# Patient Record
Sex: Male | Born: 1970 | Race: White | Hispanic: No | Marital: Married | Smoking: Former smoker
Health system: Southern US, Community
[De-identification: ages and names within clinical notes are randomized; demographics above are authoritative.]

## PROBLEM LIST (undated history)

## (undated) DIAGNOSIS — Z720 Tobacco use: Secondary | ICD-10-CM

## (undated) DIAGNOSIS — I10 Essential (primary) hypertension: Secondary | ICD-10-CM

## (undated) DIAGNOSIS — C801 Malignant (primary) neoplasm, unspecified: Secondary | ICD-10-CM

## (undated) DIAGNOSIS — R569 Unspecified convulsions: Secondary | ICD-10-CM

## (undated) DIAGNOSIS — J45909 Unspecified asthma, uncomplicated: Secondary | ICD-10-CM

## (undated) DIAGNOSIS — J449 Chronic obstructive pulmonary disease, unspecified: Secondary | ICD-10-CM

## (undated) DIAGNOSIS — E119 Type 2 diabetes mellitus without complications: Secondary | ICD-10-CM

---

## 2009-01-24 ENCOUNTER — Ambulatory Visit: Payer: Self-pay | Admitting: Internal Medicine

## 2009-02-13 ENCOUNTER — Emergency Department: Payer: Self-pay | Admitting: Emergency Medicine

## 2009-02-16 ENCOUNTER — Inpatient Hospital Stay: Payer: Self-pay | Admitting: Specialist

## 2009-03-26 ENCOUNTER — Ambulatory Visit: Payer: Self-pay | Admitting: Internal Medicine

## 2009-04-17 ENCOUNTER — Ambulatory Visit: Payer: Self-pay | Admitting: Internal Medicine

## 2009-04-26 ENCOUNTER — Ambulatory Visit: Payer: Self-pay | Admitting: Internal Medicine

## 2009-05-04 ENCOUNTER — Ambulatory Visit: Payer: Self-pay | Admitting: Specialist

## 2009-05-26 ENCOUNTER — Ambulatory Visit: Payer: Self-pay | Admitting: Internal Medicine

## 2009-06-26 ENCOUNTER — Ambulatory Visit: Payer: Self-pay | Admitting: Internal Medicine

## 2009-07-17 ENCOUNTER — Ambulatory Visit: Payer: Self-pay | Admitting: Internal Medicine

## 2009-07-26 ENCOUNTER — Ambulatory Visit: Payer: Self-pay | Admitting: Internal Medicine

## 2009-08-26 ENCOUNTER — Ambulatory Visit: Payer: Self-pay | Admitting: Internal Medicine

## 2009-09-18 ENCOUNTER — Ambulatory Visit: Payer: Self-pay | Admitting: Internal Medicine

## 2009-09-26 ENCOUNTER — Ambulatory Visit: Payer: Self-pay | Admitting: Internal Medicine

## 2009-11-17 ENCOUNTER — Ambulatory Visit: Payer: Self-pay | Admitting: Internal Medicine

## 2009-11-24 ENCOUNTER — Ambulatory Visit: Payer: Self-pay | Admitting: Internal Medicine

## 2010-02-23 ENCOUNTER — Ambulatory Visit: Payer: Self-pay | Admitting: Internal Medicine

## 2010-03-05 ENCOUNTER — Ambulatory Visit: Payer: Self-pay | Admitting: Internal Medicine

## 2010-03-26 ENCOUNTER — Ambulatory Visit: Payer: Self-pay | Admitting: Internal Medicine

## 2010-04-26 ENCOUNTER — Ambulatory Visit: Payer: Self-pay | Admitting: Internal Medicine

## 2010-05-07 ENCOUNTER — Ambulatory Visit: Payer: Self-pay | Admitting: Internal Medicine

## 2010-05-26 ENCOUNTER — Ambulatory Visit: Payer: Self-pay | Admitting: Internal Medicine

## 2010-06-28 ENCOUNTER — Emergency Department: Payer: Self-pay | Admitting: Emergency Medicine

## 2010-08-09 ENCOUNTER — Ambulatory Visit: Payer: Self-pay | Admitting: Internal Medicine

## 2010-08-22 ENCOUNTER — Emergency Department: Payer: Self-pay | Admitting: Emergency Medicine

## 2010-08-26 ENCOUNTER — Ambulatory Visit: Payer: Self-pay | Admitting: Internal Medicine

## 2010-10-10 ENCOUNTER — Ambulatory Visit: Payer: Self-pay | Admitting: Internal Medicine

## 2010-10-25 ENCOUNTER — Ambulatory Visit: Payer: Self-pay | Admitting: Internal Medicine

## 2011-01-17 ENCOUNTER — Ambulatory Visit: Payer: Self-pay | Admitting: Internal Medicine

## 2011-01-25 ENCOUNTER — Ambulatory Visit: Payer: Self-pay | Admitting: Internal Medicine

## 2011-05-09 ENCOUNTER — Ambulatory Visit: Payer: Self-pay | Admitting: Internal Medicine

## 2011-05-27 ENCOUNTER — Ambulatory Visit: Payer: Self-pay | Admitting: Internal Medicine

## 2011-09-13 ENCOUNTER — Ambulatory Visit: Payer: Self-pay | Admitting: Internal Medicine

## 2011-09-13 LAB — CBC CANCER CENTER
Basophil %: 0.4 %
Eosinophil #: 0.1 x10 3/mm (ref 0.0–0.7)
Eosinophil %: 1.7 %
HCT: 47.4 % (ref 40.0–52.0)
HGB: 16.3 g/dL (ref 13.0–18.0)
Lymphocyte #: 2.5 x10 3/mm (ref 1.0–3.6)
Lymphocyte %: 30 %
MCH: 29.8 pg (ref 26.0–34.0)
Monocyte #: 0.6 x10 3/mm (ref 0.0–0.7)
Monocyte %: 7.3 %
Neutrophil %: 60.6 %
RBC: 5.46 10*6/uL (ref 4.40–5.90)
WBC: 8.3 x10 3/mm (ref 3.8–10.6)

## 2011-09-13 LAB — LACTATE DEHYDROGENASE: LDH: 140 U/L (ref 87–241)

## 2011-09-13 LAB — CREATININE, SERUM
Creatinine: 0.92 mg/dL (ref 0.60–1.30)
EGFR (African American): >60

## 2011-09-27 ENCOUNTER — Ambulatory Visit: Payer: Self-pay | Admitting: Internal Medicine

## 2012-07-05 ENCOUNTER — Inpatient Hospital Stay: Payer: Self-pay | Admitting: Internal Medicine

## 2012-07-05 LAB — COMPREHENSIVE METABOLIC PANEL
Albumin: 3.9 g/dL (ref 3.4–5.0)
Alkaline Phosphatase: 123 U/L (ref 50–136)
Anion Gap: 11 (ref 7–16)
Bilirubin,Total: 0.3 mg/dL (ref 0.2–1.0)
Calcium, Total: 9.7 mg/dL (ref 8.5–10.1)
Chloride: 97 mmol/L — ABNORMAL LOW (ref 98–107)
Co2: 26 mmol/L (ref 21–32)
Creatinine: 0.92 mg/dL (ref 0.60–1.30)
EGFR (African American): >60
EGFR (Non-African Amer.): >60
Glucose: 377 mg/dL — ABNORMAL HIGH (ref 65–99)
Osmolality: 283 (ref 275–301)
Potassium: 4 mmol/L (ref 3.5–5.1)
SGOT(AST): 25 U/L (ref 15–37)
SGPT (ALT): 36 U/L (ref 12–78)
Sodium: 134 mmol/L — ABNORMAL LOW (ref 136–145)

## 2012-07-05 LAB — URINALYSIS, COMPLETE
Bacteria: NONE SEEN
Bilirubin,UR: NEGATIVE
Blood: NEGATIVE
Glucose,UR: >=500 mg/dL (ref 0–75)
Leukocyte Esterase: NEGATIVE
RBC,UR: <1 /HPF (ref 0–5)
Squamous Epithelial: <1

## 2012-07-05 LAB — CBC
HGB: 18.4 g/dL — ABNORMAL HIGH (ref 13.0–18.0)
MCH: 29.8 pg (ref 26.0–34.0)
MCHC: 34.1 g/dL (ref 32.0–36.0)
MCV: 87 fL (ref 80–100)
Platelet: 230 10*3/uL (ref 150–440)
RBC: 6.19 10*6/uL — ABNORMAL HIGH (ref 4.40–5.90)

## 2012-07-05 LAB — LIPASE, BLOOD: Lipase: >3000 U/L (ref 73–393)

## 2012-07-06 LAB — CBC WITH DIFFERENTIAL/PLATELET
Basophil #: 0.1 10*3/uL (ref 0.0–0.1)
Basophil %: 0.6 %
Eosinophil %: 2.5 %
HGB: 16 g/dL (ref 13.0–18.0)
Lymphocyte %: 26.6 %
MCH: 30.3 pg (ref 26.0–34.0)
MCV: 87 fL (ref 80–100)
Monocyte #: 0.9 x10 3/mm (ref 0.2–1.0)
Neutrophil #: 7.6 10*3/uL — ABNORMAL HIGH (ref 1.4–6.5)
Neutrophil %: 62.8 %
RBC: 5.27 10*6/uL (ref 4.40–5.90)
RDW: 12.9 % (ref 11.5–14.5)
WBC: 12.1 10*3/uL — ABNORMAL HIGH (ref 3.8–10.6)

## 2012-07-06 LAB — COMPREHENSIVE METABOLIC PANEL
Albumin: 3.2 g/dL — ABNORMAL LOW (ref 3.4–5.0)
Alkaline Phosphatase: 96 U/L (ref 50–136)
Anion Gap: 8 (ref 7–16)
Co2: 25 mmol/L (ref 21–32)
Creatinine: 0.62 mg/dL (ref 0.60–1.30)
Glucose: 143 mg/dL — ABNORMAL HIGH (ref 65–99)
Osmolality: 276 (ref 275–301)
SGOT(AST): 31 U/L (ref 15–37)
SGPT (ALT): 30 U/L (ref 12–78)
Sodium: 137 mmol/L (ref 136–145)
Total Protein: 7.1 g/dL (ref 6.4–8.2)

## 2012-07-06 LAB — LIPASE, BLOOD: Lipase: 2943 U/L — ABNORMAL HIGH (ref 73–393)

## 2012-07-06 LAB — LIPID PANEL
Ldl Cholesterol, Calc: 117 mg/dL — ABNORMAL HIGH (ref 0–100)
Triglycerides: 398 mg/dL — ABNORMAL HIGH (ref 0–200)

## 2012-07-06 LAB — AMYLASE: Amylase: 225 U/L — ABNORMAL HIGH (ref 25–115)

## 2012-07-07 LAB — CBC WITH DIFFERENTIAL/PLATELET
Basophil #: 0.1 10*3/uL (ref 0.0–0.1)
Eosinophil #: 0.2 10*3/uL (ref 0.0–0.7)
HCT: 44.5 % (ref 40.0–52.0)
Lymphocyte #: 3.2 10*3/uL (ref 1.0–3.6)
Lymphocyte %: 33.5 %
MCHC: 35.2 g/dL (ref 32.0–36.0)
MCV: 87 fL (ref 80–100)
Neutrophil #: 5.2 10*3/uL (ref 1.4–6.5)
Neutrophil %: 55.6 %
Platelet: 187 10*3/uL (ref 150–440)
RBC: 5.1 10*6/uL (ref 4.40–5.90)
RDW: 12.7 % (ref 11.5–14.5)

## 2012-07-07 LAB — LIPASE, BLOOD: Lipase: 1486 U/L — ABNORMAL HIGH (ref 73–393)

## 2014-05-10 ENCOUNTER — Ambulatory Visit: Payer: Self-pay

## 2014-12-13 NOTE — H&P (Signed)
PATIENT NAME:  Larry Lam, PUMPHREY MR#:  [id number] DATE OF BIRTH:  [birth year]  DATE OF ADMISSION:  07/05/2012  CHIEF COMPLAINT: Epigastric abdominal pain radiating to back.   HISTORY OF PRESENT ILLNESS: The patient is a 44 year old male with a past medical history of COPD, diabetes mellitus, hyperlipidemia and hypertension who is presenting to the ER with a chief complaint of epigastric abdominal pain. The patient is reporting that the pain started two days ago and is radiating to the midback.His pain is 8 to 9 out of 10. Epigastric pain is sharp in nature, 8 out of 10, radiating to the back, not associated with nausea or vomiting. He admits drinking alcohol 1 to 2 times in a month. The patient had cholecystectomy in the past and abdominal ultrasound in the ER did not show any acute findings.   PAST MEDICAL HISTORY:  1. Chronic obstructive pulmonary disease. The patient is supposed to take 2 liters of oxygen during nighttime but he is not using that. 2. Non-insulin-dependent diabetes mellitus. 3. Hypertension. 4. Hyperlipidemia.   PAST SURGICAL HISTORY: Cholecystectomy.   ALLERGIES: No known drug allergies.   HOME MEDICATIONS: 1. Spiriva 18 mcg inhalation 1 capsule once a day.  2. Omeprazole 20 mg p.o. once daily. 3. Quinapril 40 mg 2 tablets once a day. 4. Metformin 1000 mg 1 tablet p.o. b.i.d.  5. Lovastatin 10 mg once a day. 6. Amlodipine 10 mg p.o. b.i.d.  7. Advair Diskus 1 puff inhalation 2 times a day.   PSYCHOSOCIAL HISTORY: Lives at home.  FAMILY HISTORY: Grandfather had history of emphysema and cancer.   REVIEW OF SYSTEMS: CONSTITUTIONAL: Denies any fever or fatigue. Complaining of weakness but denies any pain, weight loss or weight gain. EYES: Denies any blurry vision, pain or redness. Denies any inflammation. ENT: Denies tinnitus, ear pain, hearing loss, postnasal drip. RESPIRATORY: Denies any cough, orthopnea, edema, palpitations, syncope, high blood pressure. GI: Denies  nausea, vomiting, or diarrhea. Positive epigastric abdominal pain 8 out of 10 radiating to the back. Denies any hematemesis or melena. GENITOURINARY: Denies dysuria, hematuria, renal calculi. ENDOCRINE: Denies polyuria, polydipsia, polyarthria. Denies any thyroid problems. HEMATOLOGIC/LYMPHATIC: Denies any anemia, erythema, bleeding, swollen glands. INTEGUMENTARY: Denies acne, rash, lesions. MUSCULOSKELETAL: Denies any pain in the neck. Complaining of back pain. NEUROLOGIC: Denies any numbness, weakness, dysarthria, dementia, TIA.   PHYSICAL EXAMINATION:   VITAL SIGNS: Temperature 98.1, pulse 104, respiratory rate 18, blood pressure 162/106.  GENERAL APPEARANCE: Not in acute distress. Answering questions appropriately. Well built, well nourished.   HEENT: Normocephalic, atraumatic. Pupils are equally reacting to light and accommodation.   NECK: Supple. No JVD. No adenopathy.   LUNGS: Clear to auscultation bilaterally.   CARDIOVASCULAR: S2 normal. Regular rate and rhythm. Positive epigastric tenderness. No rebound tenderness. No masses felt.   GI: Soft. Bowel sounds are positive in all four quadrants.  NEUROLOGIC: Alert and oriented x3. Reflexes are 2+. Cranial nerves II to XII are intact.   EXTREMITIES: No edema. No cyanosis. No lesions.   LABS AND IMAGING STUDIES: Limited abdominal ultrasound has revealed no current signs of cholelithiasis, acute cholecystitis.  Serum glucose 377, BUN 11, creatinine 0.92, sodium 134, potassium 4.0, chloride 97, CO2 26, WBC 13,400, hemoglobin 18.4, hematocrit 54.1, platelet count 230,000. Urinalysis straw color, clear clarity, nitrites negative, leukocyte esterase negative.   ASSESSMENT AND PLAN: This is a 44 year old male presenting to the ER with chief complaint of epigastric abdominal pain 8 out of 10 radiating to back, recently diagnosed with an elevated lipase  who will be admitted with the following assessment and plan:  1. Acute pancreatitis with  elevated lipase level. Will make him n.p.o. Provide him IV fluids. Will provide GI prophylaxis with Protonix, antinausea medication with Zofran on an as needed basis. The etiology of acute pancreatitis is somewhat unclear as the patient had history of cholecystectomy. Except for lipase, LFTs are not elevated. Other differential can be from hypertriglyceridemia, drug induced or acute phase reactant. 2. History of COPD, stable at this time. I will provide him with treatment as needed.  3. Diabetes mellitus. The patient will be on sliding scale with Accu-Chek coverage. Will hold off on his home medication while patient is n.p.o.  4. Hypertension. Monitor blood pressures and titrate medications on an as needed basis.  5. Hyperlipidemia. Will check fasting lipid panel in a.m.  6. GI and DVT prophylaxis.  The diagnosis and plan of care was discussed with the patient and his wife at bedside.   TOTAL TIME SPENT ON THE ADMISSION: 45 minutes.   ____________________________ Nicholes Mango, MD ag:drc D: 07/05/2012 04:54:46 ET T: 07/05/2012 10:59:13 ET JOB#: [id number]  cc: Nicholes Mango, MD, <Dictator> Nicholes Mango MD ELECTRONICALLY SIGNED 07/06/2012 22:49

## 2014-12-13 NOTE — Discharge Summary (Signed)
PATIENT NAME:  Larry Lam, Larry Lam MR#:  [id number] DATE OF BIRTH:  11-25-1970  DATE OF ADMISSION:  07/05/2012 DATE OF DISCHARGE:  07/07/2012  PRIMARY CARE PHYSICIAN: Dr. Salome Holmes   DISCHARGE DIAGNOSES:  1. Acute pancreatitis. 2. Leukocytosis. 3. Hypertension. 4. Diabetes. 5. Hyperlipidemia.   CONDITION: Stable.   HOME MEDICATIONS:  1. Quinapril 40 mg 2 tablets p.o. once a day. 2. Albuterol CFC free 90 mcg inhaled.  3. Advair 1 puff b.i.d. 100 mcg/50 mcg.  4. Metformin 1000 mg p.o. b.i.d.  5. Norvasc 10 mg p.o. b.i.d.  6. Spiriva 18 mcg inhalation, one cap once a day.  7. Lovastatin 10 mg p.o. once a day.  8. Omeprazole 20 mg p.o. once a day.  9. Fenofibrate 145 mg p.o. once a day.   DIET: Low sodium, low fat, low cholesterol, ADA diet.   ACTIVITY: As tolerated.   FOLLOW-UP CARE: Follow up with PCP within one week. Also patient was advised no fatty food or alcohol.   REASON FOR ADMISSION: Abdominal pain radiating to the back.   HOSPITAL COURSE: Patient is a 44 year old Caucasian male with history of chronic obstructive pulmonary disease, diabetes, hyperlipidemia, hypertension presented to the ED with chief complaint of epigastric abdominal pain radiating to the back, 10/10 with nausea, vomiting. For detailed history and physical examination, please refer to the admission note dictated by Dr. Margaretmary Eddy. On admission date patient's white count was 13,400, lipase more than 3000. Urinalysis is negative. Patient was admitted for acute pancreatitis. After admission patient has been treated with n.p.o. and clear liquid. In addition, patient has been treated with Protonix and Zofran and pain medication. Patient's symptoms have much improved after admission, however, patient's lipase was 2900 and today decreased to 1490 but patient has no symptoms at all. Patient is clinically stable, will be discharged to home today. In addition, patient's lipid panel showed hyperlipidemia. TG is about  390, has been treated with fenofibrate. Patient is clinically stable, will be discharged to home today.   Discussed the patient's discharge plan with patient and patient's wife, case Freight forwarder and nurse.   TIME SPENT: About 33 minutes.  ____________________________ Demetrios Loll, MD qc:cms D: 07/07/2012 15:01:58 ET T: 07/08/2012 10:58:51 ET JOB#: [id number]  cc: Demetrios Loll, MD, <Dictator> Salome Holmes, MD Demetrios Loll MD ELECTRONICALLY SIGNED 07/09/2012 14:12

## 2015-01-19 ENCOUNTER — Inpatient Hospital Stay
Admission: EM | Admit: 2015-01-19 | Discharge: 2015-01-19 | DRG: 101 | Disposition: A | Discharge status: Home / Self Care | Payer: Medicare Other | Attending: Internal Medicine | Admitting: Internal Medicine

## 2015-01-19 ENCOUNTER — Encounter: Payer: Self-pay | Admitting: Internal Medicine

## 2015-01-19 ENCOUNTER — Inpatient Hospital Stay: Payer: Medicare Other

## 2015-01-19 ENCOUNTER — Emergency Department: Payer: Medicare Other

## 2015-01-19 DIAGNOSIS — F1290 Cannabis use, unspecified, uncomplicated: Secondary | ICD-10-CM | POA: Diagnosis present

## 2015-01-19 DIAGNOSIS — J449 Chronic obstructive pulmonary disease, unspecified: Secondary | ICD-10-CM | POA: Diagnosis present

## 2015-01-19 DIAGNOSIS — I10 Essential (primary) hypertension: Secondary | ICD-10-CM | POA: Diagnosis present

## 2015-01-19 DIAGNOSIS — G40909 Epilepsy, unspecified, not intractable, without status epilepticus: Secondary | ICD-10-CM

## 2015-01-19 DIAGNOSIS — F17210 Nicotine dependence, cigarettes, uncomplicated: Secondary | ICD-10-CM | POA: Diagnosis present

## 2015-01-19 DIAGNOSIS — E119 Type 2 diabetes mellitus without complications: Secondary | ICD-10-CM | POA: Diagnosis present

## 2015-01-19 DIAGNOSIS — E876 Hypokalemia: Secondary | ICD-10-CM | POA: Diagnosis present

## 2015-01-19 DIAGNOSIS — D72829 Elevated white blood cell count, unspecified: Secondary | ICD-10-CM | POA: Diagnosis present

## 2015-01-19 DIAGNOSIS — R569 Unspecified convulsions: Secondary | ICD-10-CM | POA: Diagnosis present

## 2015-01-19 HISTORY — DX: Chronic obstructive pulmonary disease, unspecified: J44.9

## 2015-01-19 HISTORY — DX: Type 2 diabetes mellitus without complications: E11.9

## 2015-01-19 HISTORY — DX: Essential (primary) hypertension: I10

## 2015-01-19 LAB — CBC
HCT: 58.3 % — ABNORMAL HIGH (ref 40.0–52.0)
Hemoglobin: 19.6 g/dL — ABNORMAL HIGH (ref 13.0–18.0)
MCH: 29.3 pg (ref 26.0–34.0)
MCHC: 33.6 g/dL (ref 32.0–36.0)
MCV: 87.3 fL (ref 80.0–100.0)
PLATELETS: 221 10*3/uL (ref 150–440)
RBC: 6.67 MIL/uL — ABNORMAL HIGH (ref 4.40–5.90)
RDW: 13.2 % (ref 11.5–14.5)
WBC: 17 10*3/uL — ABNORMAL HIGH (ref 3.8–10.6)

## 2015-01-19 LAB — COMPREHENSIVE METABOLIC PANEL
ALT: 14 U/L — ABNORMAL LOW (ref 17–63)
ANION GAP: 13 (ref 5–15)
AST: 19 U/L (ref 15–41)
Albumin: 4.5 g/dL (ref 3.5–5.0)
Alkaline Phosphatase: 84 U/L (ref 38–126)
BILIRUBIN TOTAL: 0.5 mg/dL (ref 0.3–1.2)
BUN: 6 mg/dL (ref 6–20)
CO2: 24 mmol/L (ref 22–32)
Calcium: 9.6 mg/dL (ref 8.9–10.3)
Chloride: 100 mmol/L — ABNORMAL LOW (ref 101–111)
Creatinine, Ser: 1.1 mg/dL (ref 0.61–1.24)
GFR calc Af Amer: >60 mL/min (ref >60)
GLUCOSE: 226 mg/dL — AB (ref 65–99)
POTASSIUM: 3 mmol/L — AB (ref 3.5–5.1)
SODIUM: 137 mmol/L (ref 135–145)
Total Protein: 8.3 g/dL — ABNORMAL HIGH (ref 6.5–8.1)

## 2015-01-19 LAB — GLUCOSE, CAPILLARY
Glucose-Capillary: 230 mg/dL — ABNORMAL HIGH (ref 65–99)
Glucose-Capillary: 106 mg/dL — ABNORMAL HIGH (ref 65–99)

## 2015-01-19 LAB — URINE DRUG SCREEN, QUALITATIVE (ARMC ONLY)
Amphetamines, Ur Screen: NOT DETECTED
Barbiturates, Ur Screen: NOT DETECTED
Benzodiazepine, Ur Scrn: POSITIVE — AB
CANNABINOID 50 NG, UR ~~LOC~~: POSITIVE — AB
COCAINE METABOLITE, UR ~~LOC~~: NOT DETECTED
MDMA (Ecstasy)Ur Screen: NOT DETECTED
METHADONE SCREEN, URINE: NOT DETECTED
OPIATE, UR SCREEN: NOT DETECTED
PHENCYCLIDINE (PCP) UR S: NOT DETECTED
TRICYCLIC, UR SCREEN: NOT DETECTED

## 2015-01-19 LAB — HEMOGLOBIN A1C: HEMOGLOBIN A1C: 7.1 % — AB (ref 4.0–6.0)

## 2015-01-19 LAB — MAGNESIUM: Magnesium: 1.7 mg/dL (ref 1.7–2.4)

## 2015-01-19 LAB — LIPASE, BLOOD: Lipase: 40 U/L (ref 22–51)

## 2015-01-19 LAB — TROPONIN I

## 2015-01-19 LAB — ETHANOL

## 2015-01-19 MED ORDER — GADOBENATE DIMEGLUMINE 529 MG/ML IV SOLN
20.0000 mL | Freq: Once | INTRAVENOUS | Status: AC
  Administered 2015-01-19: 20 mL via INTRAVENOUS

## 2015-01-19 MED ORDER — ASPIRIN EC 81 MG PO TBEC
81.0000 mg | DELAYED_RELEASE_TABLET | Freq: Every day | ORAL | Status: DC
Start: 2015-01-19 — End: 2015-01-19
  Administered 2015-01-19: 81 mg via ORAL
  Filled 2015-01-19: qty 1

## 2015-01-19 MED ORDER — ASPIRIN 81 MG PO TBEC: 81.0000 mg | DELAYED_RELEASE_TABLET | Freq: Every day | ORAL | Status: DC

## 2015-01-19 MED ORDER — LISINOPRIL 20 MG PO TABS
40.0000 mg | ORAL_TABLET | Freq: Every day | ORAL | Status: DC
  Administered 2015-01-19: 40 mg via ORAL
  Filled 2015-01-19: qty 2

## 2015-01-19 MED ORDER — ONDANSETRON HCL 4 MG/2ML IJ SOLN: 4.0000 mg | Freq: Four times a day (QID) | INTRAMUSCULAR | Status: DC | PRN

## 2015-01-19 MED ORDER — HYDRALAZINE HCL 20 MG/ML IJ SOLN: 10.0000 mg | INTRAMUSCULAR | Status: DC | PRN

## 2015-01-19 MED ORDER — LORAZEPAM 2 MG/ML IJ SOLN: 0.5000 mg | INTRAMUSCULAR | Status: DC

## 2015-01-19 MED ORDER — LORAZEPAM 2 MG/ML IJ SOLN
1.0000 mg | Freq: Once | INTRAMUSCULAR | Status: AC
  Administered 2015-01-19: 1 mg via INTRAVENOUS

## 2015-01-19 MED ORDER — LEVETIRACETAM 500 MG PO TABS
500.0000 mg | ORAL_TABLET | Freq: Two times a day (BID) | ORAL | Status: DC
  Administered 2015-01-19: 500 mg via ORAL
  Filled 2015-01-19: qty 1

## 2015-01-19 MED ORDER — NON FORMULARY: 40.0000 mg | Freq: Every morning | Status: DC

## 2015-01-19 MED ORDER — ACETAMINOPHEN 325 MG PO TABS: 650.0000 mg | ORAL_TABLET | Freq: Four times a day (QID) | ORAL | Status: DC | PRN

## 2015-01-19 MED ORDER — SODIUM CHLORIDE 0.9 % IV SOLN
Freq: Once | INTRAVENOUS | Status: AC
  Administered 2015-01-19: 1000 mL via INTRAVENOUS

## 2015-01-19 MED ORDER — SODIUM CHLORIDE 0.9 % IJ SOLN: 3.0000 mL | INTRAMUSCULAR | Status: DC | PRN

## 2015-01-19 MED ORDER — LORAZEPAM 2 MG/ML IJ SOLN: 1.0000 mg | INTRAMUSCULAR | Status: DC | PRN

## 2015-01-19 MED ORDER — ALBUTEROL SULFATE HFA 108 (90 BASE) MCG/ACT IN AERS: 2.0000 | INHALATION_SPRAY | Freq: Four times a day (QID) | RESPIRATORY_TRACT | Status: DC | PRN

## 2015-01-19 MED ORDER — METFORMIN HCL 500 MG PO TABS: 500.0000 mg | ORAL_TABLET | Freq: Two times a day (BID) | ORAL | Status: DC

## 2015-01-19 MED ORDER — ENOXAPARIN SODIUM 40 MG/0.4ML ~~LOC~~ SOLN
40.0000 mg | SUBCUTANEOUS | Status: DC
  Administered 2015-01-19: 40 mg via SUBCUTANEOUS
  Filled 2015-01-19: qty 0.4

## 2015-01-19 MED ORDER — INSULIN ASPART 100 UNIT/ML ~~LOC~~ SOLN: 0.0000 [IU] | Freq: Three times a day (TID) | SUBCUTANEOUS | Status: DC

## 2015-01-19 MED ORDER — ALBUTEROL SULFATE (2.5 MG/3ML) 0.083% IN NEBU
2.5000 mg | INHALATION_SOLUTION | Freq: Four times a day (QID) | RESPIRATORY_TRACT | Status: DC | PRN
  Administered 2015-01-19: 2.5 mg via RESPIRATORY_TRACT

## 2015-01-19 MED ORDER — POTASSIUM CHLORIDE CRYS ER 20 MEQ PO TBCR
40.0000 meq | EXTENDED_RELEASE_TABLET | Freq: Once | ORAL | Status: AC
  Administered 2015-01-19: 40 meq via ORAL
  Filled 2015-01-19: qty 2

## 2015-01-19 MED ORDER — IPRATROPIUM-ALBUTEROL 0.5-2.5 (3) MG/3ML IN SOLN
3.0000 mL | RESPIRATORY_TRACT | Status: DC
  Administered 2015-01-19: 3 mL via RESPIRATORY_TRACT
  Filled 2015-01-19: qty 3

## 2015-01-19 MED ORDER — SENNOSIDES-DOCUSATE SODIUM 8.6-50 MG PO TABS: 1.0000 | ORAL_TABLET | Freq: Every evening | ORAL | Status: DC | PRN

## 2015-01-19 MED ORDER — LORAZEPAM 2 MG/ML IJ SOLN
INTRAMUSCULAR | Status: AC
  Administered 2015-01-19: 1 mg via INTRAVENOUS
  Filled 2015-01-19: qty 1

## 2015-01-19 MED ORDER — SODIUM CHLORIDE 0.9 % IV SOLN: 250.0000 mL | INTRAVENOUS | Status: DC | PRN

## 2015-01-19 MED ORDER — LEVETIRACETAM 500 MG PO TABS
500.0000 mg | ORAL_TABLET | Freq: Two times a day (BID) | ORAL | Status: DC
Start: 2015-01-19 — End: 2015-07-28

## 2015-01-19 MED ORDER — SODIUM CHLORIDE 0.9 % IV SOLN
1000.0000 mg | Freq: Once | INTRAVENOUS | Status: AC
  Administered 2015-01-19: 1000 mg via INTRAVENOUS
  Filled 2015-01-19: qty 10

## 2015-01-19 MED ORDER — ACETAMINOPHEN 650 MG RE SUPP: 650.0000 mg | Freq: Four times a day (QID) | RECTAL | Status: DC | PRN

## 2015-01-19 MED ORDER — LORAZEPAM 2 MG/ML IJ SOLN
INTRAMUSCULAR | Status: AC
  Administered 2015-01-19: 10:00:00
  Filled 2015-01-19: qty 1

## 2015-01-19 MED ORDER — SODIUM CHLORIDE 0.9 % IJ SOLN
3.0000 mL | Freq: Two times a day (BID) | INTRAMUSCULAR | Status: DC
  Administered 2015-01-19: 3 mL via INTRAVENOUS

## 2015-01-19 MED ORDER — LISINOPRIL 40 MG PO TABS: 40.0000 mg | ORAL_TABLET | Freq: Every day | ORAL | Status: DC

## 2015-01-19 MED ORDER — ONDANSETRON HCL 4 MG PO TABS: 4.0000 mg | ORAL_TABLET | Freq: Four times a day (QID) | ORAL | Status: DC | PRN

## 2015-01-19 NOTE — Discharge Instructions (Addendum)
°  DIET:  Diabetic diet  DISCHARGE CONDITION:  Stable  ACTIVITY:  Activity as tolerated  OXYGEN:  Home Oxygen: No.   Oxygen Delivery: room air  DISCHARGE LOCATION:  home   If you experience worsening of your admission symptoms, develop shortness of breath, life threatening emergency, suicidal or homicidal thoughts you must seek medical attention immediately by calling 911 or calling your MD immediately  if symptoms less severe.  You Must read complete instructions/literature along with all the possible adverse reactions/side effects for all the Medicines you take and that have been prescribed to you. Take any new Medicines after you have completely understood and accpet all the possible adverse reactions/side effects.   Follow up with your PCP in 1 week  DO NOT DRIVE FOR 1 WEEK.  DO NOT USE ANY ADDERRAL OR MARIJUANA. CAN CAUSE SEIZURES.  Please note  You were cared for by a hospitalist during your hospital stay. If you have any questions about your discharge medications or the care you received while you were in the hospital after you are discharged, you can call the unit and asked to speak with the hospitalist on call if the hospitalist that took care of you is not available. Once you are discharged, your primary care physician will handle any further medical issues. Please note that NO REFILLS for any discharge medications will be authorized once you are discharged, as it is imperative that you return to your primary care physician (or establish a relationship with a primary care physician if you do not have one) for your aftercare needs so that they can reassess your need for medications and monitor your lab values.

## 2015-01-19 NOTE — Progress Notes (Signed)
Discharge instructions along with home medication  List and follow up gone over with patient and daughter, both verbalized that they understood instructions. Printed rx given to patient. Patient is a@o  no seizure like activity noted. Iv removed x2. Telemetry removed. Patient discharged home on ra, no distress noted. Expressed the importance of calling for follow up appt and taking prescribed medications. Explained to patient he is not to drive for 1 week. Patient discharged home [facility]
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
## 2015-07-16 ENCOUNTER — Emergency Department
Admission: EM | Admit: 2015-07-16 | Discharge: 2015-07-16 | Discharge status: Against Medical Advice | Payer: Medicare Other | Attending: Emergency Medicine | Admitting: Emergency Medicine

## 2015-07-16 ENCOUNTER — Encounter: Payer: Self-pay | Admitting: Emergency Medicine

## 2015-07-16 DIAGNOSIS — J441 Chronic obstructive pulmonary disease with (acute) exacerbation: Secondary | ICD-10-CM | POA: Insufficient documentation

## 2015-07-16 DIAGNOSIS — E119 Type 2 diabetes mellitus without complications: Secondary | ICD-10-CM | POA: Insufficient documentation

## 2015-07-16 DIAGNOSIS — F17210 Nicotine dependence, cigarettes, uncomplicated: Secondary | ICD-10-CM | POA: Insufficient documentation

## 2015-07-16 DIAGNOSIS — I10 Essential (primary) hypertension: Secondary | ICD-10-CM | POA: Insufficient documentation

## 2015-07-16 DIAGNOSIS — R0602 Shortness of breath: Secondary | ICD-10-CM | POA: Diagnosis present

## 2015-07-16 NOTE — ED Notes (Signed)
Pt removed from board after being called multiple times to be taken back to a room since checking in

## 2015-07-16 NOTE — ED Notes (Signed)
States he developed some sob yesterday and chest congestion  Denies any chest pain  Hx of COPD

## 2015-07-28 ENCOUNTER — Emergency Department
Admission: EM | Admit: 2015-07-28 | Discharge: 2015-07-28 | Disposition: A | Discharge status: Home / Self Care | Payer: Medicare Other | Attending: Emergency Medicine | Admitting: Emergency Medicine

## 2015-07-28 ENCOUNTER — Emergency Department: Payer: Medicare Other

## 2015-07-28 ENCOUNTER — Encounter: Payer: Self-pay | Admitting: General Practice

## 2015-07-28 DIAGNOSIS — W2209XA Striking against other stationary object, initial encounter: Secondary | ICD-10-CM | POA: Insufficient documentation

## 2015-07-28 DIAGNOSIS — E119 Type 2 diabetes mellitus without complications: Secondary | ICD-10-CM | POA: Insufficient documentation

## 2015-07-28 DIAGNOSIS — T148XXA Other injury of unspecified body region, initial encounter: Secondary | ICD-10-CM

## 2015-07-28 DIAGNOSIS — M25562 Pain in left knee: Secondary | ICD-10-CM

## 2015-07-28 DIAGNOSIS — F17210 Nicotine dependence, cigarettes, uncomplicated: Secondary | ICD-10-CM | POA: Insufficient documentation

## 2015-07-28 DIAGNOSIS — S79922A Unspecified injury of left thigh, initial encounter: Secondary | ICD-10-CM | POA: Diagnosis present

## 2015-07-28 DIAGNOSIS — S7012XA Contusion of left thigh, initial encounter: Secondary | ICD-10-CM | POA: Insufficient documentation

## 2015-07-28 DIAGNOSIS — Y998 Other external cause status: Secondary | ICD-10-CM | POA: Insufficient documentation

## 2015-07-28 DIAGNOSIS — Y9389 Activity, other specified: Secondary | ICD-10-CM | POA: Diagnosis not present

## 2015-07-28 DIAGNOSIS — Y9289 Other specified places as the place of occurrence of the external cause: Secondary | ICD-10-CM | POA: Diagnosis not present

## 2015-07-28 DIAGNOSIS — I10 Essential (primary) hypertension: Secondary | ICD-10-CM | POA: Insufficient documentation

## 2015-07-28 LAB — GLUCOSE, CAPILLARY: Glucose-Capillary: 153 mg/dL — ABNORMAL HIGH (ref 65–99)

## 2015-07-28 MED ORDER — METFORMIN HCL 500 MG PO TABS: 500.0000 mg | ORAL_TABLET | Freq: Every day | ORAL | Status: DC

## 2015-07-28 MED ORDER — LEVETIRACETAM 500 MG PO TABS: 500.0000 mg | ORAL_TABLET | Freq: Two times a day (BID) | ORAL | Status: DC

## 2015-07-28 MED ORDER — LISINOPRIL 40 MG PO TABS: 40.0000 mg | ORAL_TABLET | Freq: Every day | ORAL | Status: DC

## 2015-07-28 MED ORDER — ASPIRIN 81 MG PO TBEC: 81.0000 mg | DELAYED_RELEASE_TABLET | Freq: Every day | ORAL | Status: DC

## 2015-07-28 NOTE — Discharge Instructions (Signed)
Hypertension Hypertension, commonly called high blood pressure, is when the force of blood pumping through your arteries is too strong. Your arteries are the blood vessels that carry blood from your heart throughout your body. A blood pressure reading consists of a higher number over a lower number, such as 110/72. The higher number (systolic) is the pressure inside your arteries when your heart pumps. The lower number (diastolic) is the pressure inside your arteries when your heart relaxes. Ideally you want your blood pressure below 120/80. Hypertension forces your heart to work harder to pump blood. Your arteries may become narrow or stiff. Having untreated or uncontrolled hypertension can cause heart attack, stroke, kidney disease, and other problems. RISK FACTORS Some risk factors for high blood pressure are controllable. Others are not.  Risk factors you cannot control include:   Race. You may be at higher risk if you are African American.  Age. Risk increases with age.  Gender. Men are at higher risk than women before age 45 years. After age 65, women are at higher risk than men. Risk factors you can control include:  Not getting enough exercise or physical activity.  Being overweight.  Getting too much fat, sugar, calories, or salt in your diet.  Drinking too much alcohol. SIGNS AND SYMPTOMS Hypertension does not usually cause signs or symptoms. Extremely high blood pressure (hypertensive crisis) may cause headache, anxiety, shortness of breath, and nosebleed. DIAGNOSIS To check if you have hypertension, your health care provider will measure your blood pressure while you are seated, with your arm held at the level of your heart. It should be measured at least twice using the same arm. Certain conditions can cause a difference in blood pressure between your right and left arms. A blood pressure reading that is higher than normal on one occasion does not mean that you need treatment. If  it is not clear whether you have high blood pressure, you may be asked to return on a different day to have your blood pressure checked again. Or, you may be asked to monitor your blood pressure at home for 1 or more weeks. TREATMENT Treating high blood pressure includes making lifestyle changes and possibly taking medicine. Living a healthy lifestyle can help lower high blood pressure. You may need to change some of your habits. Lifestyle changes may include:  Following the DASH diet. This diet is high in fruits, vegetables, and whole grains. It is low in salt, red meat, and added sugars.  Keep your sodium intake below 2,300 mg per day.  Getting at least 30-45 minutes of aerobic exercise at least 4 times per week.  Losing weight if necessary.  Not smoking.  Limiting alcoholic beverages.  Learning ways to reduce stress. Your health care provider may prescribe medicine if lifestyle changes are not enough to get your blood pressure under control, and if one of the following is true:  You are 18-59 years of age and your systolic blood pressure is above 140.  You are 60 years of age or older, and your systolic blood pressure is above 150.  Your diastolic blood pressure is above 90.  You have diabetes, and your systolic blood pressure is over 140 or your diastolic blood pressure is over 90.  You have kidney disease and your blood pressure is above 140/90.  You have heart disease and your blood pressure is above 140/90. Your personal target blood pressure may vary depending on your medical conditions, your age, and other factors. HOME CARE INSTRUCTIONS    Have your blood pressure rechecked as directed by your health care provider.   Take medicines only as directed by your health care provider. Follow the directions carefully. Blood pressure medicines must be taken as prescribed. The medicine does not work as well when you skip doses. Skipping doses also puts you at risk for  problems.  Do not smoke.   Monitor your blood pressure at home as directed by your health care provider. SEEK MEDICAL CARE IF:   You think you are having a reaction to medicines taken.  You have recurrent headaches or feel dizzy.  You have swelling in your ankles.  You have trouble with your vision. SEEK IMMEDIATE MEDICAL CARE IF:  You develop a severe headache or confusion.  You have unusual weakness, numbness, or feel faint.  You have severe chest or abdominal pain.  You vomit repeatedly.  You have trouble breathing. MAKE SURE YOU:   Understand these instructions.  Will watch your condition.  Will get help right away if you are not doing well or get worse.   This information is not intended to replace advice given to you by your health care provider. Make sure you discuss any questions you have with your health care provider.   Document Released: 08/12/2005 Document Revised: 12/27/2014 Document Reviewed: 06/04/2013 Elsevier Interactive Patient Education 2016 [employer] Hematoma A hematoma is a collection of blood under the skin, in an organ, in a body space, in a joint space, or in other tissue. The blood can clot to form a lump that you can see and feel. The lump is often firm and may sometimes become sore and tender. Most hematomas get better in a few days to weeks. However, some hematomas may be serious and require medical care. Hematomas can range in size from very small to very large. CAUSES  A hematoma can be caused by a blunt or penetrating injury. It can also be caused by spontaneous leakage from a blood vessel under the skin. Spontaneous leakage from a blood vessel is more likely to occur in older people, especially those taking blood thinners. Sometimes, a hematoma can develop after certain medical procedures. SIGNS AND SYMPTOMS   A firm lump on the body.  Possible pain and tenderness in the area.  Bruising.Blue, dark blue, purple-red, or yellowish  skin may appear at the site of the hematoma if the hematoma is close to the surface of the skin. For hematomas in deeper tissues or body spaces, the signs and symptoms may be subtle. For example, an intra-abdominal hematoma may cause abdominal pain, weakness, fainting, and shortness of breath. An intracranial hematoma may cause a headache or symptoms such as weakness, trouble speaking, or a change in consciousness. DIAGNOSIS  A hematoma can usually be diagnosed based on your medical history and a physical exam. Imaging tests may be needed if your health care provider suspects a hematoma in deeper tissues or body spaces, such as the abdomen, head, or chest. These tests may include ultrasonography or a CT scan.  TREATMENT  Hematomas usually go away on their own over time. Rarely does the blood need to be drained out of the body. Large hematomas or those that may affect vital organs will sometimes need surgical drainage or monitoring. HOME CARE INSTRUCTIONS   Apply ice to the injured area:   Put ice in a plastic bag.   Place a towel between your skin and the bag.   Leave the ice on for 20 minutes, 2-3 times a day  for the first 1 to 2 days.   After the first 2 days, switch to using warm compresses on the hematoma.   Elevate the injured area to help decrease pain and swelling. Wrapping the area with an elastic bandage may also be helpful. Compression helps to reduce swelling and promotes shrinking of the hematoma. Make sure the bandage is not wrapped too tight.   If your hematoma is on a lower extremity and is painful, crutches may be helpful for a couple days.   Only take over-the-counter or prescription medicines as directed by your health care provider. SEEK IMMEDIATE MEDICAL CARE IF:   You have increasing pain, or your pain is not controlled with medicine.   You have a fever.   You have worsening swelling or discoloration.   Your skin over the hematoma breaks or starts  bleeding.   Your hematoma is in your chest or abdomen and you have weakness, shortness of breath, or a change in consciousness.  Your hematoma is on your scalp (caused by a fall or injury) and you have a worsening headache or a change in alertness or consciousness. MAKE SURE YOU:   Understand these instructions.  Will watch your condition.  Will get help right away if you are not doing well or get worse.   This information is not intended to replace advice given to you by your health care provider. Make sure you discuss any questions you have with your health care provider.   Document Released: 03/26/2004 Document Revised: 04/14/2013 Document Reviewed: 01/20/2013 Elsevier Interactive Patient Education [employer].
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
## 2015-07-28 NOTE — ED Notes (Signed)
Pt verbalized understanding of discharge orders. NAD at this time.  

## 2015-07-28 NOTE — ED Notes (Signed)
Resumed care from [place]. Pt alert & oriented with warm, dry skin. Pt BP 196/105. Discussed BP management with pt. He states that he has been on medication in the past but has not taken it in last 5-6 months.

## 2015-12-11 ENCOUNTER — Emergency Department (HOSPITAL_COMMUNITY)
Admission: EM | Admit: 2015-12-11 | Discharge: 2015-12-11 | Disposition: A | Discharge status: Home / Self Care | Payer: Medicare Other | Attending: Emergency Medicine | Admitting: Emergency Medicine

## 2015-12-11 ENCOUNTER — Encounter (HOSPITAL_COMMUNITY): Payer: Self-pay | Admitting: Emergency Medicine

## 2015-12-11 ENCOUNTER — Emergency Department (HOSPITAL_COMMUNITY): Payer: Medicare Other

## 2015-12-11 DIAGNOSIS — W228XXA Striking against or struck by other objects, initial encounter: Secondary | ICD-10-CM | POA: Insufficient documentation

## 2015-12-11 DIAGNOSIS — Y929 Unspecified place or not applicable: Secondary | ICD-10-CM | POA: Insufficient documentation

## 2015-12-11 DIAGNOSIS — Y939 Activity, unspecified: Secondary | ICD-10-CM | POA: Diagnosis not present

## 2015-12-11 DIAGNOSIS — D1779 Benign lipomatous neoplasm of other sites: Secondary | ICD-10-CM | POA: Insufficient documentation

## 2015-12-11 DIAGNOSIS — F17210 Nicotine dependence, cigarettes, uncomplicated: Secondary | ICD-10-CM | POA: Diagnosis not present

## 2015-12-11 DIAGNOSIS — M19141 Post-traumatic osteoarthritis, right hand: Secondary | ICD-10-CM | POA: Insufficient documentation

## 2015-12-11 DIAGNOSIS — J449 Chronic obstructive pulmonary disease, unspecified: Secondary | ICD-10-CM | POA: Insufficient documentation

## 2015-12-11 DIAGNOSIS — Z79899 Other long term (current) drug therapy: Secondary | ICD-10-CM | POA: Diagnosis not present

## 2015-12-11 DIAGNOSIS — I10 Essential (primary) hypertension: Secondary | ICD-10-CM | POA: Insufficient documentation

## 2015-12-11 DIAGNOSIS — D1721 Benign lipomatous neoplasm of skin and subcutaneous tissue of right arm: Secondary | ICD-10-CM

## 2015-12-11 DIAGNOSIS — E119 Type 2 diabetes mellitus without complications: Secondary | ICD-10-CM | POA: Insufficient documentation

## 2015-12-11 DIAGNOSIS — Y999 Unspecified external cause status: Secondary | ICD-10-CM | POA: Diagnosis not present

## 2015-12-11 DIAGNOSIS — S6991XA Unspecified injury of right wrist, hand and finger(s), initial encounter: Secondary | ICD-10-CM | POA: Diagnosis present

## 2015-12-11 MED ORDER — NAPROXEN 375 MG PO TABS: 375.0000 mg | ORAL_TABLET | Freq: Two times a day (BID) | ORAL | Status: DC | PRN

## 2015-12-11 NOTE — Discharge Instructions (Signed)
Lipoma A lipoma is a noncancerous (benign) tumor that is made up of fat cells. This is a very common type of soft-tissue growth. Lipomas are usually found under the skin (subcutaneous). They may occur in any tissue of the body that contains fat. Common areas for lipomas to appear include the back, shoulders, buttocks, and thighs. Lipomas grow slowly, and they are usually painless. Most lipomas do not cause problems and do not require treatment. CAUSES The cause of this condition is not known. RISK FACTORS This condition is more likely to develop in:  People who are 31-64 years old.  People who have a family history of lipomas. SYMPTOMS A lipoma usually appears as a small, round bump under the skin. It may feel soft or rubbery, but the firmness can vary. Most lipomas are not painful. However, a lipoma may become painful if it is located in an area where it pushes on nerves. DIAGNOSIS A lipoma can usually be diagnosed with a physical exam. You may also have tests to confirm the diagnosis and to rule out other conditions. Tests may include:  Imaging tests, such as a CT scan or MRI.  Removal of a tissue sample to be looked at under a microscope (biopsy). TREATMENT Treatment is not needed for small lipomas that are not causing problems. If a lipoma continues to get bigger or it causes problems, removal is often the best option. Lipomas can also be removed to improve appearance. Removal of a lipoma is usually done with a surgery in which the fatty cells and the surrounding capsule are removed. Most often, a medicine that numbs the area (local anesthetic) is used for this procedure. HOME CARE INSTRUCTIONS  Keep all follow-up visits as directed by your health care provider. This is important. SEEK MEDICAL CARE IF:  Your lipoma becomes larger or hard.  Your lipoma becomes painful, red, or increasingly swollen. These could be signs of infection or a more serious condition.   This information is  not intended to replace advice given to you by your health care provider. Make sure you discuss any questions you have with your health care provider.   Document Released: 08/02/2002 Document Revised: 12/27/2014 Document Reviewed: 08/08/2014 Elsevier Interactive Patient Education 2016 Elsevier Inc.  Osteoarthritis Osteoarthritis is a disease that causes soreness and inflammation of a joint. It occurs when the cartilage at the affected joint wears down. Cartilage acts as a cushion, covering the ends of bones where they meet to form a joint. Osteoarthritis is the most common form of arthritis. It often occurs in older people. The joints affected most often by this condition include those in the:  Ends of the fingers.  Thumbs.  Neck.  Lower back.  Knees.  Hips. CAUSES  Over time, the cartilage that covers the ends of bones begins to wear away. This causes bone to rub on bone, producing pain and stiffness in the affected joints.  RISK FACTORS Certain factors can increase your chances of having osteoarthritis, including:  Older age.  Excessive body weight.  Overuse of joints.  Previous joint injury. SIGNS AND SYMPTOMS   Pain, swelling, and stiffness in the joint.  Over time, the joint may lose its normal shape.  Small deposits of bone (osteophytes) may grow on the edges of the joint.  Bits of bone or cartilage can break off and float inside the joint space. This may cause more pain and damage. DIAGNOSIS  Your health care provider will do a physical exam and ask about your  symptoms. Various tests may be ordered, such as:  X-rays of the affected joint.  Blood tests to rule out other types of arthritis. Additional tests may be used to diagnose your condition. TREATMENT  Goals of treatment are to control pain and improve joint function. Treatment plans may include:  A prescribed exercise program that allows for rest and joint relief.  A weight control plan.  Pain relief  techniques, such as:  Properly applied heat and cold.  Electric pulses delivered to nerve endings under the skin (transcutaneous electrical nerve stimulation [TENS]).  Massage.  Certain nutritional supplements.  Medicines to control pain, such as:  Acetaminophen.  Nonsteroidal anti-inflammatory drugs (NSAIDs), such as naproxen.  Narcotic or central-acting agents, such as tramadol.  Corticosteroids. These can be given orally or as an injection.  Surgery to reposition the bones and relieve pain (osteotomy) or to remove loose pieces of bone and cartilage. Joint replacement may be needed in advanced states of osteoarthritis. HOME CARE INSTRUCTIONS   Take medicines only as directed by your health care provider.  Maintain a healthy weight. Follow your health care provider's instructions for weight control. This may include dietary instructions.  Exercise as directed. Your health care provider can recommend specific types of exercise. These may include:  Strengthening exercises. These are done to strengthen the muscles that support joints affected by arthritis. They can be performed with weights or with exercise bands to add resistance.  Aerobic activities. These are exercises, such as brisk walking or low-impact aerobics, that get your heart pumping.  Range-of-motion activities. These keep your joints limber.  Balance and agility exercises. These help you maintain daily living skills.  Rest your affected joints as directed by your health care provider.  Keep all follow-up visits as directed by your health care provider. SEEK MEDICAL CARE IF:   Your skin turns red.  You develop a rash in addition to your joint pain.  You have worsening joint pain.  You have a fever along with joint or muscle aches. SEEK IMMEDIATE MEDICAL CARE IF:  You have a significant loss of weight or appetite.  You have night sweats. [place] of Arthritis and  Musculoskeletal and Skin Diseases: www.niams.SouthExposed.es  [employer] on Aging: http://kim-miller.com/  American College of Rheumatology: www.rheumatology.org   This information is not intended to replace advice given to you by your health care provider. Make sure you discuss any questions you have with your health care provider.   Document Released: 08/12/2005 Document Revised: 09/02/2014 Document Reviewed: 04/19/2013 Elsevier Interactive Patient Education [employer].
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
## 2016-11-09 ENCOUNTER — Encounter (HOSPITAL_COMMUNITY): Payer: Self-pay

## 2016-11-09 ENCOUNTER — Emergency Department (HOSPITAL_COMMUNITY): Payer: Medicare Other

## 2016-11-09 DIAGNOSIS — E119 Type 2 diabetes mellitus without complications: Secondary | ICD-10-CM | POA: Insufficient documentation

## 2016-11-09 DIAGNOSIS — J441 Chronic obstructive pulmonary disease with (acute) exacerbation: Secondary | ICD-10-CM | POA: Insufficient documentation

## 2016-11-09 DIAGNOSIS — I10 Essential (primary) hypertension: Secondary | ICD-10-CM | POA: Insufficient documentation

## 2016-11-09 DIAGNOSIS — Z7982 Long term (current) use of aspirin: Secondary | ICD-10-CM | POA: Insufficient documentation

## 2016-11-09 DIAGNOSIS — F17210 Nicotine dependence, cigarettes, uncomplicated: Secondary | ICD-10-CM | POA: Insufficient documentation

## 2016-11-09 NOTE — ED Triage Notes (Signed)
I have a cold and I have COPD.  I am out of my inhaler per pt.  I have been out of it for a week and it will be Monday before I can get it.  Patient states that the cold is more in his chest.  Have been coughing a lot.

## 2016-11-10 ENCOUNTER — Emergency Department (HOSPITAL_COMMUNITY)
Admission: EM | Admit: 2016-11-10 | Discharge: 2016-11-10 | Disposition: A | Discharge status: Home / Self Care | Payer: Medicare Other | Attending: Emergency Medicine | Admitting: Emergency Medicine

## 2016-11-10 DIAGNOSIS — J441 Chronic obstructive pulmonary disease with (acute) exacerbation: Secondary | ICD-10-CM

## 2016-11-10 LAB — CBG MONITORING, ED: GLUCOSE-CAPILLARY: 216 mg/dL — AB (ref 65–99)

## 2016-11-10 MED ORDER — LISINOPRIL 40 MG PO TABS: 40.0000 mg | ORAL_TABLET | Freq: Every day | ORAL | 0 refills | Status: DC

## 2016-11-10 MED ORDER — PREDNISONE 50 MG PO TABS
60.0000 mg | ORAL_TABLET | Freq: Once | ORAL | Status: AC
  Administered 2016-11-10: 01:00:00 60 mg via ORAL
  Filled 2016-11-10: qty 1

## 2016-11-10 MED ORDER — PREDNISONE 50 MG PO TABS: ORAL_TABLET | ORAL | 0 refills | Status: DC

## 2016-11-10 MED ORDER — METFORMIN HCL 500 MG PO TABS: 500.0000 mg | ORAL_TABLET | Freq: Every day | ORAL | 6 refills | Status: DC

## 2016-11-10 MED ORDER — IPRATROPIUM-ALBUTEROL 0.5-2.5 (3) MG/3ML IN SOLN
3.0000 mL | Freq: Once | RESPIRATORY_TRACT | Status: AC
  Administered 2016-11-10: 3 mL via RESPIRATORY_TRACT
  Filled 2016-11-10: qty 3

## 2016-11-10 MED ORDER — ALBUTEROL SULFATE HFA 108 (90 BASE) MCG/ACT IN AERS: 2.0000 | INHALATION_SPRAY | Freq: Four times a day (QID) | RESPIRATORY_TRACT | 0 refills | Status: DC | PRN

## 2016-11-10 NOTE — ED Provider Notes (Signed)
[facility] DEPT Provider Note   CSN: [id number] Arrival date & time: 11/09/16  2144   By signing my name below, I, Eunice Blase, attest that this documentation has been prepared under the direction and in the presence of Ezequiel Essex, MD. Electronically signed, Eunice Blase, ED Scribe. 11/10/16. 1:34 AM.   History   Chief Complaint Chief Complaint  Patient presents with  . URI   The history is provided by the patient and medical records. No language interpreter was used.    HPI Comments: Larry Lam is a 46 y.o. male with Hx of COPD who presents to the Emergency Department complaining of persistent cough x 2 days. He notes associated sore throat and SOB. Pt out of albuterol at home and no other medications regularly. He states he normally uses his albuterol once per day 3 times weekly. He adds he was hospitalized once 3 years ago at [facility] for COPD. Pt reportedly not followed regularly by PCP in "a couple years". Pt reports hx of seizures once, sarcoidosis, DM and HTN. He adds none of these have been controlled with medication x "a few years". He notes he did pursue regular treatment for previously mentioned chronic problems prior to reportedly discontinuing treatment as mentioned above. He reports tobacco use. Pt denies fever, chest pain, rhinorrhea, abdominal pain and leg swelling.  Past Medical History:  Diagnosis Date  . COPD (chronic obstructive pulmonary disease) ([place])   . Diabetes mellitus without complication ([place])   . Hypertension     Patient Active Problem List   Diagnosis Date Noted  . Seizures ([place]) 01/19/2015  . Hypertension 01/19/2015  . Hypokalemia 01/19/2015  . DM (diabetes mellitus) ([place]) 01/19/2015  . COPD (chronic obstructive pulmonary disease) ([place]) 01/19/2015    History reviewed. No pertinent surgical history.     Home Medications    Prior to Admission medications   Medication Sig Start Date End Date Taking? Authorizing  Provider  acetaminophen (TYLENOL) 500 MG tablet Take 500 mg by mouth every 6 (six) hours as needed for mild pain or moderate pain.    Historical Provider, MD  albuterol (PROVENTIL HFA;VENTOLIN HFA) 108 (90 BASE) MCG/ACT inhaler Inhale 2 puffs into the lungs every 6 (six) hours as needed for wheezing or shortness of breath.    Historical Provider, MD  aspirin 81 MG EC tablet Take 1 tablet (81 mg total) by mouth daily. Patient not taking: Reported on 12/11/2015 07/28/15   Earleen Newport, MD  levETIRAcetam (KEPPRA) 500 MG tablet Take 1 tablet (500 mg total) by mouth 2 (two) times daily. Patient not taking: Reported on 12/11/2015 07/28/15   Earleen Newport, MD  lisinopril (PRINIVIL,ZESTRIL) 40 MG tablet Take 1 tablet (40 mg total) by mouth daily. Patient not taking: Reported on 12/11/2015 07/28/15   Earleen Newport, MD  metFORMIN (GLUCOPHAGE) 500 MG tablet Take 1 tablet (500 mg total) by mouth daily. Patient not taking: Reported on 12/11/2015 07/28/15   Earleen Newport, MD  naproxen (NAPROSYN) 375 MG tablet Take 1 tablet (375 mg total) by mouth 2 (two) times daily as needed for mild pain or moderate pain. 12/11/15   Evalee Jefferson, PA-C    Family History Family History  Problem Relation Age of Onset  . Cancer Father     Social History Social History  Substance Use Topics  . Smoking status: Current Every Day Smoker    Packs/day: 1.00    Types: Cigarettes  . Smokeless tobacco: Former Systems developer  . Alcohol use  No     Allergies   Patient has no known allergies.   Review of Systems Review of Systems  All other systems reviewed and are negative.  A complete 10 system review of systems was obtained and all systems are negative except as noted in the HPI and PMH.    Physical Exam Updated Vital Signs BP (!) 186/105 (BP Location: Right Arm)   Pulse 86   Temp 98.5 F (36.9 C) (Oral)   Resp 20   Ht 5\' 10"  (1.778 m)   Wt 230 lb (104.3 kg)   SpO2 96%   BMI 33.00 kg/m   Physical Exam   Constitutional: He is oriented to person, place, and time. He appears well-developed and well-nourished. No distress.  Pt speaks in full sentences  HENT:  Head: Normocephalic and atraumatic.  Mouth/Throat: Oropharynx is clear and moist. No oropharyngeal exudate.  Eyes: Conjunctivae and EOM are normal. Pupils are equal, round, and reactive to light.  Neck: Normal range of motion. Neck supple.  No meningismus.  Cardiovascular: Normal rate, regular rhythm, normal heart sounds and intact distal pulses.   No murmur heard. Pulmonary/Chest: Effort normal. No respiratory distress. He has decreased breath sounds. He has wheezes (expiratory).  Abdominal: Soft. There is no tenderness. There is no rebound and no guarding.  Musculoskeletal: Normal range of motion. He exhibits no edema or tenderness.  Neurological: He is alert and oriented to person, place, and time. No cranial nerve deficit. He exhibits normal muscle tone. Coordination normal.   5/5 strength throughout. CN 2-12 intact.Equal grip strength.   Skin: Skin is warm.  Psychiatric: He has a normal mood and affect. His behavior is normal.  Nursing note and vitals reviewed.  ED Treatments / Results  DIAGNOSTIC STUDIES: Oxygen Saturation is 96% on RA, adequate by my interpretation.    COORDINATION OF CARE: 12:14 AM Discussed treatment plan with pt at bedside and pt agreed to plan. Will order labs and medications.  Labs (all labs ordered are listed, but only abnormal results are displayed) Labs Reviewed  CBG MONITORING, ED - Abnormal; Notable for the following:       Result Value   Glucose-Capillary 216 (*)    All other components within normal limits    EKG  EKG Interpretation None       Radiology Dg Chest 2 View  Result Date: 11/09/2016 CLINICAL DATA:  Productive cough, shortness of breath, chest congestion for 3 days. History of COPD, diabetes, hypertension EXAM: CHEST  2 VIEW COMPARISON:  06/28/2010 FINDINGS: The heart size  and mediastinal contours are within normal limits. Both lungs are clear. The visualized skeletal structures are unremarkable. IMPRESSION: No active cardiopulmonary disease. Electronically Signed   By: Lucienne Capers M.D.   On: 11/09/2016 22:31    Procedures Procedures (including critical care time)  Medications Ordered in ED Medications  ipratropium-albuterol (DUONEB) 0.5-2.5 (3) MG/3ML nebulizer solution 3 mL (3 mLs Nebulization Given 11/10/16 0032)  predniSONE (DELTASONE) tablet 60 mg (60 mg Oral Given 11/10/16 0032)     Initial Impression / Assessment and Plan / ED Course  I have reviewed the triage vital signs and the nursing notes.  Pertinent labs & imaging results that were available during my care of the patient were reviewed by me and considered in my medical decision making (see chart for details).  Patient with COPD presents with chest congestion out of his inhalers. Denies chest pain, fever. Cough is nonproductive. Has been noncompliant with his blood pressure and diabetic  medications.  No distress. Wheezing on exam. Nebulizer and prednisone given.  Chest x-ray shows no infiltrate. Patient is ambulatory without desaturation.  We'll treat for COPD exacerbation with steroids and bronchodilators. Patient aware steroids and elevate his blood sugar. We'll restart his blood pressure medication as well as metformin. Needs to establish care with PCP. Return precautions discussed.    Final Clinical Impressions(s) / ED Diagnoses   Final diagnoses:  COPD exacerbation [facility])    New Prescriptions New Prescriptions   No medications on file     Ezequiel Essex, MD 11/10/16 [phone]

## 2016-11-10 NOTE — Discharge Instructions (Signed)
Take the antibiotics and steroids as prescribed. Watch your blood sugars carefully while on the steroids. Restart your blood pressure and diabetes medications. Establish care with a primary physician. Return to the ED if you develop new or worsening symptoms.

## 2016-11-10 NOTE — ED Notes (Signed)
Pt ambulatory to waiting room. Pt verbalized understanding of discharge instructions.   

## 2017-04-27 ENCOUNTER — Emergency Department (HOSPITAL_COMMUNITY): Payer: Self-pay

## 2017-04-27 ENCOUNTER — Observation Stay (HOSPITAL_COMMUNITY)
Admission: EM | Admit: 2017-04-27 | Discharge: 2017-04-29 | Disposition: A | Discharge status: Home / Self Care | Payer: Self-pay | Attending: Emergency Medicine | Admitting: Emergency Medicine

## 2017-04-27 ENCOUNTER — Encounter (HOSPITAL_COMMUNITY): Payer: Self-pay | Admitting: Emergency Medicine

## 2017-04-27 DIAGNOSIS — Z79899 Other long term (current) drug therapy: Secondary | ICD-10-CM | POA: Insufficient documentation

## 2017-04-27 DIAGNOSIS — Z720 Tobacco use: Secondary | ICD-10-CM

## 2017-04-27 DIAGNOSIS — I10 Essential (primary) hypertension: Secondary | ICD-10-CM | POA: Insufficient documentation

## 2017-04-27 DIAGNOSIS — F17210 Nicotine dependence, cigarettes, uncomplicated: Secondary | ICD-10-CM | POA: Insufficient documentation

## 2017-04-27 DIAGNOSIS — E1165 Type 2 diabetes mellitus with hyperglycemia: Secondary | ICD-10-CM

## 2017-04-27 DIAGNOSIS — Z7984 Long term (current) use of oral hypoglycemic drugs: Secondary | ICD-10-CM | POA: Insufficient documentation

## 2017-04-27 DIAGNOSIS — E119 Type 2 diabetes mellitus without complications: Secondary | ICD-10-CM | POA: Insufficient documentation

## 2017-04-27 DIAGNOSIS — R079 Chest pain, unspecified: Secondary | ICD-10-CM | POA: Insufficient documentation

## 2017-04-27 DIAGNOSIS — D72829 Elevated white blood cell count, unspecified: Secondary | ICD-10-CM | POA: Insufficient documentation

## 2017-04-27 DIAGNOSIS — J9601 Acute respiratory failure with hypoxia: Secondary | ICD-10-CM | POA: Insufficient documentation

## 2017-04-27 DIAGNOSIS — J9600 Acute respiratory failure, unspecified whether with hypoxia or hypercapnia: Secondary | ICD-10-CM

## 2017-04-27 DIAGNOSIS — R0902 Hypoxemia: Secondary | ICD-10-CM

## 2017-04-27 DIAGNOSIS — J441 Chronic obstructive pulmonary disease with (acute) exacerbation: Principal | ICD-10-CM | POA: Insufficient documentation

## 2017-04-27 DIAGNOSIS — Z23 Encounter for immunization: Secondary | ICD-10-CM | POA: Insufficient documentation

## 2017-04-27 HISTORY — DX: Unspecified convulsions: R56.9

## 2017-04-27 HISTORY — DX: Tobacco use: Z72.0

## 2017-04-27 LAB — BASIC METABOLIC PANEL
ANION GAP: 9 (ref 5–15)
BUN: 10 mg/dL (ref 6–20)
CO2: 30 mmol/L (ref 22–32)
Calcium: 9 mg/dL (ref 8.9–10.3)
Chloride: 99 mmol/L — ABNORMAL LOW (ref 101–111)
Creatinine, Ser: 0.86 mg/dL (ref 0.61–1.24)
GFR calc Af Amer: >60 mL/min (ref >60)
Glucose, Bld: 276 mg/dL — ABNORMAL HIGH (ref 65–99)
POTASSIUM: 3.6 mmol/L (ref 3.5–5.1)
SODIUM: 138 mmol/L (ref 135–145)

## 2017-04-27 LAB — CBC WITH DIFFERENTIAL/PLATELET
Basophils Absolute: 0 10*3/uL (ref 0.0–0.1)
Basophils Relative: 0 %
EOS PCT: 1 %
Eosinophils Absolute: 0.2 10*3/uL (ref 0.0–0.7)
HEMATOCRIT: 46.9 % (ref 39.0–52.0)
HEMOGLOBIN: 16 g/dL (ref 13.0–17.0)
LYMPHS ABS: 2.3 10*3/uL (ref 0.7–4.0)
LYMPHS PCT: 14 %
MCH: 30.6 pg (ref 26.0–34.0)
MCHC: 34.1 g/dL (ref 30.0–36.0)
MCV: 89.7 fL (ref 78.0–100.0)
Monocytes Absolute: 0.8 10*3/uL (ref 0.1–1.0)
Monocytes Relative: 5 %
NEUTROS ABS: 12.5 10*3/uL — AB (ref 1.7–7.7)
Neutrophils Relative %: 80 %
PLATELETS: 176 10*3/uL (ref 150–400)
RBC: 5.23 MIL/uL (ref 4.22–5.81)
RDW: 13.6 % (ref 11.5–15.5)
WBC: 15.9 10*3/uL — AB (ref 4.0–10.5)

## 2017-04-27 LAB — TROPONIN I

## 2017-04-27 MED ORDER — ALBUTEROL (5 MG/ML) CONTINUOUS INHALATION SOLN
10.0000 mg/h | INHALATION_SOLUTION | Freq: Once | RESPIRATORY_TRACT | Status: AC
  Administered 2017-04-27: 10 mg/h via RESPIRATORY_TRACT
  Filled 2017-04-27: qty 20

## 2017-04-27 MED ORDER — ALBUTEROL SULFATE (2.5 MG/3ML) 0.083% IN NEBU: 5.0000 mg | INHALATION_SOLUTION | Freq: Once | RESPIRATORY_TRACT | Status: DC

## 2017-04-27 MED ORDER — IPRATROPIUM BROMIDE 0.02 % IN SOLN
1.0000 mg | Freq: Once | RESPIRATORY_TRACT | Status: AC
  Administered 2017-04-27: 1 mg via RESPIRATORY_TRACT
  Filled 2017-04-27: qty 5

## 2017-04-27 MED ORDER — PREDNISONE 10 MG PO TABS
60.0000 mg | ORAL_TABLET | Freq: Once | ORAL | Status: AC
  Administered 2017-04-27: 60 mg via ORAL
  Filled 2017-04-27: qty 1

## 2017-04-27 NOTE — ED Notes (Signed)
Pt sats at 88% on room air while sitting in bed. Upon ambulation, pt sats ranged from 88-90% on room air. When pt arrived back into room, sats at 85% on room air. Pt placed on 2L via [place].

## 2017-04-27 NOTE — ED Triage Notes (Signed)
Pt states increase in SOB over the last 4 days

## 2017-04-27 NOTE — H&P (Signed)
History and Physical    Larry Lam IOX:[id number] DOB: 1970-09-07 DOA: 04/27/2017  PCP: System, Pcp Not In   Patient coming from: Home.  I have personally briefly reviewed patient's old medical records in [place]  Chief Complaint: Shortness of breath.  HPI: Larry Lam is a 46 y.o. male with medical history significant of COPD, type 2 diabetes, hypertension who is coming to the emergency department with complaints of progressively worse shortness of breath and wheezing for the past 4 days despite using his girlfriend's Symbicort. He also complains of a dry cough and itchy throat. He denies fever, chills, but feels fatigued. He denies chest pain, palpitations, dizziness, diaphoresis, pitting edema of the lower extremities, abdominal pain, nausea, emesis, diarrhea or constipation. He denies dysuria, frequency or hematuria. Denies blurred vision, polyuria, polydipsia or polyphagia. No pruritus or skin rashes.  ED Course: Initial vital signs temperature 99.25F, pulse 120, blood pressure 207/92 mmHg, respirations 18 and O2 sat 93%. The patient received supplemental oxygen, bronchodilators and prednisone 60 mg in the emergency department.  Workup shows WBC of 15.9 with 80% neutrophils, hemoglobin 16 g for the city and platelets 177. His BMP was normal except for blood glucose of 276 mg/dL. Troponin level was normal. EKG shows sinus tachycardia, probable left atrial enlargement, right axis deviation and borderline prolonged QT intervals. His chest radiograph showed increased interstitial prominence more so on the right side. Questionable blood kidney change.  Review of Systems: As per HPI otherwise 10 point review of systems negative.    Past Medical History:  Diagnosis Date  . COPD (chronic obstructive pulmonary disease) ([place])   . Diabetes mellitus without complication ([place])   . Hypertension     History reviewed. No pertinent surgical history.   reports that he has  been smoking Cigarettes.  He has been smoking about 1.00 pack per day. He has quit using smokeless tobacco. He reports that he drinks alcohol. He reports that he does not use drugs.  No Known Allergies  Family History  Problem Relation Age of Onset  . Cancer Father     Prior to Admission medications   Medication Sig Start Date End Date Taking? Authorizing Provider  albuterol (PROVENTIL HFA;VENTOLIN HFA) 108 (90 Base) MCG/ACT inhaler Inhale 2 puffs into the lungs every 6 (six) hours as needed for wheezing or shortness of breath. 11/10/16   Rancour, Annie Main, MD  lisinopril (PRINIVIL,ZESTRIL) 40 MG tablet Take 1 tablet (40 mg total) by mouth daily. Patient not taking: Reported on 04/27/2017 11/10/16   Ezequiel Essex, MD  metFORMIN (GLUCOPHAGE) 500 MG tablet Take 1 tablet (500 mg total) by mouth daily. Patient not taking: Reported on 04/27/2017 11/10/16   Ezequiel Essex, MD    Physical Exam: Vitals:   04/27/17 2117 04/27/17 2130 04/27/17 2230 04/27/17 2300  BP: (!) 190/98 (!) 181/97 (!) 160/75 (!) 160/89  Pulse: (!) 106 (!) 106 (!) 116 (!) 106  Resp: (!) 21 (!) 24 (!) 22 (!) 22  Temp:      TempSrc:      SpO2: 94% 95% 94% 93%  Weight:      Height:        Constitutional: NAD, calm, comfortable Eyes: PERRL, lids and conjunctivae normal ENMT: Mucous membranes are moist. Mild pharyngeal erythema with small areas of whitish exudate, particularly around the uvula. Neck: normal, supple, no masses, no thyromegaly Respiratory: Decreased breath sounds bilaterally with wheezing, no crackles. Normal respiratory effort. No accessory muscle use.  Cardiovascular: Regular rate and  rhythm, no murmurs / rubs / gallops. No extremity edema. 2+ pedal pulses. No carotid bruits.  Abdomen: no tenderness, no masses palpated. No hepatosplenomegaly. Bowel sounds positive.  Musculoskeletal: no clubbing / cyanosis. Good ROM, no contractures. Normal muscle tone.  Skin: no rashes, lesions, ulcers on limited skin  exam Neurologic: CN 2-12 grossly intact. Sensation intact, DTR normal. Strength 5/5 in all 4.  Psychiatric: Normal judgment and insight. Alert and oriented x 4. Normal mood.     Labs on Admission: I have personally reviewed following labs and imaging studies  CBC:  Recent Labs Lab 04/27/17 2103  WBC 15.9*  NEUTROABS 12.5*  HGB 16.0  HCT 46.9  MCV 89.7  PLT 222   Basic Metabolic Panel:  Recent Labs Lab 04/27/17 2103  NA 138  K 3.6  CL 99*  CO2 30  GLUCOSE 276*  BUN 10  CREATININE 0.86  CALCIUM 9.0   GFR: Estimated Creatinine Clearance: 125.7 mL/min (by C-G formula based on SCr of 0.86 mg/dL). Liver Function Tests: No results for input(s): AST, ALT, ALKPHOS, BILITOT, PROT, ALBUMIN in the last 168 hours. No results for input(s): LIPASE, AMYLASE in the last 168 hours. No results for input(s): AMMONIA in the last 168 hours. Coagulation Profile: No results for input(s): INR, PROTIME in the last 168 hours. Cardiac Enzymes:  Recent Labs Lab 04/27/17 2103  TROPONINI <0.03   BNP (last 3 results) No results for input(s): PROBNP in the last 8760 hours. HbA1C: No results for input(s): HGBA1C in the last 72 hours. CBG: No results for input(s): GLUCAP in the last 168 hours. Lipid Profile: No results for input(s): CHOL, HDL, LDLCALC, TRIG, CHOLHDL, LDLDIRECT in the last 72 hours. Thyroid Function Tests: No results for input(s): TSH, T4TOTAL, FREET4, T3FREE, THYROIDAB in the last 72 hours. Anemia Panel: No results for input(s): VITAMINB12, FOLATE, FERRITIN, TIBC, IRON, RETICCTPCT in the last 72 hours. Urine analysis:    Component Value Date/Time   COLORURINE Straw 07/05/2012 0103   APPEARANCEUR Clear 07/05/2012 0103   LABSPEC 1.033 07/05/2012 0103   PHURINE 6.0 07/05/2012 0103   GLUCOSEU >=500 07/05/2012 0103   HGBUR Negative 07/05/2012 0103   BILIRUBINUR Negative 07/05/2012 0103   KETONESUR Negative 07/05/2012 0103   PROTEINUR Negative 07/05/2012 0103    NITRITE Negative 07/05/2012 0103   LEUKOCYTESUR Negative 07/05/2012 0103    Radiological Exams on Admission: Dg Chest 2 View  Result Date: 04/27/2017 CLINICAL DATA:  Dyspnea increased over the past 4 days. EXAM: CHEST  2 VIEW COMPARISON:  None. FINDINGS: The heart size and mediastinal contours are within normal limits. Mild increase in interstitial prominence may reflect bronchitic change more so on the right. No pneumonic consolidations. The visualized skeletal structures are unremarkable. IMPRESSION: Increased interstitial prominence more so the right may reflect bronchitic change. Exam is otherwise stable. Electronically Signed   By: Ashley Royalty M.D.   On: 04/27/2017 22:47    EKG: Independently reviewed. Vent. rate 107 BPM PR interval * ms QRS duration 101 ms QT/QTc 364/486 ms P-R-T axes 75 102 70 Sinus tachycardia Probable left atrial enlargement Right axis deviation Borderline prolonged QT interval  Assessment/Plan Principal Problem:   COPD exacerbation (HCC) Admit to telemetry/observation. Continue supplemental oxygen. Continue as scheduled and as needed bronchodilators. Single dose Solu-Medrol 40 mg IVP. Levaquin 750 milligrams IVPB every 24 hours. Advised the patient to have his on albuterol inhalers or nebulizer solution at home. Smoking cessation was advised. He was made aware of increased risk of CV, resp. Cancer,  etc.  Active Problems:   Hypertension Currently not taking medications. Resume lisinopril at 20 mg by mouth daily, which is half of what the patient is supposed to take. Monitor blood pressure, renal function and electrolytes.    Leucocytosis Per patient, he has seen hematologist in the past for this. Monitor WBC.    DM (diabetes mellitus) ([place]) Currently not taking metformin or any other medications. Carbohydrate modified diet. CBG monitoring with regular insulin sliding scale.    Hypophosphatemia. Supplementation ordered.    Tobacco  use Nicotine replacement therapy ordered. Smoking cessation information will be provided.    DVT prophylaxis: Lovenox SQ. Code Status: Full code. Family Communication: His son was present in the emergency department. Disposition Plan: Admit for COPD exacerbation treatment for 24-48 hours. Consults called:  Admission status: Observation/telemetry.   Reubin Milan MD [facility] Pager [phone].  If 7PM-7AM, please contact night-coverage [email or web address] Password [facility]  04/27/2017, 11:42 PM

## 2017-04-27 NOTE — ED Provider Notes (Signed)
[facility] DEPT Provider Note   CSN: [id number] Arrival date & time: 04/27/17  1940     History   Chief Complaint Chief Complaint  Patient presents with  . Shortness of Breath    HPI TRACER GUTRIDGE is a 46 y.o. male.  HPI  Pt was seen at 2105.  Per pt, c/o gradual onset and worsening of persistent cough, wheezing and SOB for the past 4 to 5 days.  Describes his symptoms as "my COPD is acting up."  Has been using home MDI without relief. Has been associated with 1-2 weeks of multiple intermittent episodes of "sharp" anterior chest pain that lasts "1-2 seconds" each episode.  Denies palpitations, no back pain, no abd pain, no N/V/D, no fevers, no rash.    Past Medical History:  Diagnosis Date  . COPD (chronic obstructive pulmonary disease) ([place])   . Diabetes mellitus without complication ([place])   . Hypertension     Patient Active Problem List   Diagnosis Date Noted  . Seizures ([place]) 01/19/2015  . Hypertension 01/19/2015  . Hypokalemia 01/19/2015  . DM (diabetes mellitus) ([place]) 01/19/2015  . COPD (chronic obstructive pulmonary disease) ([place]) 01/19/2015    History reviewed. No pertinent surgical history.     Home Medications    Prior to Admission medications   Medication Sig Start Date End Date Taking? Authorizing Provider  acetaminophen (TYLENOL) 500 MG tablet Take 500 mg by mouth every 6 (six) hours as needed for mild pain or moderate pain.    [provider]  albuterol (PROVENTIL HFA;VENTOLIN HFA) 108 (90 Base) MCG/ACT inhaler Inhale 2 puffs into the lungs every 6 (six) hours as needed for wheezing or shortness of breath. 11/10/16   Rancour, Annie Main, MD  aspirin 81 MG EC tablet Take 1 tablet (81 mg total) by mouth daily. Patient not taking: Reported on 12/11/2015 07/28/15   Earleen Newport, MD  levETIRAcetam (KEPPRA) 500 MG tablet Take 1 tablet (500 mg total) by mouth 2 (two) times daily. Patient not taking: Reported on 12/11/2015 07/28/15    Earleen Newport, MD  lisinopril (PRINIVIL,ZESTRIL) 40 MG tablet Take 1 tablet (40 mg total) by mouth daily. 11/10/16   Rancour, Annie Main, MD  metFORMIN (GLUCOPHAGE) 500 MG tablet Take 1 tablet (500 mg total) by mouth daily. 11/10/16   Rancour, Annie Main, MD  naproxen (NAPROSYN) 375 MG tablet Take 1 tablet (375 mg total) by mouth 2 (two) times daily as needed for mild pain or moderate pain. 12/11/15   Evalee Jefferson, PA-C  predniSONE (DELTASONE) 50 MG tablet 1 tablet PO daily 11/10/16   Ezequiel Essex, MD    Family History Family History  Problem Relation Age of Onset  . Cancer Father     Social History Social History  Substance Use Topics  . Smoking status: Current Every Day Smoker    Packs/day: 1.00    Types: Cigarettes  . Smokeless tobacco: Former Systems developer  . Alcohol use 0.0 oz/week     Comment: rare     Allergies   Patient has no known allergies.   Review of Systems Review of Systems ROS: Statement: All systems negative except as marked or noted in the HPI; Constitutional: Negative for fever and chills. ; ; Eyes: Negative for eye pain, redness and discharge. ; ; ENMT: Negative for ear pain, hoarseness, nasal congestion, sinus pressure and sore throat. ; ; Cardiovascular: Negative for palpitations, diaphoresis, and peripheral edema. ; ; Respiratory: +cough, wheezing, SOB. Negative for stridor. ; ; Gastrointestinal: Negative for  nausea, vomiting, diarrhea, abdominal pain, blood in stool, hematemesis, jaundice and rectal bleeding. . ; ; Genitourinary: Negative for dysuria, flank pain and hematuria. ; ; Musculoskeletal: +chest wall pain. Negative for back pain and neck pain. Negative for swelling and trauma.; ; Skin: Negative for pruritus, rash, abrasions, blisters, bruising and skin lesion.; ; Neuro: Negative for headache, lightheadedness and neck stiffness. Negative for weakness, altered level of consciousness, altered mental status, extremity weakness, paresthesias, involuntary movement,  seizure and syncope.       Physical Exam Updated Vital Signs BP (!) 190/98   Pulse (!) 106   Temp 99 F (37.2 C) (Oral)   Resp (!) 21   Ht 5\' 10"  (1.778 m)   Wt 99.8 kg (220 lb)   SpO2 94%   BMI 31.57 kg/m    Patient Vitals for the past 24 hrs:  BP Temp Temp src Pulse Resp SpO2 Height Weight  04/27/17 2300 (!) 160/89 - - (!) 106 (!) 22 93 % - -  04/27/17 2230 (!) 160/75 - - (!) 116 (!) 22 94 % - -  04/27/17 2130 (!) 181/97 - - (!) 106 (!) 24 95 % - -  04/27/17 2117 (!) 190/98 - - (!) 106 (!) 21 94 % - -  04/27/17 2116 (!) 190/98 - - (!) 104 (!) 22 92 % - -  04/27/17 1952 (!) 207/92 99 F (37.2 C) Oral (!) 120 18 93 % - -  04/27/17 1951 - - - - - - 5\' 10"  (1.778 m) 99.8 kg (220 lb)     Physical Exam 2110: Physical examination:  Nursing notes reviewed; Vital signs and O2 SAT reviewed;  Constitutional: Well developed, Well nourished, Well hydrated, Uncomfortable appearing.;; Head:  Normocephalic, atraumatic; Eyes: EOMI, PERRL, No scleral icterus; ENMT: Mouth and pharynx normal, Mucous membranes moist; Neck: Supple, Full range of motion, No lymphadenopathy; Cardiovascular: Tachycardic rate and rhythm, No gallop; Respiratory: Breath sounds diminished & equal bilaterally, insp/exp wheezes bilat. No wheezing. Speaking short sentences, sitting upright, tachypneic.; Chest: Nontender, Movement normal; Abdomen: Soft, Nontender, Nondistended, Normal bowel sounds; Genitourinary: No CVA tenderness; Extremities: Pulses normal, No tenderness, No edema, No calf edema or asymmetry.; Neuro: AA&Ox3, Major CN grossly intact.  Speech clear. No gross focal motor or sensory deficits in extremities.; Skin: Color normal, Warm, Dry.   ED Treatments / Results  Labs (all labs ordered are listed, but only abnormal results are displayed)   EKG  EKG Interpretation  Date/Time:  Sunday April 27 2017 21:14:58 EDT Ventricular Rate:  107 PR Interval:    QRS Duration: 101 QT Interval:  364 QTC  Calculation: 486 R Axis:   102 Text Interpretation:  Sinus tachycardia Probable left atrial enlargement Right axis deviation Borderline prolonged QT interval When compared with ECG of 01/19/2015 No significant change was found Confirmed by Francine Graven [phone]) on 04/27/2017 9:21:57 PM       Radiology   Procedures Procedures (including critical care time)  Medications Ordered in ED Medications  predniSONE (DELTASONE) tablet 60 mg (not administered)  albuterol (PROVENTIL,VENTOLIN) solution continuous neb (not administered)  ipratropium (ATROVENT) nebulizer solution 1 mg (not administered)     Initial Impression / Assessment and Plan / ED Course  I have reviewed the triage vital signs and the nursing notes.  Pertinent labs & imaging results that were available during my care of the patient were reviewed by me and considered in my medical decision making (see chart for details).  MDM Reviewed: previous chart, nursing note and  vitals Reviewed previous: labs and ECG Interpretation: labs, ECG and x-ray Total time providing critical care: 30-74 minutes. This excludes time spent performing separately reportable procedures and services.   CRITICAL CARE Performed by: Alfonzo Feller Total critical care time: 35 minutes Critical care time was exclusive of separately billable procedures and treating other patients. Critical care was necessary to treat or prevent imminent or life-threatening deterioration. Critical care was time spent personally by me on the following activities: development of treatment plan with patient and/or surrogate as well as nursing, discussions with consultants, evaluation of patient's response to treatment, examination of patient, obtaining history from patient or surrogate, ordering and performing treatments and interventions, ordering and review of laboratory studies, ordering and review of radiographic studies, pulse oximetry and re-evaluation of patient's  condition.   Results for orders placed or performed during the hospital encounter of 50/53/97  Basic metabolic panel  Result Value Ref Range   Sodium 138 135 - 145 mmol/L   Potassium 3.6 3.5 - 5.1 mmol/L   Chloride 99 (L) 101 - 111 mmol/L   CO2 30 22 - 32 mmol/L   Glucose, Bld 276 (H) 65 - 99 mg/dL   BUN 10 6 - 20 mg/dL   Creatinine, Ser 0.86 0.61 - 1.24 mg/dL   Calcium 9.0 8.9 - 10.3 mg/dL   GFR calc non Af Amer >60 >60 mL/min   GFR calc Af Amer >60 >60 mL/min   Anion gap 9 5 - 15  Troponin I  Result Value Ref Range   Troponin I <0.03 <0.03 ng/mL  CBC with Differential  Result Value Ref Range   WBC 15.9 (H) 4.0 - 10.5 K/uL   RBC 5.23 4.22 - 5.81 MIL/uL   Hemoglobin 16.0 13.0 - 17.0 g/dL   HCT 46.9 39.0 - 52.0 %   MCV 89.7 78.0 - 100.0 fL   MCH 30.6 26.0 - 34.0 pg   MCHC 34.1 30.0 - 36.0 g/dL   RDW 13.6 11.5 - 15.5 %   Platelets 176 150 - 400 K/uL   Neutrophils Relative % 80 %   Neutro Abs 12.5 (H) 1.7 - 7.7 K/uL   Lymphocytes Relative 14 %   Lymphs Abs 2.3 0.7 - 4.0 K/uL   Monocytes Relative 5 %   Monocytes Absolute 0.8 0.1 - 1.0 K/uL   Eosinophils Relative 1 %   Eosinophils Absolute 0.2 0.0 - 0.7 K/uL   Basophils Relative 0 %   Basophils Absolute 0.0 0.0 - 0.1 K/uL   Dg Chest 2 View Result Date: 04/27/2017 CLINICAL DATA:  Dyspnea increased over the past 4 days. EXAM: CHEST  2 VIEW COMPARISON:  None. FINDINGS: The heart size and mediastinal contours are within normal limits. Mild increase in interstitial prominence may reflect bronchitic change more so on the right. No pneumonic consolidations. The visualized skeletal structures are unremarkable. IMPRESSION: Increased interstitial prominence more so the right may reflect bronchitic change. Exam is otherwise stable. Electronically Signed   By: Ashley Royalty M.D.   On: 04/27/2017 22:47    2340:  On arrival: pt sitting upright, tachypneic, tachycardic, Sats 92 % on O2 2L N/C (pt not on home O2), lungs diminished with  wheezing. Steroid and hour long neb started. After neb: pt appears more comfortable at rest, less tachypneic, Sats 95 % on O2 2L N/C, lungs continue diminished with scattered wheezing. Resting O2 Sat 88% R/A. Pt ambulated in hallway: pt's O2 Sats dropped to 85 % R/A with pt c/o increasing SOB, with increasing  HR and RR. Pt escorted back to stretcher with O2 Sats slowly increasing to 88-90 % on O2 2L N/C. Dx and testing d/w pt and family.  Questions answered.  Verb understanding, agreeable to admit. T/C to [facility]. Olevia Bowens, case discussed, including:  HPI, pertinent PM/SHx, VS/PE, dx testing, ED course and treatment:  Agreeable to admit.      Final Clinical Impressions(s) / ED Diagnoses   Final diagnoses:  None    New Prescriptions New Prescriptions   No medications on file     Francine Graven, DO 04/30/17 1944

## 2017-04-28 ENCOUNTER — Observation Stay (HOSPITAL_BASED_OUTPATIENT_CLINIC_OR_DEPARTMENT_OTHER): Payer: Self-pay

## 2017-04-28 ENCOUNTER — Encounter (HOSPITAL_COMMUNITY): Payer: Self-pay | Admitting: Internal Medicine

## 2017-04-28 DIAGNOSIS — J441 Chronic obstructive pulmonary disease with (acute) exacerbation: Secondary | ICD-10-CM

## 2017-04-28 DIAGNOSIS — D72823 Leukemoid reaction: Secondary | ICD-10-CM

## 2017-04-28 DIAGNOSIS — J9601 Acute respiratory failure with hypoxia: Secondary | ICD-10-CM

## 2017-04-28 DIAGNOSIS — E1165 Type 2 diabetes mellitus with hyperglycemia: Secondary | ICD-10-CM

## 2017-04-28 DIAGNOSIS — I10 Essential (primary) hypertension: Secondary | ICD-10-CM

## 2017-04-28 DIAGNOSIS — Z720 Tobacco use: Secondary | ICD-10-CM

## 2017-04-28 DIAGNOSIS — J9600 Acute respiratory failure, unspecified whether with hypoxia or hypercapnia: Secondary | ICD-10-CM

## 2017-04-28 LAB — MAGNESIUM: MAGNESIUM: 1.9 mg/dL (ref 1.7–2.4)

## 2017-04-28 LAB — RESPIRATORY PANEL BY PCR
ADENOVIRUS-RVPPCR: NOT DETECTED
Bordetella pertussis: NOT DETECTED
CORONAVIRUS HKU1-RVPPCR: NOT DETECTED
CORONAVIRUS NL63-RVPPCR: NOT DETECTED
Chlamydophila pneumoniae: NOT DETECTED
Coronavirus 229E: NOT DETECTED
Coronavirus OC43: NOT DETECTED
INFLUENZA A-RVPPCR: NOT DETECTED
Influenza B: NOT DETECTED
METAPNEUMOVIRUS-RVPPCR: NOT DETECTED
Mycoplasma pneumoniae: NOT DETECTED
PARAINFLUENZA VIRUS 1-RVPPCR: NOT DETECTED
PARAINFLUENZA VIRUS 3-RVPPCR: NOT DETECTED
PARAINFLUENZA VIRUS 4-RVPPCR: NOT DETECTED
Parainfluenza Virus 2: NOT DETECTED
RHINOVIRUS / ENTEROVIRUS - RVPPCR: NOT DETECTED
Respiratory Syncytial Virus: NOT DETECTED

## 2017-04-28 LAB — RAPID URINE DRUG SCREEN, HOSP PERFORMED
Amphetamines: NOT DETECTED
BENZODIAZEPINES: NOT DETECTED
Barbiturates: NOT DETECTED
Cocaine: NOT DETECTED
Opiates: NOT DETECTED
Tetrahydrocannabinol: NOT DETECTED

## 2017-04-28 LAB — ECHOCARDIOGRAM COMPLETE
AVLVOTPG: 4 mmHg
CHL CUP MV DEC (S): 144
CHL CUP STROKE VOLUME: 41 mL
E decel time: 144 msec
E/e' ratio: 14.8
FS: 33 % (ref 28–44)
Height: 70 in
IV/PV OW: 1.04
LA ID, A-P, ES: 26 mm
LA vol index: 14.8 mL/m2
LA vol: 35.7 mL
LADIAMINDEX: 1.08 cm/m2
LAVOLA4C: 45.3 mL
LEFT ATRIUM END SYS DIAM: 26 mm
LV E/e' medial: 14.8
LV E/e'average: 14.8
LV PW d: 11.3 mm — AB (ref 0.6–1.1)
LV TDI E'LATERAL: 8.92
LV TDI E'MEDIAL: 7.62
LV dias vol index: 30 mL/m2
LV sys vol index: 13 mL/m2
LVDIAVOL: 72 mL (ref 62–150)
LVELAT: 8.92 cm/s
LVOT VTI: 20.9 cm
LVOT area: 3.14 cm2
LVOT peak vel: 94.2 cm/s
LVOTD: 20 mm
LVOTSV: 66 mL
LVSYSVOL: 31 mL (ref 21–61)
Lateral S' vel: 12.7 cm/s
MVPG: 7 mmHg
MVPKAVEL: 96.8 m/s
MVPKEVEL: 132 m/s
Simpson's disk: 57
TAPSE: 21.2 mm
Weight: 4012.8 oz

## 2017-04-28 LAB — COMPREHENSIVE METABOLIC PANEL
ALK PHOS: 83 U/L (ref 38–126)
ALT: 34 U/L (ref 17–63)
AST: 20 U/L (ref 15–41)
Albumin: 3.8 g/dL (ref 3.5–5.0)
Anion gap: 9 (ref 5–15)
BILIRUBIN TOTAL: 0.6 mg/dL (ref 0.3–1.2)
BUN: 12 mg/dL (ref 6–20)
CALCIUM: 8.6 mg/dL — AB (ref 8.9–10.3)
CHLORIDE: 96 mmol/L — AB (ref 101–111)
CO2: 30 mmol/L (ref 22–32)
CREATININE: 0.74 mg/dL (ref 0.61–1.24)
Glucose, Bld: 272 mg/dL — ABNORMAL HIGH (ref 65–99)
Potassium: 4.7 mmol/L (ref 3.5–5.1)
Sodium: 135 mmol/L (ref 135–145)
Total Protein: 7.4 g/dL (ref 6.5–8.1)

## 2017-04-28 LAB — GLUCOSE, CAPILLARY
GLUCOSE-CAPILLARY: 344 mg/dL — AB (ref 65–99)
GLUCOSE-CAPILLARY: 202 mg/dL — AB (ref 65–99)
GLUCOSE-CAPILLARY: 322 mg/dL — AB (ref 65–99)
Glucose-Capillary: 350 mg/dL — ABNORMAL HIGH (ref 65–99)
Glucose-Capillary: 359 mg/dL — ABNORMAL HIGH (ref 65–99)

## 2017-04-28 LAB — URINALYSIS, COMPLETE (UACMP) WITH MICROSCOPIC
BACTERIA UA: NONE SEEN
BILIRUBIN URINE: NEGATIVE
Glucose, UA: >=500 mg/dL — AB
Hgb urine dipstick: NEGATIVE
Ketones, ur: NEGATIVE mg/dL
Leukocytes, UA: NEGATIVE
Nitrite: NEGATIVE
Protein, ur: 30 mg/dL — AB
SPECIFIC GRAVITY, URINE: 1.013 (ref 1.005–1.030)
SQUAMOUS EPITHELIAL / LPF: NONE SEEN
pH: 7 (ref 5.0–8.0)

## 2017-04-28 LAB — TROPONIN I: Troponin I: <0.03 ng/mL (ref <0.03)

## 2017-04-28 LAB — PHOSPHORUS: PHOSPHORUS: 1.7 mg/dL — AB (ref 2.5–4.6)

## 2017-04-28 MED ORDER — POTASSIUM PHOSPHATES 15 MMOLE/5ML IV SOLN
20.0000 meq | Freq: Once | INTRAVENOUS | Status: DC
  Filled 2017-04-28: qty 4.55

## 2017-04-28 MED ORDER — LISINOPRIL 10 MG PO TABS
20.0000 mg | ORAL_TABLET | Freq: Every day | ORAL | Status: DC
  Administered 2017-04-28 – 2017-04-29 (×2): 20 mg via ORAL
  Filled 2017-04-28 (×3): qty 2

## 2017-04-28 MED ORDER — INSULIN ASPART 100 UNIT/ML ~~LOC~~ SOLN
0.0000 [IU] | Freq: Three times a day (TID) | SUBCUTANEOUS | Status: DC
  Administered 2017-04-28: 15 [IU] via SUBCUTANEOUS

## 2017-04-28 MED ORDER — INSULIN ASPART 100 UNIT/ML ~~LOC~~ SOLN
4.0000 [IU] | Freq: Three times a day (TID) | SUBCUTANEOUS | Status: DC
  Administered 2017-04-28 – 2017-04-29 (×4): 4 [IU] via SUBCUTANEOUS

## 2017-04-28 MED ORDER — POTASSIUM PHOSPHATE MONOBASIC 500 MG PO TABS
500.0000 mg | ORAL_TABLET | Freq: Three times a day (TID) | ORAL | Status: DC
  Filled 2017-04-28 (×3): qty 1

## 2017-04-28 MED ORDER — MENTHOL 3 MG MT LOZG
1.0000 | LOZENGE | OROMUCOSAL | Status: DC | PRN
  Filled 2017-04-28: qty 9

## 2017-04-28 MED ORDER — IPRATROPIUM-ALBUTEROL 0.5-2.5 (3) MG/3ML IN SOLN
3.0000 mL | Freq: Four times a day (QID) | RESPIRATORY_TRACT | Status: DC
  Administered 2017-04-28 – 2017-04-29 (×7): 3 mL via RESPIRATORY_TRACT
  Filled 2017-04-28 (×7): qty 3

## 2017-04-28 MED ORDER — KETOROLAC TROMETHAMINE 30 MG/ML IJ SOLN
30.0000 mg | Freq: Once | INTRAMUSCULAR | Status: AC
  Administered 2017-04-28: 30 mg via INTRAVENOUS
  Filled 2017-04-28: qty 1

## 2017-04-28 MED ORDER — POTASSIUM PHOSPHATES 15 MMOLE/5ML IV SOLN
20.0000 meq | Freq: Once | INTRAVENOUS | Status: AC
  Administered 2017-04-28: 20 meq via INTRAVENOUS
  Filled 2017-04-28: qty 4.55

## 2017-04-28 MED ORDER — POTASSIUM CHLORIDE IN NACL 20-0.45 MEQ/L-% IV SOLN
INTRAVENOUS | Status: AC
  Administered 2017-04-28: 03:00:00 via INTRAVENOUS
  Filled 2017-04-28 (×2): qty 1000

## 2017-04-28 MED ORDER — ALBUTEROL SULFATE (2.5 MG/3ML) 0.083% IN NEBU: 2.5000 mg | INHALATION_SOLUTION | Freq: Four times a day (QID) | RESPIRATORY_TRACT | Status: DC

## 2017-04-28 MED ORDER — ONDANSETRON HCL 4 MG PO TABS: 4.0000 mg | ORAL_TABLET | Freq: Four times a day (QID) | ORAL | Status: DC | PRN

## 2017-04-28 MED ORDER — PNEUMOCOCCAL VAC POLYVALENT 25 MCG/0.5ML IJ INJ
0.5000 mL | INJECTION | INTRAMUSCULAR | Status: AC
  Administered 2017-04-29: 0.5 mL via INTRAMUSCULAR
  Filled 2017-04-28: qty 0.5

## 2017-04-28 MED ORDER — NICOTINE 21 MG/24HR TD PT24
21.0000 mg | MEDICATED_PATCH | Freq: Every day | TRANSDERMAL | Status: DC
  Administered 2017-04-28 – 2017-04-29 (×2): 21 mg via TRANSDERMAL
  Filled 2017-04-28 (×3): qty 1

## 2017-04-28 MED ORDER — PERFLUTREN LIPID MICROSPHERE
1.0000 mL | INTRAVENOUS | Status: AC | PRN
  Administered 2017-04-28 (×2): 1 mL via INTRAVENOUS
  Filled 2017-04-28: qty 10

## 2017-04-28 MED ORDER — ACETAMINOPHEN 325 MG PO TABS: 650.0000 mg | ORAL_TABLET | Freq: Four times a day (QID) | ORAL | Status: DC | PRN

## 2017-04-28 MED ORDER — METHYLPREDNISOLONE SODIUM SUCC 125 MG IJ SOLR
60.0000 mg | Freq: Three times a day (TID) | INTRAMUSCULAR | Status: DC
  Administered 2017-04-28 – 2017-04-29 (×3): 60 mg via INTRAVENOUS
  Filled 2017-04-28 (×3): qty 2

## 2017-04-28 MED ORDER — AMLODIPINE BESYLATE 5 MG PO TABS
5.0000 mg | ORAL_TABLET | Freq: Every day | ORAL | Status: DC
  Administered 2017-04-28 – 2017-04-29 (×2): 5 mg via ORAL
  Filled 2017-04-28 (×2): qty 1

## 2017-04-28 MED ORDER — LEVOFLOXACIN IN D5W 750 MG/150ML IV SOLN
750.0000 mg | INTRAVENOUS | Status: DC
  Administered 2017-04-28: 750 mg via INTRAVENOUS
  Filled 2017-04-28: qty 150

## 2017-04-28 MED ORDER — BUDESONIDE 0.25 MG/2ML IN SUSP
0.2500 mg | Freq: Two times a day (BID) | RESPIRATORY_TRACT | Status: DC
  Administered 2017-04-28 – 2017-04-29 (×3): 0.25 mg via RESPIRATORY_TRACT
  Filled 2017-04-28 (×3): qty 2

## 2017-04-28 MED ORDER — ONDANSETRON HCL 4 MG/2ML IJ SOLN: 4.0000 mg | Freq: Four times a day (QID) | INTRAMUSCULAR | Status: DC | PRN

## 2017-04-28 MED ORDER — METHYLPREDNISOLONE SODIUM SUCC 125 MG IJ SOLR
60.0000 mg | Freq: Three times a day (TID) | INTRAMUSCULAR | Status: DC
Start: 2017-04-28 — End: 2017-04-28
  Administered 2017-04-28: 60 mg via INTRAVENOUS
  Filled 2017-04-28: qty 2

## 2017-04-28 MED ORDER — ENOXAPARIN SODIUM 40 MG/0.4ML ~~LOC~~ SOLN
40.0000 mg | SUBCUTANEOUS | Status: DC
  Administered 2017-04-28 – 2017-04-29 (×2): 40 mg via SUBCUTANEOUS
  Filled 2017-04-28 (×2): qty 0.4

## 2017-04-28 MED ORDER — IPRATROPIUM BROMIDE 0.02 % IN SOLN: 0.5000 mg | Freq: Four times a day (QID) | RESPIRATORY_TRACT | Status: DC

## 2017-04-28 MED ORDER — MAGNESIUM SULFATE 2 GM/50ML IV SOLN
2.0000 g | Freq: Once | INTRAVENOUS | Status: AC
  Administered 2017-04-28: 2 g via INTRAVENOUS
  Filled 2017-04-28: qty 50

## 2017-04-28 MED ORDER — ALBUTEROL SULFATE (2.5 MG/3ML) 0.083% IN NEBU: 2.5000 mg | INHALATION_SOLUTION | RESPIRATORY_TRACT | Status: DC | PRN

## 2017-04-28 MED ORDER — INSULIN ASPART 100 UNIT/ML ~~LOC~~ SOLN
0.0000 [IU] | Freq: Three times a day (TID) | SUBCUTANEOUS | Status: DC
  Administered 2017-04-28: 15 [IU] via SUBCUTANEOUS
  Administered 2017-04-28: 7 [IU] via SUBCUTANEOUS
  Administered 2017-04-29: 15 [IU] via SUBCUTANEOUS
  Administered 2017-04-29: 7 [IU] via SUBCUTANEOUS

## 2017-04-28 MED ORDER — INSULIN ASPART 100 UNIT/ML ~~LOC~~ SOLN
0.0000 [IU] | Freq: Every day | SUBCUTANEOUS | Status: DC
  Administered 2017-04-28: 5 [IU] via SUBCUTANEOUS

## 2017-04-28 NOTE — Care Management Note (Addendum)
Case Management Note  Patient Details  Name: Larry Lam MRN: [id number] Date of Birth: [birth year]  Subjective/Objective:                  Admitted with COPD exacerbation. Pt is from home, lives with gf. He is ind with ADL's. 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 Pt states he stopped taking medications regularly 3 years ago when his medicare was revoked. He also was on HS oxygen which was taken back also. He has no PCP but has been told by a friend to go to the [facility] in [place]. Pt states his gf's son uses neb tx and he sometimes uses his machine. Pt does not have CBG meter, he has DM but does not check blood sugars.   Action/Plan: Pt will need neb machine, Kathy, [facility] rep, aware of referral and will deliver DME to pt room prior to DC. Pt will be given [facility] voucher for first 30-days of meds. He will be given resources on CBG meter/supply costs. He will need to wean from oxygen prior to DC as going home with oxygen is not option with no PCP established. CM will provide list of places pt may go to establish care. Pt must be the one to establish care, he understands this. CM will cont to follow.    Expected Discharge Date:  04/29/17               Expected Discharge Plan:  Home/Self Care  In-House Referral:  NA  Discharge planning Services  CM Consult, [place], [facility]  Post Acute Care Choice:  NA Choice offered to:  Patient  DME Arranged:  Nebulizer machine DME Agency:  [place]:    [facility] Agency:     Status of Service:  In process, will continue to follow  If discussed at Long Length of Stay Meetings, dates discussed:    Additional Comments:  Sherald Barge, RN 04/28/2017, 1:48 PM

## 2017-04-28 NOTE — Progress Notes (Signed)
*  PRELIMINARY RESULTS* Echocardiogram 2D Echocardiogram has been performed with Definity.  Samuel Germany 04/28/2017, 9:59 AM

## 2017-04-28 NOTE — Progress Notes (Signed)
PROGRESS NOTE  Larry Lam DUK:[id number] DOB: [birth year] DOA: 04/27/2017 PCP: System, Pcp Not In  Brief History:  45 year old male with a history of diabetes mellitus, hypertension, COPD presented with 4 day history of nonproductive cough, shortness breath, and wheezing. In addition, the patient states that he has had some dyspnea on exertion and exertional chest discomfort for several weeks prior to admission. He denies any fevers, chills, headache, neck pain, nausea, vomiting, diarrhea, abdominal pain, dysuria, hematuria. He denies any hemoptysis, hematochezia, melena. He continues to smoke 1 pack per day. The patient has not seen a physician in 2-3 years; therefore, he has not taken any medications during this period of time. In addition, the patient was previously on home oxygen at 2 L, but he has not used any oxygen during this period of time. Upon presentation, the patient was noted to have temperature 99.51F with blood pressure 207/92. He was given prednisone 60 mg and started on bronchodilators.  Assessment/Plan: Acute respiratory failure with hypoxia -Stable 2 L nasal cannula saturating 95-96 percent -Secondary to COPD exacerbation  COPD exacerbation -Start Solu-Medrol -Start Pulmicort -Continue duo nebs -Check ambulatory pulse oximetry prior to discharge  Diabetes mellitus type 2, uncontrolled -Hemoglobin A1c -NovoLog sliding scale -Patient states his previously was on insulin and metformin -Anticipate elevated CBGs secondary to steroids  Essential hypertension, poorly controlled -Restart lisinopril -add amlodipine  Exertional dyspnea and chest discomfort -Echocardiogram -Cycle troponins -personally reviewed EKG--sinus, nonspecific T-wave changes -personally reviewed CXR--no consolidation; interstitial prominence  Tobacco abuse -Tobacco cessation discussed -NicoDerm patch  Leukocytosis -UA and urine culture -Personally reviewed chest x-ray--no  consolidations, noted interstitial prominence -Urine drug screen -Viral respiratory panel  Hypophosphatemia -Replete  -Replete phosphorus     Disposition Plan:   Home in 1-2 days  Family Communication:   No Family at bedside  Consultants:  none  Code Status:  FULL  DVT Prophylaxis:  Poca Lovenox   Procedures: As Listed in Progress Note Above  Antibiotics: Levofloxacin 9/2    Subjective:  patient says that he is breathing better but still having some dyspnea. Denies any fevers, chills, chest pain, nausea, vomiting, diarrhea, abdominal pain, dysuria, hematuria. Denies medication or melena.  Objective: Vitals:   04/28/17 0220 04/28/17 0357 04/28/17 0542 04/28/17 0748  BP: (!) 182/93  (!) 161/82   Pulse: 86  90   Resp: 20  20   Temp: 98.8 F (37.1 C)  97.9 F (36.6 C)   TempSrc: Oral  Oral   SpO2: 95% 95% 96% 94%  Weight: 113.8 kg (250 lb 12.8 oz)     Height: 5\' 10"  (1.778 m)       Intake/Output Summary (Last 24 hours) at 04/28/17 0853 Last data filed at 04/28/17 0600  Gross per 24 hour  Intake           589.58 ml  Output                0 ml  Net           589.58 ml   Weight change:  Exam:   General:  Pt is alert, follows commands appropriately, not in acute distress  HEENT: No icterus, No thrush, No neck mass, [place]/AT  Cardiovascular: RRR, S1/S2, no rubs, no gallops  Respiratory:Bibasilar rales with bibasilar wheeze. Good air movement.   Abdomen: Soft/+BS, non tender, non distended, no guarding  Extremities: No edema, No lymphangitis, No petechiae, No rashes, no synovitis  Data Reviewed: I have personally reviewed following labs and imaging studies Basic Metabolic Panel:  Recent Labs Lab 04/27/17 2103  NA 138  K 3.6  CL 99*  CO2 30  GLUCOSE 276*  BUN 10  CREATININE 0.86  CALCIUM 9.0  MG 1.9  PHOS 1.7*   Liver Function Tests: No results for input(s): AST, ALT, ALKPHOS, BILITOT, PROT, ALBUMIN in the last 168 hours. No results for  input(s): LIPASE, AMYLASE in the last 168 hours. No results for input(s): AMMONIA in the last 168 hours. Coagulation Profile: No results for input(s): INR, PROTIME in the last 168 hours. CBC:  Recent Labs Lab 04/27/17 2103  WBC 15.9*  NEUTROABS 12.5*  HGB 16.0  HCT 46.9  MCV 89.7  PLT 176   Cardiac Enzymes:  Recent Labs Lab 04/27/17 2103  TROPONINI <0.03   BNP: Invalid input(s): POCBNP CBG:  Recent Labs Lab 04/28/17 0226 04/28/17 0728  GLUCAP 350* 344*   HbA1C: No results for input(s): HGBA1C in the last 72 hours. Urine analysis:    Component Value Date/Time   COLORURINE Straw 07/05/2012 0103   APPEARANCEUR Clear 07/05/2012 0103   LABSPEC 1.033 07/05/2012 0103   PHURINE 6.0 07/05/2012 0103   GLUCOSEU >=500 07/05/2012 0103   HGBUR Negative 07/05/2012 0103   BILIRUBINUR Negative 07/05/2012 0103   KETONESUR Negative 07/05/2012 0103   PROTEINUR Negative 07/05/2012 0103   NITRITE Negative 07/05/2012 0103   LEUKOCYTESUR Negative 07/05/2012 0103   Sepsis Labs: @LABRCNTIP (procalcitonin:4,lacticidven:4) )No results found for this or any previous visit (from the past 240 hour(s)).   Scheduled Meds: . enoxaparin (LOVENOX) injection  40 mg Subcutaneous Q24H  . insulin aspart  0-20 Units Subcutaneous TID WC  . ipratropium-albuterol  3 mL Nebulization Q6H  . lisinopril  20 mg Oral Daily  . nicotine  21 mg Transdermal Daily  . [START ON 04/29/2017] pneumococcal 23 valent vaccine  0.5 mL Intramuscular Tomorrow-1000   Continuous Infusions: . 0.45 % NaCl with KCl 20 mEq / L 125 mL/hr at 04/28/17 0253  . levofloxacin (LEVAQUIN) IV Stopped (04/28/17 0457)  . potassium PHOSPHATE IVPB (mEq) 20 mEq (04/28/17 0849)    Procedures/Studies: Dg Chest 2 View  Result Date: 04/27/2017 CLINICAL DATA:  Dyspnea increased over the past 4 days. EXAM: CHEST  2 VIEW COMPARISON:  None. FINDINGS: The heart size and mediastinal contours are within normal limits. Mild increase in  interstitial prominence may reflect bronchitic change more so on the right. No pneumonic consolidations. The visualized skeletal structures are unremarkable. IMPRESSION: Increased interstitial prominence more so the right may reflect bronchitic change. Exam is otherwise stable. Electronically Signed   By: Ashley Royalty M.D.   On: 04/27/2017 22:47    Roseline Ebarb, DO  [facility] Pager [phone]  If 7PM-7AM, please contact night-coverage [email or web address] Password [email or web address] 04/28/2017, 8:53 AM   LOS: 0 days

## 2017-04-28 NOTE — Progress Notes (Signed)
Initial Nutrition Assessment  DOCUMENTATION CODES:   Obesity unspecified  INTERVENTION:  Provided CHO mod/ Heart Healthy handouts  Heart Healthy/ CHO modified diet encouraged   NUTRITION DIAGNOSIS:   Limited adherence to nutrition-related recommendations related to   as evidenced by per patient/family report who eats without regard to therapeutic diet recommendations.   GOAL:    (Patient will be compliant with diet perscription during hospitalization)   MONITOR:   PO intake, Labs, Weight trends  REASON FOR ASSESSMENT:   Consult COPD Protocol  ASSESSMENT:   46 yo presents with COPD exacerbation. Hx of DM and HTN and consumes regular diet at home. His wt is up 20# since March and he doesn't know when the last time his A1C was checked.  Excellent appetite 75-100% of meals conusme and no depletions of muscle or fat mass.  Recent Labs Lab 04/27/17 2103 04/28/17 0821  NA 138 135  K 3.6 4.7  CL 99* 96*  CO2 30 30  BUN 10 12  CREATININE 0.86 0.74  CALCIUM 9.0 8.6*  MG 1.9  --   PHOS 1.7*  --   GLUCOSE 276* 272*   labs: SSI, potassium  Meds: steroids  Diet Order:  Diet heart healthy/carb modified Room service appropriate? Yes; Fluid consistency: Thin  Skin:  Reviewed, no issues  Last BM:  9/2  Height:   Ht Readings from Last 1 Encounters:  04/28/17 5\' 10"  (1.778 m)    Weight:   Wt Readings from Last 1 Encounters:  04/28/17 250 lb 12.8 oz (113.8 kg)    Ideal Body Weight:  75 kg  BMI:  Body mass index is 35.99 kg/m.  Estimated Nutritional Needs:   Kcal:  2022-2194   Protein:  113-125 gr  Fluid:  2.0-2.2 liters daily  EDUCATION NEEDS:   Education needs addressed (CHO modified/ Low Sodium diet encouraged and provided [facility] Modified/Heart Healthy handouts)  Colman Cater MS,RD,CSG,LDN Office: [phone] Pager: [phone]

## 2017-04-28 NOTE — Clinical Social Work Note (Signed)
CSW received consult for medication assistance. Will notify CM. Signing off.  Benay Pike, [place]

## 2017-04-29 DIAGNOSIS — R079 Chest pain, unspecified: Secondary | ICD-10-CM

## 2017-04-29 LAB — CBC WITH DIFFERENTIAL/PLATELET
Basophils Absolute: 0 10*3/uL (ref 0.0–0.1)
Basophils Relative: 0 %
EOS ABS: 0 10*3/uL (ref 0.0–0.7)
EOS PCT: 0 %
HCT: 49.7 % (ref 39.0–52.0)
Hemoglobin: 16.4 g/dL (ref 13.0–17.0)
Lymphocytes Relative: 10 %
Lymphs Abs: 2.3 10*3/uL (ref 0.7–4.0)
MCH: 29.6 pg (ref 26.0–34.0)
MCHC: 33 g/dL (ref 30.0–36.0)
MCV: 89.7 fL (ref 78.0–100.0)
MONO ABS: 0.7 10*3/uL (ref 0.1–1.0)
Monocytes Relative: 3 %
NEUTROS PCT: 87 %
Neutro Abs: 20 10*3/uL — ABNORMAL HIGH (ref 1.7–7.7)
PLATELETS: 218 10*3/uL (ref 150–400)
RBC: 5.54 MIL/uL (ref 4.22–5.81)
RDW: 13.3 % (ref 11.5–15.5)
WBC: 23 10*3/uL — AB (ref 4.0–10.5)

## 2017-04-29 LAB — GLUCOSE, CAPILLARY
GLUCOSE-CAPILLARY: 249 mg/dL — AB (ref 65–99)
Glucose-Capillary: 328 mg/dL — ABNORMAL HIGH (ref 65–99)

## 2017-04-29 LAB — BASIC METABOLIC PANEL
ANION GAP: 9 (ref 5–15)
BUN: 16 mg/dL (ref 6–20)
CO2: 28 mmol/L (ref 22–32)
Calcium: 9.2 mg/dL (ref 8.9–10.3)
Chloride: 100 mmol/L — ABNORMAL LOW (ref 101–111)
Creatinine, Ser: 0.59 mg/dL — ABNORMAL LOW (ref 0.61–1.24)
GFR calc Af Amer: >60 mL/min (ref >60)
Glucose, Bld: 247 mg/dL — ABNORMAL HIGH (ref 65–99)
POTASSIUM: 4.7 mmol/L (ref 3.5–5.1)
SODIUM: 137 mmol/L (ref 135–145)

## 2017-04-29 LAB — HEMOGLOBIN A1C
HEMOGLOBIN A1C: 8.9 % — AB (ref 4.8–5.6)
Mean Plasma Glucose: 208.73 mg/dL

## 2017-04-29 LAB — PHOSPHORUS: Phosphorus: 3.1 mg/dL (ref 2.5–4.6)

## 2017-04-29 MED ORDER — GLIPIZIDE 5 MG PO TABS
5.0000 mg | ORAL_TABLET | Freq: Two times a day (BID) | ORAL | 0 refills | Status: DC
Start: 2017-04-29 — End: 2019-08-13

## 2017-04-29 MED ORDER — AMLODIPINE BESYLATE 5 MG PO TABS: 10.0000 mg | ORAL_TABLET | Freq: Every day | ORAL | Status: DC

## 2017-04-29 MED ORDER — METOPROLOL TARTRATE 25 MG PO TABS
25.0000 mg | ORAL_TABLET | Freq: Two times a day (BID) | ORAL | Status: DC
Start: 2017-04-29 — End: 2017-04-29

## 2017-04-29 MED ORDER — PREDNISONE 20 MG PO TABS: 60.0000 mg | ORAL_TABLET | Freq: Every day | ORAL | Status: DC

## 2017-04-29 MED ORDER — INSULIN GLARGINE 100 UNIT/ML ~~LOC~~ SOLN
10.0000 [IU] | Freq: Every day | SUBCUTANEOUS | Status: DC
  Filled 2017-04-29 (×2): qty 0.1

## 2017-04-29 MED ORDER — AMLODIPINE BESYLATE 10 MG PO TABS: 10.0000 mg | ORAL_TABLET | Freq: Every day | ORAL | 0 refills | Status: DC

## 2017-04-29 MED ORDER — LISINOPRIL 20 MG PO TABS: 20.0000 mg | ORAL_TABLET | Freq: Every day | ORAL | 0 refills | Status: DC

## 2017-04-29 MED ORDER — ALBUTEROL SULFATE HFA 108 (90 BASE) MCG/ACT IN AERS: 2.0000 | INHALATION_SPRAY | Freq: Four times a day (QID) | RESPIRATORY_TRACT | 0 refills | Status: DC | PRN

## 2017-04-29 MED ORDER — IPRATROPIUM-ALBUTEROL 0.5-2.5 (3) MG/3ML IN SOLN: 3.0000 mL | Freq: Four times a day (QID) | RESPIRATORY_TRACT | 0 refills | Status: AC | PRN

## 2017-04-29 MED ORDER — METFORMIN HCL 500 MG PO TABS: 500.0000 mg | ORAL_TABLET | Freq: Two times a day (BID) | ORAL | 0 refills | Status: DC

## 2017-04-29 MED ORDER — PREDNISONE 10 MG PO TABS: 60.0000 mg | ORAL_TABLET | Freq: Every day | ORAL | 0 refills | Status: DC

## 2017-04-29 MED ORDER — METOPROLOL TARTRATE 25 MG PO TABS: 25.0000 mg | ORAL_TABLET | Freq: Two times a day (BID) | ORAL | 0 refills | Status: DC

## 2017-04-29 MED ORDER — INSULIN ASPART 100 UNIT/ML ~~LOC~~ SOLN: 6.0000 [IU] | Freq: Three times a day (TID) | SUBCUTANEOUS | Status: DC

## 2017-04-29 NOTE — Care Management (Signed)
    Durable Medical Equipment        Start     Ordered   04/29/17 1339  For home use only DME oxygen  Once    Question Answer Comment  Mode or (Route) Nasal cannula   Liters per Minute 2   Oxygen delivery system Gas      04/29/17 1338   04/28/17 1348  For home use only DME Nebulizer machine  Once    Question:  Patient needs a nebulizer to treat with the following condition  Answer:  COPD (chronic obstructive pulmonary disease) ([place])   04/28/17 1348     Pt will need supplemental home oxygen because alternate treatments (IV steroids and duo-nebs) have been tried and found to be insufficient.

## 2017-04-29 NOTE — Care Management Note (Signed)
Case Management Note  Patient Details  Name: Larry Lam MRN: [id number] Date of Birth: 17-Mar-1971  Expected Discharge Date:  04/29/17               Expected Discharge Plan:  Home/Self Care  In-House Referral:  NA  Discharge planning Services  CM Consult, [place] [facility], [facility], Follow-up appt scheduled  Post Acute Care Choice:  Durable Medical Equipment Choice offered to:  Patient  DME Arranged:  Nebulizer machine, Oxygen DME Agency:  [place].  Status of Service: completed.   Additional Comments: Pt discharging home today. Pt has been given neb machine. Pt has been made f/u appointment at the [facility] in [place]. [facility] has agreed to provide pt with supplemental oxygen at DC. Pt given MATCH voucher and information on [employer] brand blood glucose supplies. Pt communicates no other questions or concerns.   Sherald Barge, RN 04/29/2017, 2:42 PM

## 2017-04-29 NOTE — Progress Notes (Addendum)
Inpatient Diabetes Program Recommendations  AACE/ADA: New Consensus Statement on Inpatient Glycemic Control (2015)  Target Ranges:  Prepandial:   less than 140 mg/dL      Peak postprandial:   less than 180 mg/dL (1-2 hours)      Critically ill patients:  140 - 180 mg/dL   Results for RHET, RORKE (MRN [id number]) as of 04/29/2017 09:02  Ref. Range 04/28/2017 07:28 04/28/2017 11:49 04/28/2017 16:11 04/28/2017 22:02  Glucose-Capillary Latest Ref Range: 65 - 99 mg/dL 344 (H) 202 (H) 322 (H) 359 (H)   Results for NEEKO, PHARO (MRN [id number]) as of 04/29/2017 09:02  Ref. Range 04/29/2017 07:25  Glucose-Capillary Latest Ref Range: 65 - 99 mg/dL 249 (H)    Admit with: COPD  History: DM  Home DM Meds: Metformin 500 mg daily (not taking)  Current Insulin Orders: Novolog Resistant Correction Scale/ SSI (0-20 units) TID AC + HS      Novolog 4 units TID with meals      MD- Note patient getting Solumedrol 60 mg Q8 hours.  Current Hemoglobin A1c level pending.  Please consider the following in-hospital insulin adjustments:  1. Start basal insulin: Lantus 15 units daily (~0.15 units/kg dosing)  2. Increase Novolog Meal Coverage to: Novolog 6 units TID with meals (hold if pt eats <50% of meal)  3. If Hemoglobin A1c results are elevated and patient needs insulin at time of discharge, please consider affordable insulin as patient does not have health insurance.  Recommend 70/30 insulin for home if patient will need insulin at time of d/c.  Patient can purchase 70/30 insulin at [facility] for $25 per vial.       Addendum 9:30am- Spoke with pt by phone (DM Coordinator not physically present on [place] today).  Pt told me he stopped his once daily insulin injection (could not remember the name of the insulin) and his Metformin over 2 years ago.  Pt stated to me that his PCP at the time said his glucose results were good and that he didn't need to take medicine anymore.  Currently  does not have a PCP and does not heave health insurance.  Pt told me he plans to look into the [facility] to seek medical care after discharge.  I also recommended to pt that he call the [facility] Department to seek care there as well.  Discussed with pt that we are currently waiting on his Hemoglobin A1c level to determine what kind of medicine (insulin or oral meds) to send him home on for his diabetes.  Discussed with pt that [facility] has many affordable oral medications options ($4 each) and that [facility] also has insulin (70/30 insulin) for $25 per vial.  Pt has CBG meter at home but cannot afford strips.  Recommended to pt that he purchase a CBG meter and strips from [facility].  CBG meter is $9 and 100-count strips is $18.  Encouraged pt to check his CBGs at last BID at home (fasting and either before another meal or 2-hours after meal for the 2nd check).  Reviewed CBG goals and A1c goals for home.     --Will follow patient during hospitalization--  Wyn Quaker RN, MSN, CDE Diabetes Coordinator Inpatient Glycemic Control Team Team Pager: [phone] (8a-5p)

## 2017-04-29 NOTE — Discharge Instructions (Signed)
Sugar goals for home: Before meals 80-130 mg/dl 2-hours after meals less than 180 mg/dl  Hemoglobin A1c goal for home= 7% or less  CBG meter and strips at [facility] ([employer]).  Meter is $9 and a box of 50 strips is $9.

## 2017-04-29 NOTE — Progress Notes (Signed)
SATURATION QUALIFICATIONS: (This note is used to comply with regulatory documentation for home oxygen)  Patient Saturations on Room Air at Rest = 91%  Patient Saturations on Room Air while Ambulating = 87%  Patient Saturations on 2 Liters of oxygen after Ambulation = 94%  Please briefly explain why patient needs home oxygen: To maintain 02 sat at 92% or above during ambulation.  DTat

## 2017-04-29 NOTE — Discharge Summary (Signed)
Physician Discharge Summary  Larry Lam SAY:[id number] DOB: 14-Jul-1971 DOA: 04/27/2017  PCP: System, Pcp Not In  Admit date: 04/27/2017 Discharge date: 04/29/2017  Admitted From: Home Disposition:  Home   Recommendations for Outpatient Follow-up:  1. Follow up with PCP in 1-2 weeks 2. Please obtain BMP/CBC in one week   Home Health:YES Equipment/Devices: 2 L oxygen  Discharge Condition: Stable CODE STATUS:FULL Diet recommendation: Heart Healthy / Carb Modified    Brief/Interim Summary: 46 year old male with a history of diabetes mellitus, hypertension, COPD presented with 4 day history of nonproductive cough, shortness breath, and wheezing. In addition, the patient states that he has had some dyspnea on exertion and exertional chest discomfort for several weeks prior to admission. He denies any fevers, chills, headache, neck pain, nausea, vomiting, diarrhea, abdominal pain, dysuria, hematuria. He denies any hemoptysis, hematochezia, melena. He continues to smoke 1 pack per day. The patient has not seen a physician in 2-3 years; therefore, he has not taken any medications during this period of time. In addition, the patient was previously on home oxygen at 2 L, but he has not used any oxygen during this period of time. Upon presentation, the patient was noted to have temperature 99.12F with blood pressure 207/92. He was given prednisone 60 mg and started on bronchodilators.  The patient was subsequently started on IV solumedrol with good clinical improvement.  He will be discharged home with prednisone taper.  Discharge Diagnoses:  Acute respiratory failure with hypoxia -Stable 2 L nasal cannula saturating 95-96 percent -Secondary to COPD exacerbation  COPD exacerbation -Start Solu-Medrol-->home with prednisone taper -Start Pulmicort -Continue duo nebs -Check ambulatory pulse oximetry prior to discharge--desaturated with ambulation -pt will go home with 2L  oxygen  Diabetes mellitus type 2, uncontrolled -Hemoglobin A1c--8.9 -NovoLog sliding scale -home with metformin and glipizide -Anticipate elevated CBGs secondary to steroids  Essential hypertension, poorly controlled -Restart lisinopril -add amlodipine-->increase to 10 mg daily -add metoprolol tartrate 25 mg bid  Exertional dyspnea and chest discomfort -Echocardiogram--EF 55-60%, no WMAA, trivial TR -Cycle troponins--neg x 3 -personally reviewed EKG--sinus, nonspecific T-wave changes -personally reviewed CXR--no consolidation; interstitial prominence  Tobacco abuse -Tobacco cessation discussed -NicoDerm patch  Leukocytosis -UA --neg for pyuria -UDS neg -Personally reviewed chest x-ray--no consolidations, noted interstitial prominence -Viral respiratory panel--neg  Hypophosphatemia -Repleted   Discharge Instructions  Discharge Instructions    Diet - low sodium heart healthy    Complete by:  As directed    Increase activity slowly    Complete by:  As directed      Allergies as of 04/29/2017   No Known Allergies     Medication List    TAKE these medications   albuterol 108 (90 Base) MCG/ACT inhaler Commonly known as:  PROVENTIL HFA;VENTOLIN HFA Inhale 2 puffs into the lungs every 6 (six) hours as needed for wheezing or shortness of breath.   amLODipine 10 MG tablet Commonly known as:  NORVASC Take 1 tablet (10 mg total) by mouth daily.   glipiZIDE 5 MG tablet Commonly known as:  GLUCOTROL Take 1 tablet (5 mg total) by mouth 2 (two) times daily.   ipratropium-albuterol 0.5-2.5 (3) MG/3ML Soln Commonly known as:  DUONEB Take 3 mLs by nebulization every 6 (six) hours as needed. J44.9 and J44.1   lisinopril 20 MG tablet Commonly known as:  PRINIVIL,ZESTRIL Take 1 tablet (20 mg total) by mouth daily. What changed:  medication strength  how much to take   metFORMIN 500 MG tablet Commonly known  as:  GLUCOPHAGE Take 1 tablet (500 mg total) by mouth  2 (two) times daily with a meal. What changed:  when to take this   metoprolol tartrate 25 MG tablet Commonly known as:  LOPRESSOR Take 1 tablet (25 mg total) by mouth 2 (two) times daily.   predniSONE 10 MG tablet Commonly known as:  DELTASONE Take 6 tablets (60 mg total) by mouth daily with breakfast. And decrease by one tablet daily            Durable Medical Equipment        Start     Ordered   04/29/17 1526  For home use only DME oxygen  Once    Question Answer Comment  Mode or (Route) Nasal cannula   Liters per Minute 2   Frequency Continuous (stationary and portable oxygen unit needed)   Oxygen delivery system Gas      04/29/17 1526   04/28/17 1348  For home use only DME Nebulizer machine  Once    Question:  Patient needs a nebulizer to treat with the following condition  Answer:  COPD (chronic obstructive pulmonary disease) ([place])   04/28/17 1348       Discharge Care Instructions        Start     Ordered   04/30/17 0000  amLODipine (NORVASC) 10 MG tablet  Daily     04/29/17 1347   04/30/17 0000  lisinopril (PRINIVIL,ZESTRIL) 20 MG tablet  Daily     04/29/17 1347   04/30/17 0000  predniSONE (DELTASONE) 10 MG tablet  Daily with breakfast     04/29/17 1348   04/29/17 0000  albuterol (PROVENTIL HFA;VENTOLIN HFA) 108 (90 Base) MCG/ACT inhaler  Every 6 hours PRN    Comments:  J44.9 and J44.1   04/29/17 1347   04/29/17 0000  ipratropium-albuterol (DUONEB) 0.5-2.5 (3) MG/3ML SOLN  Every 6 hours PRN     04/29/17 1347   04/29/17 0000  metFORMIN (GLUCOPHAGE) 500 MG tablet  2 times daily with meals     04/29/17 1347   04/29/17 0000  metoprolol tartrate (LOPRESSOR) 25 MG tablet  2 times daily     04/29/17 1347   04/29/17 0000  glipiZIDE (GLUCOTROL) 5 MG tablet  2 times daily     04/29/17 1347   04/29/17 0000  Increase activity slowly     04/29/17 1347   04/29/17 0000  Diet - low sodium heart healthy     04/29/17 [name], [name] Follow up on 05/02/2017.   Specialty:  General Practice Why:  @ 2:20PM Contact information: [place] [address] [phone]          No Known Allergies  Consultations:  none   Procedures/Studies: Dg Chest 2 View  Result Date: 04/27/2017 CLINICAL DATA:  Dyspnea increased over the past 4 days. EXAM: CHEST  2 VIEW COMPARISON:  None. FINDINGS: The heart size and mediastinal contours are within normal limits. Mild increase in interstitial prominence may reflect bronchitic change more so on the right. No pneumonic consolidations. The visualized skeletal structures are unremarkable. IMPRESSION: Increased interstitial prominence more so the right may reflect bronchitic change. Exam is otherwise stable. Electronically Signed   By: Ashley Royalty M.D.   On: 04/27/2017 22:47        Discharge Exam: Vitals:   04/29/17 0731 04/29/17 1422  BP:    Pulse:    Resp:  Temp:    SpO2: (!) 86% 92%   Vitals:   04/29/17 0500 04/29/17 0725 04/29/17 0731 04/29/17 1422  BP: (!) 160/57     Pulse: 95     Resp:      Temp: 98.2 F (36.8 C)     TempSrc: Oral     SpO2: 93% (!) 86% (!) 86% 92%  Weight:      Height:        General: Pt is alert, awake, not in acute distress Cardiovascular: RRR, S1/S2 +, no rubs, no gallops Respiratory: bibasilar rales bilaterally, no wheezing, no rhonchi Abdominal: Soft, NT, ND, bowel sounds + Extremities: no edema, no cyanosis   The results of significant diagnostics from this hospitalization (including imaging, microbiology, ancillary and laboratory) are listed below for reference.    Significant Diagnostic Studies: Dg Chest 2 View  Result Date: 04/27/2017 CLINICAL DATA:  Dyspnea increased over the past 4 days. EXAM: CHEST  2 VIEW COMPARISON:  None. FINDINGS: The heart size and mediastinal contours are within normal limits. Mild increase in interstitial prominence may reflect bronchitic change more so on the right. No  pneumonic consolidations. The visualized skeletal structures are unremarkable. IMPRESSION: Increased interstitial prominence more so the right may reflect bronchitic change. Exam is otherwise stable. Electronically Signed   By: Ashley Royalty M.D.   On: 04/27/2017 22:47     Microbiology: Recent Results (from the past 240 hour(s))  Respiratory Panel by PCR     Status: None   Collection Time: 04/28/17  9:07 AM  Result Value Ref Range Status   Adenovirus NOT DETECTED NOT DETECTED Final   Coronavirus 229E NOT DETECTED NOT DETECTED Final   Coronavirus HKU1 NOT DETECTED NOT DETECTED Final   Coronavirus NL63 NOT DETECTED NOT DETECTED Final   Coronavirus OC43 NOT DETECTED NOT DETECTED Final   Metapneumovirus NOT DETECTED NOT DETECTED Final   Rhinovirus / Enterovirus NOT DETECTED NOT DETECTED Final   Influenza A NOT DETECTED NOT DETECTED Final   Influenza B NOT DETECTED NOT DETECTED Final   Parainfluenza Virus 1 NOT DETECTED NOT DETECTED Final   Parainfluenza Virus 2 NOT DETECTED NOT DETECTED Final   Parainfluenza Virus 3 NOT DETECTED NOT DETECTED Final   Parainfluenza Virus 4 NOT DETECTED NOT DETECTED Final   Respiratory Syncytial Virus NOT DETECTED NOT DETECTED Final   Bordetella pertussis NOT DETECTED NOT DETECTED Final   Chlamydophila pneumoniae NOT DETECTED NOT DETECTED Final   Mycoplasma pneumoniae NOT DETECTED NOT DETECTED Final    Comment: Performed at [facility], [address], [place], [place] [address]     Labs: Basic Metabolic Panel:  Recent Labs Lab 04/27/17 2103 04/28/17 0821 04/29/17 0444  NA 138 135 137  K 3.6 4.7 4.7  CL 99* 96* 100*  CO2 30 30 28   GLUCOSE 276* 272* 247*  BUN 10 12 16   CREATININE 0.86 0.74 0.59*  CALCIUM 9.0 8.6* 9.2  MG 1.9  --   --   PHOS 1.7*  --  3.1   Liver Function Tests:  Recent Labs Lab 04/28/17 0821  AST 20  ALT 34  ALKPHOS 83  BILITOT 0.6  PROT 7.4  ALBUMIN 3.8   No results for input(s): LIPASE, AMYLASE in the last  168 hours. No results for input(s): AMMONIA in the last 168 hours. CBC:  Recent Labs Lab 04/27/17 2103 04/29/17 0444  WBC 15.9* 23.0*  NEUTROABS 12.5* 20.0*  HGB 16.0 16.4  HCT 46.9 49.7  MCV 89.7 89.7  PLT 176 218   Cardiac Enzymes:  Recent Labs Lab 04/27/17 2103 04/28/17 0904 04/28/17 1446  TROPONINI <0.03 <0.03 <0.03   BNP: Invalid input(s): POCBNP CBG:  Recent Labs Lab 04/28/17 1149 04/28/17 1611 04/28/17 2202 04/29/17 0725 04/29/17 1112  GLUCAP 202* 322* 359* 249* 328*    Time coordinating discharge:  Greater than 30 minutes  Signed:  Dany Harten, DO [facility] Pager: [phone] 04/29/2017, 4:14 PM

## 2017-04-29 NOTE — Progress Notes (Signed)
IV removed. Tele removed. Discharge instructions reviewed with patient. Patient verbalized understanding. Awaiting oxygen for discharge home.

## 2017-04-30 LAB — URINE CULTURE: Culture: NO GROWTH

## 2017-04-30 LAB — HIV ANTIBODY (ROUTINE TESTING W REFLEX): HIV Screen 4th Generation wRfx: NONREACTIVE

## 2019-04-20 ENCOUNTER — Inpatient Hospital Stay: Payer: Medicare Other | Attending: Oncology | Admitting: Oncology

## 2019-04-20 ENCOUNTER — Encounter: Payer: Self-pay | Admitting: Oncology

## 2019-04-20 ENCOUNTER — Other Ambulatory Visit: Payer: Self-pay

## 2019-04-20 VITALS — BP 154/83 | HR 111 | Temp 98.5°F | Resp 20 | Ht 68.9 in | Wt 239.6 lb

## 2019-04-20 DIAGNOSIS — R591 Generalized enlarged lymph nodes: Secondary | ICD-10-CM | POA: Diagnosis not present

## 2019-04-20 DIAGNOSIS — Z7951 Long term (current) use of inhaled steroids: Secondary | ICD-10-CM | POA: Diagnosis not present

## 2019-04-20 DIAGNOSIS — Z79899 Other long term (current) drug therapy: Secondary | ICD-10-CM

## 2019-04-20 DIAGNOSIS — R590 Localized enlarged lymph nodes: Secondary | ICD-10-CM

## 2019-04-20 DIAGNOSIS — E119 Type 2 diabetes mellitus without complications: Secondary | ICD-10-CM

## 2019-04-20 DIAGNOSIS — C9110 Chronic lymphocytic leukemia of B-cell type not having achieved remission: Secondary | ICD-10-CM | POA: Insufficient documentation

## 2019-04-20 DIAGNOSIS — J449 Chronic obstructive pulmonary disease, unspecified: Secondary | ICD-10-CM | POA: Insufficient documentation

## 2019-04-20 DIAGNOSIS — Z7984 Long term (current) use of oral hypoglycemic drugs: Secondary | ICD-10-CM | POA: Insufficient documentation

## 2019-04-20 DIAGNOSIS — Z87891 Personal history of nicotine dependence: Secondary | ICD-10-CM | POA: Diagnosis not present

## 2019-04-20 DIAGNOSIS — Z7982 Long term (current) use of aspirin: Secondary | ICD-10-CM

## 2019-04-20 DIAGNOSIS — R0602 Shortness of breath: Secondary | ICD-10-CM

## 2019-04-20 DIAGNOSIS — I10 Essential (primary) hypertension: Secondary | ICD-10-CM

## 2019-04-20 DIAGNOSIS — Z7189 Other specified counseling: Secondary | ICD-10-CM

## 2019-04-20 NOTE — Progress Notes (Signed)
Patient is unaware of why he is being seen today but thinks it is due to swollen lymph nodes in his neck.  He last saw Dr. Inez Pilgrim from the [facility] in 2013 for what he says was "lymphnodes".

## 2019-04-21 DIAGNOSIS — C9110 Chronic lymphocytic leukemia of B-cell type not having achieved remission: Secondary | ICD-10-CM | POA: Insufficient documentation

## 2019-04-21 DIAGNOSIS — Z7189 Other specified counseling: Secondary | ICD-10-CM | POA: Insufficient documentation

## 2019-04-21 DIAGNOSIS — R590 Localized enlarged lymph nodes: Secondary | ICD-10-CM | POA: Insufficient documentation

## 2019-04-21 DIAGNOSIS — R0602 Shortness of breath: Secondary | ICD-10-CM | POA: Insufficient documentation

## 2019-04-21 NOTE — Progress Notes (Addendum)
Hematology/Oncology Consult note Larry Lam [phone] [phone]   Patient Care Team: System, Pcp Not In as PCP - General Leotis Pain, MD as Consulting Physician (Neurology)  REFERRING PROVIDER: Erby Pian, MD  CHIEF COMPLAINTS/REASON FOR VISIT:  Evaluation of history of leukemia  HISTORY OF PRESENTING ILLNESS:   Larry Lam is a  48 y.o.  male with PMH listed below was seen in consultation at the request of  Erby Pian, MD  for evaluation of history of leukemia.   Patient follows up with Dr. Raul Del for COPD.Marland Kitchen  He noticed enlarged neck lymph nodes for a few weeks.  He recalled that he used to be seen by Dr. Cynda Acres for enlarged lymph node and possible leukemia in the past.  He was not sure what was exactly the diagnosis at that time. Patient was referred back to cancer center for further evaluation .  Patient denies any fever, chills, unintentional weight loss, night sweating.  Medical records from 2010 was obtained and extensive medical records review was performed. Patient was initially seen by Dr. Cynda Acres on 02/17/2009 for consultation of abnormal CT chest.  02/16/2009 CT chest with contrast showed mediastinal and hilar lymphadenopathy along the pulmonary nodular opacitie, and a mild infiltrate of the left lower lobe.  Lymphadenopathy was considered to be reactive versus possible lymphoma.  Resolved after steroid use. 05/04/2009 CT chest with contrast showed interim clearing of mediastinal and hilar adenopathy, no evidence of infiltrate or other focal abnormality. Follow-up CT chest January 2011 per Dr.Gitten's note showed prominent but not pathologically enlarged axillary lymph node, prominent asymmetric but not pathologically enlarged inguinal lymph nodes.  Fatty infiltration diffusely in the liver. There was no biopsy pathology available.  Dr. Raul Del obtained peripheral blood flow cytometry with results  consistent with diagnosis of CLL, CD5 plus, CD23 plus CD38 negative. 04/06/2019 ferritin 120, magnesium 1.6, potassium 4.9.  ACE 7, ESR 52 TSH 4.556  Review past labs. 04/29/2017, WBC 23, hemoglobin 16.4, platelet count 218, neutrophil 20, lymphocyte 2.3  Review of Systems  Constitutional: Positive for fatigue. Negative for appetite change, chills, fever and unexpected weight change.  HENT:   Positive for lump/mass. Negative for hearing loss and voice change.        Neck lymph node enlarged  Eyes: Negative for eye problems and icterus.  Respiratory: Negative for chest tightness, cough and shortness of breath.   Cardiovascular: Negative for chest pain and leg swelling.  Gastrointestinal: Negative for abdominal distention and abdominal pain.  Endocrine: Negative for hot flashes.  Genitourinary: Negative for difficulty urinating, dysuria and frequency.   Musculoskeletal: Negative for arthralgias.  Skin: Negative for itching and rash.  Neurological: Negative for light-headedness and numbness.  Hematological: Negative for adenopathy. Does not bruise/bleed easily.  Psychiatric/Behavioral: Negative for confusion.    MEDICAL HISTORY:  Past Medical History:  Diagnosis Date  . COPD (chronic obstructive pulmonary disease) ([place])   . Diabetes mellitus without complication ([place])   . Hypertension   . Seizures ([place])    3-4 years ago. Single episode.  . Tobacco use     SURGICAL HISTORY: History reviewed. No pertinent surgical history.  SOCIAL HISTORY: Social History   Socioeconomic History  . Marital status: Married    Spouse name: Not on file  . Number of children: Not on file  . Years of education: Not on file  . Highest education level: Not on file  Occupational History  . Not on file  Social Needs  .  Financial resource strain: Not on file  . Food insecurity    Worry: Not on file    Inability: Not on file  . Transportation needs    Medical: Not on file    Non-medical: Not on  file  Tobacco Use  . Smoking status: Former Smoker    Packs/day: 1.00    Years: 33.00    Pack years: 33.00    Types: Cigarettes    Quit date: 03/23/2019    Years since quitting: 0.0  . Smokeless tobacco: Former Network engineer and Sexual Activity  . Alcohol use: Yes    Alcohol/week: 0.0 standard drinks    Comment: rare  . Drug use: No    Comment: smokes pot  . Sexual activity: Not on file  Lifestyle  . Physical activity    Days per week: Not on file    Minutes per session: Not on file  . Stress: Not on file  Relationships  . Social Herbalist on phone: Not on file    Gets together: Not on file    Attends religious service: Not on file    Active member of club or organization: Not on file    Attends meetings of clubs or organizations: Not on file    Relationship status: Not on file  . Intimate partner violence    Fear of current or ex partner: Not on file    Emotionally abused: Not on file    Physically abused: Not on file    Forced sexual activity: Not on file  Other Topics Concern  . Not on file  Social History Narrative  . Not on file    FAMILY HISTORY: Family History  Problem Relation Age of Onset  . Lung cancer Maternal Grandfather     ALLERGIES:  is allergic to amlodipine besylate-valsartan and hydrochlorothiazide.  MEDICATIONS:  Current Outpatient Medications  Medication Sig Dispense Refill  . albuterol (PROVENTIL HFA;VENTOLIN HFA) 108 (90 Base) MCG/ACT inhaler Inhale 2 puffs into the lungs every 6 (six) hours as needed for wheezing or shortness of breath. 1 Inhaler 0  . albuterol (VENTOLIN HFA) 108 (90 Base) MCG/ACT inhaler Inhale into the lungs.    . ASPIRIN 81 PO Take 81 mg by mouth.    Marland Kitchen atorvastatin (LIPITOR) 10 MG tablet Take by mouth.    . Fluticasone-Salmeterol (ADVAIR) 250-50 MCG/DOSE AEPB Inhale into the lungs.    Marland Kitchen ipratropium-albuterol (DUONEB) 0.5-2.5 (3) MG/3ML SOLN Take 3 mLs by nebulization every 6 (six) hours as needed. J44.9  and J44.1 360 mL 0  . lisinopril (PRINIVIL,ZESTRIL) 20 MG tablet Take 1 tablet (20 mg total) by mouth daily. 30 tablet 0  . metFORMIN (GLUCOPHAGE) 500 MG tablet Take 1 tablet (500 mg total) by mouth 2 (two) times daily with a meal. (Patient taking differently: Take 1,000 mg by mouth 2 (two) times daily with a meal. ) 60 tablet 0  . umeclidinium bromide (INCRUSE ELLIPTA) 62.5 MCG/INH AEPB Inhale into the lungs.    Marland Kitchen amLODipine (NORVASC) 10 MG tablet Take 1 tablet (10 mg total) by mouth daily. (Patient not taking: Reported on 04/20/2019) 30 tablet 0  . glipiZIDE (GLUCOTROL) 5 MG tablet Take 1 tablet (5 mg total) by mouth 2 (two) times daily. 60 tablet 0  . metoprolol tartrate (LOPRESSOR) 25 MG tablet Take 1 tablet (25 mg total) by mouth 2 (two) times daily. (Patient not taking: Reported on 04/20/2019) 60 tablet 0  . predniSONE (DELTASONE) 10 MG tablet Take 6  tablets (60 mg total) by mouth daily with breakfast. And decrease by one tablet daily (Patient not taking: Reported on 04/20/2019) 21 tablet 0   No current facility-administered medications for this visit.      PHYSICAL EXAMINATION: ECOG PERFORMANCE STATUS: 0 - Asymptomatic Vitals:   04/20/19 1511  BP: (!) 154/83  Pulse: (!) 111  Resp: 20  Temp: 98.5 F (36.9 C)   Filed Weights   04/20/19 1511  Weight: 239 lb 9.6 oz (108.7 kg)    Physical Exam Constitutional:      General: He is not in acute distress. HENT:     Head: Normocephalic and atraumatic.  Eyes:     General: No scleral icterus.    Pupils: Pupils are equal, round, and reactive to light.  Neck:     Musculoskeletal: Normal range of motion and neck supple.     Comments: Bilateral cervical lymphadenopathy Cardiovascular:     Rate and Rhythm: Regular rhythm. Tachycardia present.     Heart sounds: Normal heart sounds.  Pulmonary:     Effort: Pulmonary effort is normal. No respiratory distress.     Breath sounds: No wheezing.     Comments: Bilateral axillary  lymphadenopathy Abdominal:     General: Bowel sounds are normal. There is no distension.     Palpations: Abdomen is soft. There is no mass.     Tenderness: There is no abdominal tenderness.  Musculoskeletal: Normal range of motion.        General: No deformity.  Skin:    General: Skin is warm and dry.     Findings: No erythema or rash.  Neurological:     Mental Status: He is alert and oriented to person, place, and time.     Cranial Nerves: No cranial nerve deficit.     Coordination: Coordination normal.  Psychiatric:        Behavior: Behavior normal.        Thought Content: Thought content normal.     LABORATORY DATA:  I have reviewed the data as listed Lab Results  Component Value Date   WBC 23.0 (H) 04/29/2017   HGB 16.4 04/29/2017   HCT 49.7 04/29/2017   MCV 89.7 04/29/2017   PLT 218 04/29/2017   No results for input(s): NA, K, CL, CO2, GLUCOSE, BUN, CREATININE, CALCIUM, GFRNONAA, GFRAA, PROT, ALBUMIN, AST, ALT, ALKPHOS, BILITOT, BILIDIR, IBILI in the last 8760 hours. Iron/TIBC/Ferritin/ %Sat No results found for: IRON, TIBC, FERRITIN, IRONPCTSAT    RADIOGRAPHIC STUDIES: I have personally reviewed the radiological images as listed and agreed with the findings in the report. No results found.    ASSESSMENT & PLAN:  1. CLL (chronic lymphocytic leukemia) (HCC)   2. Cervical lymphadenopathy   3. Shortness of breath   4. Chronic obstructive pulmonary disease, unspecified COPD type ([place])   5. Goals of care, counseling/discussion   # CLL Labs and medical records were reviewed and discussed with patient Peripheral blood flowcytometry is consistent with CLL.   # Cervical lymphadenopathy, axillary lymphadenopathy, Recommend obtaining CT neck, chest and abdomen/pelvis for further evaluation.   Check cbc smear, cmp, LDH, uric acid, beta2 microglobulin, IGVH hypermutation, CLL FISH panel, hepatitis B surface antigen and hepatitis C antibody.   # COPD/Shortness of breath,  continue follow up with Dr.Fleming.  Adviar, spiriva PRN albuterol.  # Tachycardia, Dr.Fleming has recommended patient to discontinue caffeine intake, plan Holter.   Orders Placed This Encounter  Procedures  . CT SOFT TISSUE NECK W CONTRAST  Standing Status:   Future    Standing Expiration Date:   07/21/2020    Order Specific Question:   If indicated for the ordered procedure, I authorize the administration of contrast media per Radiology protocol    Answer:   Yes    Order Specific Question:   Preferred imaging location?    Answer:   [facility]    Order Specific Question:   Radiology Contrast Protocol - do NOT remove file path    Answer:   \\[email or web address]  . CT Chest W Contrast    Standing Status:   Future    Standing Expiration Date:   04/20/2020    Order Specific Question:   If indicated for the ordered procedure, I authorize the administration of contrast media per Radiology protocol    Answer:   Yes    Order Specific Question:   Preferred imaging location?    Answer:   [facility]    Order Specific Question:   Radiology Contrast Protocol - do NOT remove file path    Answer:   \\[email or web address]  . CT Abdomen Pelvis W Contrast    Standing Status:   Future    Standing Expiration Date:   04/20/2020    Order Specific Question:   If indicated for the ordered procedure, I authorize the administration of contrast media per Radiology protocol    Answer:   Yes    Order Specific Question:   Preferred imaging location?    Answer:   [facility]    Order Specific Question:   Is Oral Contrast requested for this exam?    Answer:   Yes, Per Radiology protocol    Order Specific Question:   Radiology Contrast Protocol - do NOT remove file path    Answer:   \\[email or web address]  . CBC with Differential/Platelet    Standing Status:   Future    Standing Expiration Date:   04/20/2020  . Comprehensive  metabolic panel    Standing Status:   Future    Standing Expiration Date:   04/20/2020  . Lactate dehydrogenase    Standing Status:   Future    Standing Expiration Date:   04/20/2020  . Uric acid    Standing Status:   Future    Standing Expiration Date:   04/20/2020  . Beta 2 microglobulin, serum    Standing Status:   Future    Standing Expiration Date:   04/20/2020  . FISH HES DGLOVFIE,3P29 REA    Standing Status:   Future    Standing Expiration Date:   04/20/2020  . Miscellaneous [facility] test (send-out)    Standing Status:   Future    Standing Expiration Date:   04/20/2020    Order Specific Question:   Test name / description:    Answer:   TEST: [id number] IGVH hypermutation.  . Hepatitis B surface antigen    Standing Status:   Future    Standing Expiration Date:   04/20/2020  . Hepatitis C antibody    Standing Status:   Future    Standing Expiration Date:   04/20/2020  . Technologist smear review    Standing Status:   Future    Standing Expiration Date:   04/20/2020    All questions were answered. The patient knows to call the clinic with any problems questions or concerns.  cc Erby Pian, MD    Return of visit: after CT scan,  Thank you for this kind referral and the opportunity to participate  in the care of this patient. A copy of today's note is routed to referring provider  Total face to face encounter time for this patient visit was 60 min. >50% of the time was  spent in counseling and coordination of care.    Earlie Server, MD, PhD Hematology Oncology [facility] at [facility] Pager- [phone] 04/21/2019

## 2019-04-23 ENCOUNTER — Other Ambulatory Visit: Payer: Self-pay

## 2019-04-23 ENCOUNTER — Inpatient Hospital Stay: Payer: Medicare Other

## 2019-04-23 DIAGNOSIS — R590 Localized enlarged lymph nodes: Secondary | ICD-10-CM

## 2019-04-23 DIAGNOSIS — C9110 Chronic lymphocytic leukemia of B-cell type not having achieved remission: Secondary | ICD-10-CM

## 2019-04-23 DIAGNOSIS — R0602 Shortness of breath: Secondary | ICD-10-CM

## 2019-04-23 LAB — CBC WITH DIFFERENTIAL/PLATELET
Abs Immature Granulocytes: 0.24 10*3/uL — ABNORMAL HIGH (ref 0.00–0.07)
Basophils Absolute: 0.1 10*3/uL (ref 0.0–0.1)
Basophils Relative: 0 %
Eosinophils Absolute: 0.1 10*3/uL (ref 0.0–0.5)
Eosinophils Relative: 0 %
HCT: 37.5 % — ABNORMAL LOW (ref 39.0–52.0)
Hemoglobin: 12.4 g/dL — ABNORMAL LOW (ref 13.0–17.0)
Immature Granulocytes: 1 %
Lymphocytes Relative: 80 %
Lymphs Abs: 20.1 10*3/uL — ABNORMAL HIGH (ref 0.7–4.0)
MCH: 30 pg (ref 26.0–34.0)
MCHC: 33.1 g/dL (ref 30.0–36.0)
MCV: 90.6 fL (ref 80.0–100.0)
Monocytes Absolute: 0.3 10*3/uL (ref 0.1–1.0)
Monocytes Relative: 1 %
Neutro Abs: 4.5 10*3/uL (ref 1.7–7.7)
Neutrophils Relative %: 18 %
Platelets: 123 10*3/uL — ABNORMAL LOW (ref 150–400)
RBC: 4.14 MIL/uL — ABNORMAL LOW (ref 4.22–5.81)
RDW: 15.6 % — ABNORMAL HIGH (ref 11.5–15.5)
WBC Morphology: ABNORMAL
WBC: 25.3 10*3/uL — ABNORMAL HIGH (ref 4.0–10.5)
nRBC: 0.3 % — ABNORMAL HIGH (ref 0.0–0.2)

## 2019-04-23 LAB — TECHNOLOGIST SMEAR REVIEW

## 2019-04-23 LAB — COMPREHENSIVE METABOLIC PANEL
ALT: 14 U/L (ref 0–44)
AST: 14 U/L — ABNORMAL LOW (ref 15–41)
Albumin: 3.8 g/dL (ref 3.5–5.0)
Alkaline Phosphatase: 89 U/L (ref 38–126)
Anion gap: 10 (ref 5–15)
BUN: 21 mg/dL — ABNORMAL HIGH (ref 6–20)
CO2: 28 mmol/L (ref 22–32)
Calcium: 9.4 mg/dL (ref 8.9–10.3)
Chloride: 96 mmol/L — ABNORMAL LOW (ref 98–111)
Creatinine, Ser: 1.13 mg/dL (ref 0.61–1.24)
GFR calc Af Amer: >60 mL/min (ref >60)
GFR calc non Af Amer: >60 mL/min (ref >60)
Glucose, Bld: 385 mg/dL — ABNORMAL HIGH (ref 70–99)
Potassium: 4.8 mmol/L (ref 3.5–5.1)
Sodium: 134 mmol/L — ABNORMAL LOW (ref 135–145)
Total Bilirubin: 0.4 mg/dL (ref 0.3–1.2)
Total Protein: 7.4 g/dL (ref 6.5–8.1)

## 2019-04-23 LAB — LACTATE DEHYDROGENASE: LDH: 119 U/L (ref 98–192)

## 2019-04-23 LAB — URIC ACID: Uric Acid, Serum: 5.6 mg/dL (ref 3.7–8.6)

## 2019-04-24 LAB — BETA 2 MICROGLOBULIN, SERUM: Beta-2 Microglobulin: 3.3 mg/L — ABNORMAL HIGH (ref 0.6–2.4)

## 2019-04-24 LAB — HEPATITIS B SURFACE ANTIGEN: Hepatitis B Surface Ag: NEGATIVE

## 2019-04-24 LAB — HEPATITIS C ANTIBODY: HCV Ab: 0.2 s/co ratio (ref 0.0–0.9)

## 2019-04-30 ENCOUNTER — Ambulatory Visit
Admission: RE | Admit: 2019-04-30 | Discharge: 2019-04-30 | Disposition: A | Discharge status: Home / Self Care | Payer: Medicare Other | Source: Ambulatory Visit | Attending: Oncology | Admitting: Oncology

## 2019-04-30 ENCOUNTER — Other Ambulatory Visit: Payer: Medicare Other

## 2019-04-30 ENCOUNTER — Other Ambulatory Visit: Payer: Self-pay

## 2019-04-30 DIAGNOSIS — R0602 Shortness of breath: Secondary | ICD-10-CM | POA: Diagnosis present

## 2019-04-30 DIAGNOSIS — R590 Localized enlarged lymph nodes: Secondary | ICD-10-CM | POA: Diagnosis present

## 2019-04-30 DIAGNOSIS — C9110 Chronic lymphocytic leukemia of B-cell type not having achieved remission: Secondary | ICD-10-CM | POA: Insufficient documentation

## 2019-04-30 HISTORY — DX: Unspecified asthma, uncomplicated: J45.909

## 2019-04-30 MED ORDER — IOHEXOL 300 MG/ML  SOLN
100.0000 mL | Freq: Once | INTRAMUSCULAR | Status: AC | PRN
  Administered 2019-04-30: 14:00:00 100 mL via INTRAVENOUS

## 2019-05-04 ENCOUNTER — Other Ambulatory Visit: Payer: Self-pay

## 2019-05-04 LAB — FISH HES LEUKEMIA, 4Q12 REA

## 2019-05-04 LAB — MISC LABCORP TEST (SEND OUT): Labcorp test code: 113753

## 2019-05-05 ENCOUNTER — Inpatient Hospital Stay: Payer: Medicare Other | Attending: Oncology | Admitting: Oncology

## 2019-05-05 ENCOUNTER — Other Ambulatory Visit: Payer: Self-pay

## 2019-05-05 ENCOUNTER — Encounter: Payer: Self-pay | Admitting: Oncology

## 2019-05-05 VITALS — BP 190/99 | HR 108 | Temp 97.1°F | Wt 245.0 lb

## 2019-05-05 DIAGNOSIS — Z7982 Long term (current) use of aspirin: Secondary | ICD-10-CM | POA: Insufficient documentation

## 2019-05-05 DIAGNOSIS — R161 Splenomegaly, not elsewhere classified: Secondary | ICD-10-CM

## 2019-05-05 DIAGNOSIS — I10 Essential (primary) hypertension: Secondary | ICD-10-CM | POA: Insufficient documentation

## 2019-05-05 DIAGNOSIS — J449 Chronic obstructive pulmonary disease, unspecified: Secondary | ICD-10-CM | POA: Diagnosis not present

## 2019-05-05 DIAGNOSIS — Z7189 Other specified counseling: Secondary | ICD-10-CM

## 2019-05-05 DIAGNOSIS — E119 Type 2 diabetes mellitus without complications: Secondary | ICD-10-CM | POA: Diagnosis not present

## 2019-05-05 DIAGNOSIS — R918 Other nonspecific abnormal finding of lung field: Secondary | ICD-10-CM

## 2019-05-05 DIAGNOSIS — D649 Anemia, unspecified: Secondary | ICD-10-CM | POA: Insufficient documentation

## 2019-05-05 DIAGNOSIS — Z7951 Long term (current) use of inhaled steroids: Secondary | ICD-10-CM | POA: Diagnosis not present

## 2019-05-05 DIAGNOSIS — Z87891 Personal history of nicotine dependence: Secondary | ICD-10-CM | POA: Insufficient documentation

## 2019-05-05 DIAGNOSIS — C9110 Chronic lymphocytic leukemia of B-cell type not having achieved remission: Secondary | ICD-10-CM | POA: Diagnosis not present

## 2019-05-05 DIAGNOSIS — R591 Generalized enlarged lymph nodes: Secondary | ICD-10-CM

## 2019-05-05 DIAGNOSIS — Z79899 Other long term (current) drug therapy: Secondary | ICD-10-CM | POA: Diagnosis not present

## 2019-05-05 DIAGNOSIS — Z7984 Long term (current) use of oral hypoglycemic drugs: Secondary | ICD-10-CM | POA: Insufficient documentation

## 2019-05-06 DIAGNOSIS — R591 Generalized enlarged lymph nodes: Secondary | ICD-10-CM | POA: Insufficient documentation

## 2019-05-06 DIAGNOSIS — R918 Other nonspecific abnormal finding of lung field: Secondary | ICD-10-CM | POA: Insufficient documentation

## 2019-05-06 DIAGNOSIS — R161 Splenomegaly, not elsewhere classified: Secondary | ICD-10-CM | POA: Insufficient documentation

## 2019-05-06 NOTE — Progress Notes (Signed)
Hematology/Oncology Consult note [facility] Telephone:([phone] [phone]   Patient Care Team: [name], MD as PCP - General (Family Medicine) Leotis Pain, MD as Consulting Physician (Neurology)  REFERRING PROVIDER: No ref. provider found  CHIEF COMPLAINTS/REASON FOR VISIT:  Evaluation of history of leukemia  HISTORY OF PRESENTING ILLNESS:   Larry Lam is a  48 y.o.  male with PMH listed below was seen in consultation at the request of  No ref. provider found  for evaluation of history of leukemia.   Patient follows up with Dr. Raul Del for COPD.Marland Kitchen  He noticed enlarged neck lymph nodes for a few weeks.  He recalled that he used to be seen by Dr. Cynda Acres for enlarged lymph node and possible leukemia in the past.  He was not sure what was exactly the diagnosis at that time. Patient was referred back to cancer center for further evaluation .  Patient denies any fever, chills, unintentional weight loss, night sweating.  Medical records from 2010 was obtained and extensive medical records review was performed. Patient was initially seen by Dr. Cynda Acres on 02/17/2009 for consultation of abnormal CT chest.  02/16/2009 CT chest with contrast showed mediastinal and hilar lymphadenopathy along the pulmonary nodular opacitie, and a mild infiltrate of the left lower lobe.  Lymphadenopathy was considered to be reactive versus possible lymphoma.  Resolved after steroid use. 05/04/2009 CT chest with contrast showed interim clearing of mediastinal and hilar adenopathy, no evidence of infiltrate or other focal abnormality. Follow-up CT chest January 2011 per Dr.Gitten's note showed prominent but not pathologically enlarged axillary lymph node, prominent asymmetric but not pathologically enlarged inguinal lymph nodes.  Fatty infiltration diffusely in the liver. There was no biopsy pathology available.  Dr. 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 04/06/2019 ferritin 120, magnesium 1.6, potassium 4.9.  ACE 7, ESR 52 TSH 4.556  Review past labs. 04/29/2017, WBC 23, hemoglobin 16.4, platelet count 218, neutrophil 20, lymphocyte 2.3   INTERVAL HISTORY Larry Lam is a 48 y.o. male who has above history reviewed by me today presents for follow up visit for management of lymphocytosis.  Problems and complaints are listed below: He has no new complaints. Denies weight loss, fever, chills, fatigue, night sweats.  Denies any dysphagia, shortness of breath.  Chronic fatigue.  Good appetite and has gained weight.  Blood pressure is high. He did not take BP medication today. No headache or focal deficit.   Review of Systems  Constitutional: Positive for fatigue. Negative for appetite change, chills, fever and unexpected weight change.  HENT:   Positive for lump/mass. Negative for hearing loss and voice change.        Neck lymph node enlarged  Eyes: Negative for eye problems and icterus.  Respiratory: Negative for chest tightness, cough and shortness of breath.   Cardiovascular: Negative for chest pain and leg swelling.  Gastrointestinal: Negative for abdominal distention and abdominal pain.  Endocrine: Negative for hot flashes.  Genitourinary: Negative for difficulty urinating, dysuria and frequency.   Musculoskeletal: Negative for arthralgias.  Skin: Negative for itching and rash.  Neurological: Negative for light-headedness and numbness.  Hematological: Negative for adenopathy. Does not bruise/bleed easily.  Psychiatric/Behavioral: Negative for confusion.    MEDICAL HISTORY:  Past Medical History:  Diagnosis Date  . Asthma   . COPD (chronic obstructive pulmonary disease) ([place])   . Diabetes mellitus without complication ([place])   .  Hypertension   . Seizures ([place])    3-4 years ago. Single episode.  . Tobacco use     SURGICAL HISTORY:  History reviewed. No pertinent surgical history. Denies any surgery history.   SOCIAL HISTORY: Social History   Socioeconomic History  . Marital status: Married    Spouse name: Not on file  . Number of children: Not on file  . Years of education: Not on file  . Highest education level: Not on file  Occupational History  . Not on file  Social Needs  . Financial resource strain: Not on file  . Food insecurity    Worry: Not on file    Inability: Not on file  . Transportation needs    Medical: Not on file    Non-medical: Not on file  Tobacco Use  . Smoking status: Former Smoker    Packs/day: 1.00    Years: 33.00    Pack years: 33.00    Types: Cigarettes    Quit date: 03/23/2019    Years since quitting: 0.1  . Smokeless tobacco: Former Network engineer and Sexual Activity  . Alcohol use: Yes    Alcohol/week: 0.0 standard drinks    Comment: rare  . Drug use: No    Comment: smokes pot  . Sexual activity: Not on file  Lifestyle  . Physical activity    Days per week: Not on file    Minutes per session: Not on file  . Stress: Not on file  Relationships  . Social Herbalist on phone: Not on file    Gets together: Not on file    Attends religious service: Not on file    Active member of club or organization: Not on file    Attends meetings of clubs or organizations: Not on file    Relationship status: Not on file  . Intimate partner violence    Fear of current or ex partner: Not on file    Emotionally abused: Not on file    Physically abused: Not on file    Forced sexual activity: Not on file  Other Topics Concern  . Not on file  Social History Narrative  . Not on file    FAMILY HISTORY: Family History  Problem Relation Age of Onset  . Lung cancer Maternal Grandfather     ALLERGIES:  is allergic to amlodipine besylate-valsartan and hydrochlorothiazide.  MEDICATIONS:  Current Outpatient Medications  Medication Sig Dispense Refill  . albuterol  (PROVENTIL HFA;VENTOLIN HFA) 108 (90 Base) MCG/ACT inhaler Inhale 2 puffs into the lungs every 6 (six) hours as needed for wheezing or shortness of breath. 1 Inhaler 0  . albuterol (VENTOLIN HFA) 108 (90 Base) MCG/ACT inhaler Inhale into the lungs.    Marland Kitchen amLODipine (NORVASC) 10 MG tablet Take 1 tablet (10 mg total) by mouth daily. 30 tablet 0  . ASPIRIN 81 PO Take 81 mg by mouth.    Marland Kitchen atorvastatin (LIPITOR) 10 MG tablet Take by mouth.    . Fluticasone-Salmeterol (ADVAIR) 250-50 MCG/DOSE AEPB Inhale into the lungs.    Marland Kitchen glipiZIDE (GLUCOTROL) 5 MG tablet Take 1 tablet (5 mg total) by mouth 2 (two) times daily. 60 tablet 0  . ipratropium-albuterol (DUONEB) 0.5-2.5 (3) MG/3ML SOLN Take 3 mLs by nebulization every 6 (six) hours as needed. J44.9 and J44.1 360 mL 0  . lisinopril (PRINIVIL,ZESTRIL) 20 MG tablet Take 1 tablet (20 mg total) by mouth daily. 30 tablet 0  . metFORMIN (GLUCOPHAGE) 500 MG  tablet Take 1 tablet (500 mg total) by mouth 2 (two) times daily with a meal. (Patient taking differently: Take 1,000 mg by mouth 2 (two) times daily with a meal. ) 60 tablet 0  . metoprolol tartrate (LOPRESSOR) 25 MG tablet Take 1 tablet (25 mg total) by mouth 2 (two) times daily. 60 tablet 0  . umeclidinium bromide (INCRUSE ELLIPTA) 62.5 MCG/INH AEPB Inhale into the lungs.    . predniSONE (DELTASONE) 10 MG tablet Take 6 tablets (60 mg total) by mouth daily with breakfast. And decrease by one tablet daily (Patient not taking: Reported on 04/20/2019) 21 tablet 0   No current facility-administered medications for this visit.      PHYSICAL EXAMINATION: ECOG PERFORMANCE STATUS: 0 - Asymptomatic Vitals:   05/05/19 1456  BP: (!) 190/99  Pulse: (!) 108  Temp: (!) 97.1 F (36.2 C)   Filed Weights   05/05/19 1456  Weight: 245 lb (111.1 kg)    Physical Exam Constitutional:      General: He is not in acute distress. HENT:     Head: Normocephalic and atraumatic.  Eyes:     General: No scleral icterus.     Pupils: Pupils are equal, round, and reactive to light.  Neck:     Musculoskeletal: Normal range of motion and neck supple.     Comments: Bilateral cervical lymphadenopathy Cardiovascular:     Rate and Rhythm: Regular rhythm. Tachycardia present.     Heart sounds: Normal heart sounds.  Pulmonary:     Effort: Pulmonary effort is normal. No respiratory distress.     Breath sounds: No wheezing.     Comments: Bilateral axillary lymphadenopathy Abdominal:     General: Bowel sounds are normal. There is no distension.     Palpations: Abdomen is soft. There is no mass.     Tenderness: There is no abdominal tenderness.  Musculoskeletal: Normal range of motion.        General: No deformity.  Skin:    General: Skin is warm and dry.     Findings: No erythema or rash.  Neurological:     Mental Status: He is alert and oriented to person, place, and time.     Cranial Nerves: No cranial nerve deficit.     Coordination: Coordination normal.  Psychiatric:        Behavior: Behavior normal.        Thought Content: Thought content normal.     LABORATORY DATA:  I have reviewed the data as listed Lab Results  Component Value Date   WBC 25.3 (H) 04/23/2019   HGB 12.4 (L) 04/23/2019   HCT 37.5 (L) 04/23/2019   MCV 90.6 04/23/2019   PLT 123 (L) 04/23/2019   Recent Labs    04/23/19 1358  NA 134*  K 4.8  CL 96*  CO2 28  GLUCOSE 385*  BUN 21*  CREATININE 1.13  CALCIUM 9.4  GFRNONAA >60  GFRAA >60  PROT 7.4  ALBUMIN 3.8  AST 14*  ALT 14  ALKPHOS 89  BILITOT 0.4   Iron/TIBC/Ferritin/ %Sat No results found for: IRON, TIBC, FERRITIN, IRONPCTSAT    RADIOGRAPHIC STUDIES: I have personally reviewed the radiological images as listed and agreed with the findings in the report. Ct Soft Tissue Neck W Contrast  Result Date: 05/01/2019 CLINICAL DATA:  CLL follow-up EXAM: CT NECK WITH CONTRAST TECHNIQUE: Multidetector CT imaging of the neck was performed using the standard protocol following  the bolus administration of intravenous contrast. CONTRAST:  181m  OMNIPAQUE IOHEXOL 300 MG/ML  SOLN COMPARISON:  PET-CT 02/17/2009 FINDINGS: Pharynx and larynx: Symmetric thickening of Waldeyer's ring. Salivary glands: No inflammation, mass, or stone. Thyroid: Normal. Lymph nodes: Bulky homogeneous lymphadenopathy involving essentially every cervical lymph node. For follow-up purposes right submandibular (40 x 18 mm) and left lower IJ (28 x 32 mm on 505:62) are amenable to reproducible measurement. Vascular: Diffuse atheromatous wall thickening of vessels that is unexpectedly advanced for age. Limited intracranial: Negative Visualized orbits: Negative Mastoids and visualized paranasal sinuses: Clear Skeleton: No acute or aggressive finding Upper chest: There is airway thickening, adenopathy, and a right upper lobe pulmonary nodule measuring 5 mm. There is pending dedicated chest CT. IMPRESSION: Bulky widespread adenopathy consistent with history of CLL. No recent neck imaging for comparison purposes. Age advanced atherosclerosis. Electronically Signed   By: Monte Fantasia M.D.   On: 05/01/2019 11:41   Ct Chest W Contrast  Result Date: 04/30/2019 CLINICAL DATA:  Chronic lymphocytic leukemia.  Initial workup. EXAM: CT CHEST, ABDOMEN, AND PELVIS WITH CONTRAST TECHNIQUE: Multidetector CT imaging of the chest, abdomen and pelvis was performed following the standard protocol during bolus administration of intravenous contrast. CONTRAST:  157m OMNIPAQUE IOHEXOL 300 MG/ML  SOLN COMPARISON:  09/20/2009 FINDINGS: CT CHEST FINDINGS Cardiovascular: Normal heart size. No pericardial effusion. Mild aortic atherosclerosis identified. Mediastinum/Nodes: Normal appearance of the thyroid gland. The trachea appears patent and is midline. Normal appearance of the esophagus. Extensive thoracic adenopathy identified. -index right axillary node measures 3.6 cm, image 18/4. -index left axillary node measures 2.8 cm, image 16/4.  -Index left supraclavicular lymph node measures 2 cm, image 2/4. -enlarged left pre-vascular node measures 1.8 cm, image 19/4. -right paratracheal lymph node measures 2.4 cm, image 17/4. Subcarinal lymph node measures 2.6 cm, image 29/4. -right hilar node measures 2.5 cm, image 25/4. Lungs/Pleura: No pleural effusion. No airspace consolidation identified. Diffuse bilateral lower lung zone predominant centrilobular ground-glass nodules are identified, nonspecific. -Small solid nodule within the posteromedial right upper lobe measures 4 mm, image 51/6. -Subpleural nodule within the posterolateral right upper lobe measures 5 mm, image 23/6. Musculoskeletal: No chest wall mass or suspicious bone lesions identified. CT ABDOMEN PELVIS FINDINGS Hepatobiliary: No focal liver abnormality is seen. No gallstones, gallbladder wall thickening, or biliary dilatation. Pancreas: Unremarkable. No pancreatic ductal dilatation or surrounding inflammatory changes. Spleen: The spleen measures 11.8 x 7.8 by 12.9 cm (volume = 620 cm^3). Adrenals/Urinary Tract: Normal appearance of the adrenal glands. No focal kidney abnormality. No hydronephrosis. No focal bladder abnormality. Bulky bilateral pelvic sidewall lymph nodes have mass effect upon the bladder. Stomach/Bowel: Stomach is normal. The small bowel loops have a normal course and caliber. No pathologic dilatation of the colon. Vascular/Lymphatic: Aortic atherosclerosis. Extensive abdominopelvic adenopathy identified. -porta hepatic node measures 3.1 cm, image 57/4. -gastrohepatic ligament node measures 2.1 cm, image 54/4. -Left retroperitoneal lymph node measures 2 cm, image 75/4. -Portacaval lymph node measures 3.2 cm, image 65/4. -Left common iliac lymph node measures 2.1 cm, image 79/4. -Large right external iliac lymph node measures 3.8 cm, image 106/4. -Left external iliac lymph node measures 2.7 cm, image 106/4. -right inguinal lymph node measures 3.5 cm, image 118/4. -left  inguinal lymph node measures 3.1 cm, image 116/4. Reproductive: Prostate is unremarkable. Other: No abdominal wall hernia or abnormality. No abdominopelvic ascites. Musculoskeletal: Sclerosis within the right femoral head is noted, nonspecific but may reflect early AVN. No acute osseous abnormalities identified. IMPRESSION: 1. Examination is remarkable for extensive thoracic, abdominal, pelvic and inguinal adenopathy compatible  with the history of chronic lymphocytic leukemia. 2. Splenomegaly. 3. 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 4. There are 2 small less than 5 mm subpleural pulmonary nodules identified. No follow-up needed if patient is low-risk (and has no known or suspected primary neoplasm). Non-contrast chest CT can be considered in 12 months if patient is high-risk. This recommendation follows the consensus statement: Guidelines for Management of Incidental Pulmonary Nodules Detected on CT Images: From the Fleischner Society 2017; Radiology 2017; 284:228-243. Electronically Signed   By: Kerby Moors M.D.   On: 04/30/2019 16:45   Ct Abdomen Pelvis W Contrast  Result Date: 04/30/2019 CLINICAL DATA:  Chronic lymphocytic leukemia.  Initial workup. EXAM: CT CHEST, ABDOMEN, AND PELVIS WITH CONTRAST TECHNIQUE: Multidetector CT imaging of the chest, abdomen and pelvis was performed following the standard protocol during bolus administration of intravenous contrast. CONTRAST:  162m OMNIPAQUE IOHEXOL 300 MG/ML  SOLN COMPARISON:  09/20/2009 FINDINGS: CT CHEST FINDINGS Cardiovascular: Normal heart size. No pericardial effusion. Mild aortic atherosclerosis identified. Mediastinum/Nodes: Normal appearance of the thyroid gland. The trachea appears patent and is midline. Normal appearance of the esophagus. Extensive thoracic adenopathy identified. -index right axillary node measures 3.6 cm, image 18/4. -index left axillary node measures 2.8 cm, image 16/4. -Index left supraclavicular lymph  node measures 2 cm, image 2/4. -enlarged left pre-vascular node measures 1.8 cm, image 19/4. -right paratracheal lymph node measures 2.4 cm, image 17/4. Subcarinal lymph node measures 2.6 cm, image 29/4. -right hilar node measures 2.5 cm, image 25/4. Lungs/Pleura: No pleural effusion. No airspace consolidation identified. Diffuse bilateral lower lung zone predominant centrilobular ground-glass nodules are identified, nonspecific. -Small solid nodule within the posteromedial right upper lobe measures 4 mm, image 51/6. -Subpleural nodule within the posterolateral right upper lobe measures 5 mm, image 23/6. Musculoskeletal: No chest wall mass or suspicious bone lesions identified. CT ABDOMEN PELVIS FINDINGS Hepatobiliary: No focal liver abnormality is seen. No gallstones, gallbladder wall thickening, or biliary dilatation. Pancreas: Unremarkable. No pancreatic ductal dilatation or surrounding inflammatory changes. Spleen: The spleen measures 11.8 x 7.8 by 12.9 cm (volume = 620 cm^3). Adrenals/Urinary Tract: Normal appearance of the adrenal glands. No focal kidney abnormality. No hydronephrosis. No focal bladder abnormality. Bulky bilateral pelvic sidewall lymph nodes have mass effect upon the bladder. Stomach/Bowel: Stomach is normal. The small bowel loops have a normal course and caliber. No pathologic dilatation of the colon. Vascular/Lymphatic: Aortic atherosclerosis. Extensive abdominopelvic adenopathy identified. -porta hepatic node measures 3.1 cm, image 57/4. -gastrohepatic ligament node measures 2.1 cm, image 54/4. -Left retroperitoneal lymph node measures 2 cm, image 75/4. -Portacaval lymph node measures 3.2 cm, image 65/4. -Left common iliac lymph node measures 2.1 cm, image 79/4. -Large right external iliac lymph node measures 3.8 cm, image 106/4. -Left external iliac lymph node measures 2.7 cm, image 106/4. -right inguinal lymph node measures 3.5 cm, image 118/4. -left inguinal lymph node measures 3.1 cm,  image 116/4. Reproductive: Prostate is unremarkable. Other: No abdominal wall hernia or abnormality. No abdominopelvic ascites. Musculoskeletal: Sclerosis within the right femoral head is noted, nonspecific but may reflect early AVN. No acute osseous abnormalities identified. IMPRESSION: 1. Examination is remarkable for extensive thoracic, abdominal, pelvic and inguinal adenopathy compatible with the history of chronic lymphocytic leukemia. 2. Splenomegaly. 3. 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 4. There are 2 small less than 5 mm subpleural pulmonary nodules identified. No follow-up needed if patient is low-risk (and has no known or suspected primary neoplasm). Non-contrast chest CT  can be considered in 12 months if patient is high-risk. This recommendation follows the consensus statement: Guidelines for Management of Incidental Pulmonary Nodules Detected on CT Images: From the Fleischner Society 2017; Radiology 2017; 284:228-243. Electronically Signed   By: Kerby Moors M.D.   On: 04/30/2019 16:45      ASSESSMENT & PLAN:  1. CLL (chronic lymphocytic leukemia) ([facility])   2. Lymphadenopathy   3. Goals of care, counseling/discussion   4. Splenomegaly   5. Lung nodules   # RAI Stage II CLL Labs and medical records were reviewed and discussed with patient Peripheral blood flowcytometry is consistent with CLL.  CT images were independantly reviewed and discussed with patient.  He has extensive Cervical lymphadenopathy, axillary lymphadenopathy, thoracic and abdominal lymphadenopathy, inguinal lymphadenopathy.  No Splenomegaly.  RAI Stage II He has mild anemia and thrombocytopenia, not meeting criteria of upstaging his CLL yet.  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 Marland Kitchen  He does not have constitutional symptoms, and despite extensive lymphadenopathy, no organ damage.  Recommend watchful waiting.  If he developes symptoms in the future, can consider  FCR, fixed duration venetoclax obinutuzumab, ibrutinib, BR etc.   #Subpleural lung nodules, patient is a former smoker.  Will repeat CT in 12 months. # uncontrolled HTN, recommend patient to stay compliant with his BP meds and follow up with PCP for BP control.   Orders Placed This Encounter  Procedures  . CBC with Differential/Platelet    Standing Status:   Future    Standing Expiration Date:   05/04/2020  . Comprehensive metabolic panel    Standing Status:   Future    Standing Expiration Date:   05/04/2020  . Lactate dehydrogenase    Standing Status:   Future    Standing Expiration Date:   05/04/2020    All questions were answered. The patient knows to call the clinic with any problems questions or concerns.  Cc [name], MD   Return of visit: 3 months   Earlie Server, MD, PhD Hematology Oncology [facility] at [facility] Pager- [phone] 05/06/2019

## 2019-07-27 ENCOUNTER — Telehealth: Payer: Self-pay | Admitting: Family Medicine

## 2019-07-27 ENCOUNTER — Telehealth: Payer: Self-pay | Admitting: Oncology

## 2019-07-27 NOTE — Telephone Encounter (Signed)
Left a voicemail for pt regarding his appts on 12/7, waiting for a call back srw

## 2019-07-27 NOTE — Telephone Encounter (Signed)
Called patient and left voicemail regarding his appts on 12/7, will wait for a call back srw

## 2019-07-30 ENCOUNTER — Inpatient Hospital Stay: Payer: Medicare Other | Attending: Oncology

## 2019-07-30 ENCOUNTER — Other Ambulatory Visit: Payer: Self-pay

## 2019-07-30 ENCOUNTER — Encounter (HOSPITAL_COMMUNITY): Payer: Self-pay

## 2019-07-30 DIAGNOSIS — Z79899 Other long term (current) drug therapy: Secondary | ICD-10-CM | POA: Insufficient documentation

## 2019-07-30 DIAGNOSIS — R161 Splenomegaly, not elsewhere classified: Secondary | ICD-10-CM | POA: Insufficient documentation

## 2019-07-30 DIAGNOSIS — E119 Type 2 diabetes mellitus without complications: Secondary | ICD-10-CM | POA: Insufficient documentation

## 2019-07-30 DIAGNOSIS — R591 Generalized enlarged lymph nodes: Secondary | ICD-10-CM | POA: Insufficient documentation

## 2019-07-30 DIAGNOSIS — D696 Thrombocytopenia, unspecified: Secondary | ICD-10-CM | POA: Diagnosis present

## 2019-07-30 DIAGNOSIS — Z9981 Dependence on supplemental oxygen: Secondary | ICD-10-CM

## 2019-07-30 DIAGNOSIS — Z87891 Personal history of nicotine dependence: Secondary | ICD-10-CM | POA: Insufficient documentation

## 2019-07-30 DIAGNOSIS — C9110 Chronic lymphocytic leukemia of B-cell type not having achieved remission: Secondary | ICD-10-CM | POA: Insufficient documentation

## 2019-07-30 DIAGNOSIS — D539 Nutritional anemia, unspecified: Secondary | ICD-10-CM | POA: Diagnosis present

## 2019-07-30 DIAGNOSIS — Z7984 Long term (current) use of oral hypoglycemic drugs: Secondary | ICD-10-CM | POA: Insufficient documentation

## 2019-07-30 DIAGNOSIS — Z888 Allergy status to other drugs, medicaments and biological substances status: Secondary | ICD-10-CM

## 2019-07-30 DIAGNOSIS — J449 Chronic obstructive pulmonary disease, unspecified: Secondary | ICD-10-CM | POA: Diagnosis present

## 2019-07-30 DIAGNOSIS — Z801 Family history of malignant neoplasm of trachea, bronchus and lung: Secondary | ICD-10-CM | POA: Insufficient documentation

## 2019-07-30 DIAGNOSIS — E538 Deficiency of other specified B group vitamins: Secondary | ICD-10-CM | POA: Insufficient documentation

## 2019-07-30 DIAGNOSIS — C9210 Chronic myeloid leukemia, BCR/ABL-positive, not having achieved remission: Principal | ICD-10-CM | POA: Diagnosis present

## 2019-07-30 DIAGNOSIS — J9611 Chronic respiratory failure with hypoxia: Secondary | ICD-10-CM | POA: Diagnosis present

## 2019-07-30 DIAGNOSIS — I10 Essential (primary) hypertension: Secondary | ICD-10-CM | POA: Insufficient documentation

## 2019-07-30 DIAGNOSIS — G4733 Obstructive sleep apnea (adult) (pediatric): Secondary | ICD-10-CM | POA: Diagnosis present

## 2019-07-30 DIAGNOSIS — Z7951 Long term (current) use of inhaled steroids: Secondary | ICD-10-CM | POA: Insufficient documentation

## 2019-07-30 DIAGNOSIS — D630 Anemia in neoplastic disease: Secondary | ICD-10-CM | POA: Diagnosis present

## 2019-07-30 DIAGNOSIS — R531 Weakness: Secondary | ICD-10-CM | POA: Diagnosis not present

## 2019-07-30 DIAGNOSIS — Z7982 Long term (current) use of aspirin: Secondary | ICD-10-CM | POA: Insufficient documentation

## 2019-07-30 DIAGNOSIS — Z20828 Contact with and (suspected) exposure to other viral communicable diseases: Secondary | ICD-10-CM | POA: Diagnosis present

## 2019-07-30 DIAGNOSIS — E785 Hyperlipidemia, unspecified: Secondary | ICD-10-CM | POA: Diagnosis present

## 2019-07-30 DIAGNOSIS — D649 Anemia, unspecified: Secondary | ICD-10-CM | POA: Insufficient documentation

## 2019-07-30 NOTE — ED Triage Notes (Signed)
Pt states a lot of his medications have been adjusted recently and he "just doesn't feel right"  States his doctors know about his symptoms but have not come up with anything specific.

## 2019-07-31 ENCOUNTER — Emergency Department (HOSPITAL_COMMUNITY): Payer: Medicare Other

## 2019-07-31 ENCOUNTER — Other Ambulatory Visit: Payer: Self-pay

## 2019-07-31 ENCOUNTER — Encounter (HOSPITAL_COMMUNITY): Payer: Self-pay

## 2019-07-31 ENCOUNTER — Inpatient Hospital Stay (HOSPITAL_COMMUNITY)
Admission: EM | Admit: 2019-07-31 | Discharge: 2019-08-02 | DRG: 841 | Disposition: A | Discharge status: Home / Self Care | Payer: Medicare Other | Attending: Internal Medicine | Admitting: Internal Medicine

## 2019-07-31 DIAGNOSIS — I10 Essential (primary) hypertension: Secondary | ICD-10-CM | POA: Diagnosis present

## 2019-07-31 DIAGNOSIS — E785 Hyperlipidemia, unspecified: Secondary | ICD-10-CM | POA: Diagnosis present

## 2019-07-31 DIAGNOSIS — Z9981 Dependence on supplemental oxygen: Secondary | ICD-10-CM | POA: Diagnosis not present

## 2019-07-31 DIAGNOSIS — Z7982 Long term (current) use of aspirin: Secondary | ICD-10-CM | POA: Diagnosis not present

## 2019-07-31 DIAGNOSIS — R531 Weakness: Secondary | ICD-10-CM | POA: Diagnosis present

## 2019-07-31 DIAGNOSIS — C9210 Chronic myeloid leukemia, BCR/ABL-positive, not having achieved remission: Secondary | ICD-10-CM | POA: Diagnosis present

## 2019-07-31 DIAGNOSIS — D539 Nutritional anemia, unspecified: Secondary | ICD-10-CM | POA: Diagnosis present

## 2019-07-31 DIAGNOSIS — R569 Unspecified convulsions: Secondary | ICD-10-CM

## 2019-07-31 DIAGNOSIS — R591 Generalized enlarged lymph nodes: Secondary | ICD-10-CM | POA: Diagnosis not present

## 2019-07-31 DIAGNOSIS — Z888 Allergy status to other drugs, medicaments and biological substances status: Secondary | ICD-10-CM | POA: Diagnosis not present

## 2019-07-31 DIAGNOSIS — R590 Localized enlarged lymph nodes: Secondary | ICD-10-CM | POA: Diagnosis present

## 2019-07-31 DIAGNOSIS — D696 Thrombocytopenia, unspecified: Secondary | ICD-10-CM

## 2019-07-31 DIAGNOSIS — J449 Chronic obstructive pulmonary disease, unspecified: Secondary | ICD-10-CM | POA: Diagnosis present

## 2019-07-31 DIAGNOSIS — E119 Type 2 diabetes mellitus without complications: Secondary | ICD-10-CM

## 2019-07-31 DIAGNOSIS — D630 Anemia in neoplastic disease: Secondary | ICD-10-CM | POA: Diagnosis present

## 2019-07-31 DIAGNOSIS — D649 Anemia, unspecified: Secondary | ICD-10-CM | POA: Diagnosis not present

## 2019-07-31 DIAGNOSIS — R161 Splenomegaly, not elsewhere classified: Secondary | ICD-10-CM | POA: Diagnosis not present

## 2019-07-31 DIAGNOSIS — J9611 Chronic respiratory failure with hypoxia: Secondary | ICD-10-CM | POA: Diagnosis present

## 2019-07-31 DIAGNOSIS — Z20828 Contact with and (suspected) exposure to other viral communicable diseases: Secondary | ICD-10-CM | POA: Diagnosis present

## 2019-07-31 DIAGNOSIS — C9110 Chronic lymphocytic leukemia of B-cell type not having achieved remission: Secondary | ICD-10-CM

## 2019-07-31 DIAGNOSIS — Z87891 Personal history of nicotine dependence: Secondary | ICD-10-CM | POA: Diagnosis not present

## 2019-07-31 DIAGNOSIS — R0902 Hypoxemia: Secondary | ICD-10-CM

## 2019-07-31 DIAGNOSIS — Z7189 Other specified counseling: Secondary | ICD-10-CM | POA: Diagnosis not present

## 2019-07-31 DIAGNOSIS — D72829 Elevated white blood cell count, unspecified: Secondary | ICD-10-CM | POA: Diagnosis present

## 2019-07-31 DIAGNOSIS — G4733 Obstructive sleep apnea (adult) (pediatric): Secondary | ICD-10-CM | POA: Diagnosis present

## 2019-07-31 DIAGNOSIS — Z801 Family history of malignant neoplasm of trachea, bronchus and lung: Secondary | ICD-10-CM | POA: Diagnosis not present

## 2019-07-31 DIAGNOSIS — Z7984 Long term (current) use of oral hypoglycemic drugs: Secondary | ICD-10-CM | POA: Diagnosis not present

## 2019-07-31 DIAGNOSIS — Z720 Tobacco use: Secondary | ICD-10-CM | POA: Diagnosis present

## 2019-07-31 LAB — GLUCOSE, CAPILLARY
Glucose-Capillary: 128 mg/dL — ABNORMAL HIGH (ref 70–99)
Glucose-Capillary: 101 mg/dL — ABNORMAL HIGH (ref 70–99)
Glucose-Capillary: 148 mg/dL — ABNORMAL HIGH (ref 70–99)
Glucose-Capillary: 255 mg/dL — ABNORMAL HIGH (ref 70–99)
Glucose-Capillary: 109 mg/dL — ABNORMAL HIGH (ref 70–99)

## 2019-07-31 LAB — CBC
HCT: 24.1 % — ABNORMAL LOW (ref 39.0–52.0)
Hemoglobin: 7.4 g/dL — ABNORMAL LOW (ref 13.0–17.0)
MCH: 29.1 pg (ref 26.0–34.0)
MCHC: 30.7 g/dL (ref 30.0–36.0)
MCV: 94.9 fL (ref 80.0–100.0)
Platelets: 37 10*3/uL — ABNORMAL LOW (ref 150–400)
RBC: 2.54 MIL/uL — ABNORMAL LOW (ref 4.22–5.81)
RDW: 20 % — ABNORMAL HIGH (ref 11.5–15.5)
WBC: 23.3 10*3/uL — ABNORMAL HIGH (ref 4.0–10.5)
nRBC: 0.3 % — ABNORMAL HIGH (ref 0.0–0.2)

## 2019-07-31 LAB — CBG MONITORING, ED: Glucose-Capillary: 211 mg/dL — ABNORMAL HIGH (ref 70–99)

## 2019-07-31 LAB — COMPREHENSIVE METABOLIC PANEL
ALT: 9 U/L (ref 0–44)
AST: 9 U/L — ABNORMAL LOW (ref 15–41)
Albumin: 3.3 g/dL — ABNORMAL LOW (ref 3.5–5.0)
Alkaline Phosphatase: 87 U/L (ref 38–126)
Anion gap: 7 (ref 5–15)
BUN: 23 mg/dL — ABNORMAL HIGH (ref 6–20)
CO2: 31 mmol/L (ref 22–32)
Calcium: 8.4 mg/dL — ABNORMAL LOW (ref 8.9–10.3)
Chloride: 99 mmol/L (ref 98–111)
Creatinine, Ser: 0.8 mg/dL (ref 0.61–1.24)
GFR calc Af Amer: >60 mL/min (ref >60)
GFR calc non Af Amer: >60 mL/min (ref >60)
Glucose, Bld: 192 mg/dL — ABNORMAL HIGH (ref 70–99)
Potassium: 4.2 mmol/L (ref 3.5–5.1)
Sodium: 137 mmol/L (ref 135–145)
Total Bilirubin: 0.4 mg/dL (ref 0.3–1.2)
Total Protein: 7 g/dL (ref 6.5–8.1)

## 2019-07-31 LAB — URINALYSIS, ROUTINE W REFLEX MICROSCOPIC
Bilirubin Urine: NEGATIVE
Glucose, UA: NEGATIVE mg/dL
Hgb urine dipstick: NEGATIVE
Ketones, ur: NEGATIVE mg/dL
Leukocytes,Ua: NEGATIVE
Nitrite: NEGATIVE
Protein, ur: NEGATIVE mg/dL
Specific Gravity, Urine: 1.02 (ref 1.005–1.030)
pH: 5 (ref 5.0–8.0)

## 2019-07-31 LAB — CBC WITH DIFFERENTIAL/PLATELET
Abs Immature Granulocytes: 0.06 10*3/uL (ref 0.00–0.07)
Basophils Absolute: 0 10*3/uL (ref 0.0–0.1)
Basophils Relative: 0 %
Eosinophils Absolute: 0 10*3/uL (ref 0.0–0.5)
Eosinophils Relative: 0 %
HCT: 14.2 % — ABNORMAL LOW (ref 39.0–52.0)
Hemoglobin: 4.2 g/dL — CL (ref 13.0–17.0)
Immature Granulocytes: 0 %
Lymphocytes Relative: 96 %
Lymphs Abs: 16.9 10*3/uL — ABNORMAL HIGH (ref 0.7–4.0)
MCH: 31.1 pg (ref 26.0–34.0)
MCHC: 29.6 g/dL — ABNORMAL LOW (ref 30.0–36.0)
MCV: 105.2 fL — ABNORMAL HIGH (ref 80.0–100.0)
Monocytes Absolute: 0.2 10*3/uL (ref 0.1–1.0)
Monocytes Relative: 1 %
Neutro Abs: 0.5 10*3/uL — ABNORMAL LOW (ref 1.7–7.7)
Neutrophils Relative %: 3 %
Platelets: 37 10*3/uL — ABNORMAL LOW (ref 150–400)
RBC: 1.35 MIL/uL — ABNORMAL LOW (ref 4.22–5.81)
RDW: 20 % — ABNORMAL HIGH (ref 11.5–15.5)
WBC: 17.7 10*3/uL — ABNORMAL HIGH (ref 4.0–10.5)
nRBC: 0.3 % — ABNORMAL HIGH (ref 0.0–0.2)

## 2019-07-31 LAB — BASIC METABOLIC PANEL
Anion gap: 9 (ref 5–15)
BUN: 28 mg/dL — ABNORMAL HIGH (ref 6–20)
CO2: 30 mmol/L (ref 22–32)
Calcium: 8.6 mg/dL — ABNORMAL LOW (ref 8.9–10.3)
Chloride: 97 mmol/L — ABNORMAL LOW (ref 98–111)
Creatinine, Ser: 0.83 mg/dL (ref 0.61–1.24)
GFR calc Af Amer: >60 mL/min (ref >60)
GFR calc non Af Amer: >60 mL/min (ref >60)
Glucose, Bld: 151 mg/dL — ABNORMAL HIGH (ref 70–99)
Potassium: 4.3 mmol/L (ref 3.5–5.1)
Sodium: 136 mmol/L (ref 135–145)

## 2019-07-31 LAB — RETICULOCYTES
Immature Retic Fract: 24.8 % — ABNORMAL HIGH (ref 2.3–15.9)
RBC.: 1.38 MIL/uL — ABNORMAL LOW (ref 4.22–5.81)
Retic Count, Absolute: 42.4 10*3/uL (ref 19.0–186.0)
Retic Ct Pct: 3.1 % (ref 0.4–3.1)

## 2019-07-31 LAB — LACTATE DEHYDROGENASE: LDH: 134 U/L (ref 98–192)

## 2019-07-31 LAB — SARS CORONAVIRUS 2 (TAT 6-24 HRS): SARS Coronavirus 2: NEGATIVE

## 2019-07-31 LAB — IRON AND TIBC
Iron: 125 ug/dL (ref 45–182)
Saturation Ratios: 31 % (ref 17.9–39.5)
TIBC: 402 ug/dL (ref 250–450)
UIBC: 277 ug/dL

## 2019-07-31 LAB — FERRITIN: Ferritin: 294 ng/mL (ref 24–336)

## 2019-07-31 LAB — FOLATE: Folate: 8.7 ng/mL (ref >5.9)

## 2019-07-31 LAB — ABO/RH: ABO/RH(D): O POS

## 2019-07-31 LAB — PREPARE RBC (CROSSMATCH)

## 2019-07-31 LAB — POC OCCULT BLOOD, ED: Fecal Occult Bld: NEGATIVE

## 2019-07-31 LAB — HEMOGLOBIN A1C
Hgb A1c MFr Bld: 7.7 % — ABNORMAL HIGH (ref 4.8–5.6)
Mean Plasma Glucose: 174.29 mg/dL

## 2019-07-31 LAB — VITAMIN B12: Vitamin B-12: 144 pg/mL — ABNORMAL LOW (ref 180–914)

## 2019-07-31 MED ORDER — SODIUM CHLORIDE 0.9% IV SOLUTION
Freq: Once | INTRAVENOUS | Status: AC
  Administered 2019-07-31: 10 mL via INTRAVENOUS

## 2019-07-31 MED ORDER — BISACODYL 10 MG RE SUPP: 10.0000 mg | Freq: Every day | RECTAL | Status: DC | PRN

## 2019-07-31 MED ORDER — IPRATROPIUM-ALBUTEROL 0.5-2.5 (3) MG/3ML IN SOLN: 3.0000 mL | Freq: Four times a day (QID) | RESPIRATORY_TRACT | Status: DC | PRN

## 2019-07-31 MED ORDER — TRAMADOL HCL 50 MG PO TABS: 50.0000 mg | ORAL_TABLET | Freq: Three times a day (TID) | ORAL | Status: DC | PRN

## 2019-07-31 MED ORDER — LISINOPRIL 10 MG PO TABS
10.0000 mg | ORAL_TABLET | Freq: Every day | ORAL | Status: DC
  Administered 2019-07-31 – 2019-08-02 (×3): 10 mg via ORAL
  Filled 2019-07-31 (×3): qty 1

## 2019-07-31 MED ORDER — SODIUM CHLORIDE 0.9% FLUSH: 3.0000 mL | INTRAVENOUS | Status: DC | PRN

## 2019-07-31 MED ORDER — ACETAMINOPHEN 650 MG RE SUPP: 650.0000 mg | Freq: Four times a day (QID) | RECTAL | Status: DC | PRN

## 2019-07-31 MED ORDER — INSULIN ASPART 100 UNIT/ML ~~LOC~~ SOLN
0.0000 [IU] | Freq: Three times a day (TID) | SUBCUTANEOUS | Status: DC
  Administered 2019-07-31: 2 [IU] via SUBCUTANEOUS
  Administered 2019-07-31: 8 [IU] via SUBCUTANEOUS
  Administered 2019-08-01: 5 [IU] via SUBCUTANEOUS
  Administered 2019-08-01: 2 [IU] via SUBCUTANEOUS
  Administered 2019-08-02: 3 [IU] via SUBCUTANEOUS

## 2019-07-31 MED ORDER — FLUTICASONE FUROATE-VILANTEROL 200-25 MCG/INH IN AEPB
1.0000 | INHALATION_SPRAY | Freq: Every day | RESPIRATORY_TRACT | Status: DC
  Administered 2019-07-31 – 2019-08-01 (×2): 1 via RESPIRATORY_TRACT
  Filled 2019-07-31: qty 28

## 2019-07-31 MED ORDER — SODIUM CHLORIDE 0.9% FLUSH
3.0000 mL | Freq: Two times a day (BID) | INTRAVENOUS | Status: DC
  Administered 2019-07-31 – 2019-08-02 (×4): 3 mL via INTRAVENOUS

## 2019-07-31 MED ORDER — SODIUM CHLORIDE 0.9 % IV SOLN
10.0000 mL/h | Freq: Once | INTRAVENOUS | Status: AC
  Administered 2019-07-31: 10 mL/h via INTRAVENOUS

## 2019-07-31 MED ORDER — AMLODIPINE BESYLATE 5 MG PO TABS
5.0000 mg | ORAL_TABLET | Freq: Every day | ORAL | Status: DC
  Administered 2019-07-31 – 2019-08-02 (×3): 5 mg via ORAL
  Filled 2019-07-31 (×3): qty 1

## 2019-07-31 MED ORDER — ONDANSETRON HCL 4 MG PO TABS: 4.0000 mg | ORAL_TABLET | Freq: Four times a day (QID) | ORAL | Status: DC | PRN

## 2019-07-31 MED ORDER — POLYETHYLENE GLYCOL 3350 17 G PO PACK: 17.0000 g | PACK | Freq: Every day | ORAL | Status: DC | PRN

## 2019-07-31 MED ORDER — ORAL CARE MOUTH RINSE
15.0000 mL | Freq: Two times a day (BID) | OROMUCOSAL | Status: DC
  Administered 2019-07-31 – 2019-08-02 (×5): 15 mL via OROMUCOSAL

## 2019-07-31 MED ORDER — ENSURE ENLIVE PO LIQD
237.0000 mL | Freq: Two times a day (BID) | ORAL | Status: DC
  Administered 2019-07-31 – 2019-08-02 (×5): 237 mL via ORAL

## 2019-07-31 MED ORDER — ACETAMINOPHEN 325 MG PO TABS: 650.0000 mg | ORAL_TABLET | Freq: Four times a day (QID) | ORAL | Status: DC | PRN

## 2019-07-31 MED ORDER — METOPROLOL TARTRATE 25 MG PO TABS
25.0000 mg | ORAL_TABLET | Freq: Two times a day (BID) | ORAL | Status: DC
  Administered 2019-07-31 – 2019-08-02 (×5): 25 mg via ORAL
  Filled 2019-07-31 (×5): qty 1

## 2019-07-31 MED ORDER — SODIUM CHLORIDE 0.9 % IV SOLN: 250.0000 mL | INTRAVENOUS | Status: DC | PRN

## 2019-07-31 MED ORDER — UMECLIDINIUM BROMIDE 62.5 MCG/INH IN AEPB
1.0000 | INHALATION_SPRAY | Freq: Every day | RESPIRATORY_TRACT | Status: DC
  Administered 2019-07-31 – 2019-08-01 (×2): 1 via RESPIRATORY_TRACT
  Filled 2019-07-31: qty 7

## 2019-07-31 MED ORDER — LACTATED RINGERS IV SOLN
INTRAVENOUS | Status: DC
  Administered 2019-07-31 – 2019-08-01 (×2): via INTRAVENOUS

## 2019-07-31 MED ORDER — ALBUTEROL SULFATE HFA 108 (90 BASE) MCG/ACT IN AERS
2.0000 | INHALATION_SPRAY | Freq: Four times a day (QID) | RESPIRATORY_TRACT | Status: DC | PRN
  Filled 2019-07-31: qty 6.7

## 2019-07-31 MED ORDER — ONDANSETRON HCL 4 MG/2ML IJ SOLN: 4.0000 mg | Freq: Four times a day (QID) | INTRAMUSCULAR | Status: DC | PRN

## 2019-07-31 MED ORDER — ATORVASTATIN CALCIUM 20 MG PO TABS
20.0000 mg | ORAL_TABLET | Freq: Every day | ORAL | Status: DC
  Administered 2019-07-31 – 2019-08-01 (×2): 20 mg via ORAL
  Filled 2019-07-31 (×2): qty 1

## 2019-07-31 MED ORDER — INSULIN ASPART 100 UNIT/ML ~~LOC~~ SOLN: 0.0000 [IU] | Freq: Every day | SUBCUTANEOUS | Status: DC

## 2019-07-31 NOTE — H&P (Addendum)
History and Physical    [name] Bais ZHG:[id number] DOB: February 04, 1971 DOA: 07/31/2019  PCP: [name], MD  Patient coming from: Home  I have personally briefly reviewed patient's old medical records in [place]  Chief Complaint: Increased weakness  HPI: Larry Lam is a 48 y.o. male with medical history significant of COPD, asthma, hypertension, diabetes, nicotine dependence who presented to the ER with increased weakness.  Patient reports having generalized weakness for the past few weeks, reports fatigue, has had episodes of dizziness and lightheadedness with exertion.  Patient reported he saw his pulmonologist as outpatient who gave him a 10-day course of Augmentin for presumptive respiratory infection.  He reports he had marginal improvement for a couple of days but then his symptoms persisted. No fever, chills, diaphoresis, seizure episodes, focal deficits, abdominal pain, diarrhea, chest pain, cough.  Does have chronic shortness of breath.  Is on home oxygen and came in tonight with his oxygen saturation dropped after he took off his oxygen. ED Course:  Vital Signs reviewed on presentation, significant for temperature 98.3, heart rate 120, blood pressure 118/53, saturation 95% on 3 L nasal cannula. Labs reviewed, significant for sodium 137, potassium 4.2, chloride 99, BUN 23, creatinine 0.8, LFTs within normal, LDH 134, total iron 125, ferritin 294, folate 8.7, B12 144, WBC count 17.7, hemoglobin 4.2, hematocrit 14.2, platelets 37, SARS-CoV-2 RT-PCR is pending.  Fecal occult blood is negative.  Urinalysis is unremarkable. Imaging personally Reviewed, chest x-ray shows increased interstitial markings suggestive of atypical pneumonia.   Review of Systems: As per HPI otherwise 10 point review of systems negative.  All other review of systems is negative except the ones noted above in the HPI.  Past Medical History:  Diagnosis Date   Asthma    COPD (chronic  obstructive pulmonary disease) ([place])    Diabetes mellitus without complication ([place])    Hypertension    Seizures ([place])    3-4 years ago. Single episode.   Tobacco use     History reviewed. No pertinent surgical history.   reports that he quit smoking about 4 months ago. His smoking use included cigarettes. He has a 33.00 pack-year smoking history. He has quit using smokeless tobacco. He reports current alcohol use. He reports that he does not use drugs.  Allergies  Allergen Reactions   Amlodipine Besylate-Valsartan Other (See Comments)    Lowered potassium   Hydrochlorothiazide Other (See Comments)    Lowered potassium    Family History  Problem Relation Age of Onset   Lung cancer Maternal Grandfather    Family history reviewed, noted as above, not pertinent to current presentation.   Prior to Admission medications   Medication Sig Start Date End Date Taking? Authorizing Provider  albuterol (PROVENTIL HFA;VENTOLIN HFA) 108 (90 Base) MCG/ACT inhaler Inhale 2 puffs into the lungs every 6 (six) hours as needed for wheezing or shortness of breath. 04/29/17   Orson Eva, MD  albuterol (VENTOLIN HFA) 108 (90 Base) MCG/ACT inhaler Inhale into the lungs. 04/06/19   [provider]  amLODipine (NORVASC) 10 MG tablet Take 1 tablet (10 mg total) by mouth daily. 04/30/17   Orson Eva, MD  ASPIRIN 81 PO Take 81 mg by mouth.    [provider]  atorvastatin (LIPITOR) 10 MG tablet Take by mouth.    [provider]  Fluticasone-Salmeterol (ADVAIR) 250-50 MCG/DOSE AEPB Inhale into the lungs. 04/06/19   [provider]  glipiZIDE (GLUCOTROL) 5 MG tablet Take 1 tablet (  5 mg total) by mouth 2 (two) times daily. 04/29/17 05/04/19  Orson Eva, MD  ipratropium-albuterol (DUONEB) 0.5-2.5 (3) MG/3ML SOLN Take 3 mLs by nebulization every 6 (six) hours as needed. J44.9 and J44.1 04/29/17   Tat, Shanon Brow, MD  lisinopril (PRINIVIL,ZESTRIL) 20 MG tablet Take 1 tablet (20 mg total)  by mouth daily. 04/30/17   Orson Eva, MD  metFORMIN (GLUCOPHAGE) 500 MG tablet Take 1 tablet (500 mg total) by mouth 2 (two) times daily with a meal. Patient taking differently: Take 1,000 mg by mouth 2 (two) times daily with a meal.  04/29/17   Tat, Shanon Brow, MD  metoprolol tartrate (LOPRESSOR) 25 MG tablet Take 1 tablet (25 mg total) by mouth 2 (two) times daily. 04/29/17   Orson Eva, MD  predniSONE (DELTASONE) 10 MG tablet Take 6 tablets (60 mg total) by mouth daily with breakfast. And decrease by one tablet daily Patient not taking: Reported on 04/20/2019 04/30/17   Tat, Shanon Brow, MD  umeclidinium bromide (INCRUSE ELLIPTA) 62.5 MCG/INH AEPB Inhale into the lungs. 04/06/19   [provider]    Physical Exam: Vitals:   07/30/19 2321 07/31/19 0105 07/31/19 0200 07/31/19 0319  BP: 120/60 (!) 141/66 132/62 134/63  Pulse: (!) 102 (!) 103 (!) 129 (!) 126  Resp: (!) 21 (!) 21 (!) 28 (!) 26  Temp: 98.6 F (37 C)   98.4 F (36.9 C)  TempSrc: Oral     SpO2: 96% 99% 100% 94%  Weight:      Height:        Constitutional: NAD, calm, comfortable Vitals:   07/30/19 2321 07/31/19 0105 07/31/19 0200 07/31/19 0319  BP: 120/60 (!) 141/66 132/62 134/63  Pulse: (!) 102 (!) 103 (!) 129 (!) 126  Resp: (!) 21 (!) 21 (!) 28 (!) 26  Temp: 98.6 F (37 C)   98.4 F (36.9 C)  TempSrc: Oral     SpO2: 96% 99% 100% 94%  Weight:      Height:       Eyes: PERRL, lids and conjunctivae normal ENMT: Mucous membranes are moist. Posterior pharynx clear of any exudate or lesions.Normal dentition.  Neck: normal, supple, has bilateral cervical lymphadenopathy, more on the right, no thyromegaly Respiratory: clear to auscultation bilaterally, no wheezing, no crackles. Normal respiratory effort. No accessory muscle use.  Bilateral axilla lymphadenopathy noted. Cardiovascular: Regular rate and rhythm, no murmurs / rubs / gallops. No extremity edema. 2+ pedal pulses. No carotid bruits.  Abdomen: no tenderness, no masses  palpated. No hepatosplenomegaly. Bowel sounds positive.  Musculoskeletal: no clubbing / cyanosis. No joint deformity upper and lower extremities. Good ROM, no contractures. Normal muscle tone.  Skin: no rashes, lesions, ulcers. No induration Neurologic: CN 2-12 grossly intact. Sensation intact, DTR normal. Strength 5/5 in all 4.  Psychiatric: Normal judgment and insight. Alert and oriented x 3. Normal mood.    Decubitus Ulcers: Not present on admission Catheters and tubes: None   Labs on Admission: I have personally reviewed following labs and imaging studies  CBC: Recent Labs  Lab 07/31/19 0152  WBC 17.7*  NEUTROABS 0.5*  HGB 4.2*  HCT 14.2*  MCV 105.2*  PLT 37*   Basic Metabolic Panel: Recent Labs  Lab 07/31/19 0152  NA 137  K 4.2  CL 99  CO2 31  GLUCOSE 192*  BUN 23*  CREATININE 0.80  CALCIUM 8.4*   GFR: Estimated Creatinine Clearance: 133.7 mL/min (by C-G formula based on SCr of 0.8 mg/dL). Liver Function Tests: Recent Labs  Lab 07/31/19 0152  AST 9*  ALT 9  ALKPHOS 87  BILITOT 0.4  PROT 7.0  ALBUMIN 3.3*   No results for input(s): LIPASE, AMYLASE in the last 168 hours. No results for input(s): AMMONIA in the last 168 hours. Coagulation Profile: No results for input(s): INR, PROTIME in the last 168 hours. Cardiac Enzymes: No results for input(s): CKTOTAL, CKMB, CKMBINDEX, TROPONINI in the last 168 hours. BNP (last 3 results) No results for input(s): PROBNP in the last 8760 hours. HbA1C: No results for input(s): HGBA1C in the last 72 hours. CBG: Recent Labs  Lab 07/31/19 0111  GLUCAP 211*   Lipid Profile: No results for input(s): CHOL, HDL, LDLCALC, TRIG, CHOLHDL, LDLDIRECT in the last 72 hours. Thyroid Function Tests: No results for input(s): TSH, T4TOTAL, FREET4, T3FREE, THYROIDAB in the last 72 hours. Anemia Panel: Recent Labs    07/31/19 0152  RETICCTPCT 3.1   Urine analysis:    Component Value Date/Time   COLORURINE YELLOW  07/31/2019 0202   APPEARANCEUR CLEAR 07/31/2019 0202   APPEARANCEUR Clear 07/05/2012 0103   LABSPEC 1.020 07/31/2019 0202   LABSPEC 1.033 07/05/2012 0103   PHURINE 5.0 07/31/2019 0202   GLUCOSEU NEGATIVE 07/31/2019 0202   GLUCOSEU >=500 07/05/2012 0103   HGBUR NEGATIVE 07/31/2019 0202   BILIRUBINUR NEGATIVE 07/31/2019 0202   BILIRUBINUR Negative 07/05/2012 0103   KETONESUR NEGATIVE 07/31/2019 0202   PROTEINUR NEGATIVE 07/31/2019 0202   NITRITE NEGATIVE 07/31/2019 0202   LEUKOCYTESUR NEGATIVE 07/31/2019 0202   LEUKOCYTESUR Negative 07/05/2012 0103    Radiological Exams on Admission: Dg Chest 2 View  Result Date: 07/31/2019 CLINICAL DATA:  Shortness of breath. EXAM: CHEST - 2 VIEW COMPARISON:  April 27, 2017 FINDINGS: Cardiomediastinal silhouette is stable. No pneumothorax. No nodules or masses. Increased lung markings bilaterally with interstitial components. There is a small left pleural effusion. IMPRESSION: 1. Increase lung markings centrally with interstitial components suggestive of atypical infection/pneumonia. Bronchitis is a possibility. There is a small associated left effusion. Electronically Signed   By: Dorise Bullion III M.D   On: 07/31/2019 02:34      Assessment/Plan Active Problems:   Seizures ([place])   Hypertension   Leucocytosis   DM (diabetes mellitus) ([place])   Chronic obstructive pulmonary disease ([facility])   Tobacco use   Cervical lymphadenopathy   CML (chronic myelocytic leukemia) ([facility])     Principal Problem: Acute on Chronic Anemia Patient presented with hemoglobin 4.2.  There appears to be a precipitous drop in H&H with most recent value being 12.4 in August 2018.  Fecal occult blood is negative.  This appears likely secondary to recent diagnosis of CLL.  No bone marrow biopsy on record. Will transfuse 2 units packed red cells and monitor CBC closely. Goal will be around 7-8.  Other Active Problems:  Chronic lymphocytic leukemia Patient has cervical,  axillary mediastinal and hilar lymphadenopathy peripheral blood flow cytometry with results consistent with diagnosis of CLL, CD5 plus, CD23 plus CD38 negative.  No bone marrow biopsy on record. Patient was previously being followed by Dr. Tasia Catchings at [place] [facility] and was most recently seen in August 2020.  Has not been started on any treatment currently. We will need to get oncology evaluation in a.m. due to significant change in CBC  Thrombocytopenia Platelet count appears to be declining significantly.  Most recent platelet count is 123 on 04/23/2019.  Currently with 5.  No bleeding episodes reported.  Chronic respiratory failure on home oxygen: Currently  on 2 L as per home O2 requirements.  Continue and titrate as needed  Hypertension: Continue amlodipine, metoprolol, lisinopril as tolerated  Hyperlipidemia: Continue Lipitor, aspirin  COPD: Does not appear to be acute exacerbation Continue Incruse Ellipta, Advair, albuterol as needed  Diabetes mellitus type 2: Most recent A1c was 8.9 in September 2018. Hold glipizide, Metformin We will place on sliding scale insulin coverage fingerstick monitoring Hemoglobin A1c in a.m.  History of obstructive sleep apnea: Patient does not like using CPAP and is only on oxygen at night.  DVT prophylaxis: SCDs Code Status:  Full code Family Communication: N/A  Disposition Plan: Admit as inpatient for worsening anemia, thrombocytopenia, CLL.  Will need oncology evaluation, expect 3 to 4-day stay Consults called: N/A Admission status: Inpatient   Lynetta Mare MD [facility]  If 7PM-7AM, please contact night-coverage   07/31/2019, 3:33 AM

## 2019-07-31 NOTE — ED Notes (Signed)
Date and time results received: 07/31/19 0226  Test: Hgb Critical Value: 4.2  Name of Provider Notified: Roxanne Mins, MD

## 2019-07-31 NOTE — Progress Notes (Signed)
Per HPI: Larry Lam is a 48 y.o. male with medical history significant of COPD, asthma, hypertension, diabetes, nicotine dependence who presented to the ER with increased weakness.  Patient reports having generalized weakness for the past few weeks, reports fatigue, has had episodes of dizziness and lightheadedness with exertion.  Patient reported he saw his pulmonologist as outpatient who gave him a 10-day course of Augmentin for presumptive respiratory infection.  He reports he had marginal improvement for a couple of days but then his symptoms persisted. No fever, chills, diaphoresis, seizure episodes, focal deficits, abdominal pain, diarrhea, chest pain, cough.  Does have chronic shortness of breath.  Is on home oxygen and came in tonight with his oxygen saturation dropped after he took off his oxygen.  12/5: Patient was admitted with acute on chronic anemia related to his recent diagnosis of CLL.  He was initially prescribed 2 units of PRBCs and states that he started to feel much better overall and reports no further weakness or fatigue.  I have added 1/3 unit as well as some platelets and will plan to follow hemoglobin and hematocrit after transfusion as well as repeat CBC in a.m.  If he continues to remain stable in a.m. and has ongoing improvement, will plan for discharge at that time.  His Covid testing is currently pending.  Total care time: 20 minutes.

## 2019-07-31 NOTE — ED Provider Notes (Signed)
[facility] EMERGENCY DEPARTMENT Provider Note   CSN: [id number] Arrival date & time: 07/30/19  2159    History   Chief Complaint Chief Complaint  Patient presents with  . Weakness    HPI Larry Lam is a 48 y.o. male.   The history is provided by the patient.  Weakness He has history of hypertension, diabetes, COPD, chronic lymphocytic leukemia and comes in because of generally not feeling well.  He is an extremely poor historian, but apparently, for about 6 weeks, he has felt like he has no energy and he has been weak.  He did see his pulmonologist who gave him a 10-day course of Augmentin which actually seemed to help for a brief period.  However, his generalized weakness has persisted since then.  He also has episodes of dizziness which feels like a presyncopal event, but it resolves within a few seconds.  Sometimes, if he is looking at lights when this happens, he will develop some concurrent nausea.  Otherwise, he has not had any nausea.  He denies fever, chills, sweats.  He denies arthralgias or myalgias.  He has had some increase in his doses of medications for his blood sugar and he thinks that these might be making him feel weak.  He came in tonight because he took off his home oxygen and his oxygen saturation dropped.  He has not noticed any change in his baseline shortness of breath.  He is not having any cough.  Past Medical History:  Diagnosis Date  . Asthma   . COPD (chronic obstructive pulmonary disease) ([place])   . Diabetes mellitus without complication ([place])   . Hypertension   . Seizures ([place])    3-4 years ago. Single episode.  . Tobacco use     Patient Active Problem List   Diagnosis Date Noted  . Lymphadenopathy 05/06/2019  . Splenomegaly 05/06/2019  . Lung nodules 05/06/2019  . CLL (chronic lymphocytic leukemia) ([place]) 04/21/2019  . Cervical lymphadenopathy 04/21/2019  . Shortness of breath 04/21/2019  . Goals of care, counseling/discussion  04/21/2019  . Chest pain   . Hypophosphatemia 04/28/2017  . Acute respiratory failure with hypoxia ([place]) 04/28/2017  . Uncontrolled type 2 diabetes mellitus with hyperglycemia, without long-term current use of insulin ([place]) 04/28/2017  . COPD exacerbation ([place]) 04/27/2017  . Tobacco use 04/27/2017  . Seizures ([place]) 01/19/2015  . Hypertension 01/19/2015  . Hypokalemia 01/19/2015  . Leucocytosis 01/19/2015  . DM (diabetes mellitus) ([place]) 01/19/2015  . Chronic obstructive pulmonary disease ([place]) 01/19/2015    History reviewed. No pertinent surgical history.      Home Medications    Prior to Admission medications   Medication Sig Start Date End Date Taking? Authorizing Provider  albuterol (PROVENTIL HFA;VENTOLIN HFA) 108 (90 Base) MCG/ACT inhaler Inhale 2 puffs into the lungs every 6 (six) hours as needed for wheezing or shortness of breath. 04/29/17   Orson Eva, MD  albuterol (VENTOLIN HFA) 108 (90 Base) MCG/ACT inhaler Inhale into the lungs. 04/06/19   [provider]  amLODipine (NORVASC) 10 MG tablet Take 1 tablet (10 mg total) by mouth daily. 04/30/17   Orson Eva, MD  ASPIRIN 81 PO Take 81 mg by mouth.    [provider]  atorvastatin (LIPITOR) 10 MG tablet Take by mouth.    [provider]  Fluticasone-Salmeterol (ADVAIR) 250-50 MCG/DOSE AEPB Inhale into the lungs. 04/06/19   [provider]  glipiZIDE (GLUCOTROL) 5 MG tablet Take 1 tablet (5 mg total) by  mouth 2 (two) times daily. 04/29/17 05/04/19  Orson Eva, MD  ipratropium-albuterol (DUONEB) 0.5-2.5 (3) MG/3ML SOLN Take 3 mLs by nebulization every 6 (six) hours as needed. J44.9 and J44.1 04/29/17   Tat, Shanon Brow, MD  lisinopril (PRINIVIL,ZESTRIL) 20 MG tablet Take 1 tablet (20 mg total) by mouth daily. 04/30/17   Orson Eva, MD  metFORMIN (GLUCOPHAGE) 500 MG tablet Take 1 tablet (500 mg total) by mouth 2 (two) times daily with a meal. Patient taking differently: Take 1,000 mg by mouth 2 (two) times daily  with a meal.  04/29/17   Tat, Shanon Brow, MD  metoprolol tartrate (LOPRESSOR) 25 MG tablet Take 1 tablet (25 mg total) by mouth 2 (two) times daily. 04/29/17   Orson Eva, MD  predniSONE (DELTASONE) 10 MG tablet Take 6 tablets (60 mg total) by mouth daily with breakfast. And decrease by one tablet daily Patient not taking: Reported on 04/20/2019 04/30/17   Tat, Shanon Brow, MD  umeclidinium bromide (INCRUSE ELLIPTA) 62.5 MCG/INH AEPB Inhale into the lungs. 04/06/19   [provider]    Family History Family History  Problem Relation Age of Onset  . Lung cancer Maternal Grandfather     Social History Social History   Tobacco Use  . Smoking status: Former Smoker    Packs/day: 1.00    Years: 33.00    Pack years: 33.00    Types: Cigarettes    Quit date: 03/23/2019    Years since quitting: 0.3  . Smokeless tobacco: Former Network engineer Use Topics  . Alcohol use: Yes    Alcohol/week: 0.0 standard drinks    Comment: rare  . Drug use: No    Comment: smokes pot     Allergies   Amlodipine besylate-valsartan and Hydrochlorothiazide   Review of Systems Review of Systems  Neurological: Positive for weakness.  All other systems reviewed and are negative.    Physical Exam Updated Vital Signs BP (!) 141/66 (BP Location: Right Arm)   Pulse (!) 103   Temp 98.6 F (37 C) (Oral)   Resp (!) 21   Ht 5\' 10"  (1.778 m)   Wt 102.1 kg   SpO2 99%   BMI 32.28 kg/m   Physical Exam Vitals signs and nursing note reviewed.    48 year old male, resting comfortably and in no acute distress. Vital signs are significant for borderline elevated respiratory rate, borderline elevated heart rate, borderline elevated blood pressure. Oxygen saturation is 99%, which is normal. Head is normocephalic and atraumatic. PERRLA, EOMI. Oropharynx is clear. Neck is nontender and supple with posterior cervical adenopathy present bilaterally.  There is no JVD. Back is nontender and there is no CVA tenderness.  Lungs are clear without rales, wheezes, or rhonchi. Chest is nontender. Heart has regular rate and rhythm without murmur. Abdomen is soft, flat, nontender without masses or hepatosplenomegaly and peristalsis is normoactive. Extremities have no cyanosis or edema, full range of motion is present. Skin is warm and dry without rash. Neurologic: Mental status is normal, cranial nerves are intact, there are no motor or sensory deficits.  There is no dizziness elicited with passive head movement.  ED Treatments / Results  Labs (all labs ordered are listed, but only abnormal results are displayed) Labs Reviewed  COMPREHENSIVE METABOLIC PANEL - Abnormal; Notable for the following components:      Result Value   Glucose, Bld 192 (*)    BUN 23 (*)    Calcium 8.4 (*)    Albumin 3.3 (*)  AST 9 (*)    All other components within normal limits  CBC WITH DIFFERENTIAL/PLATELET - Abnormal; Notable for the following components:   WBC 17.7 (*)    RBC 1.35 (*)    Hemoglobin 4.2 (*)    HCT 14.2 (*)    MCV 105.2 (*)    MCHC 29.6 (*)    RDW 20.0 (*)    Platelets 37 (*)    nRBC 0.3 (*)    Neutro Abs 0.5 (*)    Lymphs Abs 16.9 (*)    All other components within normal limits  CBG MONITORING, ED - Abnormal; Notable for the following components:   Glucose-Capillary 211 (*)    All other components within normal limits  SARS CORONAVIRUS 2 (TAT 6-24 HRS)  URINALYSIS, ROUTINE W REFLEX MICROSCOPIC  VITAMIN B12  FOLATE  IRON AND TIBC  FERRITIN  RETICULOCYTES  OCCULT BLOOD X 1 CARD TO LAB, STOOL  LACTATE DEHYDROGENASE  PREPARE RBC (CROSSMATCH)  TYPE AND SCREEN   Procedures Procedures  CRITICAL CARE Performed by: Delora Fuel Total critical care time: 45 minutes Critical care time was exclusive of separately billable procedures and treating other patients. Critical care was necessary to treat or prevent imminent or life-threatening deterioration. Critical care was time spent personally by me on  the following activities: development of treatment plan with patient and/or surrogate as well as nursing, discussions with consultants, evaluation of patient's response to treatment, examination of patient, obtaining history from patient or surrogate, ordering and performing treatments and interventions, ordering and review of laboratory studies, ordering and review of radiographic studies, pulse oximetry and re-evaluation of patient's condition.  Medications Ordered in ED Medications  0.9 %  sodium chloride infusion (has no administration in time range)     Initial Impression / Assessment and Plan / ED Course  I have reviewed the triage vital signs and the nursing notes.  Pertinent lab results that were available during my care of the patient were reviewed by me and considered in my medical decision making (see chart for details).  Generalized fatigue and weakness.  I have reviewed his old records and original diagnosis of CLL was made in 2013, but CBCs did not show significant lymphocytosis until August of this year when WBC was 25.3 with 80% lymphocytes.  I am not concerned about his hypoxia when taken off of supplemental oxygen since he is supposed to be on oxygen all the time.  Will check screening labs including CBC.  I suspect that his symptoms are actually from progression of his underlying leukemia.  Labs show hemoglobin 4.2 with MCV of 105.2.  Platelet count is low at 37,000.  WBC is only 17.7, but 96% lymphocytes.  This is suggestive of a myelophthisic process.  Will check stool Hemoccult to make sure there is no blood loss contributing to his anemia.  He will be given blood transfusion and will admit for further work-up.  Case is discussed with Dr. Neomia Glass of [facility], who agrees to admit the patient.  Final Clinical Impressions(s) / ED Diagnoses   Final diagnoses:  Macrocytic anemia  CLL (chronic lymphocytic leukemia) ([place])  Thrombocytopenia [facility])    ED Discharge Orders     None       Delora Fuel, MD [id number] [phone]

## 2019-08-01 ENCOUNTER — Inpatient Hospital Stay (HOSPITAL_COMMUNITY): Payer: Medicare Other

## 2019-08-01 DIAGNOSIS — C9210 Chronic myeloid leukemia, BCR/ABL-positive, not having achieved remission: Secondary | ICD-10-CM | POA: Diagnosis not present

## 2019-08-01 DIAGNOSIS — D649 Anemia, unspecified: Secondary | ICD-10-CM | POA: Diagnosis not present

## 2019-08-01 LAB — BRAIN NATRIURETIC PEPTIDE: B Natriuretic Peptide: 172 pg/mL — ABNORMAL HIGH (ref 0.0–100.0)

## 2019-08-01 LAB — CBC
HCT: 23.4 % — ABNORMAL LOW (ref 39.0–52.0)
Hemoglobin: 7.1 g/dL — ABNORMAL LOW (ref 13.0–17.0)
MCH: 28.9 pg (ref 26.0–34.0)
MCHC: 30.3 g/dL (ref 30.0–36.0)
MCV: 95.1 fL (ref 80.0–100.0)
Platelets: 40 10*3/uL — ABNORMAL LOW (ref 150–400)
RBC: 2.46 MIL/uL — ABNORMAL LOW (ref 4.22–5.81)
RDW: 19.9 % — ABNORMAL HIGH (ref 11.5–15.5)
WBC: 21.1 10*3/uL — ABNORMAL HIGH (ref 4.0–10.5)
nRBC: 0.3 % — ABNORMAL HIGH (ref 0.0–0.2)

## 2019-08-01 LAB — GLUCOSE, CAPILLARY
Glucose-Capillary: 107 mg/dL — ABNORMAL HIGH (ref 70–99)
Glucose-Capillary: 244 mg/dL — ABNORMAL HIGH (ref 70–99)
Glucose-Capillary: 145 mg/dL — ABNORMAL HIGH (ref 70–99)
Glucose-Capillary: 140 mg/dL — ABNORMAL HIGH (ref 70–99)
Glucose-Capillary: 157 mg/dL — ABNORMAL HIGH (ref 70–99)

## 2019-08-01 LAB — HIV ANTIBODY (ROUTINE TESTING W REFLEX): HIV Screen 4th Generation wRfx: NONREACTIVE

## 2019-08-01 LAB — HEMOGLOBIN AND HEMATOCRIT, BLOOD
HCT: 26.1 % — ABNORMAL LOW (ref 39.0–52.0)
Hemoglobin: 8 g/dL — ABNORMAL LOW (ref 13.0–17.0)

## 2019-08-01 LAB — PREPARE RBC (CROSSMATCH)

## 2019-08-01 MED ORDER — SODIUM CHLORIDE 0.9% IV SOLUTION
Freq: Once | INTRAVENOUS | Status: AC
  Administered 2019-08-01: 09:00:00 via INTRAVENOUS

## 2019-08-01 MED ORDER — BUDESONIDE 0.25 MG/2ML IN SUSP
0.2500 mg | Freq: Two times a day (BID) | RESPIRATORY_TRACT | Status: DC
  Administered 2019-08-01 – 2019-08-02 (×2): 0.25 mg via RESPIRATORY_TRACT
  Filled 2019-08-01 (×2): qty 2

## 2019-08-01 MED ORDER — ARFORMOTEROL TARTRATE 15 MCG/2ML IN NEBU
15.0000 ug | INHALATION_SOLUTION | Freq: Two times a day (BID) | RESPIRATORY_TRACT | Status: DC
  Administered 2019-08-01 – 2019-08-02 (×2): 15 ug via RESPIRATORY_TRACT
  Filled 2019-08-01 (×2): qty 2

## 2019-08-01 MED ORDER — FUROSEMIDE 10 MG/ML IJ SOLN: 40.0000 mg | Freq: Two times a day (BID) | INTRAMUSCULAR | Status: DC

## 2019-08-01 MED ORDER — IPRATROPIUM-ALBUTEROL 0.5-2.5 (3) MG/3ML IN SOLN
3.0000 mL | Freq: Four times a day (QID) | RESPIRATORY_TRACT | Status: DC
  Administered 2019-08-01 – 2019-08-02 (×4): 3 mL via RESPIRATORY_TRACT
  Filled 2019-08-01 (×4): qty 3

## 2019-08-01 MED ORDER — FUROSEMIDE 10 MG/ML IJ SOLN
40.0000 mg | Freq: Once | INTRAMUSCULAR | Status: AC
  Administered 2019-08-01: 40 mg via INTRAVENOUS
  Filled 2019-08-01: qty 4

## 2019-08-01 MED ORDER — IPRATROPIUM BROMIDE 0.02 % IN SOLN: 0.5000 mg | Freq: Four times a day (QID) | RESPIRATORY_TRACT | Status: DC

## 2019-08-01 NOTE — Discharge Summary (Signed)
Physician Discharge Summary  Larry Lam [id number] DOB: Mar 10, 1971 DOA: 07/31/2019  PCP: [name], MD  Admit date: 07/31/2019  Discharge date: 08/01/2019  Admitted From:Home  Disposition:  Home  Recommendations for Outpatient Follow-up:  1. Follow up with PCP in 1-2 weeks and repeat CBC 2. Follow-up with hematologist/oncologist as scheduled 3. Continue home medications as prior  Home Health: None  Equipment/Devices: None, has home nasal cannula oxygen  Discharge Condition: Stable  CODE STATUS: Full  Diet recommendation: Heart Healthy/carb modified  Brief/Interim Summary: Per HPI: Larry Lam a 48 y.o.malewith medical history significant ofCOPD, asthma, hypertension, diabetes, nicotine dependence who presented to the ER with increased weakness.Patient reports having generalized weakness for the past few weeks, reports fatigue, has had episodes of dizziness and lightheadedness with exertion. Patient reported he saw his pulmonologist as outpatient who gave him a 10-day course of Augmentin for presumptive respiratory infection. He reports he had marginal improvement for a couple of days but then his symptoms persisted. No fever, chills, diaphoresis, seizure episodes, focal deficits, abdominal pain, diarrhea, chest pain, cough. Does have chronic shortness of breath. Is on home oxygen and came in tonight with his oxygen saturation dropped after he took off his oxygen.  12/5: Patient was admitted with acute on chronic anemia related to his recent diagnosis of CLL.  He was initially prescribed 2 units of PRBCs and states that he started to feel much better overall and reports no further weakness or fatigue.  I have added 1/3 unit as well as some platelets and will plan to follow hemoglobin and hematocrit after transfusion as well as repeat CBC in a.m.  If he continues to remain stable in a.m. and has ongoing improvement, will plan for discharge at that  time.  His Covid testing is currently pending.  12/6: Patient is symptomatically improved and his hemoglobin had increased to 7.4 after 3 unit PRBC transfusion on 12/5.  Repeat H&H this morning demonstrates hemoglobin of 7.1.  Transfused an additional unit of PRBC today with hemoglobin at 8.  He has had no other overt bleeding or concerns and is ready for discharge.  He is to follow-up with his PCP to have repeat CBC in 1 week to ensure stability.  No other issues or concerns noted throughout the course of this admission.  His Covid testing had returned negative.  Discharge Diagnoses:  Active Problems:   Seizures ([place])   Hypertension   Leucocytosis   DM (diabetes mellitus) ([place])   Chronic obstructive pulmonary disease (HCC)   Tobacco use   Cervical lymphadenopathy   CML (chronic myelocytic leukemia) ([place])  Principal discharge diagnosis: Symptomatic acute on chronic anemia related to recent diagnosis of CLL.  Discharge Instructions  Discharge Instructions    Diet - low sodium heart healthy   Complete by: As directed    Increase activity slowly   Complete by: As directed      Allergies as of 08/01/2019      Reactions   Amlodipine Besylate-valsartan Other (See Comments)   Lowered potassium   Hydrochlorothiazide Other (See Comments)   Lowered potassium      Medication List    STOP taking these medications   amLODipine 10 MG tablet Commonly known as: NORVASC   metoprolol tartrate 25 MG tablet Commonly known as: LOPRESSOR   predniSONE 10 MG tablet Commonly known as: DELTASONE     TAKE these medications   albuterol 108 (90 Base) MCG/ACT inhaler Commonly known as: VENTOLIN HFA Inhale 2 puffs  into the lungs every 6 (six) hours as needed for wheezing or shortness of breath.   ASPIRIN 81 PO Take 81 mg by mouth.   escitalopram 20 MG tablet Commonly known as: LEXAPRO Take 20 mg by mouth daily.   Fluticasone-Salmeterol 250-50 MCG/DOSE Aepb Commonly known as:  ADVAIR Inhale into the lungs.   glipiZIDE 5 MG tablet Commonly known as: GLUCOTROL Take 1 tablet (5 mg total) by mouth 2 (two) times daily.   Incruse Ellipta 62.5 MCG/INH Aepb Generic drug: umeclidinium bromide Inhale into the lungs.   ipratropium-albuterol 0.5-2.5 (3) MG/3ML Soln Commonly known as: DUONEB Take 3 mLs by nebulization every 6 (six) hours as needed. J44.9 and J44.1   Lipitor 10 MG tablet Generic drug: atorvastatin Take by mouth.   lisinopril 20 MG tablet Commonly known as: ZESTRIL Take 1 tablet (20 mg total) by mouth daily.   metFORMIN 500 MG tablet Commonly known as: GLUCOPHAGE Take 1 tablet (500 mg total) by mouth 2 (two) times daily with a meal. What changed: how much to take      Follow-up Information    Aycock, Ngwe A, MD Follow up in 1 week(s).   Specialty: Family Medicine Contact information: [address]  [place] [address] [phone]          Allergies  Allergen Reactions  . Amlodipine Besylate-Valsartan Other (See Comments)    Lowered potassium  . Hydrochlorothiazide Other (See Comments)    Lowered potassium    Consultations:  None   Procedures/Studies: Dg Chest 2 View  Result Date: 07/31/2019 CLINICAL DATA:  Shortness of breath. EXAM: CHEST - 2 VIEW COMPARISON:  April 27, 2017 FINDINGS: Cardiomediastinal silhouette is stable. No pneumothorax. No nodules or masses. Increased lung markings bilaterally with interstitial components. There is a small left pleural effusion. IMPRESSION: 1. Increase lung markings centrally with interstitial components suggestive of atypical infection/pneumonia. Bronchitis is a possibility. There is a small associated left effusion. Electronically Signed   By: Dorise Bullion III M.D   On: 07/31/2019 02:34      Discharge Exam: Vitals:   08/01/19 1313 08/01/19 1445  BP: (!) 123/51 (!) 120/97  Pulse: 93 90  Resp: 18 16  Temp: 98.7 F (37.1 C) 97.9 F (36.6 C)  SpO2: 96% 97%    Vitals:   08/01/19 1046 08/01/19 1117 08/01/19 1313 08/01/19 1445  BP: (!) 124/57 135/69 (!) 123/51 (!) 120/97  Pulse: (!) 102 96 93 90  Resp: 18 18 18 16   Temp: 98.4 F (36.9 C) 98.1 F (36.7 C) 98.7 F (37.1 C) 97.9 F (36.6 C)  TempSrc: Oral Oral Oral Oral  SpO2: 94% 96% 96% 97%  Weight:      Height:        General: Pt is alert, awake, not in acute distress Cardiovascular: RRR, S1/S2 +, no rubs, no gallops Respiratory: CTA bilaterally, no wheezing, no rhonchi, on 2 L nasal cannula oxygen Abdominal: Soft, NT, ND, bowel sounds + Extremities: no edema, no cyanosis    The results of significant diagnostics from this hospitalization (including imaging, microbiology, ancillary and laboratory) are listed below for reference.     Microbiology: Recent Results (from the past 240 hour(s))  SARS CORONAVIRUS 2 (TAT 6-24 HRS) Nasopharyngeal Per Rectum     Status: None   Collection Time: 07/31/19  3:20 AM   Specimen: Per Rectum; Nasopharyngeal  Result Value Ref Range Status   SARS Coronavirus 2 NEGATIVE NEGATIVE Final    Comment: (NOTE) SARS-CoV-2 target nucleic acids  are NOT DETECTED. The SARS-CoV-2 RNA is generally detectable in upper and lower respiratory specimens during the acute phase of infection. Negative results do not preclude SARS-CoV-2 infection, do not rule out co-infections with other pathogens, and should not be used as the sole basis for treatment or other patient management decisions. Negative results must be combined with clinical observations, patient history, and epidemiological information. The expected result is Negative. Fact Sheet for Patients: SugarRoll.be Fact Sheet for Healthcare Providers: https://www.woods-mathews.com/ This test is not yet approved or cleared by the [place] FDA and  has been authorized for detection and/or diagnosis of SARS-CoV-2 by FDA under an Emergency Use Authorization (EUA). This  EUA will remain  in effect (meaning this test can be used) for the duration of the COVID-19 declaration under Section 56 4(b)(1) of the Act, 21 U.S.C. section 360bbb-3(b)(1), unless the authorization is terminated or revoked sooner. Performed at [facility], [place] [address], [place], [place] [address]      Labs: BNP (last 3 results) No results for input(s): BNP in the last 8760 hours. Basic Metabolic Panel: Recent Labs  Lab 07/31/19 0152 07/31/19 1637  NA 137 136  K 4.2 4.3  CL 99 97*  CO2 31 30  GLUCOSE 192* 151*  BUN 23* 28*  CREATININE 0.80 0.83  CALCIUM 8.4* 8.6*   Liver Function Tests: Recent Labs  Lab 07/31/19 0152  AST 9*  ALT 9  ALKPHOS 87  BILITOT 0.4  PROT 7.0  ALBUMIN 3.3*   No results for input(s): LIPASE, AMYLASE in the last 168 hours. No results for input(s): AMMONIA in the last 168 hours. CBC: Recent Labs  Lab 07/31/19 0152 07/31/19 1637 08/01/19 0556 08/01/19 1403  WBC 17.7* 23.3* 21.1*  --   NEUTROABS 0.5*  --   --   --   HGB 4.2* 7.4* 7.1* 8.0*  HCT 14.2* 24.1* 23.4* 26.1*  MCV 105.2* 94.9 95.1  --   PLT 37* 37* 40*  --    Cardiac Enzymes: No results for input(s): CKTOTAL, CKMB, CKMBINDEX, TROPONINI in the last 168 hours. BNP: Invalid input(s): POCBNP CBG: Recent Labs  Lab 07/31/19 1057 07/31/19 1617 07/31/19 2106 08/01/19 0729 08/01/19 1111  GLUCAP 148* 255* 109* 107* 244*   D-Dimer No results for input(s): DDIMER in the last 72 hours. Hgb A1c Recent Labs    07/31/19 0152  HGBA1C 7.7*   Lipid Profile No results for input(s): CHOL, HDL, LDLCALC, TRIG, CHOLHDL, LDLDIRECT in the last 72 hours. Thyroid function studies No results for input(s): TSH, T4TOTAL, T3FREE, THYROIDAB in the last 72 hours.  Invalid input(s): FREET3 Anemia work up Recent Labs    07/31/19 0152  VITAMINB12 144*  FOLATE 8.7  FERRITIN 294  TIBC 402  IRON 125  RETICCTPCT 3.1   Urinalysis    Component Value Date/Time   COLORURINE  YELLOW 07/31/2019 0202   APPEARANCEUR CLEAR 07/31/2019 0202   APPEARANCEUR Clear 07/05/2012 0103   LABSPEC 1.020 07/31/2019 0202   LABSPEC 1.033 07/05/2012 0103   PHURINE 5.0 07/31/2019 0202   GLUCOSEU NEGATIVE 07/31/2019 0202   GLUCOSEU >=500 07/05/2012 0103   HGBUR NEGATIVE 07/31/2019 0202   BILIRUBINUR NEGATIVE 07/31/2019 0202   BILIRUBINUR Negative 07/05/2012 0103   KETONESUR NEGATIVE 07/31/2019 0202   PROTEINUR NEGATIVE 07/31/2019 0202   NITRITE NEGATIVE 07/31/2019 0202   LEUKOCYTESUR NEGATIVE 07/31/2019 0202   LEUKOCYTESUR Negative 07/05/2012 0103   Sepsis Labs Invalid input(s): PROCALCITONIN,  WBC,  LACTICIDVEN Microbiology Recent Results (from the past  240 hour(s))  SARS CORONAVIRUS 2 (TAT 6-24 HRS) Nasopharyngeal Per Rectum     Status: None   Collection Time: 07/31/19  3:20 AM   Specimen: Per Rectum; Nasopharyngeal  Result Value Ref Range Status   SARS Coronavirus 2 NEGATIVE NEGATIVE Final    Comment: (NOTE) SARS-CoV-2 target nucleic acids are NOT DETECTED. The SARS-CoV-2 RNA is generally detectable in upper and lower respiratory specimens during the acute phase of infection. Negative results do not preclude SARS-CoV-2 infection, do not rule out co-infections with other pathogens, and should not be used as the sole basis for treatment or other patient management decisions. Negative results must be combined with clinical observations, patient history, and epidemiological information. The expected result is Negative. Fact Sheet for Patients: SugarRoll.be Fact Sheet for Healthcare Providers: https://www.woods-mathews.com/ This test is not yet approved or cleared by the [place] FDA and  has been authorized for detection and/or diagnosis of SARS-CoV-2 by FDA under an Emergency Use Authorization (EUA). This EUA will remain  in effect (meaning this test can be used) for the duration of the COVID-19 declaration under Section  56 4(b)(1) of the Act, 21 U.S.C. section 360bbb-3(b)(1), unless the authorization is terminated or revoked sooner. Performed at [facility], [place] [address], [place], [place] [address]      Time coordinating discharge: 35 minutes  SIGNED:   Rodena Goldmann, DO [facility] 08/01/2019, 3:04 PM  If 7PM-7AM, please contact night-coverage [email or web address]

## 2019-08-01 NOTE — Progress Notes (Signed)
Patient Saturations on 2 L/min at rest = 93%   Patient Saturations on 2 Liters of oxygen while Ambulating = 82%

## 2019-08-02 ENCOUNTER — Inpatient Hospital Stay (HOSPITAL_BASED_OUTPATIENT_CLINIC_OR_DEPARTMENT_OTHER): Payer: Medicare Other | Admitting: Oncology

## 2019-08-02 ENCOUNTER — Other Ambulatory Visit: Payer: Self-pay

## 2019-08-02 ENCOUNTER — Telehealth: Payer: Self-pay | Admitting: Pharmacy Technician

## 2019-08-02 ENCOUNTER — Encounter: Payer: Self-pay | Admitting: Oncology

## 2019-08-02 ENCOUNTER — Emergency Department (HOSPITAL_COMMUNITY)
Admission: EM | Admit: 2019-08-02 | Discharge: 2019-08-02 | Disposition: A | Discharge status: Home / Self Care | Payer: Medicare Other | Source: Home / Self Care | Attending: Emergency Medicine | Admitting: Emergency Medicine

## 2019-08-02 ENCOUNTER — Telehealth: Payer: Self-pay | Admitting: *Deleted

## 2019-08-02 ENCOUNTER — Telehealth: Payer: Self-pay | Admitting: Pharmacist

## 2019-08-02 ENCOUNTER — Emergency Department (HOSPITAL_COMMUNITY): Payer: Medicare Other

## 2019-08-02 ENCOUNTER — Encounter (HOSPITAL_COMMUNITY): Payer: Self-pay

## 2019-08-02 ENCOUNTER — Inpatient Hospital Stay: Payer: Medicare Other

## 2019-08-02 DIAGNOSIS — R591 Generalized enlarged lymph nodes: Secondary | ICD-10-CM | POA: Diagnosis not present

## 2019-08-02 DIAGNOSIS — J449 Chronic obstructive pulmonary disease, unspecified: Secondary | ICD-10-CM | POA: Insufficient documentation

## 2019-08-02 DIAGNOSIS — E538 Deficiency of other specified B group vitamins: Secondary | ICD-10-CM

## 2019-08-02 DIAGNOSIS — R590 Localized enlarged lymph nodes: Secondary | ICD-10-CM | POA: Diagnosis not present

## 2019-08-02 DIAGNOSIS — R0602 Shortness of breath: Secondary | ICD-10-CM | POA: Insufficient documentation

## 2019-08-02 DIAGNOSIS — E119 Type 2 diabetes mellitus without complications: Secondary | ICD-10-CM | POA: Insufficient documentation

## 2019-08-02 DIAGNOSIS — Z87891 Personal history of nicotine dependence: Secondary | ICD-10-CM | POA: Insufficient documentation

## 2019-08-02 DIAGNOSIS — I10 Essential (primary) hypertension: Secondary | ICD-10-CM | POA: Insufficient documentation

## 2019-08-02 DIAGNOSIS — D649 Anemia, unspecified: Secondary | ICD-10-CM

## 2019-08-02 DIAGNOSIS — Z7982 Long term (current) use of aspirin: Secondary | ICD-10-CM | POA: Insufficient documentation

## 2019-08-02 DIAGNOSIS — Z7189 Other specified counseling: Secondary | ICD-10-CM | POA: Diagnosis not present

## 2019-08-02 DIAGNOSIS — Z7984 Long term (current) use of oral hypoglycemic drugs: Secondary | ICD-10-CM | POA: Insufficient documentation

## 2019-08-02 DIAGNOSIS — R161 Splenomegaly, not elsewhere classified: Secondary | ICD-10-CM | POA: Diagnosis not present

## 2019-08-02 DIAGNOSIS — C9210 Chronic myeloid leukemia, BCR/ABL-positive, not having achieved remission: Secondary | ICD-10-CM | POA: Insufficient documentation

## 2019-08-02 DIAGNOSIS — C9110 Chronic lymphocytic leukemia of B-cell type not having achieved remission: Secondary | ICD-10-CM

## 2019-08-02 DIAGNOSIS — Z79899 Other long term (current) drug therapy: Secondary | ICD-10-CM | POA: Insufficient documentation

## 2019-08-02 DIAGNOSIS — D696 Thrombocytopenia, unspecified: Secondary | ICD-10-CM

## 2019-08-02 LAB — BPAM RBC
Blood Product Expiration Date: 202012292359
Blood Product Expiration Date: 202012292359
Blood Product Expiration Date: 202101052359
Blood Product Expiration Date: 202101052359
ISSUE DATE / TIME: 202012050325
ISSUE DATE / TIME: 202012050618
ISSUE DATE / TIME: 202012051029
ISSUE DATE / TIME: 202012061054
Unit Type and Rh: 5100
Unit Type and Rh: 5100
Unit Type and Rh: 5100
Unit Type and Rh: 5100

## 2019-08-02 LAB — TYPE AND SCREEN
ABO/RH(D): O POS
Antibody Screen: NEGATIVE
Unit division: 0
Unit division: 0
Unit division: 0
Unit division: 0

## 2019-08-02 LAB — BASIC METABOLIC PANEL
Anion gap: 10 (ref 5–15)
BUN: 26 mg/dL — ABNORMAL HIGH (ref 6–20)
CO2: 33 mmol/L — ABNORMAL HIGH (ref 22–32)
Calcium: 9.1 mg/dL (ref 8.9–10.3)
Chloride: 94 mmol/L — ABNORMAL LOW (ref 98–111)
Creatinine, Ser: 0.77 mg/dL (ref 0.61–1.24)
GFR calc Af Amer: >60 mL/min (ref >60)
GFR calc non Af Amer: >60 mL/min (ref >60)
Glucose, Bld: 156 mg/dL — ABNORMAL HIGH (ref 70–99)
Potassium: 4.5 mmol/L (ref 3.5–5.1)
Sodium: 137 mmol/L (ref 135–145)

## 2019-08-02 LAB — PREPARE PLATELET PHERESIS: Unit division: 0

## 2019-08-02 LAB — BPAM PLATELET PHERESIS
Blood Product Expiration Date: 202012082359
ISSUE DATE / TIME: 202012051302
Unit Type and Rh: 5100

## 2019-08-02 LAB — GLUCOSE, CAPILLARY
Glucose-Capillary: 154 mg/dL — ABNORMAL HIGH (ref 70–99)
Glucose-Capillary: 135 mg/dL — ABNORMAL HIGH (ref 70–99)

## 2019-08-02 LAB — CBC
HCT: 26.2 % — ABNORMAL LOW (ref 39.0–52.0)
Hemoglobin: 8.1 g/dL — ABNORMAL LOW (ref 13.0–17.0)
MCH: 29.8 pg (ref 26.0–34.0)
MCHC: 30.9 g/dL (ref 30.0–36.0)
MCV: 96.3 fL (ref 80.0–100.0)
Platelets: 37 10*3/uL — ABNORMAL LOW (ref 150–400)
RBC: 2.72 MIL/uL — ABNORMAL LOW (ref 4.22–5.81)
RDW: 18.5 % — ABNORMAL HIGH (ref 11.5–15.5)
WBC: 21.3 10*3/uL — ABNORMAL HIGH (ref 4.0–10.5)
nRBC: 0.3 % — ABNORMAL HIGH (ref 0.0–0.2)

## 2019-08-02 LAB — PROCALCITONIN: Procalcitonin: <0.1 ng/mL

## 2019-08-02 MED ORDER — VITAMIN B-12 1000 MCG PO TABS: 1000.0000 ug | ORAL_TABLET | Freq: Every day | ORAL | 0 refills | Status: AC

## 2019-08-02 MED ORDER — AZITHROMYCIN 250 MG PO TABS
500.0000 mg | ORAL_TABLET | Freq: Once | ORAL | Status: AC
  Administered 2019-08-02: 500 mg via ORAL
  Filled 2019-08-02: qty 2

## 2019-08-02 MED ORDER — AZITHROMYCIN 250 MG PO TABS: 250.0000 mg | ORAL_TABLET | Freq: Every day | ORAL | 0 refills | Status: DC

## 2019-08-02 NOTE — TOC Transition Note (Signed)
Transition of Care [facility]) - CM/SW Discharge Note   Patient Details  Name: THADEUS LUNDHOLM MRN: [id number] Date of Birth: 05/17/1971  [facility]) CM/SW Contact:  Connor Meacham, Chauncey Reading, RN Phone Number: 08/02/2019, 9:57 AM   Clinical Narrative:   Patient discharging home today. CM consulted for assistive devices / medication assistance. Discussed with patient. He reports he is independent, has oxygen, nebulizer, pulse oximeter, CBG machine at home pta. No needed DME.  Patient has Medicare and Medicaid, able to afford medications.      Final next level of care: Home/Self Care Barriers to Discharge: Barriers Resolved       Discharge Plan and Services   Discharge Planning Services: CM Consult                                 Social Determinants of Health (SDOH) Interventions     Readmission Risk Interventions No flowsheet data found.

## 2019-08-02 NOTE — ED Provider Notes (Signed)
[facility] EMERGENCY DEPARTMENT Provider Note   CSN: [id number] Arrival date & time: 08/02/19  2122     History   Chief Complaint Chief Complaint  Patient presents with  . Shortness of Breath    HPI Larry Lam is a 48 y.o. male.     HPI  This patient is a very pleasant 48 year old male with a known history of asthma and COPD on 2 L of oxygen by nasal cannula chronically.  He is also known to have diabetes hypertension and a history of tobacco use.  He was recently diagnosed with chronic myelocytic leukemia but has not started chemotherapy yet.  He was admitted to the hospital on December 5 where he stayed for approximately 36 hours after being found to be severely anemic with a hemoglobin of 4.2, his fecal occult blood test was negative, his white blood cell count was 17,000 and his platelets were 37.  He ultimately was diagnosed with leukemia, given a blood transfusion times multiple and was discharged this morning.  He states he was feeling great at discharge, his repeat CBC at 430 this morning showed a hemoglobin of 8.1 up 4 units from his admission.  He states he was having no shortness of breath however when he was on the phone with his family member just prior to arrival he developed acute onset of shortness of breath, he got off the phone, took a couple of breathing treatments off his bronchodilator and ultimately felt better, he states that he now feels back to normal.  He has no chest heaviness, no chest pain, no pain anywhere, no swelling of the legs, no history of heart disease.  At this time the patient states he feels fine, back to his normal self.  Past Medical History:  Diagnosis Date  . Asthma   . COPD (chronic obstructive pulmonary disease) ([place])   . Diabetes mellitus without complication ([place])   . Hypertension   . Seizures ([place])    3-4 years ago. Single episode.  . Tobacco use     Patient Active Problem List   Diagnosis Date Noted  . CML (chronic  myelocytic leukemia) ([place]) 07/31/2019  . Lymphadenopathy 05/06/2019  . Splenomegaly 05/06/2019  . Lung nodules 05/06/2019  . CLL (chronic lymphocytic leukemia) ([place]) 04/21/2019  . Cervical lymphadenopathy 04/21/2019  . Shortness of breath 04/21/2019  . Goals of care, counseling/discussion 04/21/2019  . Chest pain   . Hypophosphatemia 04/28/2017  . Acute respiratory failure with hypoxia ([place]) 04/28/2017  . Uncontrolled type 2 diabetes mellitus with hyperglycemia, without long-term current use of insulin ([place]) 04/28/2017  . COPD exacerbation ([place]) 04/27/2017  . Tobacco use 04/27/2017  . Seizures ([place]) 01/19/2015  . Hypertension 01/19/2015  . Hypokalemia 01/19/2015  . Leucocytosis 01/19/2015  . DM (diabetes mellitus) ([place]) 01/19/2015  . Chronic obstructive pulmonary disease ([place]) 01/19/2015    History reviewed. No pertinent surgical history.      Home Medications    Prior to Admission medications   Medication Sig Start Date End Date Taking? Authorizing Provider  albuterol (PROVENTIL HFA;VENTOLIN HFA) 108 (90 Base) MCG/ACT inhaler Inhale 2 puffs into the lungs every 6 (six) hours as needed for wheezing or shortness of breath. 04/29/17   Orson Eva, MD  ASPIRIN 81 PO Take 81 mg by mouth.    [provider]  atorvastatin (LIPITOR) 10 MG tablet Take by mouth.    [provider]  azithromycin (ZITHROMAX Z-PAK) 250 MG tablet Take 1 tablet (250 mg total) by mouth  daily. 500mg  PO day 1, then 250mg  PO days 205 08/02/19   Noemi Chapel, MD  escitalopram (LEXAPRO) 20 MG tablet Take 20 mg by mouth daily. 05/11/19   [provider]  Fluticasone-Salmeterol (ADVAIR) 250-50 MCG/DOSE AEPB Inhale into the lungs. 04/06/19   [provider]  glipiZIDE (GLUCOTROL) 5 MG tablet Take 1 tablet (5 mg total) by mouth 2 (two) times daily. 04/29/17 05/04/19  Orson Eva, MD  ipratropium-albuterol (DUONEB) 0.5-2.5 (3) MG/3ML SOLN Take 3 mLs by nebulization every 6 (six) hours as  needed. J44.9 and J44.1 04/29/17   Tat, Shanon Brow, MD  lisinopril (PRINIVIL,ZESTRIL) 20 MG tablet Take 1 tablet (20 mg total) by mouth daily. 04/30/17   Orson Eva, MD  metFORMIN (GLUCOPHAGE) 500 MG tablet Take 1 tablet (500 mg total) by mouth 2 (two) times daily with a meal. Patient taking differently: Take 1,000 mg by mouth 2 (two) times daily with a meal.  04/29/17   Tat, Shanon Brow, MD  umeclidinium bromide (INCRUSE ELLIPTA) 62.5 MCG/INH AEPB Inhale into the lungs. 04/06/19   [provider]  vitamin B-12 (CYANOCOBALAMIN) 1000 MCG tablet Take 1 tablet (1,000 mcg total) by mouth daily. 08/02/19   Earlie Server, MD    Family History Family History  Problem Relation Age of Onset  . Lung cancer Maternal Grandfather     Social History Social History   Tobacco Use  . Smoking status: Former Smoker    Packs/day: 1.00    Years: 33.00    Pack years: 33.00    Types: Cigarettes    Quit date: 03/23/2019    Years since quitting: 0.3  . Smokeless tobacco: Former Network engineer Use Topics  . Alcohol use: Yes    Alcohol/week: 0.0 standard drinks    Comment: rare  . Drug use: No    Comment: smokes pot     Allergies   Amlodipine besylate-valsartan and Hydrochlorothiazide   Review of Systems Review of Systems  All other systems reviewed and are negative.    Physical Exam Updated Vital Signs BP (!) 168/82 (BP Location: Right Arm)   Pulse (!) 111   Temp 98.2 F (36.8 C) (Oral)   Resp 18   Ht 1.778 m (5\' 10" )   Wt 103.4 kg   SpO2 94%   BMI 32.71 kg/m   Physical Exam Vitals signs and nursing note reviewed.  Constitutional:      General: He is not in acute distress.    Appearance: He is well-developed.  HENT:     Head: Normocephalic and atraumatic.     Mouth/Throat:     Pharynx: No oropharyngeal exudate.  Eyes:     General: No scleral icterus.       Right eye: No discharge.        Left eye: No discharge.     Conjunctiva/sclera: Conjunctivae normal.     Pupils: Pupils are equal,  round, and reactive to light.  Neck:     Musculoskeletal: Normal range of motion and neck supple.     Thyroid: No thyromegaly.     Vascular: No JVD.  Cardiovascular:     Rate and Rhythm: Normal rate and regular rhythm.     Heart sounds: Normal heart sounds. No murmur. No friction rub. No gallop.   Pulmonary:     Effort: Pulmonary effort is normal. No respiratory distress.     Breath sounds: Normal breath sounds. No wheezing or rales.  Abdominal:     General: Bowel sounds are normal. There is  no distension.     Palpations: Abdomen is soft. There is no mass.     Tenderness: There is no abdominal tenderness.  Musculoskeletal: Normal range of motion.        General: No tenderness.  Lymphadenopathy:     Cervical: No cervical adenopathy.  Skin:    General: Skin is warm and dry.     Findings: No erythema or rash.  Neurological:     Mental Status: He is alert.     Coordination: Coordination normal.  Psychiatric:        Behavior: Behavior normal.      ED Treatments / Results  Labs (all labs ordered are listed, but only abnormal results are displayed) Labs Reviewed - No data to display  EKG ED ECG REPORT  I personally interpreted this EKG   Date: 08/04/2019   Rate: 107  Rhythm: sinus tachycardia  QRS Axis: right  Intervals: normal  ST/T Wave abnormalities: normal  Conduction Disutrbances:none  Narrative Interpretation:   Old EKG Reviewed: unchanged since 04/27/17   Radiology Dg Chest 2 View  Result Date: 08/02/2019 CLINICAL DATA:  Shortness of breath, recent discharge, CLL EXAM: CHEST - 2 VIEW COMPARISON:  Chest CT 04/30/2019, chest radiograph 08/01/2019 FINDINGS: Diffusely increased interstitial markings are again seen. No pneumothorax. More nodular masslike opacity seen in the left costophrenic sulcus possibly reflecting some prominent lymph nodes in the pericardial fat pad on comparison chest CT. Additional ovoid opacities are seen in the soft tissues of the axilla and base  of the neck. Bilateral effusions are noted. Cardiomediastinal contours are similar to prior with nodular densities in the hila likely reflecting adenopathy. No acute osseous abnormality. Degenerative changes in the shoulders. IMPRESSION: Nonspecific increased interstitial markings with bilateral effusions. Could reflect atypical infection or edema. Nodular opacity in the left costophrenic sulcus may reflect adenopathy within the pericardial fat. Additional nodular opacities in the hila and axilla likely reflecting further adenopathy in the setting of CLL. Electronically Signed   By: Lovena Le M.D.   On: 08/02/2019 22:13    Procedures Procedures (including critical care time)  Medications Ordered in ED Medications  azithromycin (ZITHROMAX) tablet 500 mg (500 mg Oral Given 08/02/19 2255)     Initial Impression / Assessment and Plan / ED Course  I have reviewed the triage vital signs and the nursing notes.  Pertinent labs & imaging results that were available during my care of the patient were reviewed by me and considered in my medical decision making (see chart for details).  Clinical Course as of Aug 04 947  Mon Aug 02, 2019  2235 I have personally viewed the patient's x-ray, 2 view PA and lateral views, there does not appear to be a focal infiltrate however he does have some increased interstitial markings, this was read as possible atypical pneumonia versus possible bilateral small effusions.  The patient does not have a fever, he is not coughing, he is not short of breath at this time, I do not think his acute onset of shortness of breath was related to what we are seeing on the x-ray.  Will trial a course of Zithromax given the patient's leukocytosis (could just be related to underlying CML), the patient is agreeable to the plan   [BM]    Clinical Course User Index [BM] Noemi Chapel, MD       This patient has what appears to be a transient episode of shortness of breath, he is not  hypoxic febrile or ill-appearing and  is asymptomatic at this time.  He is on his baseline 2 L and has an oxygen of 98%.  He has no abnormal heart sounds but is mildly tachycardic.  He has no edema, he is well-appearing, states that he has no symptoms whatsoever at this time.  Will obtain EKG and chest x-ray, anticipate discharge if negative.  Could have been a panic attack versus COPD flare however at this time he is back to his baseline.  I do not see any reason to check labs as he had them within the last 12 to 15 hours and had a correction of his significant anemia.  Final Clinical Impressions(s) / ED Diagnoses   Final diagnoses:  SOB (shortness of breath)    ED Discharge Orders         Ordered    azithromycin (ZITHROMAX Z-PAK) 250 MG tablet  Daily     08/02/19 2237           Noemi Chapel, MD 08/04/19 [phone]

## 2019-08-02 NOTE — Progress Notes (Signed)
HEMATOLOGY-ONCOLOGY TeleHEALTH VISIT PROGRESS NOTE  I connected with Larry Lam on 08/02/19 at  1:15 PM EST by video enabled telemedicine visit.  And verified that I am speaking with the correct person using two identifiers. I discussed the limitations, risks, security and privacy concerns of performing an evaluation and management service by telemedicine and the availability of in-person appointments. I also discussed with the patient that there may be a patient responsible charge related to this service. The patient expressed understanding and agreed to proceed.   Other persons participating in the visit and their role in the encounter:  Wife Larry Lam to set up virtual visit.  Patient's location: Home  Provider's location: office Chief Complaint: Follow-up for CLL   INTERVAL HISTORY Larry Lam is a 48 y.o. male who has above history reviewed by me today presents for follow up visit for management of CLL Problems and complaints are listed below:  Patient was recently admitted and was just released today. Patient presented to emergency room on 07/31/2019 due to generalized weakness for the past 1 months.  Also had episodes of dizziness and lightheaded Ms. with exertion.  Was seen by his pulmonologist and was given a 10-day course of Augmentin for presumptive respiratory infection.  Symptoms persisted which prompted patient to go to emergency room. Patient was found to have a hemoglobin of 4.2, status post multiple PRBC transfusions. Patient was also found to have low platelet counts of 37,000.  No acute bleeding. Patient was released on 08/02/2019 with a hemoglobin of 8.1 this morning. Patient also reports a history of unintentional weight loss.  He reports he currently weighs 225 pounds.  In August 2020, he weighed 239 pounds.  Appetite is fair.  Does not feel food taste well. He denies any black tarry stool.  Or blood in the stool.  His iron panel was done on 07/31/2019 which  showed ferritin level of 294.  Iron saturation 31.  TIBC 402  Review of Systems  Constitutional: Positive for appetite change, fatigue and unexpected weight change. Negative for chills and fever.  HENT:   Negative for hearing loss and voice change.   Eyes: Negative for eye problems and icterus.  Respiratory: Positive for shortness of breath. Negative for chest tightness and cough.   Cardiovascular: Negative for chest pain and leg swelling.  Gastrointestinal: Negative for abdominal distention and abdominal pain.  Endocrine: Negative for hot flashes.  Genitourinary: Negative for difficulty urinating, dysuria and frequency.   Musculoskeletal: Negative for arthralgias.  Skin: Negative for itching and rash.  Neurological: Negative for light-headedness and numbness.  Hematological: Negative for adenopathy. Does not bruise/bleed easily.  Psychiatric/Behavioral: Negative for confusion.    Past Medical History:  Diagnosis Date  . Asthma   . COPD (chronic obstructive pulmonary disease) ([place])   . Diabetes mellitus without complication ([place])   . Hypertension   . Seizures ([place])    3-4 years ago. Single episode.  . Tobacco use    No past surgical history on file.  Family History  Problem Relation Age of Onset  . Lung cancer Maternal Grandfather     Social History   Socioeconomic History  . Marital status: Married    Spouse name: Not on file  . Number of children: Not on file  . Years of education: Not on file  . Highest education level: Not on file  Occupational History  . Not on file  Social Needs  . Financial resource strain: Not on file  . Food insecurity  Worry: Not on file    Inability: Not on file  . Transportation needs    Medical: Not on file    Non-medical: Not on file  Tobacco Use  . Smoking status: Former Smoker    Packs/day: 1.00    Years: 33.00    Pack years: 33.00    Types: Cigarettes    Quit date: 03/23/2019    Years since quitting: 0.3  . Smokeless  tobacco: Former Network engineer and Sexual Activity  . Alcohol use: Yes    Alcohol/week: 0.0 standard drinks    Comment: rare  . Drug use: No    Comment: smokes pot  . Sexual activity: Not on file  Lifestyle  . Physical activity    Days per week: Not on file    Minutes per session: Not on file  . Stress: Not on file  Relationships  . Social Herbalist on phone: Not on file    Gets together: Not on file    Attends religious service: Not on file    Active member of club or organization: Not on file    Attends meetings of clubs or organizations: Not on file    Relationship status: Not on file  . Intimate partner violence    Fear of current or ex partner: Not on file    Emotionally abused: Not on file    Physically abused: Not on file    Forced sexual activity: Not on file  Other Topics Concern  . Not on file  Social History Narrative  . Not on file    No current facility-administered medications on file prior to visit.    Current Outpatient Medications on File Prior to Visit  Medication Sig Dispense Refill  . albuterol (PROVENTIL HFA;VENTOLIN HFA) 108 (90 Base) MCG/ACT inhaler Inhale 2 puffs into the lungs every 6 (six) hours as needed for wheezing or shortness of breath. 1 Inhaler 0  . ASPIRIN 81 PO Take 81 mg by mouth.    Marland Kitchen atorvastatin (LIPITOR) 10 MG tablet Take by mouth.    . escitalopram (LEXAPRO) 20 MG tablet Take 20 mg by mouth daily.    . Fluticasone-Salmeterol (ADVAIR) 250-50 MCG/DOSE AEPB Inhale into the lungs.    Marland Kitchen glipiZIDE (GLUCOTROL) 5 MG tablet Take 1 tablet (5 mg total) by mouth 2 (two) times daily. 60 tablet 0  . ipratropium-albuterol (DUONEB) 0.5-2.5 (3) MG/3ML SOLN Take 3 mLs by nebulization every 6 (six) hours as needed. J44.9 and J44.1 360 mL 0  . lisinopril (PRINIVIL,ZESTRIL) 20 MG tablet Take 1 tablet (20 mg total) by mouth daily. 30 tablet 0  . metFORMIN (GLUCOPHAGE) 500 MG tablet Take 1 tablet (500 mg total) by mouth 2 (two) times daily with  a meal. (Patient taking differently: Take 1,000 mg by mouth 2 (two) times daily with a meal. ) 60 tablet 0  . umeclidinium bromide (INCRUSE ELLIPTA) 62.5 MCG/INH AEPB Inhale into the lungs.      Allergies  Allergen Reactions  . Amlodipine Besylate-Valsartan Other (See Comments)    Lowered potassium  . Hydrochlorothiazide Other (See Comments)    Lowered potassium       Observations/Objective: There were no vitals filed for this visit. There is no height or weight on file to calculate BMI.  Physical Exam  Constitutional: He is oriented to person, place, and time. No distress.  Neurological: He is alert and oriented to person, place, and time.  Psychiatric: Mood normal.    CBC  Component Value Date/Time   WBC 21.3 (H) 08/02/2019 0432   RBC 2.72 (L) 08/02/2019 0432   HGB 8.1 (L) 08/02/2019 0432   HGB 15.7 07/07/2012 0359   HCT 26.2 (L) 08/02/2019 0432   HCT 44.5 07/07/2012 0359   PLT 37 (L) 08/02/2019 0432   PLT 187 07/07/2012 0359   MCV 96.3 08/02/2019 0432   MCV 87 07/07/2012 0359   MCH 29.8 08/02/2019 0432   MCHC 30.9 08/02/2019 0432   RDW 18.5 (H) 08/02/2019 0432   RDW 12.7 07/07/2012 0359   LYMPHSABS 16.9 (H) 07/31/2019 0152   LYMPHSABS 3.2 07/07/2012 0359   MONOABS 0.2 07/31/2019 0152   MONOABS 0.7 07/07/2012 0359   EOSABS 0.0 07/31/2019 0152   EOSABS 0.2 07/07/2012 0359   BASOSABS 0.0 07/31/2019 0152   BASOSABS 0.1 07/07/2012 0359    CMP     Component Value Date/Time   NA 137 08/02/2019 0432   NA 137 07/06/2012 0506   K 4.5 08/02/2019 0432   K 4.0 07/06/2012 0506   CL 94 (L) 08/02/2019 0432   CL 104 07/06/2012 0506   CO2 33 (H) 08/02/2019 0432   CO2 25 07/06/2012 0506   GLUCOSE 156 (H) 08/02/2019 0432   GLUCOSE 143 (H) 07/06/2012 0506   BUN 26 (H) 08/02/2019 0432   BUN 12 07/06/2012 0506   CREATININE 0.77 08/02/2019 0432   CREATININE 0.62 07/06/2012 0506   CALCIUM 9.1 08/02/2019 0432   CALCIUM 8.6 07/06/2012 0506   PROT 7.0 07/31/2019 0152    PROT 7.1 07/06/2012 0506   ALBUMIN 3.3 (L) 07/31/2019 0152   ALBUMIN 3.2 (L) 07/06/2012 0506   AST 9 (L) 07/31/2019 0152   AST 31 07/06/2012 0506   ALT 9 07/31/2019 0152   ALT 30 07/06/2012 0506   ALKPHOS 87 07/31/2019 0152   ALKPHOS 96 07/06/2012 0506   BILITOT 0.4 07/31/2019 0152   BILITOT 0.4 07/06/2012 0506   GFRNONAA >60 08/02/2019 0432   GFRNONAA >60 07/06/2012 0506   GFRAA >60 08/02/2019 0432   GFRAA >60 07/06/2012 0506     Assessment and Plan: 1. CLL (chronic lymphocytic leukemia) ([place])   2. Goals of care, counseling/discussion   3. Splenomegaly   4. Lymphadenopathy   5. Normocytic anemia   6. Thrombocytopenia ([place])   7. Vitamin B12 deficiency     Labs are reviewed and discussed with patient. Patient was asymptomatic 3 to 4 months ago.  He has developed significant constitutional symptoms during the past 1 months.  With acute drop off hemoglobin and platelet counts.  Concern for disease progression. Recommend patient to repeat blood work with CBC, hold tube, LDH, flow cytometry I will obtain PET scan evaluation.  Given patient's rapid disease progression, need to rule out Richter's transformation  Vitamin B12 deficiency, would recommend patient to start oral vitamin B12 supplementation.  Parenteral vitamin B12 injection intramuscularly can potentially increase chance of developing intramuscular hematoma given his hemoglobin is less than 50,000.  For disease progression of CLL, consider Venetoclax and obinutuzumab.  Were looking to patient his coverage.  Pending above work-up.   Follow Up Instructions: To be determined.   I discussed the assessment and treatment plan with the patient. The patient was provided an opportunity to ask questions and all were answered. The patient agreed with the plan and demonstrated an understanding of the instructions.  The patient was advised to call back or seek an in-person evaluation if the symptoms worsen or if the condition fails to  improve  as anticipated.    Earlie Server, MD 08/02/2019 10:47 PM

## 2019-08-02 NOTE — Progress Notes (Signed)
START ON PATHWAY REGIMEN - Lymphoma and CLL     Cycle 1: A cycle is 28 days:     Venetoclax      Obinutuzumab      Obinutuzumab      Obinutuzumab    Cycle 2: A cycle is 28 days:     Venetoclax      Venetoclax      Venetoclax      Venetoclax      Obinutuzumab    Cycles 3 through 6: A cycle is 28 days:     Venetoclax      Obinutuzumab    Cycles 7 through 12: A cycle is 28 days:     Venetoclax   **Always confirm dose/schedule in your pharmacy ordering system**  Patient Characteristics: Chronic Lymphocytic Leukemia (CLL), First Line, Treatment Indicated, 17p del(-)/Unknown and ATM Mutation Negative/Unknown and TP53 Mutation Negative/Unknown, Age < 69 and Fit, IGHV Mutation (+), Not a Candidate for Aggressive Chemoimmunotherapy Disease Type: Chronic Lymphocytic Leukemia (CLL) Disease Type: Not Applicable Disease Type: Not Applicable Line of Therapy: First Line RAI Stage: IV Treatment Indicated<= Treatment Indicated ATM Mutation Status: Negative 17p Deletion Status: Negative TP53 Mutation Status: Negative Patient Age: < 60 Patient Condition: Fit Patient IGHV Mutation Status: IGHV Mutation (+) Intent of Therapy: Non-Curative / Palliative Intent, Discussed with Patient

## 2019-08-02 NOTE — Progress Notes (Signed)
Patient seen and evaluated this morning.  He states that he "feels like 1 million bucks" and would like to be discharged today.  Please refer to discharge summary on 12/6 for full details regarding this hospitalization.  He was not able to leave on 12/6 as he was noted to have some significant hypoxemia as well as shortness of breath with ambulation.  He has ambulated today with no further issues or concerns after diuresis with IV Lasix.  Presumably he was volume overloaded after multiple blood products were administered and is now otherwise feeling better.  He needs to follow-up with his PCP and oncologist in the near future and have repeat CBC in the next 1 week.  Total care time: 20 minutes.

## 2019-08-02 NOTE — Telephone Encounter (Signed)
FYI:  Dr. Tasia Catchings, he is scheduled for a virtual visit with you at 1:15 today.

## 2019-08-02 NOTE — ED Triage Notes (Signed)
Pt presents to ED with complaints of SOB. Pt was discharged from hospital today. Pt states he was talking on the phone and felt like he lost all his air, increased his O2 to 3L and took his rescue inhaler and that helped. Pt states he received 5 units of blood while hospitalized. Pt states he had had a BM and had blood in it, but second BM didn't have blood. Pt denies cough.

## 2019-08-02 NOTE — Telephone Encounter (Signed)
Oral Oncology Pharmacist Encounter  Received new prescription for Venclexta (venetoclax) for the treatment of CLL in conjunction with obinutuzamab, planned duration until disease progression or unacceptable drug toxicity. Patient will start obinutuzamab then start venetoclax likely with cycle 2.   CMP from 07/31/2019 and CBC from 08/06/2019  assessed, no relevant lab abnormalities. Lab will need to be reassessed prior to the start of venetoclax and additional labs ordered for uric acid, phos, and mag. Patient will start venetoclax using a dose escalation.  Current medication list in Epic reviewed, no relevant DDIs with venetoclax identified at this time.  Prescription has been e-scribed to the [facility] for benefits analysis and approval.  Oral Oncology Clinic will continue to follow for insurance authorization, copayment issues, initial counseling and start date.  Darl Pikes, PharmD, BCPS, [facility] Hematology/Oncology Clinical Pharmacist [facility] [phone]  08/02/2019 4:07 PM

## 2019-08-02 NOTE — Telephone Encounter (Signed)
Tammie called reporting and requesting that patient get chemotherapy. He is in the hospital and his hgb and platelet count are very low. Please return her call [phone].  CBC with Differential Order: [id number] Status:  Final result  Visible to patient:  No (not released)  Next appt:  None (important suggestion)  Newer results are available. Click to view them now.   Ref Range & Units 2d ago  WBC 4.0 - 10.5 K/uL 17.7High    RBC 4.22 - 5.81 MIL/uL 1.35Low    Hemoglobin 13.0 - 17.0 g/dL 4.2Low Panic    Comment: This critical result has verified and been called to SAPPELT,J by Duncan Dull on 12 05 2020 at [place], and has been read back.   HCT 39.0 - 52.0 % 14.2Low    MCV 80.0 - 100.0 fL 105.2High    MCH 26.0 - 34.0 pg 31.1   MCHC 30.0 - 36.0 g/dL 29.6Low    RDW 11.5 - 15.5 % 20.0High    Platelets 150 - 400 K/uL 37Low    Comment: Immature Platelet Fraction may be  clinically indicated, consider  ordering this additional test  [id number]   nRBC 0.0 - 0.2 % 0.3High    Neutrophils Relative % % 3   Neutro Abs 1.7 - 7.7 K/uL 0.5Low    Lymphocytes Relative % 96   Lymphs Abs 0.7 - 4.0 K/uL 16.9High    Monocytes Relative % 1   Monocytes Absolute 0.1 - 1.0 K/uL 0.2   Eosinophils Relative % 0   Eosinophils Absolute 0.0 - 0.5 K/uL 0.0   Basophils Relative % 0   Basophils Absolute 0.0 - 0.1 K/uL 0.0   Immature Granulocytes % 0   Abs Immature Granulocytes 0.00 - 0.07 K/uL 0.06   Comment: Performed at [facility], [address] [place], [place] [address]  Resulting Agency  [facility] LAB      Specimen Collected: 07/31/19 01:52  Last Resulted: 07/31/19 02:27         AMCBC Order: [id number] Status:  Final result Visible to patient:  No (not released) Next appt:  None (important suggestion)  Newer results are available. Click to view them now.   Ref Range & Units 1d ago (08/01/19) 2d ago (07/31/19) 2d ago (07/31/19)  WBC 4.0 - 10.5 K/uL 21.1High   23.3High   17.7High    RBC 4.22 -  5.81 MIL/uL 2.46Low   2.54Low   1.35Low    Hemoglobin 13.0 - 17.0 g/dL 7.1Low   7.4Low  CM  4.2Low Panic  CM   HCT 39.0 - 52.0 % 23.4Low   24.1Low   14.2Low    MCV 80.0 - 100.0 fL 95.1  94.9  105.2High    MCH 26.0 - 34.0 pg 28.9  29.1  31.1   MCHC 30.0 - 36.0 g/dL 30.3  30.7  29.6Low    RDW 11.5 - 15.5 % 19.9High   20.0High   20.0High    Platelets 150 - 400 K/uL 40Low   37Low  CM  37Low  CM   Comment: REPEATED TO VERIFY  SPECIMEN CHECKED FOR CLOTS  Immature Platelet Fraction may be  clinically indicated, consider  ordering this additional test  [id number]  CONSISTENT WITH PREVIOUS RESULT   nRBC 0.0 - 0.2 % 0.3High   0.3High  CM  0.3High    Comment: Performed at [facility], [address], [place], [place] [address]  Neutrophils Relative %    3 R   Basophils Absolute  0.0 R   Immature Granulocytes    0 R   Abs Immature Granulocytes    0.06 R, CM   Neutro Abs    0.5Low  R   Lymphocytes Relative    96 R   Lymphs Abs    16.9High  R   Monocytes Relative    1 R   Monocytes Absolute    0.2 R   Eosinophils Relative    0 R   Eosinophils Absolute    0.0 R   Basophils Relative    0 R   Resulting Agency  [place] [facility] [place] [facility] [place] [facility]      Specimen Collected: 08/01/19 05:56 Last Resulted: 08/01/19 08:05

## 2019-08-02 NOTE — Progress Notes (Signed)
Nsg Discharge Note  Admit Date:  07/31/2019 Discharge date: 08/02/2019   [name] Campau to be D/C'd home per MD order.  AVS completed.  Copy for chart, and copy for patient signed, and dated. Patient/caregiver able to verbalize understanding.  Discharge Medication: Allergies as of 08/02/2019      Reactions   Amlodipine Besylate-valsartan Other (See Comments)   Lowered potassium   Hydrochlorothiazide Other (See Comments)   Lowered potassium      Medication List    STOP taking these medications   amLODipine 10 MG tablet Commonly known as: NORVASC   metoprolol tartrate 25 MG tablet Commonly known as: LOPRESSOR   predniSONE 10 MG tablet Commonly known as: DELTASONE     TAKE these medications   albuterol 108 (90 Base) MCG/ACT inhaler Commonly known as: VENTOLIN HFA Inhale 2 puffs into the lungs every 6 (six) hours as needed for wheezing or shortness of breath.   ASPIRIN 81 PO Take 81 mg by mouth.   escitalopram 20 MG tablet Commonly known as: LEXAPRO Take 20 mg by mouth daily.   Fluticasone-Salmeterol 250-50 MCG/DOSE Aepb Commonly known as: ADVAIR Inhale into the lungs.   glipiZIDE 5 MG tablet Commonly known as: GLUCOTROL Take 1 tablet (5 mg total) by mouth 2 (two) times daily.   Incruse Ellipta 62.5 MCG/INH Aepb Generic drug: umeclidinium bromide Inhale into the lungs.   ipratropium-albuterol 0.5-2.5 (3) MG/3ML Soln Commonly known as: DUONEB Take 3 mLs by nebulization every 6 (six) hours as needed. J44.9 and J44.1   Lipitor 10 MG tablet Generic drug: atorvastatin Take by mouth.   lisinopril 20 MG tablet Commonly known as: ZESTRIL Take 1 tablet (20 mg total) by mouth daily.   metFORMIN 500 MG tablet Commonly known as: GLUCOPHAGE Take 1 tablet (500 mg total) by mouth 2 (two) times daily with a meal. What changed: how much to take       Discharge Assessment: Vitals:   08/02/19 0451 08/02/19 0811  BP: 120/65   Pulse: 97   Resp: 18   Temp:  98.3 F (36.8 C)   SpO2: 97% 96%   Skin clean, dry and intact without evidence of skin break down, no evidence of skin tears noted. IV catheter discontinued intact. Site without signs and symptoms of complications - no redness or edema noted at insertion site, patient denies c/o pain - only slight tenderness at site.  Dressing with slight pressure applied.  D/c Instructions-Education: Discharge instructions given to patient/family with verbalized understanding. D/c education completed with patient/family including follow up instructions, medication list, d/c activities limitations if indicated, with other d/c instructions as indicated by MD - patient able to verbalize understanding, all questions fully answered. Patient instructed to return to ED, call 911, or call MD for any changes in condition.  Patient escorted via [place], and D/C home via private auto.  Carney Corners, RN 08/02/2019 11:20 AM

## 2019-08-02 NOTE — Telephone Encounter (Signed)
Patient is in the hospital currently.

## 2019-08-02 NOTE — Discharge Instructions (Signed)
Your x-ray shows a little bit of fluid in your lungs, this also could be related to a slight early pneumonia however it is very faint and at this time I would recommend taking the medicine called Zithromax which is an antibiotic that treats pneumonia.  Otherwise please come back to the hospital immediately for severe or worsening symptoms including increasing chest pain shortness of breath fevers nausea or vomiting.  See your doctor in 48 hours for recheck

## 2019-08-02 NOTE — Care Management Important Message (Signed)
Important Message  Patient Details  Name: Larry Lam MRN: [id number] Date of Birth: 1971/08/19   Medicare Important Message Given:  Yes     Tommy Medal 08/02/2019, 1:16 PM

## 2019-08-03 ENCOUNTER — Other Ambulatory Visit: Payer: Self-pay

## 2019-08-03 ENCOUNTER — Inpatient Hospital Stay: Payer: Medicare Other

## 2019-08-03 ENCOUNTER — Telehealth: Payer: Self-pay

## 2019-08-03 DIAGNOSIS — Z7982 Long term (current) use of aspirin: Secondary | ICD-10-CM | POA: Diagnosis not present

## 2019-08-03 DIAGNOSIS — E538 Deficiency of other specified B group vitamins: Secondary | ICD-10-CM

## 2019-08-03 DIAGNOSIS — D696 Thrombocytopenia, unspecified: Secondary | ICD-10-CM | POA: Diagnosis not present

## 2019-08-03 DIAGNOSIS — Z7189 Other specified counseling: Secondary | ICD-10-CM

## 2019-08-03 DIAGNOSIS — Z7984 Long term (current) use of oral hypoglycemic drugs: Secondary | ICD-10-CM | POA: Diagnosis not present

## 2019-08-03 DIAGNOSIS — Z87891 Personal history of nicotine dependence: Secondary | ICD-10-CM | POA: Diagnosis not present

## 2019-08-03 DIAGNOSIS — E119 Type 2 diabetes mellitus without complications: Secondary | ICD-10-CM | POA: Diagnosis not present

## 2019-08-03 DIAGNOSIS — Z801 Family history of malignant neoplasm of trachea, bronchus and lung: Secondary | ICD-10-CM | POA: Diagnosis not present

## 2019-08-03 DIAGNOSIS — R591 Generalized enlarged lymph nodes: Secondary | ICD-10-CM | POA: Diagnosis not present

## 2019-08-03 DIAGNOSIS — J449 Chronic obstructive pulmonary disease, unspecified: Secondary | ICD-10-CM | POA: Diagnosis not present

## 2019-08-03 DIAGNOSIS — C9110 Chronic lymphocytic leukemia of B-cell type not having achieved remission: Secondary | ICD-10-CM

## 2019-08-03 DIAGNOSIS — Z79899 Other long term (current) drug therapy: Secondary | ICD-10-CM | POA: Diagnosis not present

## 2019-08-03 DIAGNOSIS — Z7951 Long term (current) use of inhaled steroids: Secondary | ICD-10-CM | POA: Diagnosis not present

## 2019-08-03 DIAGNOSIS — R161 Splenomegaly, not elsewhere classified: Secondary | ICD-10-CM

## 2019-08-03 DIAGNOSIS — I10 Essential (primary) hypertension: Secondary | ICD-10-CM | POA: Diagnosis not present

## 2019-08-03 DIAGNOSIS — D649 Anemia, unspecified: Secondary | ICD-10-CM | POA: Diagnosis not present

## 2019-08-03 LAB — IMMATURE PLATELET FRACTION: Immature Platelet Fraction: 12.4 % — ABNORMAL HIGH (ref 1.2–8.6)

## 2019-08-03 LAB — CBC WITH DIFFERENTIAL/PLATELET
Abs Immature Granulocytes: 0.04 10*3/uL (ref 0.00–0.07)
Basophils Absolute: 0 10*3/uL (ref 0.0–0.1)
Basophils Relative: 0 %
Eosinophils Absolute: 0 10*3/uL (ref 0.0–0.5)
Eosinophils Relative: 0 %
HCT: 24.2 % — ABNORMAL LOW (ref 39.0–52.0)
Hemoglobin: 7.4 g/dL — ABNORMAL LOW (ref 13.0–17.0)
Immature Granulocytes: 0 %
Lymphocytes Relative: 96 %
Lymphs Abs: 16.2 10*3/uL — ABNORMAL HIGH (ref 0.7–4.0)
MCH: 28.8 pg (ref 26.0–34.0)
MCHC: 30.6 g/dL (ref 30.0–36.0)
MCV: 94.2 fL (ref 80.0–100.0)
Monocytes Absolute: 0.2 10*3/uL (ref 0.1–1.0)
Monocytes Relative: 1 %
Neutro Abs: 0.6 10*3/uL — ABNORMAL LOW (ref 1.7–7.7)
Neutrophils Relative %: 3 %
Platelets: 32 10*3/uL — ABNORMAL LOW (ref 150–400)
RBC: 2.57 MIL/uL — ABNORMAL LOW (ref 4.22–5.81)
RDW: 17.5 % — ABNORMAL HIGH (ref 11.5–15.5)
Smear Review: DECREASED
WBC: 17 10*3/uL — ABNORMAL HIGH (ref 4.0–10.5)
nRBC: 0.4 % — ABNORMAL HIGH (ref 0.0–0.2)

## 2019-08-03 LAB — HEPATITIS PANEL, ACUTE
HCV Ab: NONREACTIVE
Hep A IgM: NONREACTIVE
Hep B C IgM: NONREACTIVE
Hepatitis B Surface Ag: NONREACTIVE

## 2019-08-03 LAB — SAMPLE TO BLOOD BANK

## 2019-08-03 LAB — RETIC PANEL
Immature Retic Fract: 15.4 % (ref 2.3–15.9)
RBC.: 2.57 MIL/uL — ABNORMAL LOW (ref 4.22–5.81)
Retic Count, Absolute: 38.3 10*3/uL (ref 19.0–186.0)
Retic Ct Pct: 1.5 % (ref 0.4–3.1)
Reticulocyte Hemoglobin: 30.8 pg (ref >27.9)

## 2019-08-03 LAB — ABO/RH: ABO/RH(D): O POS

## 2019-08-03 LAB — FOLATE: Folate: 9.4 ng/mL (ref >5.9)

## 2019-08-03 LAB — LACTATE DEHYDROGENASE: LDH: 136 U/L (ref 98–192)

## 2019-08-03 NOTE — Telephone Encounter (Signed)
-----   Message from Earlie Server, MD sent at 08/02/2019 11:25 PM EST ----- B12 was checked in the hospital and was low. He is not on B12. Please advise him to pick  up oral b12 rx and take immediately. Thanks.

## 2019-08-03 NOTE — Telephone Encounter (Signed)
Patient's wife called requesting [facility]. DME now comes from [facility]. Brad from [employer] was notified of patient's request and he will call patient with information. I talked to Mr. Larry Lam to notify him that someone from [employer] will be contacting him.

## 2019-08-03 NOTE — Telephone Encounter (Signed)
Oral Oncology Patient Advocate Encounter  Received notification from [employer] that prior authorization for Venclexta is required.  PA submitted on CoverMyMeds Key [id number] Status is pending  Oral Oncology Clinic will continue to follow.  [place] Patient [place] Phone [phone] Fax [phone]

## 2019-08-03 NOTE — Telephone Encounter (Signed)
Oral Oncology Patient Advocate Encounter  Prior Authorization for Larry Lam has been approved.    PA# [id number] Effective dates: 05/04/2019 through 08/01/2022  Patients co-pay is $8.95.  Oral Oncology Clinic will continue to follow.   [place] Patient [place] Phone [phone] Fax [phone] 08/03/2019 8:14 AM

## 2019-08-03 NOTE — Telephone Encounter (Signed)
Patient informed. 

## 2019-08-03 NOTE — Progress Notes (Signed)
Patient suffers from chronic lymphocytic Leukemia which impairs their ability to perform daily activities like walking in the home.  A cane or walker will not resolve issue with performing activities of daily living. A wheelchair will allow patient to safely perform daily activities. Patient is not able to propel themselves in the home using a standard weight wheelchair due to weakness. Patient can self propel in the lightweight wheelchair. Length of need : 99 months. Accessories: elevating leg rests (ELRs), wheel locks, extensions and anti-tippers, back cushion.

## 2019-08-04 ENCOUNTER — Inpatient Hospital Stay: Payer: Medicare Other

## 2019-08-05 ENCOUNTER — Other Ambulatory Visit: Payer: Self-pay

## 2019-08-05 ENCOUNTER — Emergency Department (HOSPITAL_COMMUNITY): Payer: Medicare Other

## 2019-08-05 ENCOUNTER — Ambulatory Visit: Payer: Medicare Other

## 2019-08-05 ENCOUNTER — Encounter (HOSPITAL_COMMUNITY): Payer: Self-pay | Admitting: *Deleted

## 2019-08-05 ENCOUNTER — Inpatient Hospital Stay (HOSPITAL_COMMUNITY)
Admission: EM | Admit: 2019-08-05 | Discharge: 2019-08-13 | DRG: 870 | Disposition: A | Discharge status: Home / Self Care | Payer: Medicare Other | Attending: Internal Medicine | Admitting: Internal Medicine

## 2019-08-05 DIAGNOSIS — Z01818 Encounter for other preprocedural examination: Secondary | ICD-10-CM

## 2019-08-05 DIAGNOSIS — Z888 Allergy status to other drugs, medicaments and biological substances status: Secondary | ICD-10-CM

## 2019-08-05 DIAGNOSIS — E1165 Type 2 diabetes mellitus with hyperglycemia: Secondary | ICD-10-CM | POA: Diagnosis not present

## 2019-08-05 DIAGNOSIS — A4101 Sepsis due to Methicillin susceptible Staphylococcus aureus: Secondary | ICD-10-CM | POA: Diagnosis present

## 2019-08-05 DIAGNOSIS — J9601 Acute respiratory failure with hypoxia: Secondary | ICD-10-CM | POA: Diagnosis not present

## 2019-08-05 DIAGNOSIS — E119 Type 2 diabetes mellitus without complications: Secondary | ICD-10-CM | POA: Diagnosis not present

## 2019-08-05 DIAGNOSIS — Z7951 Long term (current) use of inhaled steroids: Secondary | ICD-10-CM | POA: Diagnosis not present

## 2019-08-05 DIAGNOSIS — R0902 Hypoxemia: Secondary | ICD-10-CM

## 2019-08-05 DIAGNOSIS — Z801 Family history of malignant neoplasm of trachea, bronchus and lung: Secondary | ICD-10-CM | POA: Diagnosis not present

## 2019-08-05 DIAGNOSIS — D513 Other dietary vitamin B12 deficiency anemia: Secondary | ICD-10-CM | POA: Diagnosis not present

## 2019-08-05 DIAGNOSIS — Z9289 Personal history of other medical treatment: Secondary | ICD-10-CM

## 2019-08-05 DIAGNOSIS — J9610 Chronic respiratory failure, unspecified whether with hypoxia or hypercapnia: Secondary | ICD-10-CM | POA: Diagnosis not present

## 2019-08-05 DIAGNOSIS — D696 Thrombocytopenia, unspecified: Secondary | ICD-10-CM | POA: Diagnosis present

## 2019-08-05 DIAGNOSIS — J9621 Acute and chronic respiratory failure with hypoxia: Secondary | ICD-10-CM | POA: Diagnosis present

## 2019-08-05 DIAGNOSIS — C9110 Chronic lymphocytic leukemia of B-cell type not having achieved remission: Secondary | ICD-10-CM

## 2019-08-05 DIAGNOSIS — C8599 Non-Hodgkin lymphoma, unspecified, extranodal and solid organ sites: Secondary | ICD-10-CM

## 2019-08-05 DIAGNOSIS — Z7984 Long term (current) use of oral hypoglycemic drugs: Secondary | ICD-10-CM | POA: Diagnosis not present

## 2019-08-05 DIAGNOSIS — Y92239 Unspecified place in hospital as the place of occurrence of the external cause: Secondary | ICD-10-CM | POA: Diagnosis not present

## 2019-08-05 DIAGNOSIS — J9622 Acute and chronic respiratory failure with hypercapnia: Secondary | ICD-10-CM | POA: Diagnosis present

## 2019-08-05 DIAGNOSIS — J156 Pneumonia due to other aerobic Gram-negative bacteria: Secondary | ICD-10-CM | POA: Diagnosis present

## 2019-08-05 DIAGNOSIS — J9600 Acute respiratory failure, unspecified whether with hypoxia or hypercapnia: Secondary | ICD-10-CM

## 2019-08-05 DIAGNOSIS — D539 Nutritional anemia, unspecified: Secondary | ICD-10-CM | POA: Diagnosis present

## 2019-08-05 DIAGNOSIS — Z20828 Contact with and (suspected) exposure to other viral communicable diseases: Secondary | ICD-10-CM | POA: Diagnosis present

## 2019-08-05 DIAGNOSIS — T451X5A Adverse effect of antineoplastic and immunosuppressive drugs, initial encounter: Secondary | ICD-10-CM | POA: Diagnosis not present

## 2019-08-05 DIAGNOSIS — J440 Chronic obstructive pulmonary disease with acute lower respiratory infection: Secondary | ICD-10-CM | POA: Diagnosis present

## 2019-08-05 DIAGNOSIS — M87851 Other osteonecrosis, right femur: Secondary | ICD-10-CM | POA: Diagnosis present

## 2019-08-05 DIAGNOSIS — D630 Anemia in neoplastic disease: Secondary | ICD-10-CM | POA: Diagnosis present

## 2019-08-05 DIAGNOSIS — Z87891 Personal history of nicotine dependence: Secondary | ICD-10-CM

## 2019-08-05 DIAGNOSIS — I10 Essential (primary) hypertension: Secondary | ICD-10-CM | POA: Diagnosis present

## 2019-08-05 DIAGNOSIS — C9210 Chronic myeloid leukemia, BCR/ABL-positive, not having achieved remission: Secondary | ICD-10-CM

## 2019-08-05 DIAGNOSIS — D499 Neoplasm of unspecified behavior of unspecified site: Secondary | ICD-10-CM | POA: Diagnosis present

## 2019-08-05 DIAGNOSIS — D519 Vitamin B12 deficiency anemia, unspecified: Secondary | ICD-10-CM | POA: Diagnosis not present

## 2019-08-05 DIAGNOSIS — D869 Sarcoidosis, unspecified: Secondary | ICD-10-CM | POA: Diagnosis present

## 2019-08-05 DIAGNOSIS — Z7982 Long term (current) use of aspirin: Secondary | ICD-10-CM | POA: Diagnosis not present

## 2019-08-05 DIAGNOSIS — I950 Idiopathic hypotension: Secondary | ICD-10-CM | POA: Diagnosis not present

## 2019-08-05 DIAGNOSIS — J9602 Acute respiratory failure with hypercapnia: Secondary | ICD-10-CM | POA: Diagnosis not present

## 2019-08-05 DIAGNOSIS — T380X5A Adverse effect of glucocorticoids and synthetic analogues, initial encounter: Secondary | ICD-10-CM | POA: Diagnosis not present

## 2019-08-05 DIAGNOSIS — Z79899 Other long term (current) drug therapy: Secondary | ICD-10-CM

## 2019-08-05 DIAGNOSIS — Z4659 Encounter for fitting and adjustment of other gastrointestinal appliance and device: Secondary | ICD-10-CM

## 2019-08-05 DIAGNOSIS — J15211 Pneumonia due to Methicillin susceptible Staphylococcus aureus: Secondary | ICD-10-CM | POA: Diagnosis present

## 2019-08-05 DIAGNOSIS — N179 Acute kidney failure, unspecified: Secondary | ICD-10-CM | POA: Diagnosis not present

## 2019-08-05 DIAGNOSIS — J449 Chronic obstructive pulmonary disease, unspecified: Secondary | ICD-10-CM | POA: Diagnosis not present

## 2019-08-05 DIAGNOSIS — J9620 Acute and chronic respiratory failure, unspecified whether with hypoxia or hypercapnia: Secondary | ICD-10-CM | POA: Diagnosis not present

## 2019-08-05 DIAGNOSIS — J441 Chronic obstructive pulmonary disease with (acute) exacerbation: Secondary | ICD-10-CM

## 2019-08-05 DIAGNOSIS — M87852 Other osteonecrosis, left femur: Secondary | ICD-10-CM | POA: Diagnosis present

## 2019-08-05 DIAGNOSIS — J9690 Respiratory failure, unspecified, unspecified whether with hypoxia or hypercapnia: Secondary | ICD-10-CM

## 2019-08-05 DIAGNOSIS — I469 Cardiac arrest, cause unspecified: Secondary | ICD-10-CM

## 2019-08-05 DIAGNOSIS — J189 Pneumonia, unspecified organism: Secondary | ICD-10-CM | POA: Diagnosis not present

## 2019-08-05 DIAGNOSIS — Z9981 Dependence on supplemental oxygen: Secondary | ICD-10-CM

## 2019-08-05 LAB — CBC WITH DIFFERENTIAL/PLATELET
Abs Immature Granulocytes: 0.12 10*3/uL — ABNORMAL HIGH (ref 0.00–0.07)
Basophils Absolute: 0.1 10*3/uL (ref 0.0–0.1)
Basophils Relative: 0 %
Eosinophils Absolute: 0 10*3/uL (ref 0.0–0.5)
Eosinophils Relative: 0 %
HCT: 28.7 % — ABNORMAL LOW (ref 39.0–52.0)
Hemoglobin: 8.2 g/dL — ABNORMAL LOW (ref 13.0–17.0)
Immature Granulocytes: 0 %
Lymphocytes Relative: 97 %
Lymphs Abs: 38.4 10*3/uL — ABNORMAL HIGH (ref 0.7–4.0)
MCH: 28.6 pg (ref 26.0–34.0)
MCHC: 28.6 g/dL — ABNORMAL LOW (ref 30.0–36.0)
MCV: 100 fL (ref 80.0–100.0)
Monocytes Absolute: 0.4 10*3/uL (ref 0.1–1.0)
Monocytes Relative: 1 %
Neutro Abs: 0.9 10*3/uL — ABNORMAL LOW (ref 1.7–7.7)
Neutrophils Relative %: 2 %
Platelets: 40 10*3/uL — ABNORMAL LOW (ref 150–400)
RBC: 2.87 MIL/uL — ABNORMAL LOW (ref 4.22–5.81)
RDW: 17.7 % — ABNORMAL HIGH (ref 11.5–15.5)
WBC: 39.8 10*3/uL — ABNORMAL HIGH (ref 4.0–10.5)
nRBC: 0.2 % (ref 0.0–0.2)

## 2019-08-05 LAB — RESPIRATORY PANEL BY RT PCR (FLU A&B, COVID)
Influenza A by PCR: NEGATIVE
Influenza B by PCR: NEGATIVE
SARS Coronavirus 2 by RT PCR: NEGATIVE

## 2019-08-05 LAB — BLOOD GAS, ARTERIAL
Acid-Base Excess: 8.3 mmol/L — ABNORMAL HIGH (ref 0.0–2.0)
Acid-Base Excess: 9.7 mmol/L — ABNORMAL HIGH (ref 0.0–2.0)
Bicarbonate: 30.6 mmol/L — ABNORMAL HIGH (ref 20.0–28.0)
Bicarbonate: 32.3 mmol/L — ABNORMAL HIGH (ref 20.0–28.0)
FIO2: 44
FIO2: 50
O2 Saturation: 96.6 %
O2 Saturation: 84.8 %
Patient temperature: 36.5
Patient temperature: 37
pCO2 arterial: 103 mmHg (ref 32.0–48.0)
pCO2 arterial: 86.6 mmHg (ref 32.0–48.0)
pH, Arterial: 7.177 — CL (ref 7.350–7.450)
pH, Arterial: 7.252 — ABNORMAL LOW (ref 7.350–7.450)
pO2, Arterial: 107 mmHg (ref 83.0–108.0)
pO2, Arterial: 57.5 mmHg — ABNORMAL LOW (ref 83.0–108.0)

## 2019-08-05 LAB — BASIC METABOLIC PANEL
Anion gap: 14 (ref 5–15)
BUN: 26 mg/dL — ABNORMAL HIGH (ref 6–20)
CO2: 33 mmol/L — ABNORMAL HIGH (ref 22–32)
Calcium: 9.3 mg/dL (ref 8.9–10.3)
Chloride: 93 mmol/L — ABNORMAL LOW (ref 98–111)
Creatinine, Ser: 0.69 mg/dL (ref 0.61–1.24)
GFR calc Af Amer: >60 mL/min (ref >60)
GFR calc non Af Amer: >60 mL/min (ref >60)
Glucose, Bld: 216 mg/dL — ABNORMAL HIGH (ref 70–99)
Potassium: 4.8 mmol/L (ref 3.5–5.1)
Sodium: 140 mmol/L (ref 135–145)

## 2019-08-05 LAB — MRSA PCR SCREENING: MRSA by PCR: NEGATIVE

## 2019-08-05 LAB — CBG MONITORING, ED
Glucose-Capillary: 192 mg/dL — ABNORMAL HIGH (ref 70–99)
Glucose-Capillary: 162 mg/dL — ABNORMAL HIGH (ref 70–99)

## 2019-08-05 LAB — BRAIN NATRIURETIC PEPTIDE: B Natriuretic Peptide: 265 pg/mL — ABNORMAL HIGH (ref 0.0–100.0)

## 2019-08-05 LAB — GLUCOSE, CAPILLARY: Glucose-Capillary: 188 mg/dL — ABNORMAL HIGH (ref 70–99)

## 2019-08-05 LAB — LACTIC ACID, PLASMA: Lactic Acid, Venous: 0.4 mmol/L — ABNORMAL LOW (ref 0.5–1.9)

## 2019-08-05 LAB — PROCALCITONIN: Procalcitonin: <0.1 ng/mL

## 2019-08-05 MED ORDER — CHLORHEXIDINE GLUCONATE CLOTH 2 % EX PADS
6.0000 | MEDICATED_PAD | Freq: Every day | CUTANEOUS | Status: DC
  Administered 2019-08-06 – 2019-08-12 (×6): 6 via TOPICAL

## 2019-08-05 MED ORDER — SODIUM CHLORIDE 0.9 % IV SOLN
1.0000 g | Freq: Once | INTRAVENOUS | Status: DC
  Filled 2019-08-05: qty 1

## 2019-08-05 MED ORDER — IPRATROPIUM-ALBUTEROL 0.5-2.5 (3) MG/3ML IN SOLN
3.0000 mL | Freq: Four times a day (QID) | RESPIRATORY_TRACT | Status: AC
  Administered 2019-08-05 – 2019-08-06 (×5): 3 mL via RESPIRATORY_TRACT
  Filled 2019-08-05 (×4): qty 3

## 2019-08-05 MED ORDER — LORAZEPAM 2 MG/ML IJ SOLN
0.5000 mg | Freq: Once | INTRAMUSCULAR | Status: AC
  Administered 2019-08-05: 19:00:00 0.5 mg via INTRAVENOUS
  Filled 2019-08-05: qty 1

## 2019-08-05 MED ORDER — ESCITALOPRAM OXALATE 10 MG PO TABS: 20.0000 mg | ORAL_TABLET | Freq: Every day | ORAL | Status: DC

## 2019-08-05 MED ORDER — POLYETHYLENE GLYCOL 3350 17 G PO PACK: 17.0000 g | PACK | Freq: Every day | ORAL | Status: DC | PRN

## 2019-08-05 MED ORDER — SODIUM CHLORIDE 0.9 % IV SOLN
500.0000 mg | INTRAVENOUS | Status: DC
  Administered 2019-08-05 – 2019-08-08 (×4): 500 mg via INTRAVENOUS
  Filled 2019-08-05 (×5): qty 500

## 2019-08-05 MED ORDER — IPRATROPIUM BROMIDE HFA 17 MCG/ACT IN AERS
2.0000 | INHALATION_SPRAY | RESPIRATORY_TRACT | Status: DC
  Filled 2019-08-05: qty 12.9

## 2019-08-05 MED ORDER — BUDESONIDE 0.5 MG/2ML IN SUSP
0.5000 mg | Freq: Two times a day (BID) | RESPIRATORY_TRACT | Status: DC
  Administered 2019-08-05 – 2019-08-13 (×16): 0.5 mg via RESPIRATORY_TRACT
  Filled 2019-08-05 (×16): qty 2

## 2019-08-05 MED ORDER — SODIUM CHLORIDE 0.9 % IV SOLN
2.0000 g | Freq: Three times a day (TID) | INTRAVENOUS | Status: DC
  Administered 2019-08-05 – 2019-08-09 (×11): 2 g via INTRAVENOUS
  Filled 2019-08-05 (×13): qty 2

## 2019-08-05 MED ORDER — IPRATROPIUM-ALBUTEROL 20-100 MCG/ACT IN AERS
4.0000 | INHALATION_SPRAY | RESPIRATORY_TRACT | Status: DC
  Administered 2019-08-05 (×6): 4 via RESPIRATORY_TRACT
  Filled 2019-08-05: qty 4

## 2019-08-05 MED ORDER — INSULIN ASPART 100 UNIT/ML ~~LOC~~ SOLN
0.0000 [IU] | SUBCUTANEOUS | Status: DC
  Administered 2019-08-05 (×2): 2 [IU] via SUBCUTANEOUS
  Administered 2019-08-06: 04:00:00 1 [IU] via SUBCUTANEOUS
  Filled 2019-08-05: qty 1

## 2019-08-05 MED ORDER — METHYLPREDNISOLONE SODIUM SUCC 125 MG IJ SOLR
125.0000 mg | Freq: Once | INTRAMUSCULAR | Status: AC
  Administered 2019-08-05: 10:00:00 125 mg via INTRAVENOUS
  Filled 2019-08-05: qty 2

## 2019-08-05 MED ORDER — ONDANSETRON HCL 4 MG/2ML IJ SOLN: 4.0000 mg | Freq: Four times a day (QID) | INTRAMUSCULAR | Status: DC | PRN

## 2019-08-05 MED ORDER — VANCOMYCIN HCL IN DEXTROSE 1-5 GM/200ML-% IV SOLN: 1000.0000 mg | INTRAVENOUS | Status: DC

## 2019-08-05 MED ORDER — ACETAMINOPHEN 325 MG PO TABS
650.0000 mg | ORAL_TABLET | Freq: Four times a day (QID) | ORAL | Status: DC | PRN
  Administered 2019-08-12 – 2019-08-13 (×2): 650 mg via ORAL
  Filled 2019-08-05 (×2): qty 2

## 2019-08-05 MED ORDER — SODIUM CHLORIDE 0.9 % IV SOLN
2.0000 g | Freq: Once | INTRAVENOUS | Status: AC
  Administered 2019-08-05: 2 g via INTRAVENOUS
  Filled 2019-08-05: qty 2

## 2019-08-05 MED ORDER — IPRATROPIUM-ALBUTEROL 0.5-2.5 (3) MG/3ML IN SOLN
3.0000 mL | RESPIRATORY_TRACT | Status: DC | PRN
  Filled 2019-08-05: qty 3

## 2019-08-05 MED ORDER — IOHEXOL 350 MG/ML SOLN
100.0000 mL | Freq: Once | INTRAVENOUS | Status: AC | PRN
  Administered 2019-08-05: 15:00:00 100 mL via INTRAVENOUS

## 2019-08-05 MED ORDER — ALBUTEROL SULFATE HFA 108 (90 BASE) MCG/ACT IN AERS: 2.0000 | INHALATION_SPRAY | RESPIRATORY_TRACT | Status: DC

## 2019-08-05 MED ORDER — SODIUM CHLORIDE 0.9 % IV SOLN
INTRAVENOUS | Status: AC
  Administered 2019-08-05 – 2019-08-06 (×3): via INTRAVENOUS

## 2019-08-05 MED ORDER — SODIUM CHLORIDE 0.9 % IV BOLUS
500.0000 mL | Freq: Once | INTRAVENOUS | Status: AC
  Administered 2019-08-05: 16:00:00 500 mL via INTRAVENOUS

## 2019-08-05 MED ORDER — VANCOMYCIN HCL IN DEXTROSE 1-5 GM/200ML-% IV SOLN
1000.0000 mg | INTRAVENOUS | Status: AC
  Administered 2019-08-05 (×2): 1000 mg via INTRAVENOUS
  Filled 2019-08-05 (×2): qty 200

## 2019-08-05 MED ORDER — LABETALOL HCL 5 MG/ML IV SOLN
10.0000 mg | INTRAVENOUS | Status: DC | PRN
  Administered 2019-08-05 – 2019-08-07 (×2): 10 mg via INTRAVENOUS
  Filled 2019-08-05 (×2): qty 4

## 2019-08-05 MED ORDER — GLIPIZIDE 5 MG PO TABS
5.0000 mg | ORAL_TABLET | Freq: Two times a day (BID) | ORAL | Status: DC
  Administered 2019-08-07: 08:00:00 5 mg via ORAL
  Filled 2019-08-05: qty 1

## 2019-08-05 MED ORDER — ONDANSETRON HCL 4 MG PO TABS: 4.0000 mg | ORAL_TABLET | Freq: Four times a day (QID) | ORAL | Status: DC | PRN

## 2019-08-05 MED ORDER — ALBUTEROL SULFATE HFA 108 (90 BASE) MCG/ACT IN AERS: 6.0000 | INHALATION_SPRAY | Freq: Once | RESPIRATORY_TRACT | Status: DC

## 2019-08-05 MED ORDER — ATORVASTATIN CALCIUM 40 MG PO TABS: 40.0000 mg | ORAL_TABLET | Freq: Every day | ORAL | Status: DC

## 2019-08-05 MED ORDER — VANCOMYCIN HCL IN DEXTROSE 1-5 GM/200ML-% IV SOLN
1000.0000 mg | Freq: Three times a day (TID) | INTRAVENOUS | Status: DC
  Administered 2019-08-05 – 2019-08-06 (×2): 1000 mg via INTRAVENOUS
  Filled 2019-08-05 (×2): qty 200

## 2019-08-05 MED ORDER — ACETAMINOPHEN 650 MG RE SUPP: 650.0000 mg | Freq: Four times a day (QID) | RECTAL | Status: DC | PRN

## 2019-08-05 NOTE — Progress Notes (Addendum)
Pharmacy Antibiotic Note  Larry Lam is a 48 y.o. male admitted on 08/05/2019 with pneumonia.  Pharmacy has been consulted for Vancomycin dosing.  Plan: Vancomycin 2000 mg IV x 1 dose. Vancomycin 1000 mg IV every 8 hours.  Goal trough 15-20 mcg/mL. Cefepime 2000 mg IV every 8 hours. Monitor labs, c/s, and vanco level as indicated.  Height: 5\' 10"  (177.8 cm) Weight: 227 lb 1.2 oz (103 kg) IBW/kg (Calculated) : 73  Temp (24hrs), Avg:97.7 F (36.5 C), Min:97.7 F (36.5 C), Max:97.7 F (36.5 C)  Recent Labs  Lab 07/31/19 0152 07/31/19 1637 08/01/19 0556 08/02/19 0432 08/03/19 1300 08/05/19 0839  WBC 17.7* 23.3* 21.1* 21.3* 17.0* 39.8*  CREATININE 0.80 0.83  --  0.77  --  0.69    Estimated Creatinine Clearance: 134.3 mL/min (by C-G formula based on SCr of 0.69 mg/dL).    Allergies  Allergen Reactions  . Amlodipine Besylate-Valsartan Other (See Comments)    Lowered potassium  . Hydrochlorothiazide Other (See Comments)    Lowered potassium    Antimicrobials this admission: Vanco 12/10 >>  Cefepime 12/10 >>  Dose adjustments this admission: N/A  Microbiology results: 12/10 MRSA PCR: pending  Thank you for allowing pharmacy to be a part of this patient's care.  Ramond Craver 08/05/2019 12:27 PM

## 2019-08-05 NOTE — ED Provider Notes (Signed)
ATTENDING SUPERVISORY NOTE I have personally viewed the imaging studies performed. I have personally seen and examined the patient, and discussed the plan of care with the resident.  I have reviewed the documentation of the resident and agree.  No diagnosis found.  Patient presents in respiratory distress pulse ox 40% on 3 L.  Patient placed immediately into her room on nonrebreather.  Patient is oxygenation improved to 90s.  Patient has decreased air movement bilateral.  Discussed differential with patient including COPD exacerbation, viral/Covid, pulmonary embolism, other.  Plan for cardiac screen, albuterol, steroids, portable chest x-ray, Covid testing and reassessment.  Transition patient to 5 L nasal cannula patient is tachypneic however improved.  .Critical Care Performed by: Elnora Morrison, MD Authorized by: Elnora Morrison, MD   Critical care provider statement:    Critical care time (minutes):  75   Critical care start time:  08/05/2019 7:16 AM   Critical care end time:  08/05/2019 9:31 AM   Critical care time was exclusive of:  Separately billable procedures and treating other patients and teaching time   Critical care was necessary to treat or prevent imminent or life-threatening deterioration of the following conditions:  Respiratory failure   Critical care was time spent personally by me on the following activities:  Evaluation of patient's response to treatment, examination of patient, ordering and performing treatments and interventions, ordering and review of laboratory studies, ordering and review of radiographic studies, pulse oximetry, re-evaluation of patient's condition, obtaining history from patient or surrogate and review of old charts  Larry Lam was evaluated in Emergency Department on 08/05/2019 for the symptoms described in the history of present illness. He was evaluated in the context of the global COVID-19 pandemic, which necessitated consideration that the  patient might be at risk for infection with the SARS-CoV-2 virus that causes COVID-19. Institutional protocols and algorithms that pertain to the evaluation of patients at risk for COVID-19 are in a state of rapid change based on information released by regulatory bodies including the CDC and federal and state organizations. These policies and algorithms were followed during the patient's care in the ED.   Patient stabilized on 5 L nasal cannula.  Flu and Covid test negative.  Patient now able to go for CT angiogram.  Paged hospitalist for admission as patient will require admission to step down for pneumonia/ copd exacerbation.    Elnora Morrison, MD 08/05/19 1515

## 2019-08-05 NOTE — H&P (Addendum)
History and Physical    Larry Lam [id number] DOB: Jul 24, 1971 DOA: 08/05/2019  PCP: [name], MD   Patient coming from: Home  I have personally briefly reviewed patient's old medical records in [place]  Chief Complaint: Difficulty breathing.  HPI: Larry Lam is a 48 y.o. male with medical history significant for recent diagnosis of CLL/CML, acute on chronic respiratory failure on 2 L O2, COPD, seizures, diabetes mellitus, hypertension. Patient presented to the ED with complaints of increasing difficulty breathing going on over the past 1 - 2 weeks.  He denies chest pain.  No cough.  Reports weight loss.  Denies leg swelling.  Not on diuretics. He was given a course of Augmentin which he completed, by his primary care provider, port did not have significant improvement in his symptoms.   He was subsequently just hospitalized 12/5-12/6 for acute anemia and thrombocytopenia, for which was transfused with 3 units PRBC and platelets. The next day patient was in the ED again 12/7 for difficulty breathing that was transient, O2 sats were 98% on his baseline 2 L O2.  Concern was for panic attack versus COPD flare.  As he was at his baseline he was discharged home.   ED Course: Tachycardic, tachypneic, blood pressure systolic [id number] to [id number], O2 sats initially 40% on 3 L nasal cannula, initially placed on nonrebreather then switched to high flow nasal cannula, with sats maintaining greater than 92%.  Portable chest x-ray -increasing patchy airspace infiltrate laterally at the left lung base suspicious for pneumonia.  Subsequent CTA chest-small bilateral pleural effusions with diffuse bilateral interlobular septal thickening and scattered groundglass opacity throughout the lungs.  Consistent with infection or edema.  Covid PCR test , influenza AMB negative.  On vancomycin and cefepime started in the ED.  125 IV Solu-Medrol given.  Review of Systems: As per HPI all other  systems reviewed and negative.  Past Medical History:  Diagnosis Date  . Asthma   . COPD (chronic obstructive pulmonary disease) ([place])   . Diabetes mellitus without complication ([place])   . Hypertension   . Seizures ([place])    3-4 years ago. Single episode.  . Tobacco use     History reviewed. No pertinent surgical history.   reports that he quit smoking about 4 months ago. His smoking use included cigarettes. He has a 33.00 pack-year smoking history. He has quit using smokeless tobacco. He reports current alcohol use. He reports that he does not use drugs.  Allergies  Allergen Reactions  . Amlodipine Besylate-Valsartan Other (See Comments)    Lowered potassium  . Hydrochlorothiazide Other (See Comments)    Lowered potassium    Family History  Problem Relation Age of Onset  . Lung cancer Maternal Grandfather     Prior to Admission medications   Medication Sig Start Date End Date Taking? Authorizing Provider  albuterol (PROVENTIL HFA;VENTOLIN HFA) 108 (90 Base) MCG/ACT inhaler Inhale 2 puffs into the lungs every 6 (six) hours as needed for wheezing or shortness of breath. 04/29/17  Yes Tat, Shanon Brow, MD  ASPIRIN 81 PO Take 81 mg by mouth.   Yes [provider]  atorvastatin (LIPITOR) 40 MG tablet Take 1 tablet by mouth daily. 08/04/19  Yes [provider]  azithromycin (ZITHROMAX Z-PAK) 250 MG tablet Take 1 tablet (250 mg total) by mouth daily. 500mg  PO day 1, then 250mg  PO days 205 08/02/19  Yes Noemi Chapel, MD  escitalopram (LEXAPRO) 20 MG tablet Take 20 mg by  mouth daily. 05/11/19  Yes [provider]  glipiZIDE (GLUCOTROL) 5 MG tablet Take 1 tablet (5 mg total) by mouth 2 (two) times daily. 04/29/17 08/05/19 Yes Tat, Shanon Brow, MD  ipratropium-albuterol (DUONEB) 0.5-2.5 (3) MG/3ML SOLN Take 3 mLs by nebulization every 6 (six) hours as needed. J44.9 and J44.1 04/29/17  Yes Tat, Shanon Brow, MD  lisinopril (PRINIVIL,ZESTRIL) 20 MG tablet Take 1 tablet (20 mg total) by mouth  daily. 04/30/17  Yes Tat, Shanon Brow, MD  metFORMIN (GLUCOPHAGE) 500 MG tablet Take 1 tablet (500 mg total) by mouth 2 (two) times daily with a meal. Patient taking differently: Take 1,000 mg by mouth 2 (two) times daily with a meal.  04/29/17  Yes Tat, David, MD  umeclidinium bromide (INCRUSE ELLIPTA) 62.5 MCG/INH AEPB Inhale into the lungs. 04/06/19  Yes [provider]  vitamin B-12 (CYANOCOBALAMIN) 1000 MCG tablet Take 1 tablet (1,000 mcg total) by mouth daily. 08/02/19  Yes Earlie Server, MD  Fluticasone-Salmeterol (ADVAIR) 250-50 MCG/DOSE AEPB Inhale into the lungs. 04/06/19   [provider]    Physical Exam: Vitals:   08/05/19 1400 08/05/19 1430 08/05/19 1600 08/05/19 1630  BP: (!) 170/107 139/76 (!) 150/71 (!) 151/74  Pulse: (!) 117 (!) 111 (!) 110 (!) 113  Resp: (!) 22 (!) 33 (!) 23 19  Temp:      TempSrc:      SpO2: 92% 93% 94% 92%  Weight:      Height:        Constitutional: Mild to moderate increased work of breathing. Vitals:   08/05/19 1400 08/05/19 1430 08/05/19 1600 08/05/19 1630  BP: (!) 170/107 139/76 (!) 150/71 (!) 151/74  Pulse: (!) 117 (!) 111 (!) 110 (!) 113  Resp: (!) 22 (!) 33 (!) 23 19  Temp:      TempSrc:      SpO2: 92% 93% 94% 92%  Weight:      Height:       Eyes: PERRL, lids and conjunctivae normal ENMT: Mucous membranes are moist. Posterior pharynx clear of any exudate or lesions  Neck: normal, supple, no masses, no thyromegaly Respiratory: Mouth breathing, this is normal for him, faint crackles at bases bilaterally, no wheezing, on high flow nasal cannula 7 L.   Cardiovascular: Tachycardic, regular rate and rhythm, no murmurs / rubs / gallops. No extremity edema. 2+ pedal pulses.  Abdomen: no tenderness, no masses palpated. No hepatosplenomegaly. Bowel sounds positive.  Musculoskeletal: no clubbing / cyanosis. No joint deformity upper and lower extremities. Good ROM, no contractures. Normal muscle tone.  Skin: no rashes, lesions, ulcers. No  induration Neurologic: CN 2-12 grossly intact. Sensation intact, DTR normal. Strength 5/5 in all 4.  Psychiatric: Normal judgment and insight. Alert and oriented x 3. Normal mood.   Labs on Admission: I have personally reviewed following labs and imaging studies  CBC: Recent Labs  Lab 07/31/19 0152 07/31/19 1637 08/01/19 0556 08/01/19 1403 08/02/19 0432 08/03/19 1300 08/05/19 0839  WBC 17.7* 23.3* 21.1*  --  21.3* 17.0* 39.8*  NEUTROABS 0.5*  --   --   --   --  0.6* 0.9*  HGB 4.2* 7.4* 7.1* 8.0* 8.1* 7.4* 8.2*  HCT 14.2* 24.1* 23.4* 26.1* 26.2* 24.2* 28.7*  MCV 105.2* 94.9 95.1  --  96.3 94.2 100.0  PLT 37* 37* 40*  --  37* 32* 40*   Basic Metabolic Panel: Recent Labs  Lab 07/31/19 0152 07/31/19 1637 08/02/19 0432 08/05/19 0839  NA 137 136 137 140  K  4.2 4.3 4.5 4.8  CL 99 97* 94* 93*  CO2 31 30 33* 33*  GLUCOSE 192* 151* 156* 216*  BUN 23* 28* 26* 26*  CREATININE 0.80 0.83 0.77 0.69  CALCIUM 8.4* 8.6* 9.1 9.3   Liver Function Tests: Recent Labs  Lab 07/31/19 0152  AST 9*  ALT 9  ALKPHOS 87  BILITOT 0.4  PROT 7.0  ALBUMIN 3.3*   CBG: Recent Labs  Lab 08/01/19 1619 08/01/19 1637 08/01/19 2107 08/02/19 0731 08/02/19 1113  GLUCAP 145* 140* 157* 154* 135*   Anemia Panel: Recent Labs    08/03/19 1300  FOLATE 9.4  RETICCTPCT 1.5   Urine analysis:    Component Value Date/Time   COLORURINE YELLOW 07/31/2019 0202   APPEARANCEUR CLEAR 07/31/2019 0202   APPEARANCEUR Clear 07/05/2012 0103   LABSPEC 1.020 07/31/2019 0202   LABSPEC 1.033 07/05/2012 0103   PHURINE 5.0 07/31/2019 0202   GLUCOSEU NEGATIVE 07/31/2019 0202   GLUCOSEU >=500 07/05/2012 0103   HGBUR NEGATIVE 07/31/2019 0202   BILIRUBINUR NEGATIVE 07/31/2019 0202   BILIRUBINUR Negative 07/05/2012 0103   KETONESUR NEGATIVE 07/31/2019 0202   PROTEINUR NEGATIVE 07/31/2019 0202   NITRITE NEGATIVE 07/31/2019 0202   LEUKOCYTESUR NEGATIVE 07/31/2019 0202   LEUKOCYTESUR Negative 07/05/2012 0103      Radiological Exams on Admission: CT Angio Chest PE W/Cm &/Or Wo Cm  Result Date: 08/05/2019 CLINICAL DATA:  Shortness of breath, CLL EXAM: CT ANGIOGRAPHY CHEST WITH CONTRAST TECHNIQUE: Multidetector CT imaging of the chest was performed using the standard protocol during bolus administration of intravenous contrast. Multiplanar CT image reconstructions and MIPs were obtained to evaluate the vascular anatomy. CONTRAST:  128mL OMNIPAQUE IOHEXOL 350 MG/ML SOLN COMPARISON:  CT chest abdomen pelvis, 04/30/2011 FINDINGS: Cardiovascular: Satisfactory opacification of the pulmonary arteries to the segmental level. No evidence of pulmonary embolism. Normal heart size. No pericardial effusion. Mediastinum/Nodes: Numerous bulky bilateral axillary, lower cervical, mediastinal, and hilar lymph nodes, similar in size compared to CT dated 04/30/2019. Thyroid gland, trachea, and esophagus demonstrate no significant findings. Lungs/Pleura: Small bilateral pleural effusions. Diffuse bilateral interlobular septal thickening and scattered ground-glass opacities throughout the lungs. There are occasional small pulmonary nodules, which are new or enlarged when compared to prior CT. A nodule of the right pulmonary apex measures 7 mm, previously 3 mm when measured similarly (series 7, image 24). A new nodule of the right lower lobe measures 6 mm (series 7, image 84). Upper Abdomen: No acute abnormality. There is redemonstrated bulky celiac axis and gastrohepatic ligament lymphadenopathy, again similar to prior examination dated 04/30/2019 to the extent imaged. Musculoskeletal: No chest wall abnormality. No acute or significant osseous findings. Review of the MIP images confirms the above findings. IMPRESSION: 1.  Negative examination for pulmonary embolism. 2. Small bilateral pleural effusions with diffuse bilateral interlobular septal thickening and scattered ground-glass opacity throughout the lungs. These findings are  consistent with infection or edema. 3. There are occasional small pulmonary nodules, which are new or enlarged when compared to prior CT. A nodule of the right pulmonary apex measures 7 mm, previously 3 mm when measured similarly (series 7, image 24). A new nodule of the right lower lobe measures 6 mm (series 7, image 84). These are most likely infectious or inflammatory; pulmonary nodules are an unlikely manifestation of pulmonary involvement of lymphoma. 4. Numerous bulky bilateral axillary, lower cervical, mediastinal, and hilar lymph nodes, as well as bulky lymph nodes in the partially imaged upper abdomen, similar in size compared to CT dated 04/30/2019  and in keeping with diagnosis of CLL. Electronically Signed   By: Eddie Candle M.D.   On: 08/05/2019 15:26   DG Chest Port 1 View  Result Date: 08/05/2019 CLINICAL DATA:  PT c/o worsening SOB since last night. Pt states he is normally somewhat SOB at baseline. Hx HTN, diabetes, COPD, asthma, former smoker EXAM: PORTABLE CHEST - 1 VIEW COMPARISON:  08/02/2019 FINDINGS: Increasing patchy airspace infiltrate laterally at the left lung base. Diffuse interstitial markings throughout both lungs as before. Heart size normal. Right paratracheal soft tissue consistent with adenopathy. The aortopulmonary window is obscured suggesting adenopathy or mass. Blunting of left lateral costophrenic angle. No pneumothorax. Visualized bones unremarkable. IMPRESSION: 1. Increasing patchy airspace infiltrate laterally at the left lung base suspicious for pneumonia. 2. Probable mediastinal adenopathy. 3. Diffuse interstitial markings throughout both lungs as before. Electronically Signed   By: Lucrezia Europe M.D.   On: 08/05/2019 09:12    EKG: Independently reviewed.  Sinus tachycardia, QTc 441.  No significant change from prior.  Assessment/Plan Active Problems:   Multifocal pneumonia   Acute on chronic respiratory failure (HCC)  Acute on chronic hypoxic respiratory  failure-O2 sats initially 40% on 3 L O2.  On 2 L O2 at baseline, currently on 7 L HFNC, sats currently > 92%.  Likely secondary to multifocal pneumonia, possible mild component of COPD exacerbation. -Goal O2 sats greater than 88%. -Supplemental O2 -Respiratory protocol -IV Antibiotics. -Called by Patient's nurse about change in patient's mental status.  On reevaluation patient appears slightly drowsy, he is awake and able to communicate and answer my questions appropriately.  Will get stat ABG-resulted with marked respiratory acidosis - pH of 7.17, PCO2 of 103.  Patient is awake and able to answer questions will start on BiPAP.  His Covid PCR is negative.  Multifocal pneumonia- patient rules in for sepsis with tachycardia, tachypnea, leukocytosis 39.8(some of this likely from leukemia).  With increased O2 requirements.  Covid PCR negative, also negative influenza.  . Prescribed a course of Z-Pak 12/7, he has taken 2 doses.  CTA chest negative for PE, shows diffuse bilateral groundglass opacity-infection versus edema. BNP mildly elevated at 265, compared to prior 172 4 days ago.  No history of heart failure, no peripheral sign of volume overload. - Will Continue IV vancomycin and cefepime, with immunocompromise status. -IV azithromycin for atypical coverage. -Obtain procalcitonin -CBC, BMP a.m. - Obtain Lactic acid - 555ml bolus given cont N/s 100cc/hr x 12 hr  COPD- increasing difficulty breathing, no wheezing or rhonchi, no cough. 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  Follows with Dr. Tasia Catchings, [facility].  Reports he has not started chemotherapy, plans to start in about a week.  Chest CTA today shows numerous bulky bilateral axillary, lower cervical, mediastinal, and hilar lymph nodes, as well as bulky lymph nodes in the partially imaged upper abdomen, similar in size compared to CT dated 04/30/2019 and in keeping with diagnosis of  CLL.  Chronic anemia, thrombocytopenia-recent hospitalization transfused 3 units PRBC and 1 unit platelets.  Hemoglobin today 8.2, recent hemoglobin 12/6 was 8.  Platelets chronically low at 40. -Hold home aspirin, therapeutic DVT prophylaxis -Oncology consult  Hypertension-stable. -Home medications lisinopril held for now for contrast exposure -As needed labetalol  Diabetes mellitus-random glucose 216.  Recent Hemoglobin A1c 7.7 - SSI -Resume home glipizide -Hold home Metformin  Incidental findings pulmonary nodules- There are occasional small pulmonary nodules, which are new or enlarged when compared to prior  CT. A nodule of the right pulmonary apex measures 7 mm, previously 3 mm when measured similarly (series 7, image 24). A new nodule of the right lower lobe measures 6 mm (series 7, image 84). These are most likely infectious or inflammatory; pulmonary nodules are an unlikely manifestation of pulmonary involvement of lymphoma.  -Follow-up as outpatient  DVT prophylaxis: SCDs Code Status: Full code Family Communication: Son at bedside Disposition Plan: > 2 days Consults called: None Admission status: Inpatient, stepdown. I certify that at the point of admission it is my clinical judgment that the patient will require inpatient hospital care spanning beyond 2 midnights from the point of admission due to high intensity of service, high risk for further deterioration and high frequency of surveillance required. The following factors support the patient status of inpatient: Acute on chronic respiratory failure requiring up to 7 L supplemental O2 via high flow nasal cannula, septic with multifocal pneumonia in the setting of COPD, Current IV antibiotics and close monitoring and hence stepdown level of care.   Bethena Roys MD [facility]  08/05/2019, 6:38 PM

## 2019-08-05 NOTE — ED Notes (Signed)
Patient transported to CT 

## 2019-08-05 NOTE — ED Notes (Signed)
Pts O2 sats were 40% on 3L. Have converted to Non rebreather. Respiratory at bedside

## 2019-08-05 NOTE — ED Provider Notes (Signed)
[facility] EMERGENCY DEPARTMENT Provider Note   CSN: [id number] Arrival date & time: 08/05/19  [id number]     History Chief Complaint  Patient presents with   Shortness of Breath    Larry Lam is a 48 y.o. male presenting to the ED with worsening SOB and found to be hypoxic to 40%. PMH significant for recent diagnosis of CLL, anemia s/p blood transfusion on 12/5, HTN, DM, COPD/asthma on home 2L O2 via [place] chronically, tobacco use. Patient denies any recent covid exposure or sick contacts. Denies any fevers, chills, nausea, vomiting, cough, congestion. Denies this feeling similar to his COPD exacerbations in the past. Denies any LE swelling or chest pain. He was admitted on 12/5 for anemia with Hgb of 4.2 and received 3uPRBC and platelet transfusion. He was seen in the ED on 12/7 for similar symptoms and workup notable for EKG with sinus tachycardia without signs of ST changes, chest x-ray negative for focal infiltrates but concerning for atypical pneumonia vs small bilateral pleural effusion. Patient was discharged with z-pack.    Past Medical History:  Diagnosis Date   Asthma    COPD (chronic obstructive pulmonary disease) ([place])    Diabetes mellitus without complication ([place])    Hypertension    Seizures ([place])    3-4 years ago. Single episode.   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   Lymphadenopathy 05/06/2019   Splenomegaly 05/06/2019   Lung nodules 05/06/2019   CLL (chronic lymphocytic leukemia) ([place]) 04/21/2019   Cervical lymphadenopathy 04/21/2019   Shortness of breath 04/21/2019   Goals of care, counseling/discussion 04/21/2019   Chest pain    Hypophosphatemia 04/28/2017   Acute respiratory failure with hypoxia ([place]) 04/28/2017   Uncontrolled type 2 diabetes mellitus with hyperglycemia, without long-term current use of insulin ([place]) 04/28/2017   COPD exacerbation  ([place]) 04/27/2017   Tobacco use 04/27/2017   Seizures ([place]) 01/19/2015   Hypertension 01/19/2015   Hypokalemia 01/19/2015   Leucocytosis 01/19/2015   DM (diabetes mellitus) ([place]) 01/19/2015   Chronic obstructive pulmonary disease ([place]) 01/19/2015    History reviewed. No pertinent surgical history.     Family History  Problem Relation Age of Onset   Lung cancer Maternal Grandfather     Social History   Tobacco Use   Smoking status: Former Smoker    Packs/day: 1.00    Years: 33.00    Pack years: 33.00    Types: Cigarettes    Quit date: 03/23/2019    Years since quitting: 0.3   Smokeless tobacco: Former Systems developer  Substance Use Topics   Alcohol use: Yes    Alcohol/week: 0.0 standard drinks    Comment: rare   Drug use: No    Comment: smokes pot    Home Medications Prior to Admission medications   Medication Sig Start Date End Date Taking? Authorizing Provider  albuterol (PROVENTIL HFA;VENTOLIN HFA) 108 (90 Base) MCG/ACT inhaler Inhale 2 puffs into the lungs every 6 (six) hours as needed for wheezing or shortness of breath. 04/29/17  Yes Tat, Shanon Brow, MD  ASPIRIN 81 PO Take 81 mg by mouth.   Yes [provider]  atorvastatin (LIPITOR) 40 MG tablet Take 1 tablet by mouth daily. 08/04/19  Yes [provider]  azithromycin (ZITHROMAX Z-PAK) 250 MG tablet Take 1 tablet (250 mg total) by mouth daily. 500mg  PO day 1, then 250mg  PO days 205 08/02/19  Yes Sabra Heck,  Aaron Edelman, MD  escitalopram (LEXAPRO) 20 MG tablet Take 20 mg by mouth daily. 05/11/19  Yes [provider]  glipiZIDE (GLUCOTROL) 5 MG tablet Take 1 tablet (5 mg total) by mouth 2 (two) times daily. 04/29/17 08/05/19 Yes Tat, Shanon Brow, MD  ipratropium-albuterol (DUONEB) 0.5-2.5 (3) MG/3ML SOLN Take 3 mLs by nebulization every 6 (six) hours as needed. J44.9 and J44.1 04/29/17  Yes Tat, Shanon Brow, MD  lisinopril (PRINIVIL,ZESTRIL) 20 MG tablet Take 1 tablet (20 mg total) by mouth daily. 04/30/17  Yes Tat, Shanon Brow, MD    metFORMIN (GLUCOPHAGE) 500 MG tablet Take 1 tablet (500 mg total) by mouth 2 (two) times daily with a meal. Patient taking differently: Take 1,000 mg by mouth 2 (two) times daily with a meal.  04/29/17  Yes Tat, David, MD  umeclidinium bromide (INCRUSE ELLIPTA) 62.5 MCG/INH AEPB Inhale into the lungs. 04/06/19  Yes [provider]  vitamin B-12 (CYANOCOBALAMIN) 1000 MCG tablet Take 1 tablet (1,000 mcg total) by mouth daily. 08/02/19  Yes Earlie Server, MD  Fluticasone-Salmeterol (ADVAIR) 250-50 MCG/DOSE AEPB Inhale into the lungs. 04/06/19   [provider]    Allergies    Amlodipine besylate-valsartan and Hydrochlorothiazide  Review of Systems   Review of Systems  Constitutional: Negative for chills and fever.  HENT: Negative for congestion.   Respiratory: Positive for shortness of breath. Negative for cough and wheezing.   Cardiovascular: Negative for chest pain and leg swelling.  Gastrointestinal: Negative for abdominal pain, constipation, diarrhea, nausea and vomiting.  Skin: Positive for pallor.    Physical Exam Updated Vital Signs BP 139/76    Pulse (!) 111    Temp 97.7 F (36.5 C) (Oral)    Resp (!) 33    Ht 5\' 10"  (1.778 m)    Wt 103 kg    SpO2 93%    BMI 32.58 kg/m   Physical Exam Constitutional:      Appearance: He is ill-appearing and diaphoretic.  HENT:     Head: Normocephalic and atraumatic.  Cardiovascular:     Rate and Rhythm: Regular rhythm. Tachycardia present.     Pulses: Normal pulses.     Heart sounds: Normal heart sounds.  Pulmonary:     Effort: Tachypnea and respiratory distress present.     Breath sounds: Examination of the right-upper field reveals decreased breath sounds. Examination of the left-upper field reveals decreased breath sounds. Examination of the right-middle field reveals decreased breath sounds. Examination of the left-middle field reveals decreased breath sounds. Examination of the right-lower field reveals decreased breath  sounds. Examination of the left-lower field reveals decreased breath sounds. Decreased breath sounds present. No wheezing, rhonchi or rales.  Abdominal:     General: Bowel sounds are normal.     Palpations: Abdomen is soft.  Musculoskeletal:     Right lower leg: No tenderness. No edema.     Left lower leg: No tenderness. No edema.  Skin:    General: Skin is warm.     Capillary Refill: Capillary refill takes less than 2 seconds.  Neurological:     Mental Status: He is alert.     ED Results / Procedures / Treatments   Labs (all labs ordered are listed, but only abnormal results are displayed) Labs Reviewed  BASIC METABOLIC PANEL - Abnormal; Notable for the following components:      Result Value   Chloride 93 (*)    CO2 33 (*)    Glucose, Bld 216 (*)    BUN 26 (*)  All other components within normal limits  BRAIN NATRIURETIC PEPTIDE - Abnormal; Notable for the following components:   B Natriuretic Peptide 265.0 (*)    All other components within normal limits  CBC WITH DIFFERENTIAL/PLATELET - Abnormal; Notable for the following components:   WBC 39.8 (*)    RBC 2.87 (*)    Hemoglobin 8.2 (*)    HCT 28.7 (*)    MCHC 28.6 (*)    RDW 17.7 (*)    Platelets 40 (*)    Neutro Abs 0.9 (*)    Lymphs Abs 38.4 (*)    Abs Immature Granulocytes 0.12 (*)    All other components within normal limits  RESPIRATORY PANEL BY RT PCR (FLU A&B, COVID)  MRSA PCR SCREENING  TYPE AND SCREEN    EKG None  Radiology DG Chest Port 1 View  Result Date: 08/05/2019 CLINICAL DATA:  PT c/o worsening SOB since last night. Pt states he is normally somewhat SOB at baseline. Hx HTN, diabetes, COPD, asthma, former smoker EXAM: PORTABLE CHEST - 1 VIEW COMPARISON:  08/02/2019 FINDINGS: Increasing patchy airspace infiltrate laterally at the left lung base. Diffuse interstitial markings throughout both lungs as before. Heart size normal. Right paratracheal soft tissue consistent with adenopathy. The  aortopulmonary window is obscured suggesting adenopathy or mass. Blunting of left lateral costophrenic angle. No pneumothorax. Visualized bones unremarkable. IMPRESSION: 1. Increasing patchy airspace infiltrate laterally at the left lung base suspicious for pneumonia. 2. Probable mediastinal adenopathy. 3. Diffuse interstitial markings throughout both lungs as before. Electronically Signed   By: Lucrezia Europe M.D.   On: 08/05/2019 09:12    Procedures Procedures (including critical care time)  Medications Ordered in ED Medications  Ipratropium-Albuterol (COMBIVENT) respimat 4 puff (4 puffs Inhalation Given 08/05/19 1343)  vancomycin (VANCOCIN) IVPB 1000 mg/200 mL premix (has no administration in time range)  methylPREDNISolone sodium succinate (SOLU-MEDROL) 125 mg/2 mL injection 125 mg (125 mg Intravenous Given 08/05/19 0938)  vancomycin (VANCOCIN) IVPB 1000 mg/200 mL premix (0 mg Intravenous Stopped 08/05/19 1343)  ceFEPIme (MAXIPIME) 2 g in sodium chloride 0.9 % 100 mL IVPB (0 g Intravenous Stopped 08/05/19 1218)  iohexol (OMNIPAQUE) 350 MG/ML injection 100 mL (100 mLs Intravenous Contrast Given 08/05/19 1500)    ED Course  I have reviewed the triage vital signs and the nursing notes.  Pertinent labs & imaging results that were available during my care of the patient were reviewed by me and considered in my medical decision making (see chart for details).  Patient is a 48 yo male with PMH notable for CLL (diagnosed 2 months ago), COPD/Asthma on 2L O2 [place] chronically, and recent admission for symptomatic anemia on 12/5 requring 3U of pRBCs. He presented to ED today with severe hypoxia to 40%, tachypnea, and tachycardia. Patient initially placed on NRB with improvement in O2 sats. Transitioned to [place] with stability of O2 sats at 6L On exam patient appears very pale and is clammy to touch. He is noted to have tachycardia on ascultation and decreased breath sounds throughout without wheezing. EKG  notable for sinus tachycardia without signs of ST changes or arrhythmia. Differential at this time includes pulmonary embolism given cancer history vs COPD exacerbation vs symptomatic anemia vs pulmonary effusions. Will obtain CBC, BMP, BNP, CXR, type and screen. If kidney function WNL will plan to obtain CTA. Dose of methylprednisone and Albuterol/Ipatrophium provided.    11:00: CBC notable for leukocytosis of 39.8 with ALC of 38.4. Chest x-ray concerning for left lower lobe pneumonia.  Given recent admission on 12/5, concern for hospital acquired PNA. Will start Vanc and Cefepime. Hemoglobin stable at 8.2, Platelets 40 however this is stable from days prior. No signs of bleeding on exam. BNP WNL and BMP with stable electrolytes and kidney function. COVID pending. CTA ordered and pending. Order placed for admission. Awaiting call back.   Discussed case with hospitalist who opted to await further work up ([place], [place]) prior to admission.   1455: COVID, FLU A/B negative. CTA pending. Patient signed out to next ED provider. Plan to await CTA results to evaluate for PE and admit for further management.   Clinical Course as of Aug 05 1503  Thu Aug 05, 2019  0958 Platelets(!): 40 [KM]  0958 Resp(!): 23 [KM]  1125 SpO2: 99 % [KM]  1234 CT Angio Chest PE W/Cm &/Or Wo Cm [KM]  1321 Respiratory Panel by RT PCR (Flu A&B, Covid) - Nasopharyngeal Swab [KM]    Clinical Course User Index [KM] Danna Hefty, DO   MDM Rules/Calculators/A&P  Final Clinical Impression(s) / ED Diagnoses Final diagnoses:  Hypoxia  HCAP (healthcare-associated pneumonia)  COPD exacerbation [facility])    Rx / DC Orders ED Discharge Orders    None       Danna Hefty, DO 08/05/19 1506    Elnora Morrison, MD 08/05/19 1515    Elnora Morrison, MD 08/05/19 1536

## 2019-08-05 NOTE — ED Notes (Signed)
[facility] notified about change in mental status. Respiratory made aware about a stat ABG.

## 2019-08-05 NOTE — ED Notes (Signed)
CRITICAL VALUE ALERT  Critical Value:  Ph 7.177, pco2 103  Date & Time Notied:  08/05/2019, 1826  Provider Notified: Dr. Denton Brick  Orders Received/Actions taken: see chart

## 2019-08-05 NOTE — ED Notes (Signed)
CRITICAL VALUE ALERT  Critical Value:  pco2 86.6  Date & Time Notied:  08/05/2019 2216  Provider Notified:   Orders Received/Actions taken:

## 2019-08-05 NOTE — Progress Notes (Signed)
Pt transported to ICU from ED on non-invasive vent.  RT will continue to monitor.

## 2019-08-05 NOTE — ED Triage Notes (Signed)
Patient presents to the ED with shortness of breath since last night.  Patient on home 02 on 3 LPM nasal cannula sats initially 70% in triage, during triage patient dropped to 40%.  Patient alert, oriented and states he has history of COPD.

## 2019-08-06 ENCOUNTER — Other Ambulatory Visit: Payer: Self-pay

## 2019-08-06 ENCOUNTER — Inpatient Hospital Stay: Payer: Medicare Other

## 2019-08-06 ENCOUNTER — Inpatient Hospital Stay: Payer: Medicare Other | Admitting: Nurse Practitioner

## 2019-08-06 ENCOUNTER — Inpatient Hospital Stay (HOSPITAL_COMMUNITY): Payer: Medicare Other

## 2019-08-06 LAB — RETICULOCYTES
Immature Retic Fract: 21.4 % — ABNORMAL HIGH (ref 2.3–15.9)
RBC.: 2.17 MIL/uL — ABNORMAL LOW (ref 4.22–5.81)
Retic Count, Absolute: 24.7 10*3/uL (ref 19.0–186.0)
Retic Ct Pct: 1.1 % (ref 0.4–3.1)

## 2019-08-06 LAB — CBC
HCT: 22.6 % — ABNORMAL LOW (ref 39.0–52.0)
Hemoglobin: 6.5 g/dL — CL (ref 13.0–17.0)
MCH: 29 pg (ref 26.0–34.0)
MCHC: 28.8 g/dL — ABNORMAL LOW (ref 30.0–36.0)
MCV: 100.9 fL — ABNORMAL HIGH (ref 80.0–100.0)
Platelets: 27 10*3/uL — CL (ref 150–400)
RBC: 2.24 MIL/uL — ABNORMAL LOW (ref 4.22–5.81)
RDW: 17.8 % — ABNORMAL HIGH (ref 11.5–15.5)
WBC: 28.3 10*3/uL — ABNORMAL HIGH (ref 4.0–10.5)
nRBC: 0.3 % — ABNORMAL HIGH (ref 0.0–0.2)

## 2019-08-06 LAB — BLOOD GAS, ARTERIAL
Acid-Base Excess: 11.7 mmol/L — ABNORMAL HIGH (ref 0.0–2.0)
Acid-Base Excess: 12 mmol/L — ABNORMAL HIGH (ref 0.0–2.0)
Bicarbonate: 34.7 mmol/L — ABNORMAL HIGH (ref 20.0–28.0)
Bicarbonate: 34.6 mmol/L — ABNORMAL HIGH (ref 20.0–28.0)
FIO2: 50
FIO2: 44
O2 Saturation: 95.5 %
O2 Saturation: 93.9 %
Patient temperature: 37
Patient temperature: 37
pCO2 arterial: 90.5 mmHg (ref 32.0–48.0)
pCO2 arterial: 90.2 mmHg (ref 32.0–48.0)
pH, Arterial: 7.257 — ABNORMAL LOW (ref 7.350–7.450)
pH, Arterial: 7.262 — ABNORMAL LOW (ref 7.350–7.450)
pO2, Arterial: 81.8 mmHg — ABNORMAL LOW (ref 83.0–108.0)
pO2, Arterial: 78.4 mmHg — ABNORMAL LOW (ref 83.0–108.0)

## 2019-08-06 LAB — BASIC METABOLIC PANEL
Anion gap: 8 (ref 5–15)
BUN: 26 mg/dL — ABNORMAL HIGH (ref 6–20)
CO2: 35 mmol/L — ABNORMAL HIGH (ref 22–32)
Calcium: 8.4 mg/dL — ABNORMAL LOW (ref 8.9–10.3)
Chloride: 98 mmol/L (ref 98–111)
Creatinine, Ser: 0.73 mg/dL (ref 0.61–1.24)
GFR calc Af Amer: >60 mL/min (ref >60)
GFR calc non Af Amer: >60 mL/min (ref >60)
Glucose, Bld: 106 mg/dL — ABNORMAL HIGH (ref 70–99)
Potassium: 5.2 mmol/L — ABNORMAL HIGH (ref 3.5–5.1)
Sodium: 141 mmol/L (ref 135–145)

## 2019-08-06 LAB — GLUCOSE, CAPILLARY
Glucose-Capillary: 104 mg/dL — ABNORMAL HIGH (ref 70–99)
Glucose-Capillary: 110 mg/dL — ABNORMAL HIGH (ref 70–99)
Glucose-Capillary: 111 mg/dL — ABNORMAL HIGH (ref 70–99)
Glucose-Capillary: 115 mg/dL — ABNORMAL HIGH (ref 70–99)
Glucose-Capillary: 117 mg/dL — ABNORMAL HIGH (ref 70–99)
Glucose-Capillary: 145 mg/dL — ABNORMAL HIGH (ref 70–99)

## 2019-08-06 LAB — COMP PANEL: LEUKEMIA/LYMPHOMA: Immunophenotypic Profile: 90

## 2019-08-06 LAB — VITAMIN B12: Vitamin B-12: 168 pg/mL — ABNORMAL LOW (ref 180–914)

## 2019-08-06 LAB — DIRECT ANTIGLOBULIN TEST (NOT AT ARMC)
DAT, IgG: NEGATIVE
DAT, complement: NEGATIVE

## 2019-08-06 LAB — LACTATE DEHYDROGENASE: LDH: 125 U/L (ref 98–192)

## 2019-08-06 LAB — PREPARE RBC (CROSSMATCH)

## 2019-08-06 MED ORDER — SODIUM CHLORIDE 0.9% IV SOLUTION
Freq: Once | INTRAVENOUS | Status: AC
  Administered 2019-08-06: 16:00:00 via INTRAVENOUS

## 2019-08-06 MED ORDER — LORAZEPAM 2 MG/ML IJ SOLN
1.0000 mg | Freq: Once | INTRAMUSCULAR | Status: AC
  Administered 2019-08-06: 1 mg via INTRAVENOUS
  Filled 2019-08-06: qty 1

## 2019-08-06 MED ORDER — IOHEXOL 300 MG/ML  SOLN
100.0000 mL | Freq: Once | INTRAMUSCULAR | Status: AC | PRN
  Administered 2019-08-06: 22:00:00 100 mL via INTRAVENOUS

## 2019-08-06 MED ORDER — LORAZEPAM 2 MG/ML IJ SOLN
1.0000 mg | Freq: Once | INTRAMUSCULAR | Status: AC
  Administered 2019-08-06: 04:00:00 1 mg via INTRAVENOUS
  Filled 2019-08-06: qty 1

## 2019-08-06 MED ORDER — LORAZEPAM 2 MG/ML IJ SOLN
1.0000 mg | Freq: Three times a day (TID) | INTRAMUSCULAR | Status: AC | PRN
  Administered 2019-08-06 (×2): 1 mg via INTRAVENOUS
  Filled 2019-08-06 (×2): qty 1

## 2019-08-06 NOTE — Progress Notes (Signed)
PROGRESS NOTE    Larry Lam  ZWC:[id number] DOB: May 29, 1971 DOA: 08/05/2019 PCP: [name], MD    Assessment & Plan:   Active Problems:   Multifocal pneumonia   Acute on chronic respiratory failure (HCC)   Larry Lam is a 48 y.o. male with medical history significant for recent diagnosis of CLL/CML, acute on chronic respiratory failure on 2 L O2, COPD, seizures, diabetes mellitus, hypertension. Patient presented to the ED with complaints of increasing difficulty breathing going on over the past 1 - 2 weeks.  # Acute encephalopathy due to hypoxia and hypercapnia --tx underlying cause as below  # Acute on chronic hypoxic and hypercapnic respiratory failure # Chronic hypoxic respiratory failure on 2L at [facility] -O2 sats initially 40% on 3 L O2.  Likely secondary to multifocal pneumonia, possible component of COPD exacerbation.  COVID neg.  pH of 7.17, PCO2 of 103, so started on BiPAP.  Cefe and azithromycin ordered on presentation. PLAN: --continue BiPAP and monitor response with ABG -Goal O2 sats greater than 88%. -continue cefe and azithromycin  Multifocal pneumonia- patient rules in for sepsis with tachycardia, tachypnea, leukocytosis 39.8 (some of this likely from leukemia).  With increased O2 requirement.  Covid PCR negative, also negative influenza.  . Prescribed a course of Z-Pak 12/7, he has taken 2 doses.  CTA chest negative for PE, shows diffuse bilateral groundglass opacity-infection versus edema. BNP mildly elevated at 265, compared to prior 172 4 days ago.  No history of heart failure, no peripheral sign of volume overload. --Procal neg PLAN: - d/c IV vancomycin  --continue cefe and azithromycin  COPD- increasing difficulty breathing, no wheezing or rhonchi, no cough. 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  Follows with Dr. Tasia Catchings, [facility].  Reports he has not started  chemotherapy, plans to start in about a week.  Chest CTA on presentation showed numerous bulky bilateral axillary, lower cervical, mediastinal, and hilar lymph nodes, as well as bulky lymph nodes in the partially imaged upper abdomen, similar in size compared to CT dated 04/30/2019 and in keeping with diagnosis of CLL. --Hematology consult, who requested order for IR bone marrow biopsy to be done on Monday 08/09/19  Acute on chronic anemia, thrombocytopenia -recent hospitalization transfused 3 units PRBC and 1 unit platelets.  Hemoglobin on presentation 8.2, decreased to 6.5 today.  Plt 27, down from 40. PLAN: --Transfuse 1u pRBC --No need for plt transfusion currently given no signs of bleeding -Hold home aspirin, therapeutic DVT prophylaxis -Oncology consult  Hypertension-stable. -Home medications lisinopril held for now for contrast exposure -As needed labetalol  Diabetes mellitus-random glucose 216.  Recent Hemoglobin A1c 7.7 - SSI -Resume home glipizide -Hold home Metformin  Incidental findings pulmonary nodules- There are occasional small pulmonary nodules, which are new or enlarged when compared to prior CT. A nodule of the right pulmonary apex measures 7 mm, previously 3 mm when measured similarly (series 7, image 24). A new nodule of the right lower lobe measures 6 mm (series 7, image 84). These are most likely infectious or inflammatory; pulmonary nodules are an unlikely manifestation of pulmonary involvement of lymphoma.  -Follow-up as outpatient  DVT prophylaxis: SCDs Code Status: Full code Family Communication: none today Disposition Plan: > 2 days Consults called: Hematology Admission status: Inpatient, stepdown.   Subjective: Overnight pt had to receive ativan for sedation in order to tolerate BiPAP.  Due to sedation, pt was not able to respond to questions.  Objective: Vitals:   08/06/19 2300 08/06/19 2350 08/07/19 0100 08/07/19 0145  BP: 130/76  138/69    Pulse: 99  98   Resp: 19  (!) 25   Temp:  98.1 F (36.7 C)    TempSrc:  Oral    SpO2: 99%  94% 98%  Weight:      Height:        Intake/Output Summary (Last 24 hours) at 08/07/2019 0424 Last data filed at 08/06/2019 2200 Gross per 24 hour  Intake 1527.5 ml  Output 1000 ml  Net 527.5 ml   Filed Weights   08/05/19 0837  Weight: 103 kg    Examination:  Constitutional: NAD, sedated  HEENT: conjunctivae and lids normal, pupils round, equal and reactive CV: RRR. Distal pulses +2.  No cyanosis.   RESP: on BiPAP  GI: +BS, NTND Extremities: No effusions, edema, or tenderness in BLE SKIN: warm, dry and intact   Data Reviewed: I have personally reviewed following labs and imaging studies  CBC: Recent Labs  Lab 08/01/19 0556 08/01/19 1403 08/02/19 0432 08/03/19 1300 08/05/19 0839 08/06/19 0753  WBC 21.1*  --  21.3* 17.0* 39.8* 28.3*  NEUTROABS  --   --   --  0.6* 0.9*  --   HGB 7.1* 8.0* 8.1* 7.4* 8.2* 6.5*  HCT 23.4* 26.1* 26.2* 24.2* 28.7* 22.6*  MCV 95.1  --  96.3 94.2 100.0 100.9*  PLT 40*  --  37* 32* 40* 27*   Basic Metabolic Panel: Recent Labs  Lab 07/31/19 1637 08/02/19 0432 08/05/19 0839 08/06/19 0753  NA 136 137 140 141  K 4.3 4.5 4.8 5.2*  CL 97* 94* 93* 98  CO2 30 33* 33* 35*  GLUCOSE 151* 156* 216* 106*  BUN 28* 26* 26* 26*  CREATININE 0.83 0.77 0.69 0.73  CALCIUM 8.6* 9.1 9.3 8.4*   GFR: Estimated Creatinine Clearance: 134.3 mL/min (by C-G formula based on SCr of 0.73 mg/dL). Liver Function Tests: No results for input(s): AST, ALT, ALKPHOS, BILITOT, PROT, ALBUMIN in the last 168 hours. No results for input(s): LIPASE, AMYLASE in the last 168 hours. No results for input(s): AMMONIA in the last 168 hours. Coagulation Profile: No results for input(s): INR, PROTIME in the last 168 hours. Cardiac Enzymes: No results for input(s): CKTOTAL, CKMB, CKMBINDEX, TROPONINI in the last 168 hours. BNP (last 3 results) No results for input(s): PROBNP  in the last 8760 hours. HbA1C: No results for input(s): HGBA1C in the last 72 hours. CBG: Recent Labs  Lab 08/06/19 0740 08/06/19 1145 08/06/19 1646 08/06/19 2028 08/06/19 2345  GLUCAP 117* 115* 110* 104* 111*   Lipid Profile: No results for input(s): CHOL, HDL, LDLCALC, TRIG, CHOLHDL, LDLDIRECT in the last 72 hours. Thyroid Function Tests: No results for input(s): TSH, T4TOTAL, FREET4, T3FREE, THYROIDAB in the last 72 hours. Anemia Panel: Recent Labs    08/06/19 1348  VITAMINB12 168*  RETICCTPCT 1.1   Sepsis Labs: Recent Labs  Lab 08/02/19 0432 08/05/19 0839 08/05/19 1648  PROCALCITON <0.10 <0.10  --   LATICACIDVEN  --   --  0.4*    Recent Results (from the past 240 hour(s))  SARS CORONAVIRUS 2 (TAT 6-24 HRS) Nasopharyngeal Per Rectum     Status: None   Collection Time: 07/31/19  3:20 AM   Specimen: Per Rectum; Nasopharyngeal  Result Value Ref Range Status   SARS Coronavirus 2 NEGATIVE NEGATIVE Final    Comment: (NOTE) SARS-CoV-2 target nucleic acids are NOT DETECTED. The SARS-CoV-2 RNA is  generally detectable in upper and lower respiratory specimens during the acute phase of infection. Negative results do not preclude SARS-CoV-2 infection, do not rule out co-infections with other pathogens, and should not be used as the sole basis for treatment or other patient management decisions. Negative results must be combined with clinical observations, patient history, and epidemiological information. The expected result is Negative. Fact Sheet for Patients: SugarRoll.be Fact Sheet for Healthcare Providers: https://www.woods-mathews.com/ This test is not yet approved or cleared by the [place] FDA and  has been authorized for detection and/or diagnosis of SARS-CoV-2 by FDA under an Emergency Use Authorization (EUA). This EUA will remain  in effect (meaning this test can be used) for the duration of the COVID-19  declaration under Section 56 4(b)(1) of the Act, 21 U.S.C. section 360bbb-3(b)(1), unless the authorization is terminated or revoked sooner. Performed at [facility], [place] [address], [place], [place] [address]   Respiratory Panel by RT PCR (Flu A&B, Covid) - Nasopharyngeal Swab     Status: None   Collection Time: 08/05/19 11:50 AM   Specimen: Nasopharyngeal Swab  Result Value Ref Range Status   SARS Coronavirus 2 by RT PCR NEGATIVE NEGATIVE Final    Comment: (NOTE) SARS-CoV-2 target nucleic acids are NOT DETECTED. The SARS-CoV-2 RNA is generally detectable in upper respiratoy specimens during the acute phase of infection. The lowest concentration of SARS-CoV-2 viral copies this assay can detect is 131 copies/mL. A negative result does not preclude SARS-Cov-2 infection and should not be used as the sole basis for treatment or other patient management decisions. A negative result may occur with  improper specimen collection/handling, submission of specimen other than nasopharyngeal swab, presence of viral mutation(s) within the areas targeted by this assay, and inadequate number of viral copies (<131 copies/mL). A negative result must be combined with clinical observations, patient history, and epidemiological information. The expected result is Negative. Fact Sheet for Patients:  PinkCheek.be Fact Sheet for Healthcare Providers:  GravelBags.it This test is not yet ap proved or cleared by the [place] FDA and  has been authorized for detection and/or diagnosis of SARS-CoV-2 by FDA under an Emergency Use Authorization (EUA). This EUA will remain  in effect (meaning this test can be used) for the duration of the COVID-19 declaration under Section 564(b)(1) of the Act, 21 U.S.C. section 360bbb-3(b)(1), unless the authorization is terminated or revoked sooner.    Influenza A by PCR NEGATIVE NEGATIVE Final   Influenza  B by PCR NEGATIVE NEGATIVE Final    Comment: (NOTE) The Xpert Xpress SARS-CoV-2/FLU/RSV assay is intended as an aid in  the diagnosis of influenza from Nasopharyngeal swab specimens and  should not be used as a sole basis for treatment. Nasal washings and  aspirates are unacceptable for Xpert Xpress SARS-CoV-2/FLU/RSV  testing. Fact Sheet for Patients: PinkCheek.be Fact Sheet for Healthcare Providers: GravelBags.it This test is not yet approved or cleared by the [place] FDA and  has been authorized for detection and/or diagnosis of SARS-CoV-2 by  FDA under an Emergency Use Authorization (EUA). This EUA will remain  in effect (meaning this test can be used) for the duration of the  Covid-19 declaration under Section 564(b)(1) of the Act, 21  U.S.C. section 360bbb-3(b)(1), unless the authorization is  terminated or revoked. Performed at [facility], [place] [address] [place],  [address]   MRSA PCR Screening     Status: None   Collection Time: 08/05/19 12:15 PM   Specimen:  Nasal Mucosa; Nasopharyngeal  Result Value Ref Range Status   MRSA by PCR NEGATIVE NEGATIVE Final    Comment:        The GeneXpert MRSA Assay (FDA approved for NASAL specimens only), is one component of a comprehensive MRSA colonization surveillance program. It is not intended to diagnose MRSA infection nor to guide or monitor treatment for MRSA infections. Performed at [facility], [address], [place], [place] [address]          Radiology Studies: CT Angio Chest PE W/Cm &/Or Wo Cm  Result Date: 08/05/2019 CLINICAL DATA:  Shortness of breath, CLL EXAM: CT ANGIOGRAPHY CHEST WITH CONTRAST TECHNIQUE: Multidetector CT imaging of the chest was performed using the standard protocol during bolus administration of intravenous contrast. Multiplanar CT image reconstructions and MIPs were obtained to evaluate the vascular anatomy.  CONTRAST:  193m OMNIPAQUE IOHEXOL 350 MG/ML SOLN COMPARISON:  CT chest abdomen pelvis, 04/30/2011 FINDINGS: Cardiovascular: Satisfactory opacification of the pulmonary arteries to the segmental level. No evidence of pulmonary embolism. Normal heart size. No pericardial effusion. Mediastinum/Nodes: Numerous bulky bilateral axillary, lower cervical, mediastinal, and hilar lymph nodes, similar in size compared to CT dated 04/30/2019. Thyroid gland, trachea, and esophagus demonstrate no significant findings. Lungs/Pleura: Small bilateral pleural effusions. Diffuse bilateral interlobular septal thickening and scattered ground-glass opacities throughout the lungs. There are occasional small pulmonary nodules, which are new or enlarged when compared to prior CT. A nodule of the right pulmonary apex measures 7 mm, previously 3 mm when measured similarly (series 7, image 24). A new nodule of the right lower lobe measures 6 mm (series 7, image 84). Upper Abdomen: No acute abnormality. There is redemonstrated bulky celiac axis and gastrohepatic ligament lymphadenopathy, again similar to prior examination dated 04/30/2019 to the extent imaged. Musculoskeletal: No chest wall abnormality. No acute or significant osseous findings. Review of the MIP images confirms the above findings. IMPRESSION: 1.  Negative examination for pulmonary embolism. 2. Small bilateral pleural effusions with diffuse bilateral interlobular septal thickening and scattered ground-glass opacity throughout the lungs. These findings are consistent with infection or edema. 3. There are occasional small pulmonary nodules, which are new or enlarged when compared to prior CT. A nodule of the right pulmonary apex measures 7 mm, previously 3 mm when measured similarly (series 7, image 24). A new nodule of the right lower lobe measures 6 mm (series 7, image 84). These are most likely infectious or inflammatory; pulmonary nodules are an unlikely manifestation of  pulmonary involvement of lymphoma. 4. Numerous bulky bilateral axillary, lower cervical, mediastinal, and hilar lymph nodes, as well as bulky lymph nodes in the partially imaged upper abdomen, similar in size compared to CT dated 04/30/2019 and in keeping with diagnosis of CLL. Electronically Signed   By: AEddie CandleM.D.   On: 08/05/2019 15:26   CT ABDOMEN PELVIS W CONTRAST  Result Date: 08/06/2019 CLINICAL DATA:  CLL/SLL. EXAM: CT ABDOMEN AND PELVIS WITH CONTRAST TECHNIQUE: Multidetector CT imaging of the abdomen and pelvis was performed using the standard protocol following bolus administration of intravenous contrast. CONTRAST:  1032mOMNIPAQUE IOHEXOL 300 MG/ML  SOLN COMPARISON:  04/30/2019 FINDINGS: Lower chest: There are moderate to large bilateral pleural effusions that are only partially visualized on this exam.The heart size is borderline enlarged. There are few ground-glass airspace opacities at the lung bases with interlobular septal thickening suggestive of volume overload/developing pulmonary edema. Hepatobiliary: The liver is normal. There is layering hyperdense material within the gallbladder lumen. This may represent  gallbladder sludge or vicarious excretion of contrast from the patient's prior contrast enhanced CT.There is no biliary ductal dilation. Pancreas: Normal contours without ductal dilatation. No peripancreatic fluid collection. Spleen: The spleen is significantly enlarged Adrenals/Urinary Tract: --Adrenal glands: No adrenal hemorrhage. --Right kidney/ureter: No hydronephrosis or perinephric hematoma. --Left kidney/ureter: No hydronephrosis or perinephric hematoma. --Urinary bladder: Unremarkable. Stomach/Bowel: --Stomach/Duodenum: No hiatal hernia or other gastric abnormality. Normal duodenal course and caliber. --Small bowel: No dilatation or inflammation. --Colon: No focal abnormality. --Appendix: Normal. Vascular/Lymphatic: Atherosclerotic calcification is present within the  non-aneurysmal abdominal aorta, without hemodynamically significant stenosis. --there is extensive retroperitoneal adenopathy --there is extensive mesenteric adenopathy --there is extensive pelvic and inguinal adenopathy. Overall, the adenopathy appears to be relatively stable to slightly improved when compared to prior study. Reproductive: Unremarkable Other: No ascites or free air. The abdominal wall is normal. Musculoskeletal. 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 There is no acute displaced fracture. IMPRESSION: 1. Moderate to large bilateral pleural effusions with adjacent atelectasis. 2. Extensive adenopathy throughout the abdomen and pelvis, similar to slightly improved when compared to September 2020 CT. 3. Splenomegaly. 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 Aortic Atherosclerosis (ICD10-I70.0). Electronically Signed   By: Constance Holster M.D.   On: 08/06/2019 22:34   DG Chest Port 1 View  Result Date: 08/05/2019 CLINICAL DATA:  PT c/o worsening SOB since last night. Pt states he is normally somewhat SOB at baseline. Hx HTN, diabetes, COPD, asthma, former smoker EXAM: PORTABLE CHEST - 1 VIEW COMPARISON:  08/02/2019 FINDINGS: Increasing patchy airspace infiltrate laterally at the left lung base. Diffuse interstitial markings throughout both lungs as before. Heart size normal. Right paratracheal soft tissue consistent with adenopathy. The aortopulmonary window is obscured suggesting adenopathy or mass. Blunting of left lateral costophrenic angle. No pneumothorax. Visualized bones unremarkable. IMPRESSION: 1. Increasing patchy airspace infiltrate laterally at the left lung base suspicious for pneumonia. 2. Probable mediastinal adenopathy. 3. Diffuse interstitial markings throughout both lungs as before. Electronically Signed   By: Lucrezia Europe M.D.   On: 08/05/2019 09:12        Scheduled Meds: . atorvastatin  40 mg Oral q1800  .  budesonide  0.5 mg Inhalation BID  . Chlorhexidine Gluconate Cloth  6 each Topical Daily  . escitalopram  20 mg Oral Daily  . glipiZIDE  5 mg Oral BID WC  . insulin aspart  0-9 Units Subcutaneous Q4H   Continuous Infusions: . azithromycin 500 mg (08/06/19 1807)  . ceFEPime (MAXIPIME) IV 2 g (08/06/19 2315)     LOS: 2 days    Enzo Bi, MD [facility] If 7PM-7AM, please contact night-coverage 08/07/2019, 4:24 AM

## 2019-08-06 NOTE — Progress Notes (Signed)
[facility] transport arranged for Monday (12/14) morning for the patient to be at [facility] by 0700.

## 2019-08-06 NOTE — Progress Notes (Signed)
CRITICAL VALUE ALERT  Critical Value:  CO2- 90.2  Date & Time Notied:  08/06/19, 2205  Provider Notified: Lennox Grumbles   Orders Received/Actions taken:

## 2019-08-06 NOTE — Progress Notes (Signed)
CRITICAL VALUE ALERT  Critical Value:  platelets 27  HG 6.5 Date & Time Notied:  08/06/2019 0820  Provider Notified: Dr. Billie Ruddy  Orders Received/Actions taken: awaiting orders

## 2019-08-06 NOTE — Progress Notes (Signed)
Dr. Darrick Meigs paged and made aware of pt continuously trying to get OOB and pull off Bipap. CO2 critical at 90.5. New order for Ativan 1mg  to be given. Will continue to monitor pt

## 2019-08-06 NOTE — Progress Notes (Signed)
CRITICAL VALUE ALERT  Critical Value:  PCO2 90.5 critical   Date & Time Notied:  08/06/19 @ 0611  Provider Notified: Dr. Darrick Meigs  Orders Received/Actions taken: Waiting for call back/orders

## 2019-08-06 NOTE — Progress Notes (Signed)
Patient transported to CT on 6 lpm HFNC. Patient was alert and awake and talking upon arrival back to ICU. ABG drawn to check patients respiratory status. Patient's vitals remained stable throughout trip and no adverse events noted.

## 2019-08-06 NOTE — Progress Notes (Signed)
Patient requested to be taken off BIPAP. RN placed patient back on nasal cannula and spoke with midlevel. Patient requesting water and agreeable to go back on BIPAP later.

## 2019-08-06 NOTE — Progress Notes (Signed)
Patient's ABG has not changed since this morning even though he has been on BIPAP constantly all day today. Patient placed back on BIPAP at this time. Patient still alert and agreeable to going back on machine.

## 2019-08-06 NOTE — Progress Notes (Signed)
Patient suffers from chronic COPD, New CLL/CML which impairs their ability to perform daily activities like in the home. Crutches will not resolve issue with performing activities of daily living. A wheelchair will allow patient to safely perform daily activities. Patient can safely propel the wheelchair in the home or has a caregiver who can provide assistance. Length of need 99 Months.

## 2019-08-06 NOTE — Progress Notes (Signed)
Dr Darrick Meigs paged and made aware that patient trying to get OOB and pull off bipap. Paged to see if Ativan can be given Waiting for orders/call back. Will continue to monitor pt

## 2019-08-07 ENCOUNTER — Inpatient Hospital Stay (HOSPITAL_COMMUNITY): Payer: Medicare Other

## 2019-08-07 ENCOUNTER — Inpatient Hospital Stay
Admission: AD | Admit: 2019-08-07 | Payer: Medicare Other | Source: Other Acute Inpatient Hospital | Admitting: Pulmonary Disease

## 2019-08-07 ENCOUNTER — Other Ambulatory Visit: Payer: Self-pay

## 2019-08-07 DIAGNOSIS — J189 Pneumonia, unspecified organism: Secondary | ICD-10-CM

## 2019-08-07 DIAGNOSIS — C9110 Chronic lymphocytic leukemia of B-cell type not having achieved remission: Secondary | ICD-10-CM

## 2019-08-07 DIAGNOSIS — I950 Idiopathic hypotension: Secondary | ICD-10-CM | POA: Diagnosis present

## 2019-08-07 DIAGNOSIS — J9622 Acute and chronic respiratory failure with hypercapnia: Secondary | ICD-10-CM

## 2019-08-07 DIAGNOSIS — J9600 Acute respiratory failure, unspecified whether with hypoxia or hypercapnia: Secondary | ICD-10-CM

## 2019-08-07 DIAGNOSIS — D696 Thrombocytopenia, unspecified: Secondary | ICD-10-CM | POA: Diagnosis present

## 2019-08-07 DIAGNOSIS — J9621 Acute and chronic respiratory failure with hypoxia: Secondary | ICD-10-CM

## 2019-08-07 DIAGNOSIS — D519 Vitamin B12 deficiency anemia, unspecified: Secondary | ICD-10-CM | POA: Diagnosis present

## 2019-08-07 DIAGNOSIS — I469 Cardiac arrest, cause unspecified: Secondary | ICD-10-CM

## 2019-08-07 LAB — BPAM RBC
Blood Product Expiration Date: 202012232359
ISSUE DATE / TIME: 202012111609
Unit Type and Rh: 9500

## 2019-08-07 LAB — CBC
HCT: 23.4 % — ABNORMAL LOW (ref 39.0–52.0)
HCT: 30.3 % — ABNORMAL LOW (ref 39.0–52.0)
Hemoglobin: 7.2 g/dL — ABNORMAL LOW (ref 13.0–17.0)
Hemoglobin: 8.5 g/dL — ABNORMAL LOW (ref 13.0–17.0)
MCH: 30.1 pg (ref 26.0–34.0)
MCH: 28.4 pg (ref 26.0–34.0)
MCHC: 30.8 g/dL (ref 30.0–36.0)
MCHC: 28.1 g/dL — ABNORMAL LOW (ref 30.0–36.0)
MCV: 97.9 fL (ref 80.0–100.0)
MCV: 101.3 fL — ABNORMAL HIGH (ref 80.0–100.0)
Platelets: 22 10*3/uL — CL (ref 150–400)
Platelets: 40 10*3/uL — ABNORMAL LOW (ref 150–400)
RBC: 2.39 MIL/uL — ABNORMAL LOW (ref 4.22–5.81)
RBC: 2.99 MIL/uL — ABNORMAL LOW (ref 4.22–5.81)
RDW: 17.3 % — ABNORMAL HIGH (ref 11.5–15.5)
RDW: 17.6 % — ABNORMAL HIGH (ref 11.5–15.5)
WBC: 26.1 10*3/uL — ABNORMAL HIGH (ref 4.0–10.5)
WBC: 97.2 10*3/uL (ref 4.0–10.5)
nRBC: 0.4 % — ABNORMAL HIGH (ref 0.0–0.2)
nRBC: 0.4 % — ABNORMAL HIGH (ref 0.0–0.2)

## 2019-08-07 LAB — BLOOD GAS, ARTERIAL
Acid-Base Excess: 14.2 mmol/L — ABNORMAL HIGH (ref 0.0–2.0)
Acid-Base Excess: 10.9 mmol/L — ABNORMAL HIGH (ref 0.0–2.0)
Bicarbonate: 36.8 mmol/L — ABNORMAL HIGH (ref 20.0–28.0)
Bicarbonate: 34.2 mmol/L — ABNORMAL HIGH (ref 20.0–28.0)
FIO2: 45
FIO2: 70
O2 Saturation: 98.2 %
O2 Saturation: 99.9 %
Patient temperature: 36.2
Patient temperature: 36.8
pCO2 arterial: 87.3 mmHg (ref 32.0–48.0)
pCO2 arterial: 69.6 mmHg (ref 32.0–48.0)
pH, Arterial: 7.295 — ABNORMAL LOW (ref 7.350–7.450)
pH, Arterial: 7.344 — ABNORMAL LOW (ref 7.350–7.450)
pO2, Arterial: 115 mmHg — ABNORMAL HIGH (ref 83.0–108.0)
pO2, Arterial: 190 mmHg — ABNORMAL HIGH (ref 83.0–108.0)

## 2019-08-07 LAB — BASIC METABOLIC PANEL
Anion gap: 11 (ref 5–15)
Anion gap: 13 (ref 5–15)
BUN: 27 mg/dL — ABNORMAL HIGH (ref 6–20)
BUN: 29 mg/dL — ABNORMAL HIGH (ref 6–20)
CO2: 36 mmol/L — ABNORMAL HIGH (ref 22–32)
CO2: 34 mmol/L — ABNORMAL HIGH (ref 22–32)
Calcium: 8.7 mg/dL — ABNORMAL LOW (ref 8.9–10.3)
Calcium: 8.9 mg/dL (ref 8.9–10.3)
Chloride: 93 mmol/L — ABNORMAL LOW (ref 98–111)
Chloride: 94 mmol/L — ABNORMAL LOW (ref 98–111)
Creatinine, Ser: 0.71 mg/dL (ref 0.61–1.24)
Creatinine, Ser: 1 mg/dL (ref 0.61–1.24)
GFR calc Af Amer: >60 mL/min (ref >60)
GFR calc Af Amer: >60 mL/min (ref >60)
GFR calc non Af Amer: >60 mL/min (ref >60)
GFR calc non Af Amer: >60 mL/min (ref >60)
Glucose, Bld: 90 mg/dL (ref 70–99)
Glucose, Bld: 122 mg/dL — ABNORMAL HIGH (ref 70–99)
Potassium: 4.7 mmol/L (ref 3.5–5.1)
Potassium: 4 mmol/L (ref 3.5–5.1)
Sodium: 140 mmol/L (ref 135–145)
Sodium: 141 mmol/L (ref 135–145)

## 2019-08-07 LAB — LACTIC ACID, PLASMA
Lactic Acid, Venous: 3.3 mmol/L (ref 0.5–1.9)
Lactic Acid, Venous: 1.2 mmol/L (ref 0.5–1.9)

## 2019-08-07 LAB — TYPE AND SCREEN
ABO/RH(D): O POS
Antibody Screen: NEGATIVE
Unit division: 0

## 2019-08-07 LAB — URINALYSIS, ROUTINE W REFLEX MICROSCOPIC
Bacteria, UA: NONE SEEN
Bilirubin Urine: NEGATIVE
Glucose, UA: NEGATIVE mg/dL
Ketones, ur: 5 mg/dL — AB
Leukocytes,Ua: NEGATIVE
Nitrite: NEGATIVE
Protein, ur: 30 mg/dL — AB
Specific Gravity, Urine: 1.023 (ref 1.005–1.030)
pH: 5 (ref 5.0–8.0)

## 2019-08-07 LAB — GLUCOSE, CAPILLARY
Glucose-Capillary: 100 mg/dL — ABNORMAL HIGH (ref 70–99)
Glucose-Capillary: 117 mg/dL — ABNORMAL HIGH (ref 70–99)
Glucose-Capillary: 118 mg/dL — ABNORMAL HIGH (ref 70–99)
Glucose-Capillary: 139 mg/dL — ABNORMAL HIGH (ref 70–99)
Glucose-Capillary: 177 mg/dL — ABNORMAL HIGH (ref 70–99)
Glucose-Capillary: 88 mg/dL (ref 70–99)
Glucose-Capillary: 92 mg/dL (ref 70–99)
Glucose-Capillary: 97 mg/dL (ref 70–99)

## 2019-08-07 LAB — TROPONIN I (HIGH SENSITIVITY)
Troponin I (High Sensitivity): 4 ng/L (ref <18)
Troponin I (High Sensitivity): 8 ng/L (ref <18)

## 2019-08-07 LAB — MAGNESIUM
Magnesium: 2.2 mg/dL (ref 1.7–2.4)
Magnesium: 2.6 mg/dL — ABNORMAL HIGH (ref 1.7–2.4)

## 2019-08-07 LAB — HAPTOGLOBIN: Haptoglobin: 432 mg/dL — ABNORMAL HIGH (ref 23–355)

## 2019-08-07 MED ORDER — FENTANYL CITRATE (PF) 100 MCG/2ML IJ SOLN
50.0000 ug | INTRAMUSCULAR | Status: DC | PRN
  Administered 2019-08-08 – 2019-08-09 (×11): 100 ug via INTRAVENOUS
  Filled 2019-08-07 (×12): qty 2

## 2019-08-07 MED ORDER — LORAZEPAM 2 MG/ML IJ SOLN
2.0000 mg | Freq: Once | INTRAMUSCULAR | Status: AC
  Administered 2019-08-07: 2 mg via INTRAVENOUS
  Filled 2019-08-07: qty 1

## 2019-08-07 MED ORDER — SODIUM CHLORIDE 0.9% FLUSH: 10.0000 mL | INTRAVENOUS | Status: DC | PRN

## 2019-08-07 MED ORDER — PHENYLEPHRINE HCL-NACL 10-0.9 MG/250ML-% IV SOLN
25.0000 ug/min | INTRAVENOUS | Status: DC
  Administered 2019-08-07: 25 ug/min via INTRAVENOUS
  Filled 2019-08-07 (×2): qty 250

## 2019-08-07 MED ORDER — FENTANYL CITRATE (PF) 100 MCG/2ML IJ SOLN
50.0000 ug | INTRAMUSCULAR | Status: AC | PRN
  Administered 2019-08-07 – 2019-08-08 (×3): 50 ug via INTRAVENOUS
  Filled 2019-08-07 (×2): qty 2

## 2019-08-07 MED ORDER — PROPOFOL 1000 MG/100ML IV EMUL
0.0000 ug/kg/min | INTRAVENOUS | Status: DC
  Administered 2019-08-07 (×2): 20 ug/kg/min via INTRAVENOUS
  Administered 2019-08-08: 06:00:00 50 ug/kg/min via INTRAVENOUS
  Administered 2019-08-08 (×2): 40 ug/kg/min via INTRAVENOUS
  Administered 2019-08-08: 09:00:00 50 ug/kg/min via INTRAVENOUS
  Administered 2019-08-08: 22:00:00 40 ug/kg/min via INTRAVENOUS
  Administered 2019-08-08 – 2019-08-09 (×3): 50 ug/kg/min via INTRAVENOUS
  Administered 2019-08-09: 01:00:00 40 ug/kg/min via INTRAVENOUS
  Administered 2019-08-09: 07:00:00 50 ug/kg/min via INTRAVENOUS
  Filled 2019-08-07 (×12): qty 100

## 2019-08-07 MED ORDER — PROPOFOL 1000 MG/100ML IV EMUL
INTRAVENOUS | Status: AC
  Filled 2019-08-07: qty 100

## 2019-08-07 MED ORDER — SODIUM CHLORIDE 0.9 % IV SOLN
INTRAVENOUS | Status: DC | PRN
  Administered 2019-08-07: 17:00:00 via INTRA_ARTERIAL

## 2019-08-07 MED ORDER — CHLORHEXIDINE GLUCONATE CLOTH 2 % EX PADS
6.0000 | MEDICATED_PAD | Freq: Every day | CUTANEOUS | Status: DC
  Administered 2019-08-08 – 2019-08-11 (×3): 6 via TOPICAL

## 2019-08-07 MED ORDER — SODIUM CHLORIDE 0.9% FLUSH
10.0000 mL | Freq: Two times a day (BID) | INTRAVENOUS | Status: DC
  Administered 2019-08-07 – 2019-08-13 (×10): 10 mL

## 2019-08-07 MED ORDER — SODIUM CHLORIDE 0.9 % IV SOLN: 250.0000 mL | INTRAVENOUS | Status: DC

## 2019-08-07 MED ORDER — PANTOPRAZOLE SODIUM 40 MG IV SOLR
40.0000 mg | INTRAVENOUS | Status: DC
  Administered 2019-08-07 – 2019-08-11 (×5): 40 mg via INTRAVENOUS
  Filled 2019-08-07 (×5): qty 40

## 2019-08-07 MED ORDER — SODIUM CHLORIDE 0.9% FLUSH
10.0000 mL | Freq: Two times a day (BID) | INTRAVENOUS | Status: DC
  Administered 2019-08-07: 20 mL
  Administered 2019-08-08 – 2019-08-13 (×10): 10 mL

## 2019-08-07 NOTE — Progress Notes (Signed)
Patient progressively agitated since 0345. Continuously removing bipap and attempting to get out of bed. MD aware.

## 2019-08-07 NOTE — H&P (Signed)
NAME:  Larry Lam, MRN:  [id number], DOB:  Feb 19, 1971, LOS: 2 ADMISSION DATE:  08/05/2019, CONSULTATION DATE: 08/07/2019 REFERRING MD: TRH, CHIEF COMPLAINT: Status post arrest  Brief History   48 year old male with COPD and CLL status post arrest  History of present illness   Patient is a 48 year old recently admitted to the hospital the first week of December with symptoms of generalized weakness felt secondary to acute on chronic anemia due to his CLL.  Patient presented back to the emergency room at [facility] with respiratory distress O2 saturation was 40% on 3 L and was treated as a COPD exacerbation.  Patient is chronically on 2 L nasal cannula.  Chest x-ray showed probable left lower lobe pneumonia with a negative Covid and influenza test.  He was darted on vancomycin and cefepime and seem to be showing serial clinical improvement.  He did have symptoms issues with encephalopathy and confusion.  About 1:00 this afternoon patient can continue use and removed his nasal cannula and CODE BLUE was called.  He received 2 doses of epinephrine serial bicarbonate and CPR.  He was found by the emergency room physician to be bradycardic minimally responsive and this degenerated into a PEA arrest.  He was aggressively resuscitated.  At time of transfer patient is intubated with a central line.   Past Medical History   . Asthma   . COPD (chronic obstructive pulmonary disease) ([place])   . Diabetes mellitus without complication ([place])   . Hypertension   . Seizures ([place])    3-4 years ago. Single episode.  . Tobacco use      Significant Hospital Events   Admitted to [facility] 08/05/2019 PEA arrest 08/07/2019 secondary to hypoxemia Transferred to [facility] 08/15/2019  Consults:  PCCM  Procedures:  Intubation central line arterial line 08/07/2019 at [facility]  Significant Diagnostic Tests:  NA  Micro Data:  12/10 Sars-CoV-2>>neg 12/10 Influenza  A/B>>neg  Antimicrobials:  Azithromycin and cefepime  Interim history/subjective:  NA  Objective   Blood pressure (!) 113/59, pulse 73, temperature 98.3 F (36.8 C), resp. rate 20, height 5\' 10"  (1.778 m), weight 101.3 kg, SpO2 100 %.    Vent Mode: PRVC FiO2 (%):  [45 %-70 %] 70 % Set Rate:  [12 bmp-20 bmp] 20 bmp Vt Set:  [580 mL] 580 mL PEEP:  [5 cmH20-8 cmH20] 5 cmH20 Pressure Support:  [8 cmH20] 8 cmH20 Plateau Pressure:  [31 cmH20] 31 cmH20   Intake/Output Summary (Last 24 hours) at 08/07/2019 1852 Last data filed at 08/07/2019 1800 Gross per 24 hour  Intake 443.78 ml  Output 1000 ml  Net -556.22 ml   Filed Weights   08/05/19 0837 08/07/19 0400  Weight: 103 kg 101.3 kg    Examination: General: intubated sedated white male HENT: Intubated Lungs: clear  Cardiovascular:Reg rate rhythm no murmurs Abdomen: Benign bs+ Extremities: WNL Neuro: Sedated GU: N/A  Resolved Hospital Problem list   NA  Assessment & Plan:  1.  SP PEA arrest:  Neurologic exam flat now but only requiring minimal neo drip.  Outside criteria and window for cooling protocol.    2. Respiratory failure on Vent:  Vent management per protocol with continuous sedation  3. Bilateral lower lobe infiltrates:  PNA vs pulmonary edema, culture sputum and continue current broad spectrum antibiotics.  4.  Previous noted encephalopathy:  Monitor once sedation decreased.  5.  CLL:  Has had associated anemia with this.  Treatment currently not a consideration unless he  clinically recovers.  Transfuse prn.  6.  Recently noted subcentimeter pulmonary nodules of unclear significance:  Readdress if clinically improves    Best practice:  Diet: N.p.o. Pain/Anxiety/Delirium protocol (if indicated): Continue with sedation VAP protocol (if indicated): Yes DVT prophylaxis: Yes GI prophylaxis: Yes Glucose control: Monitor Mobility: Bedrest Code Status: Full Family Communication: Not available Disposition:  To [facility], ICU for further therapy  Labs   CBC: Recent Labs  Lab 08/03/19 1300 08/05/19 0839 08/06/19 0753 08/07/19 1141 08/07/19 1401  WBC 17.0* 39.8* 28.3* 26.1* 97.2*  NEUTROABS 0.6* 0.9*  --   --   --   HGB 7.4* 8.2* 6.5* 7.2* 8.5*  HCT 24.2* 28.7* 22.6* 23.4* 30.3*  MCV 94.2 100.0 100.9* 97.9 101.3*  PLT 32* 40* 27* 22* 40*    Basic Metabolic Panel: Recent Labs  Lab 08/02/19 0432 08/05/19 0839 08/06/19 0753 08/07/19 1141 08/07/19 1401  NA 137 140 141 140 141  K 4.5 4.8 5.2* 4.7 4.0  CL 94* 93* 98 93* 94*  CO2 33* 33* 35* 36* 34*  GLUCOSE 156* 216* 106* 90 122*  BUN 26* 26* 26* 27* 29*  CREATININE 0.77 0.69 0.73 0.71 1.00  CALCIUM 9.1 9.3 8.4* 8.7* 8.9  MG  --   --   --  2.2 2.6*   GFR: Estimated Creatinine Clearance: 106.5 mL/min (by C-G formula based on SCr of 1 mg/dL). Recent Labs  Lab 08/02/19 0432 08/05/19 0839 08/05/19 1648 08/06/19 0753 08/07/19 1141 08/07/19 1401 08/07/19 1728  PROCALCITON <0.10 <0.10  --   --   --   --   --   WBC 21.3* 39.8*  --  28.3* 26.1* 97.2*  --   LATICACIDVEN  --   --  0.4*  --   --  3.3* 1.2    Liver Function Tests: No results for input(s): AST, ALT, ALKPHOS, BILITOT, PROT, ALBUMIN in the last 168 hours. No results for input(s): LIPASE, AMYLASE in the last 168 hours. No results for input(s): AMMONIA in the last 168 hours.  ABG    Component Value Date/Time   PHART 7.344 (L) 08/07/2019 1627   PCO2ART 69.6 (HH) 08/07/2019 1627   PO2ART 190 (H) 08/07/2019 1627   HCO3 34.2 (H) 08/07/2019 1627   O2SAT 99.9 08/07/2019 1627     Coagulation Profile: No results for input(s): INR, PROTIME in the last 168 hours.  Cardiac Enzymes: No results for input(s): CKTOTAL, CKMB, CKMBINDEX, TROPONINI in the last 168 hours.  HbA1C: Hemoglobin A1C  Date/Time Value Ref Range Status  07/06/2012 05:06 AM 10.6 (H) 4.2 - 6.3 % Final    Comment:    The American Diabetes Association recommends that a primary goal of therapy  should be <7% and that physicians should reevaluate the treatment regimen in patients with HbA1c values consistently >8%.    Hgb A1c MFr Bld  Date/Time Value Ref Range Status  07/31/2019 01:52 AM 7.7 (H) 4.8 - 5.6 % Final    Comment:    (NOTE) Pre diabetes:          5.7%-6.4% Diabetes:              >6.4% Glycemic control for   <7.0% adults with diabetes   04/28/2017 08:21 AM 8.9 (H) 4.8 - 5.6 % Final    Comment:    (NOTE) Pre diabetes:          5.7%-6.4% Diabetes:              >6.4% Glycemic control  for   <7.0% adults with diabetes     CBG: Recent Labs  Lab 08/07/19 0448 08/07/19 0746 08/07/19 1158 08/07/19 1340 08/07/19 1601  GLUCAP 100* 92 88 97 177*    Review of Systems:   Unable to obtain  Past Medical History  He,  has a past medical history of Asthma, COPD (chronic obstructive pulmonary disease) ([place]), Diabetes mellitus without complication ([place]), Hypertension, Seizures ([place]), and Tobacco use.   Surgical History   History reviewed. No pertinent surgical history.   Social History   reports that he quit smoking about 4 months ago. His smoking use included cigarettes. He has a 33.00 pack-year smoking history. He has quit using smokeless tobacco. He reports current alcohol use. He reports that he does not use drugs.   Family History   His family history includes Lung cancer in his maternal grandfather.   Allergies Allergies  Allergen Reactions  . Amlodipine Besylate-Valsartan Other (See Comments)    Lowered potassium  . Hydrochlorothiazide Other (See Comments)    Lowered potassium     Home Medications  Prior to Admission medications   Medication Sig Start Date End Date Taking? Authorizing Provider  albuterol (PROVENTIL HFA;VENTOLIN HFA) 108 (90 Base) MCG/ACT inhaler Inhale 2 puffs into the lungs every 6 (six) hours as needed for wheezing or shortness of breath. 04/29/17  Yes Tat, Shanon Brow, MD  ASPIRIN 81 PO Take 81 mg by mouth.   Yes [provider]  atorvastatin (LIPITOR) 40 MG tablet Take 1 tablet by mouth daily. 08/04/19  Yes [provider]  azithromycin (ZITHROMAX Z-PAK) 250 MG tablet Take 1 tablet (250 mg total) by mouth daily. 500mg  PO day 1, then 250mg  PO days 205 08/02/19  Yes Noemi Chapel, MD  escitalopram (LEXAPRO) 20 MG tablet Take 20 mg by mouth daily. 05/11/19  Yes [provider]  glipiZIDE (GLUCOTROL) 5 MG tablet Take 1 tablet (5 mg total) by mouth 2 (two) times daily. 04/29/17 08/05/19 Yes Tat, Shanon Brow, MD  ipratropium-albuterol (DUONEB) 0.5-2.5 (3) MG/3ML SOLN Take 3 mLs by nebulization every 6 (six) hours as needed. J44.9 and J44.1 04/29/17  Yes Tat, Shanon Brow, MD  lisinopril (PRINIVIL,ZESTRIL) 20 MG tablet Take 1 tablet (20 mg total) by mouth daily. 04/30/17  Yes Tat, Shanon Brow, MD  metFORMIN (GLUCOPHAGE) 500 MG tablet Take 1 tablet (500 mg total) by mouth 2 (two) times daily with a meal. Patient taking differently: Take 1,000 mg by mouth 2 (two) times daily with a meal.  04/29/17  Yes Tat, David, MD  umeclidinium bromide (INCRUSE ELLIPTA) 62.5 MCG/INH AEPB Inhale into the lungs. 04/06/19  Yes [provider]  vitamin B-12 (CYANOCOBALAMIN) 1000 MCG tablet Take 1 tablet (1,000 mcg total) by mouth daily. 08/02/19  Yes Earlie Server, MD  Fluticasone-Salmeterol (ADVAIR) 250-50 MCG/DOSE AEPB Inhale into the lungs. 04/06/19   [provider]     Critical care time: Over 35 minutes was spent in bedside evaluation chart review critical care planning

## 2019-08-07 NOTE — Progress Notes (Signed)
Attempted Aline placement. Unsuccessful x3

## 2019-08-07 NOTE — Op Note (Signed)
Patient:  Larry Lam  DOB:  Nov 05, 1970  MRN:  [id number]   Preop Diagnosis: Hypotension, ventilatory dependent respiratory failure  Postop Diagnosis: Same  Procedure: Central line placement  Surgeon: Aviva Signs, MD  Anes: Local  Indications: Patient is a 48 year old white male who underwent intubation for respiratory failure.  He is now hypotensive and is need of central venous access for pressors.  The risks and benefits of the procedure were explained to the patient's family, who gave informed consent for the patient as the patient was unable to.  A femoral catheter is being placed due to severe thrombocytopenia.  Procedure note: The procedure was performed at the bedside in ICU bed 7.  Sterile technique was performed.  The right groin was prepped and draped using usual sterile technique with ChloraPrep.  Surgical site confirmation was performed.  1% Xylocaine was used for local anesthesia.  The right femoral vein was accessed using the Seldinger technique without difficulty.  A guidewire was then advanced into the right femoral vein without difficulty.  The tract was dilated and a triple-lumen catheter was inserted over the guidewire.  The guidewire was removed.  Good backflow of venous blood was noted on aspiration of all 3 ports.  All 3 ports were flushed with saline.  The central line was secured in the place using a 3-0 silk suture.  A dry sterile dressing was applied.  The patient tolerated the procedure well.  Complications: None  EBL: Minimal  Specimen: None

## 2019-08-07 NOTE — Progress Notes (Signed)
Patient had been taken off bipap for the possibility of eating. Wife had tried to get patient to eat and only consumed some ice-cream while wearing a nasal cannula which he had saturations of 92%.. I had to give another patient medication and when I came back to front desk patient had taken off his nasal cannula and saturations started to fall. I called respiratory and told her I would try to replace bipap but patient oxygen saturations had started dropping to fast for bipap to work. Called code and started bag mask ventilations with 15 lpm oxygen. Heart rate had started to drop and felt pulse ceased and PEA was declared. CPR started and Dr. Wynetta Emery and Dyann Kief arrived. ER doctor came to floor and we decided to intubate patient. He had received two rounds of epi and one sodium bicarb. After several minutes heartrate was felt and CPR stopped. Blood pressure was stable at this time but patient never became awake as he was earlier in the day. Due to being intubated patient started to become more aroused, propofol started  and arterial line attempted for blood pressure monitoring but failed. Dr. Arnoldo Morale was called to place a right femoral central arteriall line. Blood pressure was now 80 systolic and phenylephrine started. Foley placed for intake and output monitoring plus medication monitoring, OG tube placed. Patient is being sent to [facility] for further advanced care. As of this note no room has been assigned at [facility].

## 2019-08-07 NOTE — Progress Notes (Signed)
Verbal order given by Dr. Billie Ruddy to give pt a rest from Bipap and allow pt to eat if able. When pt is asleep will place BIPAP back on. Pt taken off BIPAP and placed on 4.5 L HFNC. Pt tolerating well and saturating 95%

## 2019-08-07 NOTE — Progress Notes (Signed)
Patient transported via [facility] to [facility], report given to [place]. Patient in stable condition. Report also given to accepting RN, Yvone Neu, at [facility]. Patient's wife, Tammie, aware of transfer as well.

## 2019-08-07 NOTE — Progress Notes (Signed)
CRITICAL VALUE ALERT  Critical Value:  Platelets 22  Date & Time Notied: 08/07/2019 1220  Provider Notified:  Dr. Billie Ruddy  Orders Received/Actions taken: none at this time

## 2019-08-07 NOTE — Consult Note (Signed)
[facility] Consultation Oncology  Name: Larry Lam      MRN: [id number]    Location: IC07/IC07-01  Date: 08/07/2019 Time:5:18 PM   REFERRING PHYSICIAN: Dr. Billie Ruddy  REASON FOR CONSULT: Severe anemia and thrombocytopenia.   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  HISTORY OF PRESENT ILLNESS: Larry Lam is a 48 year old white male seen in consultation today at the request of Dr. Billie Ruddy for severe Anemia and thrombocytopenia.  He had history of CLL which was diagnosed in August 2020 when he noticed enlarged neck lymph nodes for few weeks.  He had a prolonged history of mediastinal and hilar lymphadenopathy since June 2010.  Peripheral blood flow cytometry in August 2020 showed CD5 positive, CD23 positive lymphocytes consistent with CLL.  He is being closely observed since then.  He presented on 10/06/2018 with a hemoglobin of 6.5 and MCV of 100.9.  Platelet count was 27.  White count was 28.3.  Prior to this admission he came to the hospital on 07/31/2019 with a hemoglobin of 4.2, white count of 17.7 and platelet count of 37.  At that time he was found to have low B12 of 144.  Ferritin was normal.  Folic acid was normal.  LDH was also normal.  He did receive transfusions during the first admission.  He also has severe COPD.  Labs on current admission on 08/06/2019 showed reticulocyte count of 1.1%.  Direct Coombs test was negative.  B12 was found to be again low at 168.  Patient reportedly taking B12 1 mg daily as outpatient.  LDH was normal on repeat testing during this admission.  He was reportedly on BiPAP for his breathing.  However today afternoon he coded as he has self discontinued oxygen.  He had to receive epinephrine twice.  He is currently intubated.  PAST MEDICAL HISTORY:   Past Medical History:  Diagnosis Date  . Asthma   . COPD (chronic obstructive pulmonary disease) ([place])   . Diabetes mellitus without complication ([place])   . Hypertension   . Seizures ([place])     3-4 years ago. Single episode.  . Tobacco use     ALLERGIES: Allergies  Allergen Reactions  . Amlodipine Besylate-Valsartan Other (See Comments)    Lowered potassium  . Hydrochlorothiazide Other (See Comments)    Lowered potassium      MEDICATIONS: I have reviewed the patient's current medications.     PAST SURGICAL HISTORY History reviewed. No pertinent surgical history.  FAMILY HISTORY: Family History  Problem Relation Age of Onset  . Lung cancer Maternal Grandfather     SOCIAL HISTORY:  reports that he quit smoking about 4 months ago. His smoking use included cigarettes. He has a 33.00 pack-year smoking history. He has quit using smokeless tobacco. He reports current alcohol use. He reports that he does not use drugs.  PERFORMANCE STATUS: The patient's performance status is 3 - Symptomatic, >50% confined to bed  PHYSICAL EXAM: Most Recent Vital Signs: Blood pressure (!) 80/48, pulse 82, temperature 98.3 F (36.8 C), resp. rate 18, height 5' 10" (1.778 m), weight 223 lb 5.2 oz (101.3 kg), SpO2 100 %. BP (!) 80/48   Pulse 82   Temp 98.3 F (36.8 C)   Resp 18   Ht 5' 10" (1.778 m)   Wt 223 lb 5.2 oz (101.3 kg)   SpO2 100%   BMI 32.04 kg/m  General appearance: appears older than stated age Neck: Positive for cervical adenopathy. Lungs: Decreased breath sounds bilaterally at  bases. Heart: regular rate and rhythm Abdomen: No palpable hepatomegaly.  Spleen is enlarged. Extremities: Trace edema bilaterally. Lymph nodes: Palpable cervical, axillary and inguinal adenopathy. Neurologic: Grossly normal patient is sedated and intubated.  LABORATORY DATA:  Results for orders placed or performed during the hospital encounter of 08/05/19 (from the past 48 hour(s))  CBG monitoring, ED     Status: Abnormal   Collection Time: 08/05/19  5:49 PM  Result Value Ref Range   Glucose-Capillary 192 (H) 70 - 99 mg/dL  Blood gas, arterial     Status: Abnormal   Collection Time:  08/05/19  5:55 PM  Result Value Ref Range   FIO2 44.00    pH, Arterial 7.177 (LL) 7.350 - 7.450    Comment: CRITICAL RESULT CALLED TO, READ BACK BY AND VERIFIED WITH: CARDWELL,L@1811 BY MATTHEWS,B 12.10.2020    pCO2 arterial 103 (HH) 32.0 - 48.0 mmHg    Comment: CRITICAL RESULT CALLED TO, READ BACK BY AND VERIFIED WITH: CARDWELL,L@1811 BY MATTHEWS,B 12.10.2020    pO2, Arterial 107 83.0 - 108.0 mmHg   Bicarbonate 30.6 (H) 20.0 - 28.0 mmol/L   Acid-Base Excess 8.3 (H) 0.0 - 2.0 mmol/L   O2 Saturation 96.6 %   Patient temperature 36.5    Allens test (pass/fail) PASS PASS    Comment: Performed at [facility], [address] [place], [place] [address]  CBG monitoring, ED     Status: Abnormal   Collection Time: 08/05/19  9:29 PM  Result Value Ref Range   Glucose-Capillary 162 (H) 70 - 99 mg/dL  Blood gas, arterial     Status: Abnormal   Collection Time: 08/05/19 10:09 PM  Result Value Ref Range   FIO2 50.00    pH, Arterial 7.252 (L) 7.350 - 7.450   pCO2 arterial 86.6 (HH) 32.0 - 48.0 mmHg    Comment: CRITICAL RESULT CALLED TO, READ BACK BY AND VERIFIED WITH: MYRICK,B AT 2215 ON 12.10.20 BY ISLEY,B    pO2, Arterial 57.5 (L) 83.0 - 108.0 mmHg   Bicarbonate 32.3 (H) 20.0 - 28.0 mmol/L   Acid-Base Excess 9.7 (H) 0.0 - 2.0 mmol/L   O2 Saturation 84.8 %   Patient temperature 37.0    Allens test (pass/fail) PASS PASS    Comment: Performed at [facility], [address] [place], [place] [address]  Glucose, capillary     Status: Abnormal   Collection Time: 08/05/19 11:54 PM  Result Value Ref Range   Glucose-Capillary 188 (H) 70 - 99 mg/dL  Glucose, capillary     Status: Abnormal   Collection Time: 08/06/19  4:04 AM  Result Value Ref Range   Glucose-Capillary 145 (H) 70 - 99 mg/dL  Blood gas, arterial     Status: Abnormal   Collection Time: 08/06/19  5:50 AM  Result Value Ref Range   FIO2 50.00    pH, Arterial 7.257 (L) 7.350 - 7.450   pCO2 arterial 90.5 (HH) 32.0 - 48.0 mmHg     Comment: CRITICAL RESULT CALLED TO, READ BACK BY AND VERIFIED WITH: AMBURN,A AT 6:10AM ON 08/06/19 BY FESTERMAN,C    pO2, Arterial 81.8 (L) 83.0 - 108.0 mmHg   Bicarbonate 34.7 (H) 20.0 - 28.0 mmol/L   Acid-Base Excess 11.7 (H) 0.0 - 2.0 mmol/L   O2 Saturation 95.5 %   Patient temperature 37.0    Allens test (pass/fail) PASS PASS    Comment: Performed at  Hospital, [address] [place], [place] [address]  Glucose, capillary     Status:   Abnormal   Collection Time: 08/06/19  7:40 AM  Result Value Ref Range   Glucose-Capillary 117 (H) 70 - 99 mg/dL  Basic metabolic panel     Status: Abnormal   Collection Time: 08/06/19  7:53 AM  Result Value Ref Range   Sodium 141 135 - 145 mmol/L   Potassium 5.2 (H) 3.5 - 5.1 mmol/L   Chloride 98 98 - 111 mmol/L   CO2 35 (H) 22 - 32 mmol/L   Glucose, Bld 106 (H) 70 - 99 mg/dL   BUN 26 (H) 6 - 20 mg/dL   Creatinine, Ser 0.73 0.61 - 1.24 mg/dL   Calcium 8.4 (L) 8.9 - 10.3 mg/dL   GFR calc non Af Amer >60 >60 mL/min   GFR calc Af Amer >60 >60 mL/min   Anion gap 8 5 - 15    Comment: Performed at [facility], [address] [place], [place] [address]  CBC     Status: Abnormal   Collection Time: 08/06/19  7:53 AM  Result Value Ref Range   WBC 28.3 (H) 4.0 - 10.5 K/uL   RBC 2.24 (L) 4.22 - 5.81 MIL/uL   Hemoglobin 6.5 (LL) 13.0 - 17.0 g/dL    Comment: REPEATED TO VERIFY THIS CRITICAL RESULT HAS VERIFIED AND BEEN CALLED TO A WILSON BY HILLARY FLYNT ON 12 11 2020 AT 0818, AND HAS BEEN READ BACK.     HCT 22.6 (L) 39.0 - 52.0 %   MCV 100.9 (H) 80.0 - 100.0 fL   MCH 29.0 26.0 - 34.0 pg   MCHC 28.8 (L) 30.0 - 36.0 g/dL   RDW 17.8 (H) 11.5 - 15.5 %   Platelets 27 (LL) 150 - 400 K/uL    Comment: PLATELET COUNT CONFIRMED BY SMEAR Immature Platelet Fraction may be clinically indicated, consider ordering this additional test [id number] THIS CRITICAL RESULT HAS VERIFIED AND BEEN CALLED TO A WILSON BY HILLARY FLYNT ON 12 11 2020 AT 0818, AND HAS BEEN  READ BACK.     nRBC 0.3 (H) 0.0 - 0.2 %    Comment: Performed at [facility], [address], [place], [place] [address]  Prepare RBC     Status: None   Collection Time: 08/06/19  8:56 AM  Result Value Ref Range   Order Confirmation      ORDER PROCESSED BY BLOOD BANK Performed at [facility], [address], [place], [place] [address]   Glucose, capillary     Status: Abnormal   Collection Time: 08/06/19 11:45 AM  Result Value Ref Range   Glucose-Capillary 115 (H) 70 - 99 mg/dL  Lactate dehydrogenase     Status: None   Collection Time: 08/06/19  1:48 PM  Result Value Ref Range   LDH 125 98 - 192 U/L    Comment: Performed at [facility], [address], [place], [place] [address]  Haptoglobin     Status: Abnormal   Collection Time: 08/06/19  1:48 PM  Result Value Ref Range   Haptoglobin 432 (H) 23 - 355 mg/dL    Comment: (NOTE) Performed At: [facility] [place], [place] [id number] Rush Farmer MD QQ:[id number]   Reticulocytes     Status: Abnormal   Collection Time: 08/06/19  1:48 PM  Result Value Ref Range   Retic Ct Pct 1.1 0.4 - 3.1 %   RBC. 2.17 (L) 4.22 - 5.81 MIL/uL   Retic Count, Absolute 24.7 19.0 - 186.0 K/uL   Immature Retic Fract 21.4 (H) 2.3 -  15.9 %    Comment: Performed at [facility], [address] [place], [place] [address]  Vitamin B12     Status: Abnormal   Collection Time: 08/06/19  1:48 PM  Result Value Ref Range   Vitamin B-12 168 (L) 180 - 914 pg/mL    Comment: (NOTE) This assay is not validated for testing neonatal or myeloproliferative syndrome specimens for Vitamin B12 levels. Performed at [facility], [address] [place], [place] [address]   Direct antiglobulin test     Status: None   Collection Time: 08/06/19  1:48 PM  Result Value Ref Range   DAT, complement NEG    DAT, IgG      NEG Performed at [facility], [address] [place], [place] [address]   Glucose, capillary     Status: Abnormal    Collection Time: 08/06/19  4:46 PM  Result Value Ref Range   Glucose-Capillary 110 (H) 70 - 99 mg/dL  Glucose, capillary     Status: Abnormal   Collection Time: 08/06/19  8:28 PM  Result Value Ref Range   Glucose-Capillary 104 (H) 70 - 99 mg/dL   Comment 1 Notify RN    Comment 2 Document in Chart   Blood gas, arterial     Status: Abnormal   Collection Time: 08/06/19  9:38 PM  Result Value Ref Range   FIO2 44.00    Delivery systems HI FLOW NASAL CANNULA    pH, Arterial 7.262 (L) 7.350 - 7.450   pCO2 arterial 90.2 (HH) 32.0 - 48.0 mmHg    Comment: CRITICAL RESULT CALLED TO, READ BACK BY AND VERIFIED WITH: TETRAULT,H. AT 2152 ON 08/06/2019 BY EVA    pO2, Arterial 78.4 (L) 83.0 - 108.0 mmHg   Bicarbonate 34.6 (H) 20.0 - 28.0 mmol/L   Acid-Base Excess 12.0 (H) 0.0 - 2.0 mmol/L   O2 Saturation 93.9 %   Patient temperature 37.0    Collection site LEFT RADIAL    Sample type ARTERIAL DRAW    Allens test (pass/fail) PASS PASS    Comment: Performed at [facility], [address] [place], [place] [address]  Glucose, capillary     Status: Abnormal   Collection Time: 08/06/19 11:45 PM  Result Value Ref Range   Glucose-Capillary 111 (H) 70 - 99 mg/dL  Glucose, capillary     Status: Abnormal   Collection Time: 08/07/19  4:48 AM  Result Value Ref Range   Glucose-Capillary 100 (H) 70 - 99 mg/dL  Glucose, capillary     Status: None   Collection Time: 08/07/19  7:46 AM  Result Value Ref Range   Glucose-Capillary 92 70 - 99 mg/dL  Blood gas, arterial     Status: Abnormal   Collection Time: 08/07/19 11:31 AM  Result Value Ref Range   FIO2 45.00    pH, Arterial 7.295 (L) 7.350 - 7.450   pCO2 arterial 87.3 (HH) 32.0 - 48.0 mmHg    Comment: CRITICAL RESULT CALLED TO, READ BACK BY AND VERIFIED WITH: ELIZABETH @ 1151 ON [id number] BY HENDERSON L.    pO2, Arterial 115 (H) 83.0 - 108.0 mmHg   Bicarbonate 36.8 (H) 20.0 - 28.0 mmol/L   Acid-Base Excess 14.2 (H) 0.0 - 2.0 mmol/L   O2 Saturation  98.2 %   Patient temperature 36.2    Allens test (pass/fail) PASS PASS    Comment: Performed at [facility], [address] [place], [place] [address]  Basic metabolic panel       Status: Abnormal   Collection Time: 08/07/19 11:41 AM  Result Value Ref Range   Sodium 140 135 - 145 mmol/L   Potassium 4.7 3.5 - 5.1 mmol/L   Chloride 93 (L) 98 - 111 mmol/L   CO2 36 (H) 22 - 32 mmol/L   Glucose, Bld 90 70 - 99 mg/dL   BUN 27 (H) 6 - 20 mg/dL   Creatinine, Ser 0.71 0.61 - 1.24 mg/dL   Calcium 8.7 (L) 8.9 - 10.3 mg/dL   GFR calc non Af Amer >60 >60 mL/min   GFR calc Af Amer >60 >60 mL/min   Anion gap 11 5 - 15    Comment: Performed at [facility], [address] [place], [place] [address]  CBC     Status: Abnormal   Collection Time: 08/07/19 11:41 AM  Result Value Ref Range   WBC 26.1 (H) 4.0 - 10.5 K/uL   RBC 2.39 (L) 4.22 - 5.81 MIL/uL   Hemoglobin 7.2 (L) 13.0 - 17.0 g/dL   HCT 23.4 (L) 39.0 - 52.0 %   MCV 97.9 80.0 - 100.0 fL   MCH 30.1 26.0 - 34.0 pg   MCHC 30.8 30.0 - 36.0 g/dL   RDW 17.3 (H) 11.5 - 15.5 %   Platelets 22 (LL) 150 - 400 K/uL    Comment: SPECIMEN CHECKED FOR CLOTS CONSISTENT WITH PREVIOUS RESULT THIS CRITICAL RESULT HAS VERIFIED AND BEEN CALLED TO AMANDA WILSON,RN BY KATTIE YANCEY ON 12 12 2020 AT 1219, AND HAS BEEN READ BACK.     nRBC 0.4 (H) 0.0 - 0.2 %    Comment: Performed at [facility], [address] [place], [place] [address]  Magnesium     Status: None   Collection Time: 08/07/19 11:41 AM  Result Value Ref Range   Magnesium 2.2 1.7 - 2.4 mg/dL    Comment: Performed at [facility], [address], [place], [place] [address]  Glucose, capillary     Status: None   Collection Time: 08/07/19 11:58 AM  Result Value Ref Range   Glucose-Capillary 88 70 - 99 mg/dL  Glucose, capillary     Status: None   Collection Time: 08/07/19  1:40 PM  Result Value Ref Range   Glucose-Capillary 97 70 - 99 mg/dL  Basic metabolic panel     Status: Abnormal    Collection Time: 08/07/19  2:01 PM  Result Value Ref Range   Sodium 141 135 - 145 mmol/L   Potassium 4.0 3.5 - 5.1 mmol/L   Chloride 94 (L) 98 - 111 mmol/L   CO2 34 (H) 22 - 32 mmol/L   Glucose, Bld 122 (H) 70 - 99 mg/dL   BUN 29 (H) 6 - 20 mg/dL   Creatinine, Ser 1.00 0.61 - 1.24 mg/dL   Calcium 8.9 8.9 - 10.3 mg/dL   GFR calc non Af Amer >60 >60 mL/min   GFR calc Af Amer >60 >60 mL/min   Anion gap 13 5 - 15    Comment: Performed at [facility], [address] [place], [place] [address]  CBC     Status: Abnormal   Collection Time: 08/07/19  2:01 PM  Result Value Ref Range   WBC 97.2 (HH) 4.0 - 10.5 K/uL    Comment: This critical result has verified and been called to [facility] by KATTIE YANCEY on 12 12 2020 at 1448, and has been read back.    RBC 2.99 (L) 4.22 - 5.81 MIL/uL   Hemoglobin 8.5 (L) 13.0 -  17.0 g/dL   HCT 30.3 (L) 39.0 - 52.0 %   MCV 101.3 (H) 80.0 - 100.0 fL   MCH 28.4 26.0 - 34.0 pg   MCHC 28.1 (L) 30.0 - 36.0 g/dL   RDW 17.6 (H) 11.5 - 15.5 %   Platelets 40 (L) 150 - 400 K/uL    Comment: PLATELET COUNT CONFIRMED BY SMEAR SPECIMEN CHECKED FOR CLOTS    nRBC 0.4 (H) 0.0 - 0.2 %    Comment: Performed at [facility], [address], [place], [place] [address]  Lactic acid, plasma     Status: Abnormal   Collection Time: 08/07/19  2:01 PM  Result Value Ref Range   Lactic Acid, Venous 3.3 (HH) 0.5 - 1.9 mmol/L    Comment: CRITICAL RESULT CALLED TO, READ BACK BY AND VERIFIED WITH: ELIZABETH @ 1433 ON [id number] BY HENDERSON L. Performed at [facility], [address] [place], [place] [address]   Magnesium     Status: Abnormal   Collection Time: 08/07/19  2:01 PM  Result Value Ref Range   Magnesium 2.6 (H) 1.7 - 2.4 mg/dL    Comment: Performed at [facility], [address], [place], [place] [address]  Troponin I (High Sensitivity)     Status: None   Collection Time: 08/07/19  2:01 PM  Result Value Ref Range   Troponin I (High Sensitivity) 4 <18 ng/L    Comment:  (NOTE) Elevated high sensitivity troponin I (hsTnI) values and significant  changes across serial measurements may suggest ACS but many other  chronic and acute conditions are known to elevate hsTnI results.  Refer to the "Links" section for chest pain algorithms and additional  guidance. Performed at [facility], [address], [place], [place] [address]   Urinalysis, Routine w reflex microscopic     Status: Abnormal   Collection Time: 08/07/19  2:29 PM  Result Value Ref Range   Color, Urine YELLOW YELLOW   APPearance CLEAR CLEAR   Specific Gravity, Urine 1.023 1.005 - 1.030   pH 5.0 5.0 - 8.0   Glucose, UA NEGATIVE NEGATIVE mg/dL   Hgb urine dipstick SMALL (A) NEGATIVE   Bilirubin Urine NEGATIVE NEGATIVE   Ketones, ur 5 (A) NEGATIVE mg/dL   Protein, ur 30 (A) NEGATIVE mg/dL   Nitrite NEGATIVE NEGATIVE   Leukocytes,Ua NEGATIVE NEGATIVE   RBC / HPF 0-5 0 - 5 RBC/hpf   WBC, UA 0-5 0 - 5 WBC/hpf   Bacteria, UA NONE SEEN NONE SEEN   Mucus PRESENT     Comment: Performed at [facility], [address], [place], [place] [address]  Glucose, capillary     Status: Abnormal   Collection Time: 08/07/19  4:01 PM  Result Value Ref Range   Glucose-Capillary 177 (H) 70 - 99 mg/dL  Blood gas, arterial (at [facility])     Status: Abnormal   Collection Time: 08/07/19  4:27 PM  Result Value Ref Range   FIO2 70.00    pH, Arterial 7.344 (L) 7.350 - 7.450   pCO2 arterial 69.6 (HH) 32.0 - 48.0 mmHg    Comment: CRITICAL RESULT CALLED TO, READ BACK BY AND VERIFIED WITH: ELIZABETH RN @ 1641 ON [id number] BY HENDERSON L    pO2, Arterial 190 (H) 83.0 - 108.0 mmHg   Bicarbonate 34.2 (H) 20.0 - 28.0 mmol/L   Acid-Base Excess 10.9 (H) 0.0 - 2.0 mmol/L   O2 Saturation 99.9 %   Patient temperature 36.8    Allens test (pass/fail)  PASS PASS    Comment: Performed at [facility], [address] [place], [place] [address]      RADIOGRAPHY: CT ABDOMEN PELVIS W CONTRAST  Result Date: 08/06/2019 CLINICAL  DATA:  CLL/SLL. EXAM: CT ABDOMEN AND PELVIS WITH CONTRAST TECHNIQUE: Multidetector CT imaging of the abdomen and pelvis was performed using the standard protocol following bolus administration of intravenous contrast. CONTRAST:  100mL OMNIPAQUE IOHEXOL 300 MG/ML  SOLN COMPARISON:  04/30/2019 FINDINGS: Lower chest: There are moderate to large bilateral pleural effusions that are only partially visualized on this exam.The heart size is borderline enlarged. There are few ground-glass airspace opacities at the lung bases with interlobular septal thickening suggestive of volume overload/developing pulmonary edema. Hepatobiliary: The liver is normal. There is layering hyperdense material within the gallbladder lumen. This may represent gallbladder sludge or vicarious excretion of contrast from the patient's prior contrast enhanced CT.There is no biliary ductal dilation. Pancreas: Normal contours without ductal dilatation. No peripancreatic fluid collection. Spleen: The spleen is significantly enlarged Adrenals/Urinary Tract: --Adrenal glands: No adrenal hemorrhage. --Right kidney/ureter: No hydronephrosis or perinephric hematoma. --Left kidney/ureter: No hydronephrosis or perinephric hematoma. --Urinary bladder: Unremarkable. Stomach/Bowel: --Stomach/Duodenum: No hiatal hernia or other gastric abnormality. Normal duodenal course and caliber. --Small bowel: No dilatation or inflammation. --Colon: No focal abnormality. --Appendix: Normal. Vascular/Lymphatic: Atherosclerotic calcification is present within the non-aneurysmal abdominal aorta, without hemodynamically significant stenosis. --there is extensive retroperitoneal adenopathy --there is extensive mesenteric adenopathy --there is extensive pelvic and inguinal adenopathy. Overall, the adenopathy appears to be relatively stable to slightly improved when compared to prior study. Reproductive: Unremarkable Other: No ascites or free air. The abdominal wall is normal.  Musculoskeletal. 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 There is no acute displaced fracture. IMPRESSION: 1. Moderate to large bilateral pleural effusions with adjacent atelectasis. 2. Extensive adenopathy throughout the abdomen and pelvis, similar to slightly improved when compared to September 2020 CT. 3. Splenomegaly. 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 Aortic Atherosclerosis (ICD10-I70.0). Electronically Signed   By: Christopher  Green M.D.   On: 08/06/2019 22:34   DG CHEST PORT 1 VIEW  Result Date: 08/07/2019 CLINICAL DATA:  Tube placement, code EXAM: PORTABLE CHEST 1 VIEW COMPARISON:  06/05/2019 FINDINGS: Interval placement of endotracheal tube, tip projecting over the mid trachea. Esophagogastric tube, poorly visualized due to underpenetration although appears to be with tip and side port below the diaphragm. There is mild, diffuse interstitial pulmonary opacity. Layering pleural effusions seen on prior CT are not well appreciated. No new airspace opacity. IMPRESSION: 1. Interval placement of endotracheal tube, tip projecting over the mid trachea. 2. Esophagogastric tube, poorly visualized due to underpenetration although appears to be with tip and side port below the diaphragm. 3. There is mild, diffuse interstitial pulmonary opacity. Layering pleural effusions seen on prior CT are not well appreciated. No new airspace opacity. Electronically Signed   By: Alex  Bibbey M.D.   On: 08/07/2019 14:10       ASSESSMENT and PLAN:  1.  Macrocytic anemia: -Differential diagnosis includes bone marrow infiltration with CLL versus severe B12 deficiency. -Admitted with hemoglobin of 6.5 with MCV of 100.9.  LDH and reticulocyte count was normal.  Total bilirubin was normal.  Direct Coombs test was normal. -Had a prior admission with hemoglobin of 4.2, status post transfusion on discharge. -Would recommend transfusion for symptomatic anemia and  respiratory insufficiency. -Would recommend vitamin B12 injections daily for 7 days.  May be given deep subcu if   there is concern for hematoma. -Bone marrow biopsy on Monday by IR. -EBV, parvo B19 panel were sent.  Would also consider [facility] panel on Monday.  2.  Thrombocytopenia: -Platelet count ranging between 20 and 40.  No active bleeding. -If there is active bleeding, would recommend transfusion to keep counts above 50. -Likely bone marrow infiltration from CLL versus severe B12 deficiency.  3.  CLL: -Patient with history of lymphadenopathy since 2010. -Diagnosed in August 2020 by flow cytometry, CD5 positive, CD 23+ lymphocytes consistent with CLL. -CT of the abdomen and pelvis on 10/06/2018 showed extensive adenopathy in the retroperitoneal region, mesenteric region, pelvic and inguinal lymph nodes.  Spleen is enlarged. -CT of the chest on 08/05/2019 showed bulky bilateral axillary, lower cervical, mediastinal and hilar lymph nodes.  There are occasional small pulmonary nodules which are new or enlarged when compared to prior CT.  These are all subcentimeter. -CLL FISH panel showed ATM mutation.  T p53 by FISH was negative.  I GVH mutation positive. -She follows up with Dr. Tasia Catchings at [facility] and being planned for venetoclax and obinutuzumab-based regimen.  All questions were answered. The patient knows to call the clinic with any problems, questions or concerns. We can certainly see the patient much sooner if necessary.    Derek Jack

## 2019-08-07 NOTE — Significant Event (Signed)
Code Blue was called on this patient.    Pt had respiratory arrest, thready pulse when nurse found the pt, who had apparently taken off his nasal cannula.  While nurse was trying to put pt on BiPAP, pt lost his pulse was in PEA, and CPR was started on 13:36.    First Epi given at 13:37, Bicarb given at 13:40, 2nd epi given at 13:41, intubated at 13:42, regained pulse at 13:43.  Blood pressure initially improved to 130's without any IVF, but started to trend down to 70's again, so NS 1L bolus given, with quick improvement in blood pressure back to 130's.  Pt placed on sedation protocol with Fentanyl and propofol due to agitation.  Central line placed by GenSurg.  A-line attempted.    Labs drawn after the code were notable for WBC increased to 97 from 26 in 3 hours, trop 4, lactic acid 3.3.  CXR showed "There is mild, diffuse interstitial pulmonary opacity."  On review of tele records, pt had a gradual decline in O2 sat from ~13:00 and reached critical low sat of 50% on 13:26.  Pt coded from worsening hypoxia.  Wife was called during the code who came back to the hospital right away.  Wife was updated on pt's condition.  I have called the [facility] and arranged transfer to ICU in [facility].  Total critical care time was at least 60 minutes.

## 2019-08-07 NOTE — ED Provider Notes (Signed)
[place]  Department of Emergency Medicine   Code Blue CONSULT NOTE  Chief Complaint: Cardiac arrest/unresponsive   Level V Caveat: Unresponsive  History of present illness: I was contacted by the hospital for a CODE BLUE cardiac arrest upstairs and presented to the patient's bedside.  Patient found with CPR in progress.  Nursing reports that he had been on BiPAP throughout the night which was transitioned to nasal cannula and may have removed it inadvertently.  He was found to be bradycardic and minimally responsive and went into PEA arrest.  History of CLL and thrombocytopenia around 20.  48 y.o.malewith medical history significant forrecent diagnosis of CLL/CML, acute on chronic respiratory failure on 2 L O2, COPD, seizures, diabetes mellitus, hypertension.  ROS: Unable to obtain, Level V caveat  Scheduled Meds: . atorvastatin  40 mg Oral q1800  . budesonide  0.5 mg Inhalation BID  . Chlorhexidine Gluconate Cloth  6 each Topical Daily  . escitalopram  20 mg Oral Daily   Continuous Infusions: . azithromycin Stopped (08/06/19 1907)  . ceFEPime (MAXIPIME) IV 2 g (08/07/19 0640)   PRN Meds:.acetaminophen **OR** acetaminophen, ipratropium-albuterol, labetalol, LORazepam, ondansetron **OR** ondansetron (ZOFRAN) IV, polyethylene glycol Past Medical History:  Diagnosis Date  . Asthma   . COPD (chronic obstructive pulmonary disease) ([place])   . Diabetes mellitus without complication ([place])   . Hypertension   . Seizures ([place])    3-4 years ago. Single episode.  . Tobacco use    History reviewed. No pertinent surgical history. Social History   Socioeconomic History  . Marital status: Married    Spouse name: Not on file  . Number of children: Not on file  . Years of education: Not on file  . Highest education level: Not on file  Occupational History  . Not on file  Tobacco Use  . Smoking status: Former Smoker    Packs/day: 1.00    Years: 33.00    Pack  years: 33.00    Types: Cigarettes    Quit date: 03/23/2019    Years since quitting: 0.3  . Smokeless tobacco: Former Network engineer and Sexual Activity  . Alcohol use: Yes    Alcohol/week: 0.0 standard drinks    Comment: rare  . Drug use: No    Comment: smokes pot  . Sexual activity: Not on file  Other Topics Concern  . Not on file  Social History Narrative  . Not on file   Social Determinants of Health   Financial Resource Strain:   . Difficulty of Paying Living Expenses: Not on file  Food Insecurity:   . Worried About Charity fundraiser in the Last Year: Not on file  . Ran Out of Food in the Last Year: Not on file  Transportation Needs:   . Lack of Transportation (Medical): Not on file  . Lack of Transportation (Non-Medical): Not on file  Physical Activity:   . Days of Exercise per Week: Not on file  . Minutes of Exercise per Session: Not on file  Stress:   . Feeling of Stress : Not on file  Social Connections:   . Frequency of Communication with Friends and Family: Not on file  . Frequency of Social Gatherings with Friends and Family: Not on file  . Attends Religious Services: Not on file  . Active Member of Clubs or Organizations: Not on file  . Attends Archivist Meetings: Not on file  . Marital Status: Not on file  Intimate  Partner Violence:   . Fear of Current or Ex-Partner: Not on file  . Emotionally Abused: Not on file  . Physically Abused: Not on file  . Sexually Abused: Not on file   Allergies  Allergen Reactions  . Amlodipine Besylate-Valsartan Other (See Comments)    Lowered potassium  . Hydrochlorothiazide Other (See Comments)    Lowered potassium    Last set of Vital Signs (not current) Vitals:   08/07/19 1100 08/07/19 1156  BP: 127/70   Pulse: 97 97  Resp: 17 (!) 23  Temp:    SpO2: 99% 100%      Physical Exam  Gen: unresponsive Cardiovascular: pulseless  Resp: apneic. Breath sounds equal bilaterally with bagging  Abd:  nondistended  Neuro: GCS 3, unresponsive to pain  HEENT: No blood in posterior pharynx, gag reflex absent  Neck: No crepitus  Musculoskeletal: No deformity  Skin: warm  Procedures  INTUBATION Performed by: Ezequiel Essex Required items: required blood products, implants, devices, and special equipment available Patient identity confirmed: provided demographic data and hospital-assigned identification number Time out: Immediately prior to procedure a "time out" was called to verify the correct patient, procedure, equipment, support staff and site/side marked as required. Indications: Respiratory failure Intubation method: Video Preoxygenation: BVM Sedatives: None Paralytic: None Tube Size: 7.5 cuffed Post-procedure assessment: chest rise and ETCO2 monitor Breath sounds: equal and absent over the epigastrium Tube secured by Respiratory Therapy Patient tolerated the procedure well with no immediate complications.  CRITICAL CARE Performed by: Ezequiel Essex Total critical care time: 35 Critical care time was exclusive of separately billable procedures and treating other patients. Critical care was necessary to treat or prevent imminent or life-threatening deterioration. Critical care was time spent personally by me on the following activities: development of treatment plan with patient and/or surrogate as well as nursing, discussions with consultants, evaluation of patient's response to treatment, examination of patient, obtaining history from patient or surrogate, ordering and performing treatments and interventions, ordering and review of laboratory studies, ordering and review of radiographic studies, pulse oximetry and re-evaluation of patient's condition.  Cardiopulmonary Resuscitation (CPR) Procedure Note Directed/Performed by: Ezequiel Essex I personally directed ancillary staff and/or performed CPR in an effort to regain return of spontaneous circulation and to maintain  cardiac, neuro and systemic perfusion.   Patient in PEA arrest.  ACLS protocol initiated on arrival.  Continued chest compressions and given epinephrine, bicarb.  CBG is 97.  CPR continued for approximately 10 min with return of spontaneous circulation. Patient intubated during code as above. EKG being performed shows no acute STEMI or interval change. Chest x-ray shows no pneumothorax but does show diffuse airspace opacities bilaterally.  Patient did have return of spontaneous circulation.  Blood pressure was [id number] systolic.  He was given IV fluids.  Care transferred back to hospitalist team at bedside, Dr. Billie Ruddy.   EKG Interpretation  Date/Time:  Thursday August 05 2019 13:45:27 EST Ventricular Rate:  122 PR Interval:    QRS Duration: 94 QT Interval:  309 QTC Calculation: 441 R Axis:   114 Text Interpretation: Incomplete analysis due to missing data in precordial lead(s) Sinus tachycardia Right axis deviation Missing lead(s): V2 No significant change since prior today Confirmed by Aletta Edouard [phone]) on 08/06/2019 12:38:12 PM          Ezequiel Essex, MD 08/07/19 1624

## 2019-08-07 NOTE — Progress Notes (Signed)
PROGRESS NOTE    Larry Lam  SKA:[id number] DOB: 21-Dec-1970 DOA: 08/05/2019 PCP: [name], MD    Assessment & Plan:   Active Problems:   Acute respiratory failure (HCC)   Multifocal pneumonia   Acute on chronic respiratory failure (HCC)   Idiopathic hypotension   Larry Lam is a 48 y.o. male with medical history significant for recent diagnosis of CLL/CML, acute on chronic respiratory failure on 2 L O2, COPD, seizures, diabetes mellitus, hypertension. Patient presented to the ED with complaints of increasing difficulty breathing going on over the past 1 - 2 weeks.  # Acute encephalopathy due to hypoxia and hypercapnia --tx underlying cause as below  # Acute on chronic hypoxic and hypercapnic respiratory failure # Chronic hypoxic respiratory failure on 2L at [place] -O2 sats initially 40% on 3 L O2 on presentation.  Likely secondary to multifocal pneumonia, possible component of COPD exacerbation.  COVID neg.  pH of 7.17, PCO2 of 103, so started on BiPAP.  Cefe and azithromycin ordered on presentation. PLAN: --continue BiPAP and monitor response with ABG -Goal O2 sats greater than 88%. -continue cefe and azithromycin  Multifocal pneumonia - patient rules in for sepsis with tachycardia, tachypnea, leukocytosis 39.8 (some of this likely from leukemia).  With increased O2 requirement.  Covid PCR negative, also negative influenza.   CTA chest negative for PE, shows diffuse bilateral groundglass opacity-infection versus edema. BNP mildly elevated at 265, compared to prior 172 four days prior.  No history of heart failure, no peripheral sign of volume overload. --Procal neg PLAN: - d/c IV vancomycin  --continue cefe and azithromycin  COPD - increasing difficulty breathing, no wheezing or rhonchi, no cough.  Received solu-medrol 125 mg x1 on presentation.  Steroid not continued. -Supplemental O2. -Resume home bronchodilators -DuoNeb scheduled and as  needed   CML/CLL-diagnosed 04/2019.   Follows with Dr. Tasia Catchings, [facility].  Reports he has not started chemotherapy, plans to start in about a week.  Chest CTA on presentation showed numerous bulky bilateral axillary, lower cervical, mediastinal, and hilar lymph nodes, as well as bulky lymph nodes in the partially imaged upper abdomen, similar in size compared to CT dated 04/30/2019 and in keeping with diagnosis of CLL. --Oncology consult, who requested order for IR bone marrow biopsy to be done on Monday 08/09/19, already ordered.  Acute on chronic anemia, thrombocytopenia -  Hemoglobin on presentation 8.2, decreased to 6.5 next day, received 1u pRBC.  Plt in 20's.  No signs of bleeding. PLAN: --Transfuse to keep Hgb >=7 --No need for plt transfusion currently given no signs of bleeding -Hold home aspirin, therapeutic DVT prophylaxis -Oncology consult  Hypertension-stable. -Home medications lisinopril held for now for contrast exposure -As needed labetalol  Diabetes mellitus -random glucose 216.  Recent Hemoglobin A1c 7.7 --Hold home oral agents --No need for fingersticks  Incidental findings pulmonary nodules- There are occasional small pulmonary nodules, which are new or enlarged when compared to prior CT. A nodule of the right pulmonary apex measures 7 mm, previously 3 mm when measured similarly (series 7, image 24). A new nodule of the right lower lobe measures 6 mm (series 7, image 84). These are most likely infectious or inflammatory; pulmonary nodules are an unlikely manifestation of pulmonary involvement of lymphoma.  -Follow-up as outpatient  DVT prophylaxis: SCDs Code Status: Full code Family Communication: Updated wife at bedside today Disposition Plan: > 2 days Consults called: Hematology/Oncology Admission status: Inpatient, stepdown.   Subjective: Pt had been agitated  again overnight fighting BiPAP, ativan given.   This morning, pt was more alert, though ABG was only  slightly better.  Pt asked to eat, so BiPAP was removed with instruction to put it back when pt falls asleep for any length of time.  Pt's wife fed pt some ice cream and fluids.  Around 1:30 pm, pt was found to be hypoxic and hypotensive and lost his pulse.  Code blue called.  Please see significant event note of the same day 08/07/19.   Objective: Vitals:   08/07/19 1433 08/07/19 1500 08/07/19 1600 08/07/19 1623  BP:  (!) 152/73 (!) 69/59   Pulse:  (!) 132 90   Resp:  (!) 28 20   Temp:    98.3 F (36.8 C)  TempSrc:      SpO2: 91% 99% 99%   Weight:      Height:        Intake/Output Summary (Last 24 hours) at 08/07/2019 1625 Last data filed at 08/07/2019 1601 Gross per 24 hour  Intake 1800.31 ml  Output 1000 ml  Net 800.31 ml   Filed Weights   08/05/19 0837 08/07/19 0400  Weight: 103 kg 101.3 kg    Examination:  Before the code blue  Constitutional: NAD, oriented though somnolent  HEENT: conjunctivae and lids normal, EOMI CV: RRR no M,R,G. Distal pulses +2.  No cyanosis.   RESP: mild crackles, normal respiratory effort, on 5L [place] sating 99% GI: +BS, NTND Extremities: No effusions, edema, or tenderness in BLE SKIN: warm, dry and intact Neuro: II - XII grossly intact.  Sensation intact   Data Reviewed: I have personally reviewed following labs and imaging studies  CBC: Recent Labs  Lab 08/03/19 1300 08/05/19 0839 08/06/19 0753 08/07/19 1141 08/07/19 1401  WBC 17.0* 39.8* 28.3* 26.1* 97.2*  NEUTROABS 0.6* 0.9*  --   --   --   HGB 7.4* 8.2* 6.5* 7.2* 8.5*  HCT 24.2* 28.7* 22.6* 23.4* 30.3*  MCV 94.2 100.0 100.9* 97.9 101.3*  PLT 32* 40* 27* 22* 40*   Basic Metabolic Panel: Recent Labs  Lab 08/02/19 0432 08/05/19 0839 08/06/19 0753 08/07/19 1141 08/07/19 1401  NA 137 140 141 140 141  K 4.5 4.8 5.2* 4.7 4.0  CL 94* 93* 98 93* 94*  CO2 33* 33* 35* 36* 34*  GLUCOSE 156* 216* 106* 90 122*  BUN 26* 26* 26* 27* 29*  CREATININE 0.77 0.69 0.73 0.71 1.00    CALCIUM 9.1 9.3 8.4* 8.7* 8.9  MG  --   --   --  2.2 2.6*   GFR: Estimated Creatinine Clearance: 106.5 mL/min (by C-G formula based on SCr of 1 mg/dL). Liver Function Tests: No results for input(s): AST, ALT, ALKPHOS, BILITOT, PROT, ALBUMIN in the last 168 hours. No results for input(s): LIPASE, AMYLASE in the last 168 hours. No results for input(s): AMMONIA in the last 168 hours. Coagulation Profile: No results for input(s): INR, PROTIME in the last 168 hours. Cardiac Enzymes: No results for input(s): CKTOTAL, CKMB, CKMBINDEX, TROPONINI in the last 168 hours. BNP (last 3 results) No results for input(s): PROBNP in the last 8760 hours. HbA1C: No results for input(s): HGBA1C in the last 72 hours. CBG: Recent Labs  Lab 08/07/19 0448 08/07/19 0746 08/07/19 1158 08/07/19 1340 08/07/19 1601  GLUCAP 100* 92 88 97 177*   Lipid Profile: No results for input(s): CHOL, HDL, LDLCALC, TRIG, CHOLHDL, LDLDIRECT in the last 72 hours. Thyroid Function Tests: No results for input(s): TSH, T4TOTAL,  FREET4, T3FREE, THYROIDAB in the last 72 hours. Anemia Panel: Recent Labs    08/06/19 1348  VITAMINB12 168*  RETICCTPCT 1.1   Sepsis Labs: Recent Labs  Lab 08/02/19 0432 08/05/19 0839 08/05/19 1648 08/07/19 1401  PROCALCITON <0.10 <0.10  --   --   LATICACIDVEN  --   --  0.4* 3.3*    Recent Results (from the past 240 hour(s))  SARS CORONAVIRUS 2 (TAT 6-24 HRS) Nasopharyngeal Per Rectum     Status: None   Collection Time: 07/31/19  3:20 AM   Specimen: Per Rectum; Nasopharyngeal  Result Value Ref Range Status   SARS Coronavirus 2 NEGATIVE NEGATIVE Final    Comment: (NOTE) SARS-CoV-2 target nucleic acids are NOT DETECTED. The SARS-CoV-2 RNA is generally detectable in upper and lower respiratory specimens during the acute phase of infection. Negative results do not preclude SARS-CoV-2 infection, do not rule out co-infections with other pathogens, and should not be used as the sole  basis for treatment or other patient management decisions. Negative results must be combined with clinical observations, patient history, and epidemiological information. The expected result is Negative. Fact Sheet for Patients: SugarRoll.be Fact Sheet for Healthcare Providers: https://www.woods-mathews.com/ This test is not yet approved or cleared by the [place] FDA and  has been authorized for detection and/or diagnosis of SARS-CoV-2 by FDA under an Emergency Use Authorization (EUA). This EUA will remain  in effect (meaning this test can be used) for the duration of the COVID-19 declaration under Section 56 4(b)(1) of the Act, 21 U.S.C. section 360bbb-3(b)(1), unless the authorization is terminated or revoked sooner. Performed at [facility], [place] [address] [place], [place] [address]   Respiratory Panel by RT PCR (Flu A&B, Covid) - Nasopharyngeal Swab     Status: None   Collection Time: 08/05/19 11:50 AM   Specimen: Nasopharyngeal Swab  Result Value Ref Range Status   SARS Coronavirus 2 by RT PCR NEGATIVE NEGATIVE Final    Comment: (NOTE) SARS-CoV-2 target nucleic acids are NOT DETECTED. The SARS-CoV-2 RNA is generally detectable in upper respiratoy specimens during the acute phase of infection. The lowest concentration of SARS-CoV-2 viral copies this assay can detect is 131 copies/mL. A negative result does not preclude SARS-Cov-2 infection and should not be used as the sole basis for treatment or other patient management decisions. A negative result may occur with  improper specimen collection/handling, submission of specimen other than nasopharyngeal swab, presence of viral mutation(s) within the areas targeted by this assay, and inadequate number of viral copies (<131 copies/mL). A negative result must be combined with clinical observations, patient history, and epidemiological information. The expected result is  Negative. Fact Sheet for Patients:  PinkCheek.be Fact Sheet for Healthcare Providers:  GravelBags.it This test is not yet ap proved or cleared by the [place] FDA and  has been authorized for detection and/or diagnosis of SARS-CoV-2 by FDA under an Emergency Use Authorization (EUA). This EUA will remain  in effect (meaning this test can be used) for the duration of the COVID-19 declaration under Section 564(b)(1) of the Act, 21 U.S.C. section 360bbb-3(b)(1), unless the authorization is terminated or revoked sooner.    Influenza A by PCR NEGATIVE NEGATIVE Final   Influenza B by PCR NEGATIVE NEGATIVE Final    Comment: (NOTE) The Xpert Xpress SARS-CoV-2/FLU/RSV assay is intended as an aid in  the diagnosis of influenza from Nasopharyngeal swab specimens and  should not be used as a sole basis for treatment. Nasal washings  and  aspirates are unacceptable for Xpert Xpress SARS-CoV-2/FLU/RSV  testing. Fact Sheet for Patients: PinkCheek.be Fact Sheet for Healthcare Providers: GravelBags.it This test is not yet approved or cleared by the [place] FDA and  has been authorized for detection and/or diagnosis of SARS-CoV-2 by  FDA under an Emergency Use Authorization (EUA). This EUA will remain  in effect (meaning this test can be used) for the duration of the  Covid-19 declaration under Section 564(b)(1) of the Act, 21  U.S.C. section 360bbb-3(b)(1), unless the authorization is  terminated or revoked. Performed at [facility], [place] [address] [place], [place] [address]   MRSA PCR Screening     Status: None   Collection Time: 08/05/19 12:15 PM   Specimen: Nasal Mucosa; Nasopharyngeal  Result Value Ref Range Status   MRSA by PCR NEGATIVE NEGATIVE Final    Comment:        The GeneXpert MRSA Assay (FDA approved for NASAL specimens only), is one component of  a comprehensive MRSA colonization surveillance program. It is not intended to diagnose MRSA infection nor to guide or monitor treatment for MRSA infections. Performed at [facility], [address], [place], [place] [address]          Radiology Studies: CT ABDOMEN PELVIS W CONTRAST  Result Date: 08/06/2019 CLINICAL DATA:  CLL/SLL. EXAM: CT ABDOMEN AND PELVIS WITH CONTRAST TECHNIQUE: Multidetector CT imaging of the abdomen and pelvis was performed using the standard protocol following bolus administration of intravenous contrast. CONTRAST:  117m OMNIPAQUE IOHEXOL 300 MG/ML  SOLN COMPARISON:  04/30/2019 FINDINGS: Lower chest: There are moderate to large bilateral pleural effusions that are only partially visualized on this exam.The heart size is borderline enlarged. There are few ground-glass airspace opacities at the lung bases with interlobular septal thickening suggestive of volume overload/developing pulmonary edema. Hepatobiliary: The liver is normal. There is layering hyperdense material within the gallbladder lumen. This may represent gallbladder sludge or vicarious excretion of contrast from the patient's prior contrast enhanced CT.There is no biliary ductal dilation. Pancreas: Normal contours without ductal dilatation. No peripancreatic fluid collection. Spleen: The spleen is significantly enlarged Adrenals/Urinary Tract: --Adrenal glands: No adrenal hemorrhage. --Right kidney/ureter: No hydronephrosis or perinephric hematoma. --Left kidney/ureter: No hydronephrosis or perinephric hematoma. --Urinary bladder: Unremarkable. Stomach/Bowel: --Stomach/Duodenum: No hiatal hernia or other gastric abnormality. Normal duodenal course and caliber. --Small bowel: No dilatation or inflammation. --Colon: No focal abnormality. --Appendix: Normal. Vascular/Lymphatic: Atherosclerotic calcification is present within the non-aneurysmal abdominal aorta, without hemodynamically significant stenosis. --there  is extensive retroperitoneal adenopathy --there is extensive mesenteric adenopathy --there is extensive pelvic and inguinal adenopathy. Overall, the adenopathy appears to be relatively stable to slightly improved when compared to prior study. Reproductive: Unremarkable Other: No ascites or free air. The abdominal wall is normal. Musculoskeletal. 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 There is no acute displaced fracture. IMPRESSION: 1. Moderate to large bilateral pleural effusions with adjacent atelectasis. 2. Extensive adenopathy throughout the abdomen and pelvis, similar to slightly improved when compared to September 2020 CT. 3. Splenomegaly. 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 Aortic Atherosclerosis (ICD10-I70.0). Electronically Signed   By: CConstance HolsterM.D.   On: 08/06/2019 22:34   DG CHEST PORT 1 VIEW  Result Date: 08/07/2019 CLINICAL DATA:  Tube placement, code EXAM: PORTABLE CHEST 1 VIEW COMPARISON:  06/05/2019 FINDINGS: Interval placement of endotracheal tube, tip projecting over the mid trachea. Esophagogastric tube, poorly visualized due to underpenetration although appears to  be with tip and side port below the diaphragm. There is mild, diffuse interstitial pulmonary opacity. Layering pleural effusions seen on prior CT are not well appreciated. No new airspace opacity. IMPRESSION: 1. Interval placement of endotracheal tube, tip projecting over the mid trachea. 2. Esophagogastric tube, poorly visualized due to underpenetration although appears to be with tip and side port below the diaphragm. 3. There is mild, diffuse interstitial pulmonary opacity. Layering pleural effusions seen on prior CT are not well appreciated. No new airspace opacity. Electronically Signed   By: Eddie Candle M.D.   On: 08/07/2019 14:10        Scheduled Meds: . atorvastatin  40 mg Oral q1800  . budesonide  0.5 mg Inhalation BID  . Chlorhexidine  Gluconate Cloth  6 each Topical Daily  . [START ON 08/08/2019] Chlorhexidine Gluconate Cloth  6 each Topical Q0600  . escitalopram  20 mg Oral Daily  . sodium chloride flush  10-40 mL Intracatheter Q12H  . sodium chloride flush  10-40 mL Intracatheter Q12H   Continuous Infusions: . sodium chloride    . azithromycin Stopped (08/06/19 1907)  . ceFEPime (MAXIPIME) IV 2 g (08/07/19 0640)  . propofol (DIPRIVAN) infusion 20 mcg/kg/min (08/07/19 1601)     LOS: 2 days    Enzo Bi, MD [facility] If 7PM-7AM, please contact night-coverage 08/07/2019, 4:25 PM

## 2019-08-08 ENCOUNTER — Inpatient Hospital Stay (HOSPITAL_COMMUNITY): Payer: Medicare Other

## 2019-08-08 DIAGNOSIS — I469 Cardiac arrest, cause unspecified: Secondary | ICD-10-CM

## 2019-08-08 DIAGNOSIS — J9620 Acute and chronic respiratory failure, unspecified whether with hypoxia or hypercapnia: Secondary | ICD-10-CM

## 2019-08-08 LAB — COMPREHENSIVE METABOLIC PANEL
ALT: 22 U/L (ref 0–44)
AST: 23 U/L (ref 15–41)
Albumin: 2.5 g/dL — ABNORMAL LOW (ref 3.5–5.0)
Alkaline Phosphatase: 63 U/L (ref 38–126)
Anion gap: 10 (ref 5–15)
BUN: 41 mg/dL — ABNORMAL HIGH (ref 6–20)
CO2: 36 mmol/L — ABNORMAL HIGH (ref 22–32)
Calcium: 8.5 mg/dL — ABNORMAL LOW (ref 8.9–10.3)
Chloride: 96 mmol/L — ABNORMAL LOW (ref 98–111)
Creatinine, Ser: 1.57 mg/dL — ABNORMAL HIGH (ref 0.61–1.24)
GFR calc Af Amer: 59 mL/min — ABNORMAL LOW (ref >60)
GFR calc non Af Amer: 51 mL/min — ABNORMAL LOW (ref >60)
Glucose, Bld: 104 mg/dL — ABNORMAL HIGH (ref 70–99)
Potassium: 4.6 mmol/L (ref 3.5–5.1)
Sodium: 142 mmol/L (ref 135–145)
Total Bilirubin: 1 mg/dL (ref 0.3–1.2)
Total Protein: 5.6 g/dL — ABNORMAL LOW (ref 6.5–8.1)

## 2019-08-08 LAB — CBC
HCT: 20.3 % — ABNORMAL LOW (ref 39.0–52.0)
HCT: 23.5 % — ABNORMAL LOW (ref 39.0–52.0)
Hemoglobin: 6.5 g/dL — CL (ref 13.0–17.0)
Hemoglobin: 7.5 g/dL — ABNORMAL LOW (ref 13.0–17.0)
MCH: 30.1 pg (ref 26.0–34.0)
MCH: 29.4 pg (ref 26.0–34.0)
MCHC: 32 g/dL (ref 30.0–36.0)
MCHC: 31.9 g/dL (ref 30.0–36.0)
MCV: 94 fL (ref 80.0–100.0)
MCV: 92.2 fL (ref 80.0–100.0)
Platelets: 24 10*3/uL — CL (ref 150–400)
Platelets: 26 10*3/uL — CL (ref 150–400)
RBC: 2.16 MIL/uL — ABNORMAL LOW (ref 4.22–5.81)
RBC: 2.55 MIL/uL — ABNORMAL LOW (ref 4.22–5.81)
RDW: 17.2 % — ABNORMAL HIGH (ref 11.5–15.5)
RDW: 18.5 % — ABNORMAL HIGH (ref 11.5–15.5)
WBC: 21.1 10*3/uL — ABNORMAL HIGH (ref 4.0–10.5)
WBC: 26 10*3/uL — ABNORMAL HIGH (ref 4.0–10.5)
nRBC: 0.4 % — ABNORMAL HIGH (ref 0.0–0.2)
nRBC: 0.3 % — ABNORMAL HIGH (ref 0.0–0.2)

## 2019-08-08 LAB — ECHOCARDIOGRAM COMPLETE
Height: 70 in
Weight: 3573.22 oz

## 2019-08-08 LAB — EPSTEIN-BARR VIRUS (EBV) ANTIBODY PROFILE
EBV NA IgG: 516 U/mL — ABNORMAL HIGH (ref 0.0–17.9)
EBV VCA IgG: >600 U/mL — ABNORMAL HIGH (ref 0.0–17.9)
EBV VCA IgM: <36 U/mL (ref 0.0–35.9)

## 2019-08-08 LAB — PREPARE RBC (CROSSMATCH)

## 2019-08-08 LAB — GLUCOSE, CAPILLARY
Glucose-Capillary: 102 mg/dL — ABNORMAL HIGH (ref 70–99)
Glucose-Capillary: 92 mg/dL (ref 70–99)
Glucose-Capillary: 96 mg/dL (ref 70–99)
Glucose-Capillary: 94 mg/dL (ref 70–99)
Glucose-Capillary: 94 mg/dL (ref 70–99)

## 2019-08-08 LAB — ABO/RH: ABO/RH(D): O POS

## 2019-08-08 MED ORDER — CHLORHEXIDINE GLUCONATE 0.12% ORAL RINSE (MEDLINE KIT)
15.0000 mL | Freq: Two times a day (BID) | OROMUCOSAL | Status: DC
  Administered 2019-08-08 – 2019-08-11 (×7): 15 mL via OROMUCOSAL

## 2019-08-08 MED ORDER — ORAL CARE MOUTH RINSE
15.0000 mL | OROMUCOSAL | Status: DC
  Administered 2019-08-08 – 2019-08-11 (×31): 15 mL via OROMUCOSAL

## 2019-08-08 MED ORDER — SODIUM CHLORIDE 0.9% IV SOLUTION
Freq: Once | INTRAVENOUS | Status: AC
  Administered 2019-08-08: 08:00:00 via INTRAVENOUS

## 2019-08-08 NOTE — Progress Notes (Signed)
NAME:  Larry Lam, MRN:  [id number], DOB:  [birth year], LOS: 3 ADMISSION DATE:  08/05/2019, CONSULTATION DATE: 08/07/2019 REFERRING MD: TRH, CHIEF COMPLAINT: Status post arrest  Brief History   48 y.o.malewith medical history significant forrecent diagnosis of CLL/CML, acute on chronic respiratory failure on 2 L O2, COPD, seizures, diabetes mellitus, hypertension.  Admitted to [facility] on 12/10 with acute on chronic respiratory failure, pneumonia Had respiratory/cardiac arrest on 12/12 after he removed his nasal cannula.  Intubated and transferred to [facility]  Past Medical History  Asthma, COPD, diabetes, hypertension, CHF, CLL/CML, seizures, HTN  Significant Hospital Events   12/10 Admitted to [facility]  12/12 PEA arrest secondary to hypoxemia, transfer to [facility]  Consults:  PCCM  Procedures:  ETT 12/12 >  Femoral CVL 12/12 >  Significant Diagnostic Tests:  CTA 12/10-no pulmonary embolism, small effusion with scattered groundglass opacities.  Subcentimeter pulmonary nodule, bulky axillary, cervical, mediastinal and hilar lymph nodes.  CT abdomen 12/11-extensive adenopathy, splenomegaly, avascular necrosis of hips.  Micro Data:  12/10 Sars-CoV-2>>neg 12/10 Influenza A/B>>neg 12/13 tracheal aspirate  Antimicrobials:  Azithromycin 12/10 >> Cefepime 12/10 >>  Interim history/subjective:  Remains on the ventilator, has been hemodynamically stable Pressors weaning off  Objective   Blood pressure (!) 104/53, pulse 81, temperature 99.9 F (37.7 C), resp. rate 20, height 5\' 10"  (1.778 m), weight 101.3 kg, SpO2 97 %. CVP:  [10 mmHg-16 mmHg] 12 mmHg  Vent Mode: PRVC FiO2 (%):  [40 %-70 %] 40 % Set Rate:  [20 bmp] 20 bmp Vt Set:  [580 mL] 580 mL PEEP:  [5 cmH20] 5 cmH20 Plateau Pressure:  [20 cmH20-31 cmH20] 20 cmH20   Intake/Output Summary (Last 24 hours) at 08/08/2019 [id number] Last data filed at 08/08/2019 0800 Gross per 24 hour  Intake 1510.63 ml   Output 400 ml  Net 1110.63 ml   Filed Weights   08/05/19 0837 08/07/19 0400  Weight: 103 kg 101.3 kg    Examination: Gen:      No acute distress HEENT:  EOMI, sclera anicteric Neck:     No masses; no thyromegaly, ETT Lungs:    Clear to auscultation bilaterally; normal respiratory effort CV:         Regular rate and rhythm; no murmurs Abd:      + bowel sounds; soft, non-tender; no palpable masses, no distension Ext:    No edema; adequate peripheral perfusion Skin:      Warm and dry; no rash Neuro: Sedated, unresponsive, pupils reactive  Resolved Hospital Problem list   NA  Assessment & Plan:  PEA arrest Likely related to hypoxia when he took off his nasal cannula Weaning off pressors and sedation Monitor neuro status for recovery Follow echo  Acute respiratory failure in the setting of bilateral infiltrates, pneumonia Pulmonary nodules History of COPD Continue broad antibiotic coverage Follow cultures Pulmicort and duonebs PRN Follow-up nodules as outpatient  CLL with associated anemia, thrombocytopenia S/p PRBC, platelet transfusion No evidence of active bleed  Best practice:  Diet: Tube feeds Pain/Anxiety/Delirium protocol (if indicated): Propofol-Wean sedation VAP protocol (if indicated): Yes DVT prophylaxis: SCDs.  Holding heparin due to thrombocytopenia GI prophylaxis: PPI Glucose control: Monitor Mobility: Bedrest Code Status: Full Family Communication: Pending Disposition:ICU  Labs/imaging reviewed.  Significant for  BUN/creatinine 41/1.57, lactic acid 1.2 WBC 21, hemoglobin 6.5, platelets 24   Chest x-ray 12/13-ET tube in stable portion.  Mild bilateral infiltrates  Critical care time:   The patient is critically ill with  multiple organ system failure and requires high complexity decision making for assessment and support, frequent evaluation and titration of therapies, advanced monitoring, review of radiographic studies and interpretation of  complex data.   Critical Care Time devoted to patient care services, exclusive of separately billable procedures, described in this note is 35 minutes.   Marshell Garfinkel MD [facility] Please see [email or web address] for pager details.  08/08/2019, 10:01 AM

## 2019-08-08 NOTE — Progress Notes (Signed)
[facility] Progress Note Patient Name: Larry Lam DOB: [birth year] MRN: [id number]   Date of Service  08/08/2019  HPI/Events of Note  Hemoglobin 6.5 (down from 8.5). Platelets 24k.  eICU Interventions  Transfuse 1u pRBCs and 1u platelets. New T&S ordered.     Intervention Category Intermediate Interventions: Thrombocytopenia - evaluation and management Minor Interventions: Other:  Charlott Rakes 08/08/2019, 6:21 AM

## 2019-08-08 NOTE — Progress Notes (Signed)
[facility] Progress Note Patient Name: Larry Lam DOB: July 10, 1971 MRN: [id number]   Date of Service  08/08/2019  HPI/Events of Note  Patient from [facility] transported to [facility] after cardiac arrest in setting of COPD exacerbation during which he removed his oxygen/mask and had a hypoxemic/PEA arrest.  Patient is currently intubated, on minimal vasopressor support (phenylephrine 10 mcg/min). He is saturating 100% on vent settings: Vent Mode: PRVC FiO2 (%):  [45 %-70 %] 50 % Set Rate:  [16 bmp-20 bmp] 20 bmp Vt Set:  [580 mL] 580 mL PEEP:  [5 cmH20-8 cmH20] 5 cmH20 Pressure Support:  [8 cmH20] 8 cmH20 Plateau Pressure:  [22 [id number] cmH20] 22 cmH20  Most recent ABG: ABG    Component Value Date/Time   PHART 7.344 (L) 08/07/2019 1627   PCO2ART 69.6 ([place]) 08/07/2019 1627   PO2ART 190 (H) 08/07/2019 1627   HCO3 34.2 (H) 08/07/2019 1627   O2SAT 99.9 08/07/2019 1627   He is receiving cefepime/azithromycin for CAP to complete course. Also getting bronchodilators and budesonide for COPD.  eICU Interventions  No changes to plan at this time except that I will d/c the PRN labetolol IV order given the patient's residual shock state.  Monitor AKI. TTE in AM. Await neuro recovery.     Intervention Category Evaluation Type: New Patient Evaluation  Marily Lente Kyo Cocuzza 08/08/2019, 1:31 AM

## 2019-08-08 NOTE — Progress Notes (Signed)
CRITICAL VALUE ALERT  Critical Value:  Hgb 6.5, Plt 24  Date & Time Notied: 08/08/2019  0600  Provider Notified: Shea Evans MD  Orders Received/Actions taken: Waiting for new orders.

## 2019-08-08 NOTE — Progress Notes (Signed)
  Echocardiogram 2D Echocardiogram has been performed.  Heyden Jaber A Najae Rathert 08/08/2019, 11:09 AM

## 2019-08-08 NOTE — Progress Notes (Signed)
Attempted to wean vent per discussion w/ RN and per CCM plans.  Pt initially tol wean, however 2 minutes later there was a sudden desat episode to 78% rapidly.  Pt placed back on full vent support w/ 100% o2 breaths, sat now 95-96%.  RN in room during this and aware.

## 2019-08-08 NOTE — Progress Notes (Signed)
Pharmacy Antibiotic Note  Larry Lam is a 48 y.o. male admitted on 08/05/2019 with pneumonia now s/p cardiac arrest secondary to hypoxia when removing nasal cannula. CT scan showing bilateral infiltrates and pulmonary nodules. Pharmacy has been consulted for cefepime dosing. WBC remains elevated at 21.1 and temp 99.8.   Plan: Continue cefepime 2000 mg IV every 8 hours. Follow cultures, length of therapy, and renal function  Height: 5\' 10"  (177.8 cm) Weight: 223 lb 5.2 oz (101.3 kg) IBW/kg (Calculated) : 73  Temp (24hrs), Avg:99.4 F (37.4 C), Min:98.2 F (36.8 C), Max:100.1 F (37.8 C)  Recent Labs  Lab 08/05/19 0839 08/05/19 1648 08/06/19 0753 08/07/19 1141 08/07/19 1401 08/07/19 1728 08/08/19 0502  WBC 39.8*  --  28.3* 26.1* 97.2*  --  21.1*  CREATININE 0.69  --  0.73 0.71 1.00  --  1.57*  LATICACIDVEN  --  0.4*  --   --  3.3* 1.2  --     Estimated Creatinine Clearance: 67.9 mL/min (A) (by C-G formula based on SCr of 1.57 mg/dL (H)).    Allergies  Allergen Reactions  . Amlodipine Besylate-Valsartan Other (See Comments)    Lowered potassium  . Hydrochlorothiazide Other (See Comments)    Lowered potassium    Antimicrobials this admission: Vanco 12/10>>12/11 Cefepime 12/10>> Azithromycin 12/10>>  Dose adjustments this admission: N/A  Microbiology results: 12/10 MRSA PCR: negative 12/10 Resp PCR: negative 12/13 TA: pending  Thank you for allowing pharmacy to be a part of this patient's care.  Vertis Kelch, PharmD, [place] PGY2 Cardiology Pharmacy Resident Phone [phone] 08/08/2019       1:06 PM  Please check [email or web address] for unit-specific pharmacist phone numbers

## 2019-08-09 ENCOUNTER — Inpatient Hospital Stay (HOSPITAL_COMMUNITY): Payer: Medicare Other

## 2019-08-09 ENCOUNTER — Other Ambulatory Visit (HOSPITAL_COMMUNITY): Payer: Self-pay | Admitting: Hematology

## 2019-08-09 DIAGNOSIS — J449 Chronic obstructive pulmonary disease, unspecified: Secondary | ICD-10-CM

## 2019-08-09 DIAGNOSIS — I10 Essential (primary) hypertension: Secondary | ICD-10-CM

## 2019-08-09 DIAGNOSIS — J9610 Chronic respiratory failure, unspecified whether with hypoxia or hypercapnia: Secondary | ICD-10-CM

## 2019-08-09 DIAGNOSIS — J9601 Acute respiratory failure with hypoxia: Secondary | ICD-10-CM

## 2019-08-09 DIAGNOSIS — D513 Other dietary vitamin B12 deficiency anemia: Secondary | ICD-10-CM

## 2019-08-09 DIAGNOSIS — E119 Type 2 diabetes mellitus without complications: Secondary | ICD-10-CM

## 2019-08-09 DIAGNOSIS — Z87891 Personal history of nicotine dependence: Secondary | ICD-10-CM

## 2019-08-09 LAB — BODY FLUID CELL COUNT WITH DIFFERENTIAL
Eos, Fluid: 0 %
Lymphs, Fluid: 50 %
Monocyte-Macrophage-Serous Fluid: 1 % — ABNORMAL LOW (ref 50–90)
Neutrophil Count, Fluid: 49 % — ABNORMAL HIGH (ref 0–25)
Other Cells, Fluid: 0 %
Total Nucleated Cell Count, Fluid: 260 cu mm (ref 0–1000)

## 2019-08-09 LAB — GLUCOSE, CAPILLARY
Glucose-Capillary: 151 mg/dL — ABNORMAL HIGH (ref 70–99)
Glucose-Capillary: 152 mg/dL — ABNORMAL HIGH (ref 70–99)
Glucose-Capillary: 179 mg/dL — ABNORMAL HIGH (ref 70–99)
Glucose-Capillary: 187 mg/dL — ABNORMAL HIGH (ref 70–99)
Glucose-Capillary: 77 mg/dL (ref 70–99)
Glucose-Capillary: 81 mg/dL (ref 70–99)
Glucose-Capillary: 87 mg/dL (ref 70–99)

## 2019-08-09 LAB — CBC WITH DIFFERENTIAL/PLATELET
Abs Immature Granulocytes: 0.06 10*3/uL (ref 0.00–0.07)
Basophils Absolute: 0 10*3/uL (ref 0.0–0.1)
Basophils Relative: 0 %
Eosinophils Absolute: 0 10*3/uL (ref 0.0–0.5)
Eosinophils Relative: 0 %
HCT: 23 % — ABNORMAL LOW (ref 39.0–52.0)
Hemoglobin: 7.4 g/dL — ABNORMAL LOW (ref 13.0–17.0)
Immature Granulocytes: 0 %
Lymphocytes Relative: 92 %
Lymphs Abs: 20.1 10*3/uL — ABNORMAL HIGH (ref 0.7–4.0)
MCH: 29.8 pg (ref 26.0–34.0)
MCHC: 32.2 g/dL (ref 30.0–36.0)
MCV: 92.7 fL (ref 80.0–100.0)
Monocytes Absolute: 0.6 10*3/uL (ref 0.1–1.0)
Monocytes Relative: 3 %
Neutro Abs: 1 10*3/uL — ABNORMAL LOW (ref 1.7–7.7)
Neutrophils Relative %: 5 %
Platelets: 26 10*3/uL — CL (ref 150–400)
RBC: 2.48 MIL/uL — ABNORMAL LOW (ref 4.22–5.81)
RDW: 18.4 % — ABNORMAL HIGH (ref 11.5–15.5)
WBC: 21.8 10*3/uL — ABNORMAL HIGH (ref 4.0–10.5)
nRBC: 0.4 % — ABNORMAL HIGH (ref 0.0–0.2)

## 2019-08-09 LAB — PARVOVIRUS B19 ANTIBODY, IGG AND IGM
Parovirus B19 IgG Abs: 1.4 index — ABNORMAL HIGH (ref 0.0–0.8)
Parovirus B19 IgM Abs: 0.2 index (ref 0.0–0.8)

## 2019-08-09 LAB — BPAM PLATELET PHERESIS
Blood Product Expiration Date: 202012142359
ISSUE DATE / TIME: 202012131009
Unit Type and Rh: 6200

## 2019-08-09 LAB — POCT I-STAT 7, (LYTES, BLD GAS, ICA,H+H)
Acid-Base Excess: 13 mmol/L — ABNORMAL HIGH (ref 0.0–2.0)
Bicarbonate: 36.7 mmol/L — ABNORMAL HIGH (ref 20.0–28.0)
Calcium, Ion: 1.14 mmol/L — ABNORMAL LOW (ref 1.15–1.40)
HCT: 21 % — ABNORMAL LOW (ref 39.0–52.0)
Hemoglobin: 7.1 g/dL — ABNORMAL LOW (ref 13.0–17.0)
O2 Saturation: 97 %
Patient temperature: 99.3
Potassium: 3.8 mmol/L (ref 3.5–5.1)
Sodium: 140 mmol/L (ref 135–145)
TCO2: 38 mmol/L — ABNORMAL HIGH (ref 22–32)
pCO2 arterial: 43.7 mmHg (ref 32.0–48.0)
pH, Arterial: 7.534 — ABNORMAL HIGH (ref 7.350–7.450)
pO2, Arterial: 82 mmHg — ABNORMAL LOW (ref 83.0–108.0)

## 2019-08-09 LAB — CBC
HCT: 23.4 % — ABNORMAL LOW (ref 39.0–52.0)
Hemoglobin: 7.6 g/dL — ABNORMAL LOW (ref 13.0–17.0)
MCH: 30.6 pg (ref 26.0–34.0)
MCHC: 32.5 g/dL (ref 30.0–36.0)
MCV: 94.4 fL (ref 80.0–100.0)
Platelets: 25 10*3/uL — CL (ref 150–400)
RBC: 2.48 MIL/uL — ABNORMAL LOW (ref 4.22–5.81)
RDW: 18.5 % — ABNORMAL HIGH (ref 11.5–15.5)
WBC: 22 10*3/uL — ABNORMAL HIGH (ref 4.0–10.5)
nRBC: 0.4 % — ABNORMAL HIGH (ref 0.0–0.2)

## 2019-08-09 LAB — BPAM RBC
Blood Product Expiration Date: 202012292359
ISSUE DATE / TIME: 202012130800
Unit Type and Rh: 5100

## 2019-08-09 LAB — TYPE AND SCREEN
ABO/RH(D): O POS
Antibody Screen: NEGATIVE
Unit division: 0

## 2019-08-09 LAB — MAGNESIUM: Magnesium: 2.3 mg/dL (ref 1.7–2.4)

## 2019-08-09 LAB — PREPARE PLATELET PHERESIS: Unit division: 0

## 2019-08-09 LAB — BASIC METABOLIC PANEL
Anion gap: 10 (ref 5–15)
BUN: 45 mg/dL — ABNORMAL HIGH (ref 6–20)
CO2: 33 mmol/L — ABNORMAL HIGH (ref 22–32)
Calcium: 8.5 mg/dL — ABNORMAL LOW (ref 8.9–10.3)
Chloride: 98 mmol/L (ref 98–111)
Creatinine, Ser: 1.28 mg/dL — ABNORMAL HIGH (ref 0.61–1.24)
GFR calc Af Amer: >60 mL/min (ref >60)
GFR calc non Af Amer: >60 mL/min (ref >60)
Glucose, Bld: 88 mg/dL (ref 70–99)
Potassium: 3.9 mmol/L (ref 3.5–5.1)
Sodium: 141 mmol/L (ref 135–145)

## 2019-08-09 LAB — PHOSPHORUS: Phosphorus: 2.4 mg/dL — ABNORMAL LOW (ref 2.5–4.6)

## 2019-08-09 LAB — TRIGLYCERIDES: Triglycerides: 422 mg/dL — ABNORMAL HIGH (ref <150)

## 2019-08-09 LAB — BRAIN NATRIURETIC PEPTIDE: B Natriuretic Peptide: 108.1 pg/mL — ABNORMAL HIGH (ref 0.0–100.0)

## 2019-08-09 MED ORDER — DEXMEDETOMIDINE HCL IN NACL 400 MCG/100ML IV SOLN
0.4000 ug/kg/h | INTRAVENOUS | Status: DC
  Administered 2019-08-09: 1.2 ug/kg/h via INTRAVENOUS
  Administered 2019-08-09: 0.4 ug/kg/h via INTRAVENOUS
  Administered 2019-08-09 – 2019-08-11 (×10): 1.2 ug/kg/h via INTRAVENOUS
  Administered 2019-08-11: 0.6 ug/kg/h via INTRAVENOUS
  Administered 2019-08-11: 1.2 ug/kg/h via INTRAVENOUS
  Filled 2019-08-09 (×6): qty 100
  Filled 2019-08-09: qty 300
  Filled 2019-08-09 (×2): qty 100
  Filled 2019-08-09: qty 200
  Filled 2019-08-09 (×2): qty 100

## 2019-08-09 MED ORDER — METHYLPREDNISOLONE SODIUM SUCC 125 MG IJ SOLR
60.0000 mg | Freq: Four times a day (QID) | INTRAMUSCULAR | Status: DC
  Administered 2019-08-09 – 2019-08-11 (×8): 60 mg via INTRAVENOUS
  Filled 2019-08-09 (×8): qty 2

## 2019-08-09 MED ORDER — FENTANYL CITRATE (PF) 100 MCG/2ML IJ SOLN: 50.0000 ug | INTRAMUSCULAR | Status: DC | PRN

## 2019-08-09 MED ORDER — MIDAZOLAM HCL 2 MG/2ML IJ SOLN: 2.0000 mg | Freq: Once | INTRAMUSCULAR | Status: AC

## 2019-08-09 MED ORDER — FENTANYL CITRATE (PF) 100 MCG/2ML IJ SOLN
50.0000 ug | INTRAMUSCULAR | Status: DC | PRN
  Administered 2019-08-09: 18:00:00 200 ug via INTRAVENOUS
  Filled 2019-08-09: qty 4

## 2019-08-09 MED ORDER — FENTANYL CITRATE (PF) 100 MCG/2ML IJ SOLN
100.0000 ug | Freq: Once | INTRAMUSCULAR | Status: AC
  Administered 2019-08-09: 100 ug via INTRAVENOUS

## 2019-08-09 MED ORDER — DOCUSATE SODIUM 50 MG/5ML PO LIQD
100.0000 mg | Freq: Two times a day (BID) | ORAL | Status: DC | PRN
  Filled 2019-08-09: qty 10

## 2019-08-09 MED ORDER — BISACODYL 10 MG RE SUPP: 10.0000 mg | Freq: Every day | RECTAL | Status: DC | PRN

## 2019-08-09 MED ORDER — ETOMIDATE 2 MG/ML IV SOLN
20.0000 mg | Freq: Once | INTRAVENOUS | Status: AC
  Administered 2019-08-09: 13:00:00 20 mg via INTRAVENOUS

## 2019-08-09 MED ORDER — ROCURONIUM BROMIDE 50 MG/5ML IV SOLN
50.0000 mg | Freq: Once | INTRAVENOUS | Status: AC
  Administered 2019-08-09: 50 mg via INTRAVENOUS

## 2019-08-09 MED ORDER — MIDAZOLAM HCL 2 MG/2ML IJ SOLN
INTRAMUSCULAR | Status: AC
  Administered 2019-08-09: 13:00:00 2 mg via INTRAVENOUS
  Filled 2019-08-09: qty 2

## 2019-08-09 MED ORDER — FENTANYL CITRATE (PF) 100 MCG/2ML IJ SOLN
INTRAMUSCULAR | Status: AC
  Filled 2019-08-09: qty 2

## 2019-08-09 MED FILL — Medication: Qty: 1 | Status: AC

## 2019-08-09 NOTE — Plan of Care (Signed)

## 2019-08-09 NOTE — Consult Note (Addendum)
[place]  [phone] Fax:(336) [phone]   MEDICAL ONCOLOGY - INITIAL CONSULTATION  Referral MD: Dr. 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  The patient was recently admitted to [facility] from 07/30/2022 08/01/2019 for acute anemia and thrombocytopenia.  He received 3 units PRBCs and platelets and was discharged with recommended follow-up with his oncologist.  He was seen in the emergency room again on 08/02/2019 for difficulty breathing that was transient.  There was concern for panic attack versus COPD flare.  Since he was at his baseline, he was discharged home.  He again presented to the emergency room at [facility] on 08/05/2019 with difficulty breathing.  In the ER, he was tachycardic and tachypneic.  O2 sats were 40% on 3 L via nasal cannula and he was placed on a nonrebreather and subsequently switched to high flow nasal cannula.  Chest x-ray showed increasing patchy airspace infiltrate laterally at the left lung base suspicious for pneumonia, probable mediastinal adenopathy, diffuse interstitial markings throughout both lungs as before.  He then had a CT angiogram of the chest on 08/05/2019.  CT angiogram of the chest showed no PE, small bilateral pleural effusions with diffuse bilateral interlobular septal thickening and scattered groundglass opacity throughout the lungs-findings consistent with infection or edema, occasional small pulmonary nodules which were new or enlarged wound compared to the prior CT-these were thought to be infectious or inflammatory, numerous bulky bilateral axillary, lower cervical, mediastinal, and hilar lymph nodes.  A CT of the abdomen pelvis was performed on 08/06/2019 which showed moderate to large  bilateral pleural effusions with adjacent atelectasis, extensive adenopathy throughout the abdomen and pelvis similar to slightly improved when compared to the prior CT in September 2020, splenomegaly, avascular necrosis of the bilateral hips.  On 08/07/2019, the patient removed his nasal cannula and a CODE BLUE was called. He received 2 doses of epinephrine serial bicarbonate and CPR.  He was found by the emergency room physician to be bradycardic minimally responsive and this degenerated into a PEA arrest.  He was aggressively resuscitated.  The patient was transferred to [facility] via [place].  With regards to the patient's history of CLL, he has been followed by Dr. Tasia Catchings at [facility].  It appears as though he was diagnosed in August 2020 when he noticed enlarged lymph nodes in his neck.  However, he has had a prolonged history of mediastinal and hilar lymphadenopathy since June 2010.  Peripheral blood flow cytometry in August 2020 showed CD5 positive, CD23 positive lymphocytes consistent with CLL.  CLL FISH panel showed ATM mutation, T p53 by FISH was negative, GVH mutation positive.  He was placed on observation.  He had a video visit on 08/02/2019 and due to his development of significant constitutional symptoms and acute drop in hemoglobin and platelet count, there was concern for disease progression.  Additional work-up was in progress including flow cytometry and PET scan.  Flow cytometry performed on 08/03/2019 showed CLL/SLL, B-cell, CD38 expression indeterminate for prognosis (21%) the PET scan had not yet been completed at the time of admission.  While hospitalized at [facility], a bone marrow biopsy was recommended.  This was initially scheduled to be performed today, but was placed on hold.  Review of the patient's chart  shows that he was under consideration to begin treatment with venetoclax and obinutuzumab pending completion of his work-up.  When seen today, the patient is currently  on the ventilator but is able to answer questions.  His wife is at the bedside.  He was extubated briefly earlier today but reintubated due to increased work of breathing, elevated heart rate, and elevated respiratory rate.  The patient reports prior to admission, that he had anorexia and a weight loss of about 70 pounds.  He denied having any night sweats.  He has noticed enlarged lymph nodes particularly in his right neck.  He has not had any fevers or chills.  He denies headaches and vision changes.  He denies chest discomfort.  He has been short of breath and has needed increased O2 at home prior to his admission.  Denies cough and hemoptysis.  Denies abdominal pain, nausea, vomiting, constipation, diarrhea.  He has noticed some bright red blood after bowel movement.  Denies any other bleeding.  The patient is married.  He lives in [place], [place].  He has a history of tobacco use and quit in July 2020.  Prior to that, he smoked 1 pack of cigarettes per day for 33 years.  Reports rare alcohol use.  Family history significant for maternal grandfather with lung cancer.  Medical oncology was asked see the patient to make recommendations regarding his progressive CLL.   Past Medical History:  Diagnosis Date  . Asthma   . COPD (chronic obstructive pulmonary disease) ([place])   . Diabetes mellitus without complication ([place])   . Hypertension   . Seizures ([place])    3-4 years ago. Single episode.  . Tobacco use   :  History reviewed. No pertinent surgical history.:  Current Facility-Administered Medications  Medication Dose Route Frequency Provider Last Rate Last Admin  . 0.9 %  sodium chloride infusion  250 mL Intravenous Continuous Enzo Bi, MD      . acetaminophen (TYLENOL) tablet 650 mg  650 mg Oral Q6H PRN Enzo Bi, MD       Or  . acetaminophen (TYLENOL) suppository 650 mg  650 mg Rectal Q6H PRN Enzo Bi, MD      . bisacodyl (DULCOLAX) suppository 10 mg  10 mg Rectal Daily PRN Jennelle Human B, NP      . budesonide (PULMICORT) nebulizer solution 0.5 mg  0.5 mg Inhalation BID Enzo Bi, MD   0.5 mg at 08/09/19 0802  . chlorhexidine gluconate (MEDLINE KIT) (PERIDEX) 0.12 % solution 15 mL  15 mL Mouth Rinse BID Enzo Bi, MD   15 mL at 08/09/19 0900  . Chlorhexidine Gluconate Cloth 2 % PADS 6 each  6 each Topical Daily Enzo Bi, MD   6 each at 08/07/19 1100  . Chlorhexidine Gluconate Cloth 2 % PADS 6 each  6 each Topical Q0600 Enzo Bi, MD   6 each at 08/09/19 0606  . dexmedetomidine (PRECEDEX) 400 MCG/100ML (4 mcg/mL) infusion  0.4-1.2 mcg/kg/hr Intravenous Titrated Candee Furbish, MD 30.3 mL/hr at 08/09/19 1400 1.2 mcg/kg/hr at 08/09/19 1400  . docusate (COLACE) 50 MG/5ML liquid 100 mg  100 mg Per Tube BID PRN Jennelle Human B, NP      . fentaNYL (SUBLIMAZE) injection 50 mcg  50 mcg Intravenous Q15 min PRN Jennelle Human B, NP      . fentaNYL (SUBLIMAZE) injection 50-200 mcg  50-200 mcg Intravenous Q30 min PRN Arnell Asal, NP      . ipratropium-albuterol (DUONEB)  0.5-2.5 (3) MG/3ML nebulizer solution 3 mL  3 mL Nebulization Q4H PRN Enzo Bi, MD      . MEDLINE mouth rinse  15 mL Mouth Rinse 10 times per day Enzo Bi, MD   15 mL at 08/09/19 1200  . methylPREDNISolone sodium succinate (SOLU-MEDROL) 125 mg/2 mL injection 60 mg  60 mg Intravenous Q6H Candee Furbish, MD      . ondansetron [facility]) tablet 4 mg  4 mg Oral Q6H PRN Enzo Bi, MD       Or  . ondansetron [facility]) injection 4 mg  4 mg Intravenous Q6H PRN Enzo Bi, MD      . pantoprazole (PROTONIX) injection 40 mg  40 mg Intravenous Q24H Enzo Bi, MD   40 mg at 08/08/19 1738  . phenylephrine (NEOSYNEPHRINE) 10-0.9 MG/250ML-% infusion  25-200 mcg/min Intravenous Titrated Enzo Bi, MD   Stopped at 08/08/19 [phone]  . polyethylene glycol (MIRALAX / GLYCOLAX) packet 17 g  17 g Oral Daily PRN Enzo Bi, MD      . sodium chloride flush (NS) 0.9 % injection 10-40 mL  10-40 mL Intracatheter Q12H Enzo Bi, MD   10 mL at  08/09/19 1051  . sodium chloride flush (NS) 0.9 % injection 10-40 mL  10-40 mL Intracatheter PRN Enzo Bi, MD      . sodium chloride flush (NS) 0.9 % injection 10-40 mL  10-40 mL Intracatheter Q12H Enzo Bi, MD   10 mL at 08/09/19 1051  . sodium chloride flush (NS) 0.9 % injection 10-40 mL  10-40 mL Intracatheter PRN Enzo Bi, MD         Allergies  Allergen Reactions  . Amlodipine Besylate-Valsartan Other (See Comments)    Lowered potassium  . Hydrochlorothiazide Other (See Comments)    Lowered potassium  :  Family History  Problem Relation Age of Onset  . Lung cancer Maternal Grandfather   :  Social History   Socioeconomic History  . Marital status: Married    Spouse name: Not on file  . Number of children: Not on file  . Years of education: Not on file  . Highest education level: Not on file  Occupational History  . Not on file  Tobacco Use  . Smoking status: Former Smoker    Packs/day: 1.00    Years: 33.00    Pack years: 33.00    Types: Cigarettes    Quit date: 03/23/2019    Years since quitting: 0.3  . Smokeless tobacco: Former Network engineer and Sexual Activity  . Alcohol use: Yes    Alcohol/week: 0.0 standard drinks    Comment: rare  . Drug use: No    Comment: smokes pot  . Sexual activity: Not on file  Other Topics Concern  . Not on file  Social History Narrative  . Not on file   Social Determinants of Health   Financial Resource Strain:   . Difficulty of Paying Living Expenses: Not on file  Food Insecurity:   . Worried About Charity fundraiser in the Last Year: Not on file  . Ran Out of Food in the Last Year: Not on file  Transportation Needs:   . Lack of Transportation (Medical): Not on file  . Lack of Transportation (Non-Medical): Not on file  Physical Activity:   . Days of Exercise per Week: Not on file  . Minutes of Exercise per Session: Not on file  Stress:   . Feeling of Stress : Not on file  Social Connections:   . Frequency of  Communication with Friends and Family: Not on file  . Frequency of Social Gatherings with Friends and Family: Not on file  . Attends Religious Services: Not on file  . Active Member of Clubs or Organizations: Not on file  . Attends Archivist Meetings: Not on file  . Marital Status: Not on file  Intimate Partner Violence:   . Fear of Current or Ex-Partner: Not on file  . Emotionally Abused: Not on file  . Physically Abused: Not on file  . Sexually Abused: Not on file  :  Review of Systems: A comprehensive 14 point review of systems was negative except as noted in the HPI.  Exam: Patient Vitals for the past 24 hrs:  BP Temp Temp src Pulse Resp SpO2  08/09/19 1400 (!) 152/73 -- -- (!) 101 20 99 %  08/09/19 1300 (!) 169/76 -- -- (!) 125 (!) 29 98 %  08/09/19 1228 -- 98 F (36.7 C) Axillary -- -- --  08/09/19 1212 (!) 198/113 -- -- (!) 142 (!) 35 95 %  08/09/19 1158 -- -- -- (!) 144 (!) 24 94 %  08/09/19 1100 (!) 175/88 -- -- (!) 121 (!) 22 95 %  08/09/19 1055 (!) 175/88 -- -- (!) 109 (!) 24 94 %  08/09/19 1000 122/64 -- -- 97 20 94 %  08/09/19 0900 (!) 168/77 -- -- (!) 128 14 95 %  08/09/19 0830 -- 99.6 F (37.6 C) Oral -- -- --  08/09/19 0828 (!) 156/79 -- -- (!) 110 (!) 24 96 %  08/09/19 0800 127/60 -- -- 73 20 97 %  08/09/19 0700 (!) 112/52 -- -- 63 20 96 %  08/09/19 0600 (!) 111/52 -- -- 66 20 96 %  08/09/19 0512 (!) 112/54 -- -- 68 20 97 %  08/09/19 0500 (!) 112/54 -- -- 63 20 97 %  08/09/19 0400 (!) 113/54 -- -- 64 20 97 %  08/09/19 0300 (!) 117/54 99.3 F (37.4 C) Oral 64 20 95 %  08/09/19 0200 (!) 112/52 -- -- 63 20 96 %  08/09/19 0100 (!) 121/52 -- -- 67 20 97 %  08/09/19 0000 (!) 109/54 -- -- 63 20 97 %  08/08/19 2355 (!) 106/56 -- -- 64 20 96 %  08/08/19 2300 (!) 106/56 -- -- 63 20 100 %  08/08/19 2200 (!) 114/52 -- -- 68 20 96 %  08/08/19 2100 (!) 113/50 -- -- 69 20 96 %  08/08/19 2022 (!) 117/50 -- -- 71 20 96 %  08/08/19 2000 (!) 117/50 -- -- 68 20  96 %  08/08/19 1900 (!) 118/51 100 F (37.8 C) Oral 68 20 96 %  08/08/19 1800 (!) 118/51 -- -- 71 20 96 %  08/08/19 1700 (!) 130/55 -- -- 73 20 96 %  08/08/19 1646 -- 99.4 F (37.4 C) Axillary -- -- --  08/08/19 1608 (!) 118/55 -- -- 71 (!) 22 97 %  08/08/19 1600 (!) 118/55 -- -- 74 19 98 %    General: Chronically ill-appearing male, on the ventilator, alert and able answer questions Eyes:  PERRL, no scleral icterus   ENT:  ET tube in place Neck was without thyromegaly.   Lymphatics: Palpable bilateral cervical lymph nodes, largest on the right measuring approximately 1.5 cm.  Palpable axillary and inguinal lymphadenopathy. Respiratory: lungs were clear bilaterally without wheezing or crackles.   Cardiovascular:  Regular rate and rhythm, S1/S2, without murmur, rub  or gallop.  There was no pedal edema.   GI:  abdomen was soft, flat, nontender, nondistended, no hepatomegaly, spleen enlarged. Musculoskeletal: Moves all extremities x4 Skin exam was without echymosis, petichae.   Neuro exam was nonfocal. Patient was alert and oriented.  Attention was good.       Lab Results  Component Value Date   WBC 22.0 (H) 08/09/2019   WBC 21.8 (H) 08/09/2019   HGB 7.6 (L) 08/09/2019   HGB 7.4 (L) 08/09/2019   HCT 23.4 (L) 08/09/2019   HCT 23.0 (L) 08/09/2019   PLT 25 (LL) 08/09/2019   PLT 26 (LL) 08/09/2019   GLUCOSE 88 08/09/2019   CHOL 223 (H) 07/06/2012   TRIG 422 (H) 08/09/2019   HDL 26 (L) 07/06/2012   LDLCALC 117 (H) 07/06/2012   ALT 22 08/08/2019   AST 23 08/08/2019   NA 141 08/09/2019   K 3.9 08/09/2019   CL 98 08/09/2019   CREATININE 1.28 (H) 08/09/2019   BUN 45 (H) 08/09/2019   CO2 33 (H) 08/09/2019    DG Chest 1 View  Result Date: 08/09/2019 CLINICAL DATA:  ETT placement EXAM: CHEST  1 VIEW COMPARISON:  Radiographs 08/09/2019 FINDINGS: Endotracheal tube in the mid trachea, 4.5 cm from the carina. Transesophageal tube tip beyond the GE junction curling in the left upper  quadrant, terminating below the level of imaging. Diffuse interstitial pulmonary opacity throughout both lungs with few more patchy areas of opacity. Some right basilar pleural thickening compatible with fusion. Suspect at least trace left effusion as well. Cardiomediastinal contours are stable. No acute osseous or soft tissue abnormality. IMPRESSION: 1. Endotracheal tube 4.5 cm from the carina. 2. Transesophageal tube tip curls in the left upper quadrant, terminating below the level of imaging. 3. Diffuse interstitial airspace opacities compatible with infection and/or edema. Overall similar in extent to comparison studies. Electronically Signed   By: Lovena Le M.D.   On: 08/09/2019 14:40   DG Chest 2 View  Result Date: 08/02/2019 CLINICAL DATA:  Shortness of breath, recent discharge, CLL EXAM: CHEST - 2 VIEW COMPARISON:  Chest CT 04/30/2019, chest radiograph 08/01/2019 FINDINGS: Diffusely increased interstitial markings are again seen. No pneumothorax. More nodular masslike opacity seen in the left costophrenic sulcus possibly reflecting some prominent lymph nodes in the pericardial fat pad on comparison chest CT. Additional ovoid opacities are seen in the soft tissues of the axilla and base of the neck. Bilateral effusions are noted. Cardiomediastinal contours are similar to prior with nodular densities in the hila likely reflecting adenopathy. No acute osseous abnormality. Degenerative changes in the shoulders. IMPRESSION: Nonspecific increased interstitial markings with bilateral effusions. Could reflect atypical infection or edema. Nodular opacity in the left costophrenic sulcus may reflect adenopathy within the pericardial fat. Additional nodular opacities in the hila and axilla likely reflecting further adenopathy in the setting of CLL. Electronically Signed   By: Lovena Le M.D.   On: 08/02/2019 22:13   DG Chest 2 View  Result Date: 07/31/2019 CLINICAL DATA:  Shortness of breath. EXAM: CHEST -  2 VIEW COMPARISON:  April 27, 2017 FINDINGS: Cardiomediastinal silhouette is stable. No pneumothorax. No nodules or masses. Increased lung markings bilaterally with interstitial components. There is a small left pleural effusion. IMPRESSION: 1. Increase lung markings centrally with interstitial components suggestive of atypical infection/pneumonia. Bronchitis is a possibility. There is a small associated left effusion. Electronically Signed   By: Dorise Bullion III M.D   On: 07/31/2019 02:34   CT Angio  Chest PE W/Cm &/Or Wo Cm  Result Date: 08/05/2019 CLINICAL DATA:  Shortness of breath, CLL EXAM: CT ANGIOGRAPHY CHEST WITH CONTRAST TECHNIQUE: Multidetector CT imaging of the chest was performed using the standard protocol during bolus administration of intravenous contrast. Multiplanar CT image reconstructions and MIPs were obtained to evaluate the vascular anatomy. CONTRAST:  123m OMNIPAQUE IOHEXOL 350 MG/ML SOLN COMPARISON:  CT chest abdomen pelvis, 04/30/2011 FINDINGS: Cardiovascular: Satisfactory opacification of the pulmonary arteries to the segmental level. No evidence of pulmonary embolism. Normal heart size. No pericardial effusion. Mediastinum/Nodes: Numerous bulky bilateral axillary, lower cervical, mediastinal, and hilar lymph nodes, similar in size compared to CT dated 04/30/2019. Thyroid gland, trachea, and esophagus demonstrate no significant findings. Lungs/Pleura: Small bilateral pleural effusions. Diffuse bilateral interlobular septal thickening and scattered ground-glass opacities throughout the lungs. There are occasional small pulmonary nodules, which are new or enlarged when compared to prior CT. A nodule of the right pulmonary apex measures 7 mm, previously 3 mm when measured similarly (series 7, image 24). A new nodule of the right lower lobe measures 6 mm (series 7, image 84). Upper Abdomen: No acute abnormality. There is redemonstrated bulky celiac axis and gastrohepatic ligament  lymphadenopathy, again similar to prior examination dated 04/30/2019 to the extent imaged. Musculoskeletal: No chest wall abnormality. No acute or significant osseous findings. Review of the MIP images confirms the above findings. IMPRESSION: 1.  Negative examination for pulmonary embolism. 2. Small bilateral pleural effusions with diffuse bilateral interlobular septal thickening and scattered ground-glass opacity throughout the lungs. These findings are consistent with infection or edema. 3. There are occasional small pulmonary nodules, which are new or enlarged when compared to prior CT. A nodule of the right pulmonary apex measures 7 mm, previously 3 mm when measured similarly (series 7, image 24). A new nodule of the right lower lobe measures 6 mm (series 7, image 84). These are most likely infectious or inflammatory; pulmonary nodules are an unlikely manifestation of pulmonary involvement of lymphoma. 4. Numerous bulky bilateral axillary, lower cervical, mediastinal, and hilar lymph nodes, as well as bulky lymph nodes in the partially imaged upper abdomen, similar in size compared to CT dated 04/30/2019 and in keeping with diagnosis of CLL. Electronically Signed   By: AEddie CandleM.D.   On: 08/05/2019 15:26   CT ABDOMEN PELVIS W CONTRAST  Result Date: 08/06/2019 CLINICAL DATA:  CLL/SLL. EXAM: CT ABDOMEN AND PELVIS WITH CONTRAST TECHNIQUE: Multidetector CT imaging of the abdomen and pelvis was performed using the standard protocol following bolus administration of intravenous contrast. CONTRAST:  1044mOMNIPAQUE IOHEXOL 300 MG/ML  SOLN COMPARISON:  04/30/2019 FINDINGS: Lower chest: There are moderate to large bilateral pleural effusions that are only partially visualized on this exam.The heart size is borderline enlarged. There are few ground-glass airspace opacities at the lung bases with interlobular septal thickening suggestive of volume overload/developing pulmonary edema. Hepatobiliary: The liver is  normal. There is layering hyperdense material within the gallbladder lumen. This may represent gallbladder sludge or vicarious excretion of contrast from the patient's prior contrast enhanced CT.There is no biliary ductal dilation. Pancreas: Normal contours without ductal dilatation. No peripancreatic fluid collection. Spleen: The spleen is significantly enlarged Adrenals/Urinary Tract: --Adrenal glands: No adrenal hemorrhage. --Right kidney/ureter: No hydronephrosis or perinephric hematoma. --Left kidney/ureter: No hydronephrosis or perinephric hematoma. --Urinary bladder: Unremarkable. Stomach/Bowel: --Stomach/Duodenum: No hiatal hernia or other gastric abnormality. Normal duodenal course and caliber. --Small bowel: No dilatation or inflammation. --Colon: No focal abnormality. --Appendix: Normal. Vascular/Lymphatic: Atherosclerotic calcification is  present within the non-aneurysmal abdominal aorta, without hemodynamically significant stenosis. --there is extensive retroperitoneal adenopathy --there is extensive mesenteric adenopathy --there is extensive pelvic and inguinal adenopathy. Overall, the adenopathy appears to be relatively stable to slightly improved when compared to prior study. Reproductive: Unremarkable Other: No ascites or free air. The abdominal wall is normal. Musculoskeletal. 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 There is no acute displaced fracture. IMPRESSION: 1. Moderate to large bilateral pleural effusions with adjacent atelectasis. 2. Extensive adenopathy throughout the abdomen and pelvis, similar to slightly improved when compared to September 2020 CT. 3. Splenomegaly. 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 Aortic Atherosclerosis (ICD10-I70.0). Electronically Signed   By: Constance Holster M.D.   On: 08/06/2019 22:34   DG Chest Port 1 View  Result Date: 08/09/2019 CLINICAL DATA:  Endotracheal tube EXAM: PORTABLE CHEST 1 VIEW  COMPARISON:  08/08/2019 FINDINGS: No significant interval change in AP portable examination with endotracheal tube and esophagogastric tube in position. There remains mild, diffuse interstitial pulmonary opacity throughout, consistent with mild edema or infection. No new or focal airspace opacity. The heart and mediastinum are unremarkable. IMPRESSION: No significant interval change in diffuse interstitial opacity throughout both lungs, consistent with mild edema or infection. Electronically Signed   By: Eddie Candle M.D.   On: 08/09/2019 08:07   DG CHEST PORT 1 VIEW  Result Date: 08/08/2019 CLINICAL DATA:  48 year old male status post cardiac arrest yesterday. History of CLL and suspected recent pneumonia. Negative for COVID-19. EXAM: PORTABLE CHEST 1 VIEW COMPARISON:  08/07/2019 and earlier. FINDINGS: Portable AP semi upright view at 0531 hours. Endotracheal tube tip in good position between the level the clavicles and carina. Enteric tube courses to the abdomen, tip not included. Course and asymmetric bilateral pulmonary interstitial opacity with regressed more confluent perihilar opacity since yesterday. The ventilation now resembles that on 08/05/2019. There is no longer confluent opacity at the left lateral lung base. No pneumothorax or pleural effusion. Paucity of bowel gas. No acute osseous abnormality identified. IMPRESSION: 1. Satisfactory lines and tubes. 2. Improved ventilation since yesterday with regressed perihilar opacity. Continued coarse interstitial opacity resembling that on 08/05/2019 which was also demonstrated by CTA at that time. 3. No new cardiopulmonary abnormality. Electronically Signed   By: Genevie Ann M.D.   On: 08/08/2019 10:48   DG CHEST PORT 1 VIEW  Result Date: 08/07/2019 CLINICAL DATA:  Tube placement, code EXAM: PORTABLE CHEST 1 VIEW COMPARISON:  06/05/2019 FINDINGS: Interval placement of endotracheal tube, tip projecting over the mid trachea. Esophagogastric tube, poorly  visualized due to underpenetration although appears to be with tip and side port below the diaphragm. There is mild, diffuse interstitial pulmonary opacity. Layering pleural effusions seen on prior CT are not well appreciated. No new airspace opacity. IMPRESSION: 1. Interval placement of endotracheal tube, tip projecting over the mid trachea. 2. Esophagogastric tube, poorly visualized due to underpenetration although appears to be with tip and side port below the diaphragm. 3. There is mild, diffuse interstitial pulmonary opacity. Layering pleural effusions seen on prior CT are not well appreciated. No new airspace opacity. Electronically Signed   By: Eddie Candle M.D.   On: 08/07/2019 14:10   DG Chest Port 1 View  Result Date: 08/05/2019 CLINICAL DATA:  PT c/o worsening SOB since last night. Pt states he is normally somewhat SOB at baseline. Hx HTN, diabetes, COPD, asthma, former smoker EXAM: PORTABLE CHEST - 1 VIEW COMPARISON:  08/02/2019 FINDINGS: Increasing  patchy airspace infiltrate laterally at the left lung base. Diffuse interstitial markings throughout both lungs as before. Heart size normal. Right paratracheal soft tissue consistent with adenopathy. The aortopulmonary window is obscured suggesting adenopathy or mass. Blunting of left lateral costophrenic angle. No pneumothorax. Visualized bones unremarkable. IMPRESSION: 1. Increasing patchy airspace infiltrate laterally at the left lung base suspicious for pneumonia. 2. Probable mediastinal adenopathy. 3. Diffuse interstitial markings throughout both lungs as before. Electronically Signed   By: Lucrezia Europe M.D.   On: 08/05/2019 09:12   DG CHEST PORT 1 VIEW  Result Date: 08/01/2019 CLINICAL DATA:  48 year old male with weakness. Acute on chronic anemia, thrombocytopenia. Chronic lymphocytic leukemia. Negative for COVID-19. Yesterday. EXAM: PORTABLE CHEST 1 VIEW COMPARISON:  Chest radiographs 07/31/2019 and earlier. FINDINGS: Portable AP upright view  at 1518 hours. Stable lung volumes and mediastinal contours. Mediastinal lymphadenopathy demonstrated by CT in September likely persists. Stable mild bilateral nonspecific pulmonary interstitial markings, slightly greater on the right. No superimposed pneumothorax, pleural effusion or consolidation. Visualized tracheal air column is within normal limits. Negative visible bowel gas pattern. No acute osseous abnormality identified. IMPRESSION: 1. Persistent nonspecific pulmonary interstitial markings greater on the right. Doubt pulmonary edema. Atypical infection remains possible. But this might be related to the patient's CLL given suggestion of some nonspecific interstitial opacity by CT in September. 2. Evidence of mediastinal lymphadenopathy stable since September. 3. No new cardiopulmonary abnormality since yesterday. Electronically Signed   By: Genevie Ann M.D.   On: 08/01/2019 16:59   DG Abd Portable 1V  Result Date: 08/09/2019 CLINICAL DATA:  OG tube placement EXAM: PORTABLE ABDOMEN - 1 VIEW COMPARISON:  Concurrent chest radiograph, CT abdomen pelvis 08/06/2019 FINDINGS: Tip and side port of the transesophageal tube or distal to the GE junction, terminating in the region of the gastric antrum/duodenal bulb. Bowel gas pattern is nonobstructive. No acute soft tissue abnormality is seen. Osseous structures are unremarkable. IMPRESSION: 1. Tip of the transesophageal tube in the region of the gastric antrum/duodenal bulb. 2. Nonobstructive bowel gas pattern. Electronically Signed   By: Lovena Le M.D.   On: 08/09/2019 14:41   ECHOCARDIOGRAM COMPLETE  Result Date: 08/08/2019   ECHOCARDIOGRAM REPORT   Patient Name:   Larry Lam Date of Exam: 08/08/2019 Medical Rec #:  [id number]            Height:       70.0 in Accession #:    [id number]           Weight:       223.3 lb Date of Birth:  [birth year]            BSA:          2.19 m Patient Age:    70 years             BP:           119/61 mmHg Patient  Gender: M                    HR:           68 bpm. Exam Location:  Inpatient Procedure: 2D Echo Indications:    Cardiac arrest I46.9  History:        Patient has prior history of Echocardiogram examinations, most                 recent 04/28/2017. COPD, Signs/Symptoms:Hypotension; Risk  Factors:Diabetes. Acute on chronic respiratory failure.  Sonographer:    Vikki Ports Turrentine Referring Phys: [id number] Francesca Jewett  Sonographer Comments: Technically difficult study due to poor echo windows and echo performed with patient supine and on artificial respirator. Image acquisition challenging due to patient body habitus. IMPRESSIONS  1. Left ventricular ejection fraction, by visual estimation, is 55 to 60%. The left ventricle has normal function. There is no left ventricular hypertrophy.  2. Left ventricular diastolic parameters are consistent with Grade I diastolic dysfunction (impaired relaxation).  3. The left ventricle has no regional wall motion abnormalities.  4. Global right ventricle has mildly reduced systolic function.The right ventricular size is normal. No increase in right ventricular wall thickness.  5. Left atrial size was mildly dilated.  6. Right atrial size was normal.  7. Trivial pericardial effusion is present.  8. The mitral valve is grossly normal. Trivial mitral valve regurgitation.  9. The tricuspid valve is grossly normal. Tricuspid valve regurgitation is trivial. 10. The aortic valve is tricuspid. Aortic valve regurgitation is not visualized. 11. The pulmonic valve was grossly normal. Pulmonic valve regurgitation is trivial. 12. Moderately elevated pulmonary artery systolic pressure. 13. The inferior vena cava is dilated in size with <50% respiratory variability, suggesting right atrial pressure of 15 mmHg. FINDINGS  Left Ventricle: Left ventricular ejection fraction, by visual estimation, is 55 to 60%. The left ventricle has normal function. The left ventricle has no regional wall  motion abnormalities. There is no left ventricular hypertrophy. Left ventricular diastolic parameters are consistent with Grade I diastolic dysfunction (impaired relaxation). Indeterminate filling pressures. Right Ventricle: The right ventricular size is normal. No increase in right ventricular wall thickness. Global RV systolic function is has mildly reduced systolic function. The tricuspid regurgitant velocity is 2.65 m/s, and with an assumed right atrial pressure of 15 mmHg, the estimated right ventricular systolic pressure is moderately elevated at 43.1 mmHg. Left Atrium: Left atrial size was mildly dilated. Right Atrium: Right atrial size was normal in size Pericardium: Trivial pericardial effusion is present. Mitral Valve: The mitral valve is grossly normal. Trivial mitral valve regurgitation. Tricuspid Valve: The tricuspid valve is grossly normal. Tricuspid valve regurgitation is trivial. Aortic Valve: The aortic valve is tricuspid. Aortic valve regurgitation is not visualized. Pulmonic Valve: The pulmonic valve was grossly normal. Pulmonic valve regurgitation is trivial. Pulmonic regurgitation is trivial. Aorta: The aortic root and ascending aorta are structurally normal, with no evidence of dilitation. Venous: IVC assessment for right atrial pressure unable to be performed due to mechanical ventilation. The inferior vena cava is dilated in size with less than 50% respiratory variability, suggesting right atrial pressure of 15 mmHg. IAS/Shunts: No atrial level shunt detected by color flow Doppler.  LEFT VENTRICLE PLAX 2D LVIDd:         4.80 cm  Diastology LVIDs:         3.30 cm  LV e' lateral:   10.40 cm/s LV PW:         1.00 cm  LV E/e' lateral: 12.9 LV IVS:        1.00 cm  LV e' medial:    7.83 cm/s LVOT diam:     2.10 cm  LV E/e' medial:  17.1 LV SV:         63 ml LV SV Index:   27.96 LVOT Area:     3.46 cm  LEFT ATRIUM             Index  RIGHT ATRIUM           Index LA diam:        4.10 cm 1.87  cm/m  RA Area:     16.90 cm LA Vol (A2C):   44.2 ml 20.21 ml/m RA Volume:   45.60 ml  20.85 ml/m LA Vol (A4C):   79.0 ml 36.11 ml/m LA Biplane Vol: 62.3 ml 28.48 ml/m  AORTIC VALVE LVOT Vmax:   112.00 cm/s LVOT Vmean:  82.200 cm/s LVOT VTI:    0.215 m  AORTA Ao Root diam: 3.40 cm MITRAL VALVE                         TRICUSPID VALVE MV Area (PHT): 3.54 cm              TR Peak grad:   28.1 mmHg MV PHT:        62.06 msec            TR Vmax:        265.00 cm/s MV Decel Time: 214 msec MV E velocity: 134.00 cm/s 103 cm/s  SHUNTS MV A velocity: 115.00 cm/s 70.3 cm/s Systemic VTI:  0.22 m MV E/A ratio:  1.17        1.5       Systemic Diam: 2.10 cm  Lyman Bishop MD Electronically signed by Lyman Bishop MD Signature Date/Time: 08/08/2019/11:37:42 AM    Final      DG Chest 1 View  Result Date: 08/09/2019 CLINICAL DATA:  ETT placement EXAM: CHEST  1 VIEW COMPARISON:  Radiographs 08/09/2019 FINDINGS: Endotracheal tube in the mid trachea, 4.5 cm from the carina. Transesophageal tube tip beyond the GE junction curling in the left upper quadrant, terminating below the level of imaging. Diffuse interstitial pulmonary opacity throughout both lungs with few more patchy areas of opacity. Some right basilar pleural thickening compatible with fusion. Suspect at least trace left effusion as well. Cardiomediastinal contours are stable. No acute osseous or soft tissue abnormality. IMPRESSION: 1. Endotracheal tube 4.5 cm from the carina. 2. Transesophageal tube tip curls in the left upper quadrant, terminating below the level of imaging. 3. Diffuse interstitial airspace opacities compatible with infection and/or edema. Overall similar in extent to comparison studies. Electronically Signed   By: Lovena Le M.D.   On: 08/09/2019 14:40   DG Chest 2 View  Result Date: 08/02/2019 CLINICAL DATA:  Shortness of breath, recent discharge, CLL EXAM: CHEST - 2 VIEW COMPARISON:  Chest CT 04/30/2019, chest radiograph 08/01/2019  FINDINGS: Diffusely increased interstitial markings are again seen. No pneumothorax. More nodular masslike opacity seen in the left costophrenic sulcus possibly reflecting some prominent lymph nodes in the pericardial fat pad on comparison chest CT. Additional ovoid opacities are seen in the soft tissues of the axilla and base of the neck. Bilateral effusions are noted. Cardiomediastinal contours are similar to prior with nodular densities in the hila likely reflecting adenopathy. No acute osseous abnormality. Degenerative changes in the shoulders. IMPRESSION: Nonspecific increased interstitial markings with bilateral effusions. Could reflect atypical infection or edema. Nodular opacity in the left costophrenic sulcus may reflect adenopathy within the pericardial fat. Additional nodular opacities in the hila and axilla likely reflecting further adenopathy in the setting of CLL. Electronically Signed   By: Lovena Le M.D.   On: 08/02/2019 22:13   DG Chest 2 View  Result Date: 07/31/2019 CLINICAL DATA:  Shortness of breath. EXAM: CHEST - 2 VIEW COMPARISON:  April 27, 2017 FINDINGS: Cardiomediastinal silhouette is stable. No pneumothorax. No nodules or masses. Increased lung markings bilaterally with interstitial components. There is a small left pleural effusion. IMPRESSION: 1. Increase lung markings centrally with interstitial components suggestive of atypical infection/pneumonia. Bronchitis is a possibility. There is a small associated left effusion. Electronically Signed   By: Dorise Bullion III M.D   On: 07/31/2019 02:34   CT Angio Chest PE W/Cm &/Or Wo Cm  Result Date: 08/05/2019 CLINICAL DATA:  Shortness of breath, CLL EXAM: CT ANGIOGRAPHY CHEST WITH CONTRAST TECHNIQUE: Multidetector CT imaging of the chest was performed using the standard protocol during bolus administration of intravenous contrast. Multiplanar CT image reconstructions and MIPs were obtained to evaluate the vascular anatomy.  CONTRAST:  128m OMNIPAQUE IOHEXOL 350 MG/ML SOLN COMPARISON:  CT chest abdomen pelvis, 04/30/2011 FINDINGS: Cardiovascular: Satisfactory opacification of the pulmonary arteries to the segmental level. No evidence of pulmonary embolism. Normal heart size. No pericardial effusion. Mediastinum/Nodes: Numerous bulky bilateral axillary, lower cervical, mediastinal, and hilar lymph nodes, similar in size compared to CT dated 04/30/2019. Thyroid gland, trachea, and esophagus demonstrate no significant findings. Lungs/Pleura: Small bilateral pleural effusions. Diffuse bilateral interlobular septal thickening and scattered ground-glass opacities throughout the lungs. There are occasional small pulmonary nodules, which are new or enlarged when compared to prior CT. A nodule of the right pulmonary apex measures 7 mm, previously 3 mm when measured similarly (series 7, image 24). A new nodule of the right lower lobe measures 6 mm (series 7, image 84). Upper Abdomen: No acute abnormality. There is redemonstrated bulky celiac axis and gastrohepatic ligament lymphadenopathy, again similar to prior examination dated 04/30/2019 to the extent imaged. Musculoskeletal: No chest wall abnormality. No acute or significant osseous findings. Review of the MIP images confirms the above findings. IMPRESSION: 1.  Negative examination for pulmonary embolism. 2. Small bilateral pleural effusions with diffuse bilateral interlobular septal thickening and scattered ground-glass opacity throughout the lungs. These findings are consistent with infection or edema. 3. There are occasional small pulmonary nodules, which are new or enlarged when compared to prior CT. A nodule of the right pulmonary apex measures 7 mm, previously 3 mm when measured similarly (series 7, image 24). A new nodule of the right lower lobe measures 6 mm (series 7, image 84). These are most likely infectious or inflammatory; pulmonary nodules are an unlikely manifestation of  pulmonary involvement of lymphoma. 4. Numerous bulky bilateral axillary, lower cervical, mediastinal, and hilar lymph nodes, as well as bulky lymph nodes in the partially imaged upper abdomen, similar in size compared to CT dated 04/30/2019 and in keeping with diagnosis of CLL. Electronically Signed   By: AEddie CandleM.D.   On: 08/05/2019 15:26   CT ABDOMEN PELVIS W CONTRAST  Result Date: 08/06/2019 CLINICAL DATA:  CLL/SLL. EXAM: CT ABDOMEN AND PELVIS WITH CONTRAST TECHNIQUE: Multidetector CT imaging of the abdomen and pelvis was performed using the standard protocol following bolus administration of intravenous contrast. CONTRAST:  1016mOMNIPAQUE IOHEXOL 300 MG/ML  SOLN COMPARISON:  04/30/2019 FINDINGS: Lower chest: There are moderate to large bilateral pleural effusions that are only partially visualized on this exam.The heart size is borderline enlarged. There are few ground-glass airspace opacities at the lung bases with interlobular septal thickening suggestive of volume overload/developing pulmonary edema. Hepatobiliary: The liver is normal. There is layering hyperdense material within the gallbladder lumen. This may represent gallbladder sludge or vicarious excretion of contrast from the patient's prior contrast enhanced CT.There is no biliary ductal  dilation. Pancreas: Normal contours without ductal dilatation. No peripancreatic fluid collection. Spleen: The spleen is significantly enlarged Adrenals/Urinary Tract: --Adrenal glands: No adrenal hemorrhage. --Right kidney/ureter: No hydronephrosis or perinephric hematoma. --Left kidney/ureter: No hydronephrosis or perinephric hematoma. --Urinary bladder: Unremarkable. Stomach/Bowel: --Stomach/Duodenum: No hiatal hernia or other gastric abnormality. Normal duodenal course and caliber. --Small bowel: No dilatation or inflammation. --Colon: No focal abnormality. --Appendix: Normal. Vascular/Lymphatic: Atherosclerotic calcification is present within the  non-aneurysmal abdominal aorta, without hemodynamically significant stenosis. --there is extensive retroperitoneal adenopathy --there is extensive mesenteric adenopathy --there is extensive pelvic and inguinal adenopathy. Overall, the adenopathy appears to be relatively stable to slightly improved when compared to prior study. Reproductive: Unremarkable Other: No ascites or free air. The abdominal wall is normal. Musculoskeletal. 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 There is no acute displaced fracture. IMPRESSION: 1. Moderate to large bilateral pleural effusions with adjacent atelectasis. 2. Extensive adenopathy throughout the abdomen and pelvis, similar to slightly improved when compared to September 2020 CT. 3. Splenomegaly. 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 Aortic Atherosclerosis (ICD10-I70.0). Electronically Signed   By: Constance Holster M.D.   On: 08/06/2019 22:34   DG Chest Port 1 View  Result Date: 08/09/2019 CLINICAL DATA:  Endotracheal tube EXAM: PORTABLE CHEST 1 VIEW COMPARISON:  08/08/2019 FINDINGS: No significant interval change in AP portable examination with endotracheal tube and esophagogastric tube in position. There remains mild, diffuse interstitial pulmonary opacity throughout, consistent with mild edema or infection. No new or focal airspace opacity. The heart and mediastinum are unremarkable. IMPRESSION: No significant interval change in diffuse interstitial opacity throughout both lungs, consistent with mild edema or infection. Electronically Signed   By: Eddie Candle M.D.   On: 08/09/2019 08:07   DG CHEST PORT 1 VIEW  Result Date: 08/08/2019 CLINICAL DATA:  48 year old male status post cardiac arrest yesterday. History of CLL and suspected recent pneumonia. Negative for COVID-19. EXAM: PORTABLE CHEST 1 VIEW COMPARISON:  08/07/2019 and earlier. FINDINGS: Portable AP semi upright view at 0531 hours. Endotracheal  tube tip in good position between the level the clavicles and carina. Enteric tube courses to the abdomen, tip not included. Course and asymmetric bilateral pulmonary interstitial opacity with regressed more confluent perihilar opacity since yesterday. The ventilation now resembles that on 08/05/2019. There is no longer confluent opacity at the left lateral lung base. No pneumothorax or pleural effusion. Paucity of bowel gas. No acute osseous abnormality identified. IMPRESSION: 1. Satisfactory lines and tubes. 2. Improved ventilation since yesterday with regressed perihilar opacity. Continued coarse interstitial opacity resembling that on 08/05/2019 which was also demonstrated by CTA at that time. 3. No new cardiopulmonary abnormality. Electronically Signed   By: Genevie Ann M.D.   On: 08/08/2019 10:48   DG CHEST PORT 1 VIEW  Result Date: 08/07/2019 CLINICAL DATA:  Tube placement, code EXAM: PORTABLE CHEST 1 VIEW COMPARISON:  06/05/2019 FINDINGS: Interval placement of endotracheal tube, tip projecting over the mid trachea. Esophagogastric tube, poorly visualized due to underpenetration although appears to be with tip and side port below the diaphragm. There is mild, diffuse interstitial pulmonary opacity. Layering pleural effusions seen on prior CT are not well appreciated. No new airspace opacity. IMPRESSION: 1. Interval placement of endotracheal tube, tip projecting over the mid trachea. 2. Esophagogastric tube, poorly visualized due to underpenetration although appears to be with tip and side port below the diaphragm. 3. There is mild, diffuse interstitial pulmonary opacity. Layering pleural effusions seen on prior  CT are not well appreciated. No new airspace opacity. Electronically Signed   By: Eddie Candle M.D.   On: 08/07/2019 14:10   DG Chest Port 1 View  Result Date: 08/05/2019 CLINICAL DATA:  PT c/o worsening SOB since last night. Pt states he is normally somewhat SOB at baseline. Hx HTN, diabetes,  COPD, asthma, former smoker EXAM: PORTABLE CHEST - 1 VIEW COMPARISON:  08/02/2019 FINDINGS: Increasing patchy airspace infiltrate laterally at the left lung base. Diffuse interstitial markings throughout both lungs as before. Heart size normal. Right paratracheal soft tissue consistent with adenopathy. The aortopulmonary window is obscured suggesting adenopathy or mass. Blunting of left lateral costophrenic angle. No pneumothorax. Visualized bones unremarkable. IMPRESSION: 1. Increasing patchy airspace infiltrate laterally at the left lung base suspicious for pneumonia. 2. Probable mediastinal adenopathy. 3. Diffuse interstitial markings throughout both lungs as before. Electronically Signed   By: Lucrezia Europe M.D.   On: 08/05/2019 09:12   DG CHEST PORT 1 VIEW  Result Date: 08/01/2019 CLINICAL DATA:  48 year old male with weakness. Acute on chronic anemia, thrombocytopenia. Chronic lymphocytic leukemia. Negative for COVID-19. Yesterday. EXAM: PORTABLE CHEST 1 VIEW COMPARISON:  Chest radiographs 07/31/2019 and earlier. FINDINGS: Portable AP upright view at 1518 hours. Stable lung volumes and mediastinal contours. Mediastinal lymphadenopathy demonstrated by CT in September likely persists. Stable mild bilateral nonspecific pulmonary interstitial markings, slightly greater on the right. No superimposed pneumothorax, pleural effusion or consolidation. Visualized tracheal air column is within normal limits. Negative visible bowel gas pattern. No acute osseous abnormality identified. IMPRESSION: 1. Persistent nonspecific pulmonary interstitial markings greater on the right. Doubt pulmonary edema. Atypical infection remains possible. But this might be related to the patient's CLL given suggestion of some nonspecific interstitial opacity by CT in September. 2. Evidence of mediastinal lymphadenopathy stable since September. 3. No new cardiopulmonary abnormality since yesterday. Electronically Signed   By: Genevie Ann M.D.    On: 08/01/2019 16:59   DG Abd Portable 1V  Result Date: 08/09/2019 CLINICAL DATA:  OG tube placement EXAM: PORTABLE ABDOMEN - 1 VIEW COMPARISON:  Concurrent chest radiograph, CT abdomen pelvis 08/06/2019 FINDINGS: Tip and side port of the transesophageal tube or distal to the GE junction, terminating in the region of the gastric antrum/duodenal bulb. Bowel gas pattern is nonobstructive. No acute soft tissue abnormality is seen. Osseous structures are unremarkable. IMPRESSION: 1. Tip of the transesophageal tube in the region of the gastric antrum/duodenal bulb. 2. Nonobstructive bowel gas pattern. Electronically Signed   By: Lovena Le M.D.   On: 08/09/2019 14:41   ECHOCARDIOGRAM COMPLETE  Result Date: 08/08/2019   ECHOCARDIOGRAM REPORT   Patient Name:   Larry Lam Date of Exam: 08/08/2019 Medical Rec #:  [id number]            Height:       70.0 in Accession #:    [id number]           Weight:       223.3 lb Date of Birth:  01/13/1971            BSA:          2.19 m Patient Age:    65 years             BP:           119/61 mmHg Patient Gender: M                    HR:  68 bpm. Exam Location:  Inpatient Procedure: 2D Echo Indications:    Cardiac arrest I46.9  History:        Patient has prior history of Echocardiogram examinations, most                 recent 04/28/2017. COPD, Signs/Symptoms:Hypotension; Risk                 Factors:Diabetes. Acute on chronic respiratory failure.  Sonographer:    Vikki Ports Turrentine Referring Phys: [id number] Francesca Jewett  Sonographer Comments: Technically difficult study due to poor echo windows and echo performed with patient supine and on artificial respirator. Image acquisition challenging due to patient body habitus. IMPRESSIONS  1. Left ventricular ejection fraction, by visual estimation, is 55 to 60%. The left ventricle has normal function. There is no left ventricular hypertrophy.  2. Left ventricular diastolic parameters are consistent with Grade I  diastolic dysfunction (impaired relaxation).  3. The left ventricle has no regional wall motion abnormalities.  4. Global right ventricle has mildly reduced systolic function.The right ventricular size is normal. No increase in right ventricular wall thickness.  5. Left atrial size was mildly dilated.  6. Right atrial size was normal.  7. Trivial pericardial effusion is present.  8. The mitral valve is grossly normal. Trivial mitral valve regurgitation.  9. The tricuspid valve is grossly normal. Tricuspid valve regurgitation is trivial. 10. The aortic valve is tricuspid. Aortic valve regurgitation is not visualized. 11. The pulmonic valve was grossly normal. Pulmonic valve regurgitation is trivial. 12. Moderately elevated pulmonary artery systolic pressure. 13. The inferior vena cava is dilated in size with <50% respiratory variability, suggesting right atrial pressure of 15 mmHg. FINDINGS  Left Ventricle: Left ventricular ejection fraction, by visual estimation, is 55 to 60%. The left ventricle has normal function. The left ventricle has no regional wall motion abnormalities. There is no left ventricular hypertrophy. Left ventricular diastolic parameters are consistent with Grade I diastolic dysfunction (impaired relaxation). Indeterminate filling pressures. Right Ventricle: The right ventricular size is normal. No increase in right ventricular wall thickness. Global RV systolic function is has mildly reduced systolic function. The tricuspid regurgitant velocity is 2.65 m/s, and with an assumed right atrial pressure of 15 mmHg, the estimated right ventricular systolic pressure is moderately elevated at 43.1 mmHg. Left Atrium: Left atrial size was mildly dilated. Right Atrium: Right atrial size was normal in size Pericardium: Trivial pericardial effusion is present. Mitral Valve: The mitral valve is grossly normal. Trivial mitral valve regurgitation. Tricuspid Valve: The tricuspid valve is grossly normal. Tricuspid  valve regurgitation is trivial. Aortic Valve: The aortic valve is tricuspid. Aortic valve regurgitation is not visualized. Pulmonic Valve: The pulmonic valve was grossly normal. Pulmonic valve regurgitation is trivial. Pulmonic regurgitation is trivial. Aorta: The aortic root and ascending aorta are structurally normal, with no evidence of dilitation. Venous: IVC assessment for right atrial pressure unable to be performed due to mechanical ventilation. The inferior vena cava is dilated in size with less than 50% respiratory variability, suggesting right atrial pressure of 15 mmHg. IAS/Shunts: No atrial level shunt detected by color flow Doppler.  LEFT VENTRICLE PLAX 2D LVIDd:         4.80 cm  Diastology LVIDs:         3.30 cm  LV e' lateral:   10.40 cm/s LV PW:         1.00 cm  LV E/e' lateral: 12.9 LV IVS:  1.00 cm  LV e' medial:    7.83 cm/s LVOT diam:     2.10 cm  LV E/e' medial:  17.1 LV SV:         63 ml LV SV Index:   27.96 LVOT Area:     3.46 cm  LEFT ATRIUM             Index       RIGHT ATRIUM           Index LA diam:        4.10 cm 1.87 cm/m  RA Area:     16.90 cm LA Vol (A2C):   44.2 ml 20.21 ml/m RA Volume:   45.60 ml  20.85 ml/m LA Vol (A4C):   79.0 ml 36.11 ml/m LA Biplane Vol: 62.3 ml 28.48 ml/m  AORTIC VALVE LVOT Vmax:   112.00 cm/s LVOT Vmean:  82.200 cm/s LVOT VTI:    0.215 m  AORTA Ao Root diam: 3.40 cm MITRAL VALVE                         TRICUSPID VALVE MV Area (PHT): 3.54 cm              TR Peak grad:   28.1 mmHg MV PHT:        62.06 msec            TR Vmax:        265.00 cm/s MV Decel Time: 214 msec MV E velocity: 134.00 cm/s 103 cm/s  SHUNTS MV A velocity: 115.00 cm/s 70.3 cm/s Systemic VTI:  0.22 m MV E/A ratio:  1.17        1.5       Systemic Diam: 2.10 cm  Lyman Bishop MD Electronically signed by Lyman Bishop MD Signature Date/Time: 08/08/2019/11:37:42 AM    Final     Pathology:  FLOW CYTOMETRY 08/03/2019  PATH INTERP XXX-IMP Comment   Comment: (NOTE)  Chronic  lymphocytic leukemia, B cell, CD38 expression indeterminate  for  prognosis (21%). (See comment.)   ANNOTATION COMMENT IMP Comment VC   Comment: (NOTE)  Expression of CD38 between 20-30% is reported to be indeterminate for  prognosis in CLL. Recommend correlation with the clinical findings  and CLL  FISH, if available, and follow up as appropriate.   CLINICAL INFO Comment VC   Comment: (NOTE)  Chronic Lymphocytic Leukemia  Accompanying CBC dated 08-03-19 shows: WBC count 17.0, Lym% 96, Lym  16.2.   Specimen Type Comment   Comment: Peripheral blood  ASSESSMENT OF LEUKOCYTES Comment   Comment: (NOTE)  A CD5+, CD23+, partial CD38+ (21%) clonal B cell population is  detected  with dim lambda light chain restriction, representing >99% of the B  cells  and 90% of the leukocytes. The phenotype is typical of chronic  lymphocytic  leukemia (CLL) /small lymphocytic lymphoma (SLL).  There is no loss of, or aberrant expression of, the pan T cell  antigens to  suggest a neoplastic T cell process.  CD4:CD8 ratio 1.2  No circulating blasts are detected.  There is no immunophenotypic evidence of abnormal myeloid maturation.  Analysis of the leukocyte population shows: granulocytes 4%,  monocytes <1%,  lymphocytes 96%, blasts <0.5%, B cells 90%, T cells 5%, NK cells 1%.   % Viable Cells Comment VC   Comment: 97%  Immunophenotypic Profile 90% of total cells (Phenotype below) VC   Comment: Comment  Abnormal cell population: present  ANALYSIS AND GATING STRATEGY Comment   Comment: 8 color analysis with CD45/SSC  IMMUNOPHENOTYPING STUDY Comment   Comment: (NOTE)  CD2    (-)      CD3    (-)  CD4    (-)      CD5    (+)  CD7    (-)      CD8    (-)  CD10   (-)      CD11b   (-)  CD11c   (+) Dim    CD13   (-)  CD14   (-)      CD15   (-)  CD16   (-)      CD19   (+)  CD20   (+) Dim    CD22   (+) Dim  CD23    (+) Bright   CD33   (-)  CD34   (-)      CD38   See Text  CD45   (+)      CD56   (-)  CD57   (-)      CD103   (-)  CD117   (-)      FMC-7   (-)  HLA-DR  (+)      KAPPA   (-)  LAMBDA  (+) Dim    CD64   (-)      Assessment and Plan:  1. Progressive CLL -The patient has a history of lymphadenopathy since 2010. -He was diagnosed in August 2020 by flow cytometry, CD5 positive, CD23 positive lymphocytes consistent with CLL -CLL FISH panel showed ATM mutation.  T p53 by FISH was negative.  I GVH mutation positive. -He developed worsening anemia and thrombocytopenia along with constitutional symptoms including anorexia, weight loss, and bulky adenopathy. -Repeat flow cytometry on 08/03/2019 was again consistent with CLL, CD5 positive, CD23 positive. -CT angiogram of the chest on 08/05/2019 and CT of the abdomen pelvis with contrast on 08/06/2019 show continued widespread bulky adenopathy. -PET scan and bone marrow biopsy have been ordered, but not yet completed. -The patient was under consideration to begin treatment with venetoclax and obinutuzumab pending completion of work-up. -Recommend proceeding with bone marrow biopsy in interventional radiology when the patient is medically stable unable to proceed. -Further recommendations pending completion of work-up.  2. Acute respiratory failure -abnormal CT of the chest?pneumonia vs. CLL involvement -Attempted to extubate earlier today, but the patient developed tachycardia, tachypnea, and increased work of breathing requiring reintubation -Ongoing management per PCCM.  3.  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 -Vitamin B12 level was low at 168 on 08/06/2019. -Recommend vitamin B12 injections daily for 7 days, then weekly for 4 weeks, and then monthly -Recommend bone marrow biopsy as above. -Transfuse per ICU  parameters.  4.  Thrombocytopenia -Suspect this is due to bone marrow infiltration from CLL versus severe B12 deficiency. -Platelet count is 26,000 which appears to be stable. -There is no active bleeding. -Recommend platelet transfusion for platelet count less than 20,000 or active bleeding.  Thank you for this referral.   Mikey Bussing, DNP, AGPCNP-BC, AOCNP  ADDENDUM: Hematology/Oncology Attending: I had a face-to-face encounter with the patient today.  I recommended his care plan and agree with the above note.  This is a very pleasant 48 years old white male with history of CLL/small lymphocytic lymphoma diagnosed in 2010 and has been in observation for several years with no treatment.  Further evaluation in  August 2020 confirmed the diagnosis of CLL with mutated IGVH.  The cytogenetics were negative for P 17.  The patient was admitted to the hospital recently with significant fatigue and weakness as well as shortness of breath and his oxygen saturation was down to 40%.  He has been on chronic oxygen secondary to COPD.  The patient was admitted to the hospital and imaging studies showed questionable left lower lobe pneumonia in addition to bilateral pleural effusion.  He was started on antibiotic regimen with vancomycin and cefepime.  Imaging studies including CT angiogram of the chest as well as CT of the abdomen and pelvis showed significant lymphadenopathy in the axilla bilaterally, mediastinum as well as abdominal and inguinal lymphadenopathy.  The patient is currently intubated but he was able to communicate during the visit.  His wife was at the bedside.  His lab work showed significant cytopenia including severe anemia in addition to thrombocytopenia. I had a lengthy discussion with the patient and his wife today about his condition and further work-up as well as treatment options.  I recommended for the patient to have a bone marrow biopsy and aspirate to rule out infiltration of the bone  marrow with his leukemia.  We will arrange for the patient to have a PET scan on outpatient basis. His respiratory failure is likely secondary to pneumonia as well as pulmonary edema and COPD exacerbation but the mediastinal lymphadenopathy could be an additional contribution to his condition. I will arrange for the patient to start obinutuzumab for few doses during his hospitalization until stabilization of his condition and then we may consider the patient for treatment on outpatient basis with additional treatment with acalabrutinib or venetoclax.. For the anemia and thrombocytopenia, this could be also immune mediated.  He may benefit from treatment with high-dose steroids with prednisone 1 mg/KG or equivalent daily to be tapered slowly over the next few weeks. I will arrange for the patient a follow-up appointment on outpatient basis for more detailed discussion of his treatment options as the patient and his wife requested to transfer his care to [facility]. I gave the patient and his wife at the time to ask questions and I answered them completely to their satisfaction. Thank you for allowing me to participate in the care of Mr. Fjelstad.  I will continue to follow up the patient with you and assist in his management on as-needed basis.  Disclaimer: This note was dictated with voice recognition software. Similar sounding words can inadvertently be transcribed and may be missed upon review. Eilleen Kempf, MD

## 2019-08-09 NOTE — Progress Notes (Signed)
NAME:  Larry Lam, MRN:  [id number], DOB:  1971/08/19, LOS: 4 ADMISSION DATE:  08/05/2019, CONSULTATION DATE: 08/07/2019 REFERRING MD: TRH, CHIEF COMPLAINT: Status post arrest  Brief History   48 y.o.malewith medical history significant forrecent diagnosis of CLL/CML, acute on chronic respiratory failure on 2 L O2, COPD, seizures, diabetes mellitus, hypertension.  Admitted to [facility] on 12/10 with acute on chronic respiratory failure, pneumonia Had respiratory/cardiac arrest on 12/12 after he removed his nasal cannula.  Intubated and transferred to [facility]  Past Medical History  Asthma, COPD, diabetes, hypertension, CHF, CLL/CML, seizures, HTN  Significant Hospital Events   12/10 Admitted to [facility]  12/12 PEA arrest secondary to hypoxemia, transfer to [facility]  Consults:  PCCM  Procedures:  ETT 12/12 >  Femoral CVL 12/12 >  Significant Diagnostic Tests:  CTA 12/10-no pulmonary embolism, small effusion with scattered groundglass opacities.  Subcentimeter pulmonary nodule, bulky axillary, cervical, mediastinal and hilar lymph nodes.  CT abdomen 12/11-extensive adenopathy, splenomegaly, avascular necrosis of hips.  Micro Data:  12/10 Sars-CoV-2>>neg 12/10 Influenza A/B>>neg 12/13 tracheal aspirate  Antimicrobials:  Azithromycin 12/10 >> Cefepime 12/10 >>  Interim history/subjective:  Awake alert on vent and PS. Denies pain.  Objective   Blood pressure (!) 112/52, pulse 63, temperature 99.3 F (37.4 C), temperature source Oral, resp. rate 20, height 5\' 10"  (1.778 m), weight 101.3 kg, SpO2 96 %. CVP:  [0 mmHg-11 mmHg] 8 mmHg  Vent Mode: PRVC FiO2 (%):  [40 %] 40 % Set Rate:  [20 bmp] 20 bmp Vt Set:  [580 mL] 580 mL PEEP:  [5 cmH20] 5 cmH20 Plateau Pressure:  [21 cmH20-26 cmH20] 21 cmH20   Intake/Output Summary (Last 24 hours) at 08/09/2019 0803 Last data filed at 08/09/2019 0700 Gross per 24 hour  Intake 1605.42 ml  Output 1150 ml  Net  455.42 ml   Filed Weights   08/05/19 0837 08/07/19 0400  Weight: 103 kg 101.3 kg    Examination: GEN: middle aged man in NAD HEENT: ETT in place with minimal secretions CV: RRR, ext warm PULM: Clear, no wheezing, no accessory muscle use GI:  Soft, +BS EXT: No edema NEURO: Moves all 4 ext to command PSYCH: RASS 0  SKIN: No rashes   Resolved Hospital Problem list   NA  Assessment & Plan:  # PEA arrest- related to hypoxemia,  # COPD- not in flare, on home O2 # Abnormal CT chest- question of pneumonia vs. Aspiration vs. CLL involvement, Pct neg.  He had similar GGO on top of emphysema on his CT in September, wonder if all related to CLL with superimposed inflammatory effect vs. edema. # Acute respiratory failure- improving, SBT this AM.  Underlying chronic hypercapnea and hypoxemia # AKI, improving, keep neutral # CLL- with anemia and thrombocytopenia # Previous diagnosis of sarcoidosis?  - Wean to extubate - Swallow screen - Stop antibiotics, monitor fever curve and symptoms - May do diuretic trial depending on renal function tomorrow - Apparently steroids make him extremely angry, will try to hold off - Need to understand his oncology history better, is he on small molecule inhibitors?  Best practice:  Diet: Swallow screen Pain/Anxiety/Delirium protocol (if indicated): Propofol-Wean sedation VAP protocol (if indicated): Yes DVT prophylaxis: SCDs.  Holding heparin due to thrombocytopenia GI prophylaxis: PPI Glucose control: Monitor, on lower end Mobility: Bedrest Code Status: Full Family Communication: updated patient at time of evaluation Disposition:ICU  The patient is critically ill with multiple organ systems failure and requires  high complexity decision making for assessment and support, frequent evaluation and titration of therapies, application of advanced monitoring technologies and extensive interpretation of multiple databases. Critical Care Time devoted to  patient care services described in this note independent of APP/resident time (if applicable)  is 31 minutes.   Erskine Emery MD [facility] 08/09/2019 8:44 AM Personal pager: [phone] If unanswered, please page CCM On-call: [phone]

## 2019-08-09 NOTE — Procedures (Signed)
Intubation Procedure Note Larry Lam [id number] June 18, 1971  Procedure: Intubation Indications: Respiratory insufficiency  Procedure Details Consent: Risks of procedure as well as the alternatives and risks of each were explained to the (patient/caregiver).  Consent for procedure obtained. Time Out: Verified patient identification, verified procedure, site/side was marked, verified correct patient position, special equipment/implants available, medications/allergies/relevent history reviewed, required imaging and test results available.  Performed  Premedicated with fentanyl 158mg, versed 260m etiomidate 2037mnd rocuronium 50 mg.    Maximum sterile technique was used including antiseptics, cap, gloves, hand hygiene and mask.  MAC and 4 Grade 1 cormack view.  ETT 7.5 placed to 24 cm at lip and secured.  1 attempt.  Positive end tidal capnography.  Connected to MV.    Evaluation Hemodynamic Status: BP stable throughout; O2 sats: stable throughout Patient's Current Condition: stable Complications: No apparent complications Patient did tolerate procedure well. Chest X-ray ordered to verify placement.  CXR: pending.    BroKennieth RadSN, AGACNP-BC [facility] 08/09/2019, 1:11 PM

## 2019-08-09 NOTE — Procedures (Signed)
Bronchoscopy  Indication: Abnormal CT  Consent: Obtained from wife after discussing risk benefit  Anesthesia: In place from intubation  Procedure - Timeout performed - Bronchoscope advanced through ETT - Airways examined down to subsegmental level - Following airway examination, BAL in RLL with bloody return  Findings - Small amount thin bloody secretions in airway - Mild-moderate chronic bronchitis changes  Specimen(s): RLL BAL  Complications: None immediate

## 2019-08-09 NOTE — Procedures (Signed)
Extubation Procedure Note  Patient Details:   Name: Larry Lam DOB: May 01, 1971 MRN: [id number]   Airway Documentation:    Vent end date: 08/09/19 Vent end time: 1158   Evaluation  O2 sats: stable throughout Complications: Complications of desaturation into the 60's folowing extubation. Patient was placed on NRB with improved SAT's in the 80's. CCM was called by RN and orders were given to place patient on BIPAP. Patient did not tolerate procedure well. Bilateral Breath Sounds: Clear, Diminished   Yes   Patient met all parameters for extubation including a positive cuff leak.   [name], Eddie North 08/09/2019, 11:58

## 2019-08-09 NOTE — Progress Notes (Signed)
Dr Ina Homes called regarding patient having increased work of breathing regardless of BIPAP. Patient's heart rate in the 140's, MAP >100, RR >30. Patient alert and oriented x 4 at moment but eyes are beginning to roll and patient is having a hard time tracking RN. MD preparing to reintubate patient. RN made RT aware.

## 2019-08-10 ENCOUNTER — Inpatient Hospital Stay (HOSPITAL_COMMUNITY): Payer: Medicare Other

## 2019-08-10 DIAGNOSIS — I950 Idiopathic hypotension: Secondary | ICD-10-CM

## 2019-08-10 LAB — RENAL FUNCTION PANEL
Albumin: 2.7 g/dL — ABNORMAL LOW (ref 3.5–5.0)
Anion gap: 13 (ref 5–15)
BUN: 65 mg/dL — ABNORMAL HIGH (ref 6–20)
CO2: 29 mmol/L (ref 22–32)
Calcium: 8.5 mg/dL — ABNORMAL LOW (ref 8.9–10.3)
Chloride: 98 mmol/L (ref 98–111)
Creatinine, Ser: 1.37 mg/dL — ABNORMAL HIGH (ref 0.61–1.24)
GFR calc Af Amer: >60 mL/min (ref >60)
GFR calc non Af Amer: >60 mL/min (ref >60)
Glucose, Bld: 246 mg/dL — ABNORMAL HIGH (ref 70–99)
Phosphorus: 4.9 mg/dL — ABNORMAL HIGH (ref 2.5–4.6)
Potassium: 4.8 mmol/L (ref 3.5–5.1)
Sodium: 140 mmol/L (ref 135–145)

## 2019-08-10 LAB — GLUCOSE, CAPILLARY
Glucose-Capillary: 216 mg/dL — ABNORMAL HIGH (ref 70–99)
Glucose-Capillary: 203 mg/dL — ABNORMAL HIGH (ref 70–99)
Glucose-Capillary: 261 mg/dL — ABNORMAL HIGH (ref 70–99)
Glucose-Capillary: 236 mg/dL — ABNORMAL HIGH (ref 70–99)
Glucose-Capillary: 218 mg/dL — ABNORMAL HIGH (ref 70–99)
Glucose-Capillary: 235 mg/dL — ABNORMAL HIGH (ref 70–99)

## 2019-08-10 LAB — CBC WITH DIFFERENTIAL/PLATELET
Abs Immature Granulocytes: 0.09 10*3/uL — ABNORMAL HIGH (ref 0.00–0.07)
Basophils Absolute: 0.1 10*3/uL (ref 0.0–0.1)
Basophils Relative: 0 %
Eosinophils Absolute: 0 10*3/uL (ref 0.0–0.5)
Eosinophils Relative: 0 %
HCT: 25.8 % — ABNORMAL LOW (ref 39.0–52.0)
Hemoglobin: 8.3 g/dL — ABNORMAL LOW (ref 13.0–17.0)
Immature Granulocytes: 0 %
Lymphocytes Relative: 97 %
Lymphs Abs: 43.5 10*3/uL — ABNORMAL HIGH (ref 0.7–4.0)
MCH: 29.9 pg (ref 26.0–34.0)
MCHC: 32.2 g/dL (ref 30.0–36.0)
MCV: 92.8 fL (ref 80.0–100.0)
Monocytes Absolute: 0.2 10*3/uL (ref 0.1–1.0)
Monocytes Relative: 0 %
Neutro Abs: 1.5 10*3/uL — ABNORMAL LOW (ref 1.7–7.7)
Neutrophils Relative %: 3 %
Platelets: 27 10*3/uL — CL (ref 150–400)
RBC: 2.78 MIL/uL — ABNORMAL LOW (ref 4.22–5.81)
RDW: 16.7 % — ABNORMAL HIGH (ref 11.5–15.5)
WBC: 45.4 10*3/uL — ABNORMAL HIGH (ref 4.0–10.5)
nRBC: 0.1 % (ref 0.0–0.2)

## 2019-08-10 LAB — POCT I-STAT 7, (LYTES, BLD GAS, ICA,H+H)
Acid-Base Excess: 7 mmol/L — ABNORMAL HIGH (ref 0.0–2.0)
Bicarbonate: 31.1 mmol/L — ABNORMAL HIGH (ref 20.0–28.0)
Calcium, Ion: 1.13 mmol/L — ABNORMAL LOW (ref 1.15–1.40)
HCT: 35 % — ABNORMAL LOW (ref 39.0–52.0)
Hemoglobin: 11.9 g/dL — ABNORMAL LOW (ref 13.0–17.0)
O2 Saturation: 99 %
Patient temperature: 98
Potassium: 4.7 mmol/L (ref 3.5–5.1)
Sodium: 139 mmol/L (ref 135–145)
TCO2: 32 mmol/L (ref 22–32)
pCO2 arterial: 42 mmHg (ref 32.0–48.0)
pH, Arterial: 7.476 — ABNORMAL HIGH (ref 7.350–7.450)
pO2, Arterial: 133 mmHg — ABNORMAL HIGH (ref 83.0–108.0)

## 2019-08-10 LAB — URIC ACID: Uric Acid, Serum: 8.3 mg/dL (ref 3.7–8.6)

## 2019-08-10 LAB — TRIGLYCERIDES: Triglycerides: 268 mg/dL — ABNORMAL HIGH (ref <150)

## 2019-08-10 MED ORDER — VITAL HIGH PROTEIN PO LIQD: 1000.0000 mL | ORAL | Status: DC

## 2019-08-10 MED ORDER — SODIUM CHLORIDE 0.9 % IV SOLN
20.0000 mg | Freq: Once | INTRAVENOUS | Status: AC
  Administered 2019-08-10: 20 mg via INTRAVENOUS
  Filled 2019-08-10: qty 2

## 2019-08-10 MED ORDER — DIPHENHYDRAMINE HCL 50 MG/ML IJ SOLN
50.0000 mg | Freq: Once | INTRAMUSCULAR | Status: AC
  Administered 2019-08-11: 50 mg via INTRAVENOUS
  Filled 2019-08-10: qty 1

## 2019-08-10 MED ORDER — INSULIN ASPART 100 UNIT/ML ~~LOC~~ SOLN
0.0000 [IU] | SUBCUTANEOUS | Status: DC
  Administered 2019-08-10 – 2019-08-11 (×5): 5 [IU] via SUBCUTANEOUS

## 2019-08-10 MED ORDER — SODIUM CHLORIDE 0.9 % IV SOLN: Freq: Once | INTRAVENOUS | Status: DC

## 2019-08-10 MED ORDER — DEXAMETHASONE SODIUM PHOSPHATE 4 MG/ML IJ SOLN: 4.0000 mg | Freq: Two times a day (BID) | INTRAMUSCULAR | Status: DC

## 2019-08-10 MED ORDER — DIPHENHYDRAMINE HCL 50 MG/ML IJ SOLN: 50.0000 mg | Freq: Once | INTRAMUSCULAR | Status: DC | PRN

## 2019-08-10 MED ORDER — SODIUM CHLORIDE 0.9 % IV SOLN: Freq: Once | INTRAVENOUS | Status: DC | PRN

## 2019-08-10 MED ORDER — INSULIN GLARGINE 100 UNIT/ML ~~LOC~~ SOLN
10.0000 [IU] | Freq: Every day | SUBCUTANEOUS | Status: DC
  Administered 2019-08-10: 10 [IU] via SUBCUTANEOUS
  Filled 2019-08-10: qty 0.1

## 2019-08-10 MED ORDER — EPINEPHRINE 1 MG/10ML IJ SOSY: 0.3000 mg | PREFILLED_SYRINGE | Freq: Once | INTRAMUSCULAR | Status: DC | PRN

## 2019-08-10 MED ORDER — ALLOPURINOL 100 MG PO TABS
100.0000 mg | ORAL_TABLET | Freq: Two times a day (BID) | ORAL | Status: DC
  Administered 2019-08-10 (×2): 100 mg via ORAL
  Filled 2019-08-10 (×2): qty 1

## 2019-08-10 MED ORDER — ALBUTEROL SULFATE (2.5 MG/3ML) 0.083% IN NEBU: 2.5000 mg | INHALATION_SOLUTION | Freq: Once | RESPIRATORY_TRACT | Status: DC | PRN

## 2019-08-10 MED ORDER — EPINEPHRINE PF 1 MG/ML IJ SOLN: 0.3000 mg | Freq: Once | INTRAMUSCULAR | Status: DC | PRN

## 2019-08-10 MED ORDER — CYANOCOBALAMIN 1000 MCG/ML IJ SOLN
1000.0000 ug | Freq: Every day | INTRAMUSCULAR | Status: DC
  Administered 2019-08-10 – 2019-08-13 (×4): 1000 ug via SUBCUTANEOUS
  Filled 2019-08-10 (×4): qty 1

## 2019-08-10 MED ORDER — ACETAMINOPHEN 325 MG PO TABS
650.0000 mg | ORAL_TABLET | Freq: Once | ORAL | Status: AC
  Administered 2019-08-11: 650 mg via ORAL
  Filled 2019-08-10: qty 2

## 2019-08-10 MED ORDER — METOLAZONE 5 MG PO TABS
10.0000 mg | ORAL_TABLET | Freq: Once | ORAL | Status: AC
  Administered 2019-08-10: 11:00:00 10 mg via ORAL
  Filled 2019-08-10: qty 2

## 2019-08-10 MED ORDER — SODIUM CHLORIDE 0.9 % IV SOLN
100.0000 mg | Freq: Once | INTRAVENOUS | Status: AC
  Administered 2019-08-10: 100 mg via INTRAVENOUS
  Filled 2019-08-10: qty 4

## 2019-08-10 MED ORDER — FUROSEMIDE 10 MG/ML IJ SOLN
40.0000 mg | Freq: Four times a day (QID) | INTRAMUSCULAR | Status: AC
  Administered 2019-08-10 (×3): 40 mg via INTRAVENOUS
  Filled 2019-08-10 (×3): qty 4

## 2019-08-10 MED ORDER — EPINEPHRINE 0.3 MG/0.3ML IJ SOAJ
0.3000 mg | Freq: Once | INTRAMUSCULAR | Status: DC | PRN
  Filled 2019-08-10: qty 0.6

## 2019-08-10 MED ORDER — METHYLPREDNISOLONE SODIUM SUCC 125 MG IJ SOLR: 125.0000 mg | Freq: Once | INTRAMUSCULAR | Status: DC | PRN

## 2019-08-10 MED ORDER — FAMOTIDINE IN NACL 20-0.9 MG/50ML-% IV SOLN: 20.0000 mg | Freq: Once | INTRAVENOUS | Status: DC | PRN

## 2019-08-10 MED ORDER — ACETAMINOPHEN 325 MG PO TABS
650.0000 mg | ORAL_TABLET | Freq: Once | ORAL | Status: AC
  Administered 2019-08-10: 650 mg via ORAL
  Filled 2019-08-10: qty 2

## 2019-08-10 MED ORDER — DIPHENHYDRAMINE HCL 50 MG/ML IJ SOLN
50.0000 mg | Freq: Once | INTRAMUSCULAR | Status: AC
  Administered 2019-08-10: 50 mg via INTRAVENOUS
  Filled 2019-08-10: qty 1

## 2019-08-10 MED ORDER — SODIUM CHLORIDE 0.9 % IV SOLN
900.0000 mg | Freq: Once | INTRAVENOUS | Status: AC
  Administered 2019-08-11: 900 mg via INTRAVENOUS
  Filled 2019-08-10: qty 36

## 2019-08-10 MED ORDER — SODIUM CHLORIDE 0.9 % IV SOLN
20.0000 mg | Freq: Once | INTRAVENOUS | Status: AC
  Administered 2019-08-11: 20 mg via INTRAVENOUS
  Filled 2019-08-10 (×2): qty 2

## 2019-08-10 NOTE — Progress Notes (Signed)
IR requested by Myrtha Mantis, NP for possible image-guided bone marrow biopsy/aspiration.  Plan for bone marrow biopsy in IR when patient can tolerate extubation- plan tentatively for 08/12/2019- will call RN AM of procedure. Patient to be NPO/tube feeds held 08/12/2019 at 0001. Patient has been seen/consented for procedure (see consult note from earlier this AM). Meriel Pica, RN aware of above.  Please call IR with questions/concerns.   Bea Graff Saphira Lahmann, PA-C 08/10/2019, 2:20 PM

## 2019-08-10 NOTE — Plan of Care (Signed)
  Problem: Clinical Measurements: Goal: Will remain free from infection Outcome: Not Progressing   Problem: Clinical Measurements: Goal: Respiratory complications will improve Outcome: Not Progressing   Problem: Clinical Measurements: Goal: Cardiovascular complication will be avoided Outcome: Not Progressing   

## 2019-08-10 NOTE — Consult Note (Signed)
 Chief Complaint: Patient was seen in consultation today for Bone marrow biopsy Chief Complaint  Patient presents with  . Shortness of Breath   at the request of Dr M Mohamed   Supervising Physician: Wagner, Jaime  Patient Status: [facility] - In-pt  History of Present Illness: Larry Lam is a 48 y.o. male   Medical history significant forrecent diagnosis of CLL/CML in August 2020, acute on chronic respiratory failure on 2 L O2, COPD, seizures, diabetes mellitus, hypertension. Adm to [facility] 12/10 Resp failure; [facility]NA Covid neg Worsened condition-- Resp/cardiac arrest-- [facility] Failed extubation yesterday--- re intubated now He is alert Possible worsening CLL; with chest and abd bulky LAN; anemia; thrombocytopenia (CLL followed with Dr Yu at [facility])  Request for Bone marrow bx Per Dr Mohamed   CT 08/06/19: IMPRESSION: 1. Moderate to large bilateral pleural effusions with adjacent atelectasis. 2. Extensive adenopathy throughout the abdomen and pelvis, similar to slightly improved when compared to September 2020 CT. 3. Splenomegaly. 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  Dr Mohamed note:  Progressive CLL -The patient has a history of lymphadenopathy since 2010. -He was diagnosed in August 2020 by flow cytometry, CD5 positive, CD23 positive lymphocytes consistent with CLL -CLL FISH panel showed ATM mutation. T p53 by FISH was negative. I GVH mutation positive. -He developed worsening anemia and thrombocytopenia along with constitutional symptoms including anorexia, weight loss, and bulky adenopathy. -Repeat flow cytometry on 08/03/2019 was again consistent with CLL, CD5 positive, CD23 positive. -CT angiogram of the chest on 08/05/2019 and CT of the abdomen pelvis with contrast on 08/06/2019 show continued widespread bulky adenopathy. -PET scan and bone marrow biopsy have been ordered, but not yet completed. -The patient was under consideration  to begin treatment with venetoclax and obinutuzumab pending completion of work-up. -Recommend proceeding with bone marrow biopsy in interventional radiology when the patient is medically stable unable to proceed. -Further recommendations pending completion of work-up.  Plan for bone marrow bx tentatively 12/16 Will check pt status and if still intubated tomorrow am Will move ahead when extubation is tolerated  Past Medical History:  Diagnosis Date  . Asthma   . COPD (chronic obstructive pulmonary disease) (HCC)   . Diabetes mellitus without complication (HCC)   . Hypertension   . Seizures (HCC)    3-4 years ago. Single episode.  . Tobacco use     History reviewed. No pertinent surgical history.  Allergies: Amlodipine besylate-valsartan and Hydrochlorothiazide  Medications: Prior to Admission medications   Medication Sig Start Date End Date Taking? Authorizing Provider  albuterol (PROVENTIL HFA;VENTOLIN HFA) 108 (90 Base) MCG/ACT inhaler Inhale 2 puffs into the lungs every 6 (six) hours as needed for wheezing or shortness of breath. 04/29/17  Yes Tat, David, MD  ASPIRIN 81 PO Take 81 mg by mouth.   Yes [provider]  atorvastatin (LIPITOR) 40 MG tablet Take 1 tablet by mouth daily. 08/04/19  Yes [provider]  azithromycin (ZITHROMAX Z-PAK) 250 MG tablet Take 1 tablet (250 mg total) by mouth daily. 500mg PO day 1, then 250mg PO days 205 08/02/19  Yes Miller, Brian, MD  escitalopram (LEXAPRO) 20 MG tablet Take 20 mg by mouth daily. 05/11/19  Yes [provider]  glipiZIDE (GLUCOTROL) 5 MG tablet Take 1 tablet (5 mg total) by mouth 2 (two) times daily. 04/29/17 08/05/19 Yes Tat, David, MD  ipratropium-albuterol (DUONEB) 0.5-2.5 (3) MG/3ML SOLN Take 3 mLs by nebulization every 6 (six) hours as   needed. J44.9 and J44.1 04/29/17  Yes Tat, David, MD  lisinopril (PRINIVIL,ZESTRIL) 20 MG tablet Take 1 tablet (20 mg total) by mouth daily. 04/30/17  Yes Tat, David, MD    metFORMIN (GLUCOPHAGE) 500 MG tablet Take 1 tablet (500 mg total) by mouth 2 (two) times daily with a meal. Patient taking differently: Take 1,000 mg by mouth 2 (two) times daily with a meal.  04/29/17  Yes Tat, David, MD  umeclidinium bromide (INCRUSE ELLIPTA) 62.5 MCG/INH AEPB Inhale into the lungs. 04/06/19  Yes [provider]  vitamin B-12 (CYANOCOBALAMIN) 1000 MCG tablet Take 1 tablet (1,000 mcg total) by mouth daily. 08/02/19  Yes Yu, Zhou, MD  Fluticasone-Salmeterol (ADVAIR) 250-50 MCG/DOSE AEPB Inhale into the lungs. 04/06/19   [provider]     Family History  Problem Relation Age of Onset  . Lung cancer Maternal Grandfather     Social History   Socioeconomic History  . Marital status: Married    Spouse name: Not on file  . Number of children: Not on file  . Years of education: Not on file  . Highest education level: Not on file  Occupational History  . Not on file  Tobacco Use  . Smoking status: Former Smoker    Packs/day: 1.00    Years: 33.00    Pack years: 33.00    Types: Cigarettes    Quit date: 03/23/2019    Years since quitting: 0.3  . Smokeless tobacco: Former User  Substance and Sexual Activity  . Alcohol use: Yes    Alcohol/week: 0.0 standard drinks    Comment: rare  . Drug use: No    Comment: smokes pot  . Sexual activity: Not on file  Other Topics Concern  . Not on file  Social History Narrative  . Not on file   Social Determinants of Health   Financial Resource Strain:   . Difficulty of Paying Living Expenses: Not on file  Food Insecurity:   . Worried About Running Out of Food in the Last Year: Not on file  . Ran Out of Food in the Last Year: Not on file  Transportation Needs:   . Lack of Transportation (Medical): Not on file  . Lack of Transportation (Non-Medical): Not on file  Physical Activity:   . Days of Exercise per Week: Not on file  . Minutes of Exercise per Session: Not on file  Stress:   . Feeling of Stress : Not  on file  Social Connections:   . Frequency of Communication with Friends and Family: Not on file  . Frequency of Social Gatherings with Friends and Family: Not on file  . Attends Religious Services: Not on file  . Active Member of Clubs or Organizations: Not on file  . Attends Club or Organization Meetings: Not on file  . Marital Status: Not on file    Review of Systems: A 12 point ROS discussed and pertinent positives are indicated in the HPI above.  All other systems are negative.  Review of Systems  Constitutional: Negative for fever.  Psychiatric/Behavioral: Negative for behavioral problems and confusion.    Vital Signs: BP (!) 164/79   Pulse 64   Temp 98.9 F (37.2 C) (Oral)   Resp 19   Ht 5' 10" (1.778 m)   Wt 223 lb 5.2 oz (101.3 kg)   SpO2 98%   BMI 32.04 kg/m   Physical Exam Vitals reviewed.  Cardiovascular:     Rate and Rhythm: Normal rate and   regular rhythm.  Pulmonary:     Comments: intubated/vent Musculoskeletal:        General: Normal range of motion.  Skin:    General: Skin is warm and dry.  Neurological:     Mental Status: He is alert and oriented to person, place, and time.  Psychiatric:        Behavior: Behavior normal.     Comments: Pt is alert/oriented Cannot speak intubated But can write answers to questions He is able to answer all questions correctly Signed consent     Imaging: DG Chest 1 View  Result Date: 08/09/2019 CLINICAL DATA:  ETT placement EXAM: CHEST  1 VIEW COMPARISON:  Radiographs 08/09/2019 FINDINGS: Endotracheal tube in the mid trachea, 4.5 cm from the carina. Transesophageal tube tip beyond the GE junction curling in the left upper quadrant, terminating below the level of imaging. Diffuse interstitial pulmonary opacity throughout both lungs with few more patchy areas of opacity. Some right basilar pleural thickening compatible with fusion. Suspect at least trace left effusion as well. Cardiomediastinal contours are stable. No  acute osseous or soft tissue abnormality. IMPRESSION: 1. Endotracheal tube 4.5 cm from the carina. 2. Transesophageal tube tip curls in the left upper quadrant, terminating below the level of imaging. 3. Diffuse interstitial airspace opacities compatible with infection and/or edema. Overall similar in extent to comparison studies. Electronically Signed   By: Price  DeHay M.D.   On: 08/09/2019 14:40   DG Chest 2 View  Result Date: 08/02/2019 CLINICAL DATA:  Shortness of breath, recent discharge, CLL EXAM: CHEST - 2 VIEW COMPARISON:  Chest CT 04/30/2019, chest radiograph 08/01/2019 FINDINGS: Diffusely increased interstitial markings are again seen. No pneumothorax. More nodular masslike opacity seen in the left costophrenic sulcus possibly reflecting some prominent lymph nodes in the pericardial fat pad on comparison chest CT. Additional ovoid opacities are seen in the soft tissues of the axilla and base of the neck. Bilateral effusions are noted. Cardiomediastinal contours are similar to prior with nodular densities in the hila likely reflecting adenopathy. No acute osseous abnormality. Degenerative changes in the shoulders. IMPRESSION: Nonspecific increased interstitial markings with bilateral effusions. Could reflect atypical infection or edema. Nodular opacity in the left costophrenic sulcus may reflect adenopathy within the pericardial fat. Additional nodular opacities in the hila and axilla likely reflecting further adenopathy in the setting of CLL. Electronically Signed   By: Price  DeHay M.D.   On: 08/02/2019 22:13   DG Chest 2 View  Result Date: 07/31/2019 CLINICAL DATA:  Shortness of breath. EXAM: CHEST - 2 VIEW COMPARISON:  April 27, 2017 FINDINGS: Cardiomediastinal silhouette is stable. No pneumothorax. No nodules or masses. Increased lung markings bilaterally with interstitial components. There is a small left pleural effusion. IMPRESSION: 1. Increase lung markings centrally with interstitial  components suggestive of atypical infection/pneumonia. Bronchitis is a possibility. There is a small associated left effusion. Electronically Signed   By: David  Williams III M.D   On: 07/31/2019 02:34   CT Angio Chest PE W/Cm &/Or Wo Cm  Result Date: 08/05/2019 CLINICAL DATA:  Shortness of breath, CLL EXAM: CT ANGIOGRAPHY CHEST WITH CONTRAST TECHNIQUE: Multidetector CT imaging of the chest was performed using the standard protocol during bolus administration of intravenous contrast. Multiplanar CT image reconstructions and MIPs were obtained to evaluate the vascular anatomy. CONTRAST:  100mL OMNIPAQUE IOHEXOL 350 MG/ML SOLN COMPARISON:  CT chest abdomen pelvis, 04/30/2011 FINDINGS: Cardiovascular: Satisfactory opacification of the pulmonary arteries to the segmental level. No evidence of pulmonary embolism.   Normal heart size. No pericardial effusion. Mediastinum/Nodes: Numerous bulky bilateral axillary, lower cervical, mediastinal, and hilar lymph nodes, similar in size compared to CT dated 04/30/2019. Thyroid gland, trachea, and esophagus demonstrate no significant findings. Lungs/Pleura: Small bilateral pleural effusions. Diffuse bilateral interlobular septal thickening and scattered ground-glass opacities throughout the lungs. There are occasional small pulmonary nodules, which are new or enlarged when compared to prior CT. A nodule of the right pulmonary apex measures 7 mm, previously 3 mm when measured similarly (series 7, image 24). A new nodule of the right lower lobe measures 6 mm (series 7, image 84). Upper Abdomen: No acute abnormality. There is redemonstrated bulky celiac axis and gastrohepatic ligament lymphadenopathy, again similar to prior examination dated 04/30/2019 to the extent imaged. Musculoskeletal: No chest wall abnormality. No acute or significant osseous findings. Review of the MIP images confirms the above findings. IMPRESSION: 1.  Negative examination for pulmonary embolism. 2. Small  bilateral pleural effusions with diffuse bilateral interlobular septal thickening and scattered ground-glass opacity throughout the lungs. These findings are consistent with infection or edema. 3. There are occasional small pulmonary nodules, which are new or enlarged when compared to prior CT. A nodule of the right pulmonary apex measures 7 mm, previously 3 mm when measured similarly (series 7, image 24). A new nodule of the right lower lobe measures 6 mm (series 7, image 84). These are most likely infectious or inflammatory; pulmonary nodules are an unlikely manifestation of pulmonary involvement of lymphoma. 4. Numerous bulky bilateral axillary, lower cervical, mediastinal, and hilar lymph nodes, as well as bulky lymph nodes in the partially imaged upper abdomen, similar in size compared to CT dated 04/30/2019 and in keeping with diagnosis of CLL. Electronically Signed   By: Alex  Bibbey M.D.   On: 08/05/2019 15:26   CT ABDOMEN PELVIS W CONTRAST  Result Date: 08/06/2019 CLINICAL DATA:  CLL/SLL. EXAM: CT ABDOMEN AND PELVIS WITH CONTRAST TECHNIQUE: Multidetector CT imaging of the abdomen and pelvis was performed using the standard protocol following bolus administration of intravenous contrast. CONTRAST:  100mL OMNIPAQUE IOHEXOL 300 MG/ML  SOLN COMPARISON:  04/30/2019 FINDINGS: Lower chest: There are moderate to large bilateral pleural effusions that are only partially visualized on this exam.The heart size is borderline enlarged. There are few ground-glass airspace opacities at the lung bases with interlobular septal thickening suggestive of volume overload/developing pulmonary edema. Hepatobiliary: The liver is normal. There is layering hyperdense material within the gallbladder lumen. This may represent gallbladder sludge or vicarious excretion of contrast from the patient's prior contrast enhanced CT.There is no biliary ductal dilation. Pancreas: Normal contours without ductal dilatation. No peripancreatic  fluid collection. Spleen: The spleen is significantly enlarged Adrenals/Urinary Tract: --Adrenal glands: No adrenal hemorrhage. --Right kidney/ureter: No hydronephrosis or perinephric hematoma. --Left kidney/ureter: No hydronephrosis or perinephric hematoma. --Urinary bladder: Unremarkable. Stomach/Bowel: --Stomach/Duodenum: No hiatal hernia or other gastric abnormality. Normal duodenal course and caliber. --Small bowel: No dilatation or inflammation. --Colon: No focal abnormality. --Appendix: Normal. Vascular/Lymphatic: Atherosclerotic calcification is present within the non-aneurysmal abdominal aorta, without hemodynamically significant stenosis. --there is extensive retroperitoneal adenopathy --there is extensive mesenteric adenopathy --there is extensive pelvic and inguinal adenopathy. Overall, the adenopathy appears to be relatively stable to slightly improved when compared to prior study. Reproductive: Unremarkable Other: No ascites or free air. The abdominal wall is normal. Musculoskeletal. 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 There is no acute displaced fracture. IMPRESSION: 1. Moderate to large bilateral pleural effusions with adjacent   atelectasis. 2. Extensive adenopathy throughout the abdomen and pelvis, similar to slightly improved when compared to September 2020 CT. 3. Splenomegaly. 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 Aortic Atherosclerosis (ICD10-I70.0). Electronically Signed   By: Christopher  Green M.D.   On: 08/06/2019 22:34   DG Chest Port 1 View  Result Date: 08/10/2019 CLINICAL DATA:  Respiratory failure. EXAM: PORTABLE CHEST 1 VIEW COMPARISON:  August 09, 2019. FINDINGS: Stable cardiomediastinal silhouette. Endotracheal and nasogastric tubes are unchanged in position. No pneumothorax or pleural effusion is noted. Stable bibasilar opacities are noted concerning for edema or possibly inflammation. Bony thorax is unremarkable.  IMPRESSION: Stable support apparatus. Stable bibasilar opacities are noted concerning for edema or possibly inflammation. Electronically Signed   By: James  Green Jr M.D.   On: 08/10/2019 10:29   DG Chest Port 1 View  Result Date: 08/09/2019 CLINICAL DATA:  Endotracheal tube EXAM: PORTABLE CHEST 1 VIEW COMPARISON:  08/08/2019 FINDINGS: No significant interval change in AP portable examination with endotracheal tube and esophagogastric tube in position. There remains mild, diffuse interstitial pulmonary opacity throughout, consistent with mild edema or infection. No new or focal airspace opacity. The heart and mediastinum are unremarkable. IMPRESSION: No significant interval change in diffuse interstitial opacity throughout both lungs, consistent with mild edema or infection. Electronically Signed   By: Alex  Bibbey M.D.   On: 08/09/2019 08:07   DG CHEST PORT 1 VIEW  Result Date: 08/08/2019 CLINICAL DATA:  48-year-old male status post cardiac arrest yesterday. History of CLL and suspected recent pneumonia. Negative for COVID-19. EXAM: PORTABLE CHEST 1 VIEW COMPARISON:  08/07/2019 and earlier. FINDINGS: Portable AP semi upright view at 0531 hours. Endotracheal tube tip in good position between the level the clavicles and carina. Enteric tube courses to the abdomen, tip not included. Course and asymmetric bilateral pulmonary interstitial opacity with regressed more confluent perihilar opacity since yesterday. The ventilation now resembles that on 08/05/2019. There is no longer confluent opacity at the left lateral lung base. No pneumothorax or pleural effusion. Paucity of bowel gas. No acute osseous abnormality identified. IMPRESSION: 1. Satisfactory lines and tubes. 2. Improved ventilation since yesterday with regressed perihilar opacity. Continued coarse interstitial opacity resembling that on 08/05/2019 which was also demonstrated by CTA at that time. 3. No new cardiopulmonary abnormality. Electronically  Signed   By: H  Hall M.D.   On: 08/08/2019 10:48   DG CHEST PORT 1 VIEW  Result Date: 08/07/2019 CLINICAL DATA:  Tube placement, code EXAM: PORTABLE CHEST 1 VIEW COMPARISON:  06/05/2019 FINDINGS: Interval placement of endotracheal tube, tip projecting over the mid trachea. Esophagogastric tube, poorly visualized due to underpenetration although appears to be with tip and side port below the diaphragm. There is mild, diffuse interstitial pulmonary opacity. Layering pleural effusions seen on prior CT are not well appreciated. No new airspace opacity. IMPRESSION: 1. Interval placement of endotracheal tube, tip projecting over the mid trachea. 2. Esophagogastric tube, poorly visualized due to underpenetration although appears to be with tip and side port below the diaphragm. 3. There is mild, diffuse interstitial pulmonary opacity. Layering pleural effusions seen on prior CT are not well appreciated. No new airspace opacity. Electronically Signed   By: Alex  Bibbey M.D.   On: 08/07/2019 14:10   DG Chest Port 1 View  Result Date: 08/05/2019 CLINICAL DATA:  PT c/o worsening SOB since last night. Pt states he is normally somewhat SOB at baseline. Hx HTN, diabetes, COPD, asthma, former smoker EXAM: PORTABLE CHEST - 1 VIEW COMPARISON:    08/02/2019 FINDINGS: Increasing patchy airspace infiltrate laterally at the left lung base. Diffuse interstitial markings throughout both lungs as before. Heart size normal. Right paratracheal soft tissue consistent with adenopathy. The aortopulmonary window is obscured suggesting adenopathy or mass. Blunting of left lateral costophrenic angle. No pneumothorax. Visualized bones unremarkable. IMPRESSION: 1. Increasing patchy airspace infiltrate laterally at the left lung base suspicious for pneumonia. 2. Probable mediastinal adenopathy. 3. Diffuse interstitial markings throughout both lungs as before. Electronically Signed   By: Lucrezia Europe M.D.   On: 08/05/2019 09:12   DG CHEST PORT  1 VIEW  Result Date: 08/01/2019 CLINICAL DATA:  48 year old male with weakness. Acute on chronic anemia, thrombocytopenia. Chronic lymphocytic leukemia. Negative for COVID-19. Yesterday. EXAM: PORTABLE CHEST 1 VIEW COMPARISON:  Chest radiographs 07/31/2019 and earlier. FINDINGS: Portable AP upright view at 1518 hours. Stable lung volumes and mediastinal contours. Mediastinal lymphadenopathy demonstrated by CT in September likely persists. Stable mild bilateral nonspecific pulmonary interstitial markings, slightly greater on the right. No superimposed pneumothorax, pleural effusion or consolidation. Visualized tracheal air column is within normal limits. Negative visible bowel gas pattern. No acute osseous abnormality identified. IMPRESSION: 1. Persistent nonspecific pulmonary interstitial markings greater on the right. Doubt pulmonary edema. Atypical infection remains possible. But this might be related to the patient's CLL given suggestion of some nonspecific interstitial opacity by CT in September. 2. Evidence of mediastinal lymphadenopathy stable since September. 3. No new cardiopulmonary abnormality since yesterday. Electronically Signed   By: Genevie Ann M.D.   On: 08/01/2019 16:59   DG Abd Portable 1V  Result Date: 08/09/2019 CLINICAL DATA:  OG tube placement EXAM: PORTABLE ABDOMEN - 1 VIEW COMPARISON:  Concurrent chest radiograph, CT abdomen pelvis 08/06/2019 FINDINGS: Tip and side port of the transesophageal tube or distal to the GE junction, terminating in the region of the gastric antrum/duodenal bulb. Bowel gas pattern is nonobstructive. No acute soft tissue abnormality is seen. Osseous structures are unremarkable. IMPRESSION: 1. Tip of the transesophageal tube in the region of the gastric antrum/duodenal bulb. 2. Nonobstructive bowel gas pattern. Electronically Signed   By: Lovena Le M.D.   On: 08/09/2019 14:41   ECHOCARDIOGRAM COMPLETE  Result Date: 08/08/2019   ECHOCARDIOGRAM REPORT    Patient Name:   Kemal ALAA EYERMAN Date of Exam: 08/08/2019 Medical Rec #:  [id number]            Height:       70.0 in Accession #:    [id number]           Weight:       223.3 lb Date of Birth:  [birth year]            BSA:          2.19 m Patient Age:    64 years             BP:           119/61 mmHg Patient Gender: M                    HR:           68 bpm. Exam Location:  Inpatient Procedure: 2D Echo Indications:    Cardiac arrest I46.9  History:        Patient has prior history of Echocardiogram examinations, most                 recent 04/28/2017. COPD, Signs/Symptoms:Hypotension; Risk  Factors:Diabetes. Acute on chronic respiratory failure.  Sonographer:    Vikki Ports Turrentine Referring Phys: [id number] Francesca Jewett  Sonographer Comments: Technically difficult study due to poor echo windows and echo performed with patient supine and on artificial respirator. Image acquisition challenging due to patient body habitus. IMPRESSIONS  1. Left ventricular ejection fraction, by visual estimation, is 55 to 60%. The left ventricle has normal function. There is no left ventricular hypertrophy.  2. Left ventricular diastolic parameters are consistent with Grade I diastolic dysfunction (impaired relaxation).  3. The left ventricle has no regional wall motion abnormalities.  4. Global right ventricle has mildly reduced systolic function.The right ventricular size is normal. No increase in right ventricular wall thickness.  5. Left atrial size was mildly dilated.  6. Right atrial size was normal.  7. Trivial pericardial effusion is present.  8. The mitral valve is grossly normal. Trivial mitral valve regurgitation.  9. The tricuspid valve is grossly normal. Tricuspid valve regurgitation is trivial. 10. The aortic valve is tricuspid. Aortic valve regurgitation is not visualized. 11. The pulmonic valve was grossly normal. Pulmonic valve regurgitation is trivial. 12. Moderately elevated pulmonary artery systolic  pressure. 13. The inferior vena cava is dilated in size with <50% respiratory variability, suggesting right atrial pressure of 15 mmHg. FINDINGS  Left Ventricle: Left ventricular ejection fraction, by visual estimation, is 55 to 60%. The left ventricle has normal function. The left ventricle has no regional wall motion abnormalities. There is no left ventricular hypertrophy. Left ventricular diastolic parameters are consistent with Grade I diastolic dysfunction (impaired relaxation). Indeterminate filling pressures. Right Ventricle: The right ventricular size is normal. No increase in right ventricular wall thickness. Global RV systolic function is has mildly reduced systolic function. The tricuspid regurgitant velocity is 2.65 m/s, and with an assumed right atrial pressure of 15 mmHg, the estimated right ventricular systolic pressure is moderately elevated at 43.1 mmHg. Left Atrium: Left atrial size was mildly dilated. Right Atrium: Right atrial size was normal in size Pericardium: Trivial pericardial effusion is present. Mitral Valve: The mitral valve is grossly normal. Trivial mitral valve regurgitation. Tricuspid Valve: The tricuspid valve is grossly normal. Tricuspid valve regurgitation is trivial. Aortic Valve: The aortic valve is tricuspid. Aortic valve regurgitation is not visualized. Pulmonic Valve: The pulmonic valve was grossly normal. Pulmonic valve regurgitation is trivial. Pulmonic regurgitation is trivial. Aorta: The aortic root and ascending aorta are structurally normal, with no evidence of dilitation. Venous: IVC assessment for right atrial pressure unable to be performed due to mechanical ventilation. The inferior vena cava is dilated in size with less than 50% respiratory variability, suggesting right atrial pressure of 15 mmHg. IAS/Shunts: No atrial level shunt detected by color flow Doppler.  LEFT VENTRICLE PLAX 2D LVIDd:         4.80 cm  Diastology LVIDs:         3.30 cm  LV e' lateral:    10.40 cm/s LV PW:         1.00 cm  LV E/e' lateral: 12.9 LV IVS:        1.00 cm  LV e' medial:    7.83 cm/s LVOT diam:     2.10 cm  LV E/e' medial:  17.1 LV SV:         63 ml LV SV Index:   27.96 LVOT Area:     3.46 cm  LEFT ATRIUM             Index  RIGHT ATRIUM           Index LA diam:        4.10 cm 1.87 cm/m  RA Area:     16.90 cm LA Vol (A2C):   44.2 ml 20.21 ml/m RA Volume:   45.60 ml  20.85 ml/m LA Vol (A4C):   79.0 ml 36.11 ml/m LA Biplane Vol: 62.3 ml 28.48 ml/m  AORTIC VALVE LVOT Vmax:   112.00 cm/s LVOT Vmean:  82.200 cm/s LVOT VTI:    0.215 m  AORTA Ao Root diam: 3.40 cm MITRAL VALVE                         TRICUSPID VALVE MV Area (PHT): 3.54 cm              TR Peak grad:   28.1 mmHg MV PHT:        62.06 msec            TR Vmax:        265.00 cm/s MV Decel Time: 214 msec MV E velocity: 134.00 cm/s 103 cm/s  SHUNTS MV A velocity: 115.00 cm/s 70.3 cm/s Systemic VTI:  0.22 m MV E/A ratio:  1.17        1.5       Systemic Diam: 2.10 cm  Lyman Bishop MD Electronically signed by Lyman Bishop MD Signature Date/Time: 08/08/2019/11:37:42 AM    Final     Labs:  CBC: Recent Labs    08/07/19 1401 08/08/19 0502 08/08/19 1555 08/09/19 0514 08/09/19 0558 08/10/19 0408  WBC 97.2* 21.1* 26.0*  --  21.8*  22.0*  --   HGB 8.5* 6.5* 7.5* 7.1* 7.4*  7.6* 11.9*  HCT 30.3* 20.3* 23.5* 21.0* 23.0*  23.4* 35.0*  PLT 40* 24* 26*  --  26*  25*  --     COAGS: No results for input(s): INR, APTT in the last 8760 hours.  BMP: Recent Labs    08/07/19 1141 08/07/19 1401 08/08/19 0502 08/09/19 0514 08/09/19 0558 08/10/19 0408  NA 140 141 142 140 141 139  K 4.7 4.0 4.6 3.8 3.9 4.7  CL 93* 94* 96*  --  98  --   CO2 36* 34* 36*  --  33*  --   GLUCOSE 90 122* 104*  --  88  --   BUN 27* 29* 41*  --  45*  --   CALCIUM 8.7* 8.9 8.5*  --  8.5*  --   CREATININE 0.71 1.00 1.57*  --  1.28*  --   GFRNONAA >60 >60 51*  --  >60  --   GFRAA >60 >60 59*  --  >60  --     LIVER FUNCTION  TESTS: Recent Labs    04/23/19 1358 07/31/19 0152 08/08/19 0502  BILITOT 0.4 0.4 1.0  AST 14* 9* 23  ALT _0 ALKPHOS 89 87 63  PROT 7.4 7.0 5.6*  ALBUMIN 3.8 3.3* 2.5*    TUMOR MARKERS: No results for input(s): AFPTM, CEA, CA199, CHROMGRNA in the last 8760 hours.  Assessment and Plan:  Hx Chronic lymphocytic leukemia Follows in [facility]- Dr Tasia Catchings Admitted [facility] with PNA Cardiac and respiratory arrest-- intubated and Tx to [facility] Failed extubation yesterday Plan for Bone marrow biopsy when can tolerate extubation Tentatively scheduled for 12/16--- we will call RN in am Risks and benefits of Bone marrow biopsy was discussed with the patient and/or patient's family including, but  not limited to bleeding, infection, damage to adjacent structures or low yield requiring additional tests.  All of the questions were answered and there is agreement to proceed.  Consent signed and in chart.   Thank you for this interesting consult.  I greatly enjoyed meeting DONTAY HARM and look forward to participating in their care.  A copy of this report was sent to the requesting provider on this date.  Electronically Signed: Lavonia Drafts, PA-C 08/10/2019, 10:45 AM   I spent a total of 20 Minutes    in face to face in clinical consultation, greater than 50% of which was counseling/coordinating care for Bone marrow bx

## 2019-08-10 NOTE — Progress Notes (Signed)
Coordinated with [facility] pharmacy and chemo pharmacist for Obinutuzumab infusion today. Pt will need Day 2 on 12/16.

## 2019-08-10 NOTE — Progress Notes (Addendum)
NAME:  Larry Lam, MRN:  [id number], DOB:  [birth year], LOS: 5 ADMISSION DATE:  08/05/2019, CONSULTATION DATE: 08/07/2019 REFERRING MD: TRH, CHIEF COMPLAINT: Status post arrest  Brief History   48 y.o.malewith medical history significant forrecent diagnosis of CLL/CML in August 2020, acute on chronic respiratory failure on 2 L O2, COPD, seizures, diabetes mellitus, hypertension.  Admitted to [facility] on 12/10 with acute on chronic respiratory failure, pneumonia Had respiratory/cardiac arrest on 12/12 after he removed his nasal cannula.  Intubated and transferred to [facility].  Failed trial of extubation 12/14 requiring reintubation.  Questioning for progressive CLL +/- pneumonia given progressive chest and abdominal bulky adenopathy with anemia and thrombocytopenia.  Oncology consulted 12/14.    Past Medical History  Asthma, COPD, diabetes, hypertension, CHF, CLL/CML, seizures, HTN  Significant Hospital Events   12/10 Admitted to [facility]  12/12 PEA arrest secondary to hypoxemia, transfer to [facility] 12/14 failed extubation trial, reintubated/ bronch, oncology consulted, started on high dose steroids  Consults:  PCCM Oncology 12/14 IR 12/14 for bone marrow biopsy   Procedures:  ETT 12/12 > 12/14; 12/14 >> Femoral CVL 12/12 >  Significant Diagnostic Tests:  CTA 12/10-no pulmonary embolism, small effusion with scattered groundglass opacities.  Subcentimeter pulmonary nodule, bulky axillary, cervical, mediastinal and hilar lymph nodes.  CT abdomen 12/11-extensive adenopathy, splenomegaly, avascular necrosis of hips.  Micro Data:  12/10 Sars-CoV-2>>neg 12/10 Influenza A/B>>neg 12/13 tracheal aspirate >> 12/14 BAL pneumocystis >> 12/14 BAL fungal >> 12/14 BAL cx >>  Antimicrobials:  Azithromycin 12/10 >> 12/14 Cefepime 12/10 >> 12/14 Vancomycin 12/10 >>12/11  Interim history/subjective:    Objective   Blood pressure (!) 164/79, pulse 64, temperature  98.9 F (37.2 C), temperature source Oral, resp. rate 19, height '5\' 10"'$  (1.778 m), weight 101.3 kg, SpO2 98 %. CVP:  [10 mmHg-13 mmHg] 10 mmHg  Vent Mode: PRVC FiO2 (%):  [40 %-100 %] 50 % Set Rate:  [20 bmp] 20 bmp Vt Set:  [580 mL] 580 mL PEEP:  [5 cmH20-6 cmH20] 5 cmH20 Pressure Support:  [8 cmH20] 8 cmH20 Plateau Pressure:  [16 cmH20-23 cmH20] 18 cmH20   Intake/Output Summary (Last 24 hours) at 08/10/2019 0936 Last data filed at 08/10/2019 0900 Gross per 24 hour  Intake 668.41 ml  Output 417 ml  Net 251.41 ml   Filed Weights   08/05/19 0837 08/07/19 0400  Weight: 103 kg 101.3 kg    Examination: General:  Pale dult male sitting upright in bed in NAD HEENT: MM pale/moist, pupils 4/reactive, anicteric, ETT / OGT Neuro: Awake, f/c, MAE CV: rr, no murmur PULM:  Nonlabored doing well on SBT 8/5 currently, BS CTA GI: obese, soft, bs active, NT, foley  Extremities: warm/dry, no LE edema  Skin: no rashes  Resolved Hospital Problem list    Assessment & Plan:  PEA arrest- related to hypoxemia/ resp failure P:  Remains hemodynamically stable Tele monitoring   Acute hypoxic respiratory failure COPD- doubt flare Abnormal CT chest- question of pneumonia vs. Aspiration vs. CLL involvement, Pct neg.  He had similar GGO on top of emphysema on his CT in September, wonder if all related to CLL with superimposed inflammatory effect vs. Edema. ? Previous diagnosis of sarcoidosis P:  Failed extubation trial 12/14 requiring reintubation  Full MV support PRVC 8 cc/kg Decrease rate to 16 Minimal FiO2/ PEEP support Duonebs, pulmicort, prn albuterol  VAP bundle Follow BAL cx Pending CXR, prn ABG Would likely benefit from some diuresis   Daily  SBT  Continue solumedrol 60 mg q 6 hrs PAD protocol with precedex, prn fentanyl w/bowel regimen for RASS goal 0/-1 Start TF  AKI, improving hypophos  P:  Renal panel and mag now Trend BMP / urinary output Replace electrolytes as  indicated Avoid nephrotoxic agents, ensure adequate renal perfusion   CLL Anemia Thrombocytopneia - recent dx in August 2020 with progressive anemia and thrombocytopenia and increasing adenopathy concerning for progressive disease  P:  Wife request transfer of oncology care to here from [place] assistance PICC to be placed, once placed can d/c femoral CVL To start obinutuzamab 12/15 IR consulted for bone biopsy  B12 SQ daily per oncology  Pending uric acid Transfuse for Plt < 20k; Hgb < 7  Hyperglcyemia - steroid induced P:  Add SSI   Best practice:  Diet: start TF Pain/Anxiety/Delirium protocol (if indicated): precedex, prn fentanyl VAP protocol (if indicated): Yes DVT prophylaxis: SCDs, no heparin SQ given thrombocytopenia GI prophylaxis: PPI Glucose control: Monitor, on lower end Mobility: Bedrest Code Status: Full Family Communication: wife updated at bedside  Disposition:ICU  CCT: 74 mins  Kennieth Rad, MSN, AGACNP-BC [facility] 08/10/2019, 9:36 AM  Attending Note:  48 year old male with CLL who presents with respiratory failure with likely a combination of PNA, pulmonary edema and CLL.  Reintubated after failing extubation yesterday.  On exam, diffuse crackles.  I reviewed CXR myself, ETT is in a good position.  Discussed with PCCM-NP.  Will start lasix 40 mg IV q6 x3 doses.  Zaroxolyn 10 mg PO x1 dose.  Continue decadron 6 mg IV daily.  Begin chemo.  Continue abx as ordered.  Begin PS trails.  Hold off extubation today.  PCCM will continue to follow.  The patient is critically ill with multiple organ systems failure and requires high complexity decision making for assessment and support, frequent evaluation and titration of therapies, application of advanced monitoring technologies and extensive interpretation of multiple databases.   Critical Care Time devoted to patient care services described in this note is  32   Minutes. This time reflects time of care of this signee Dr Jennet Maduro. This critical care time does not reflect procedure time, or teaching time or supervisory time of PA/NP/Med student/Med Resident etc but could involve care discussion time.  Rush Farmer, M.D. [facility]Critical Care Medicine.

## 2019-08-10 NOTE — Progress Notes (Signed)
HEMATOLOGY-ONCOLOGY PROGRESS NOTE  SUBJECTIVE: No new events overnight.  Remains on the ventilator.  Alert and able to respond to me.  Scheduled to receive obinutuzumab later today.  No bleeding noted.  Remains afebrile.  No family at the bedside.  Oncology History  CLL (chronic lymphocytic leukemia) ([place])  04/21/2019 Initial Diagnosis   CLL (chronic lymphocytic leukemia) ([place])   08/10/2019 -  Chemotherapy   The patient had obinutuzumab (GAZYVA) 100 mg in sodium chloride 0.9 % 100 mL (0.9615 mg/mL) chemo infusion, 100 mg, Intravenous, Once, 1 of 6 cycles  for chemotherapy treatment.      PHYSICAL EXAMINATION: ECOG PERFORMANCE STATUS: 3 - Symptomatic, >50% confined to bed  Vitals:   08/10/19 1147 08/10/19 1250  BP:    Pulse:    Resp:    Temp:  98.4 F (36.9 C)  SpO2: 99%    Filed Weights   08/05/19 0837 08/07/19 0400  Weight: 227 lb 1.2 oz (103 kg) 223 lb 5.2 oz (101.3 kg)    Intake/Output from previous day: 12/14 0701 - 12/15 0700 In: 508.9 [I.V.:466.7; IV Piggyback:12.2] Out: 124 [Urine:492]  GENERAL: Chronically ill-appearing male, no distress, remains on ventilator SKIN: No rashes or petechiae. EYES: PERRL, conjunctivae pink OROPHARYNX: ET tube in place, no thrush or mucositis LYMPH: Palpable cervical, axillary, inguinal lymphadenopathy LUNGS: clear to auscultation and percussion with normal breathing effort HEART: regular rate & rhythm and no murmurs and no lower extremity edema ABDOMEN:abdomen soft, non-tender and normal bowel sounds Musculoskeletal:no cyanosis of digits and no clubbing  NEURO: alert & oriented x 3  LABORATORY DATA:  I have reviewed the data as listed CMP Latest Ref Rng & Units 08/10/2019 08/10/2019 08/09/2019  Glucose 70 - 99 mg/dL 246(H) - 88  BUN 6 - 20 mg/dL 65(H) - 45(H)  Creatinine 0.61 - 1.24 mg/dL 1.37(H) - 1.28(H)  Sodium 135 - 145 mmol/L 140 139 141  Potassium 3.5 - 5.1 mmol/L 4.8 4.7 3.9  Chloride 98 - 111 mmol/L 98 - 98  CO2 22  - 32 mmol/L 29 - 33(H)  Calcium 8.9 - 10.3 mg/dL 8.5(L) - 8.5(L)  Total Protein 6.5 - 8.1 g/dL - - -  Total Bilirubin 0.3 - 1.2 mg/dL - - -  Alkaline Phos 38 - 126 U/L - - -  AST 15 - 41 U/L - - -  ALT 0 - 44 U/L - - -    Lab Results  Component Value Date   WBC 45.4 (H) 08/10/2019   HGB 8.3 (L) 08/10/2019   HCT 25.8 (L) 08/10/2019   MCV 92.8 08/10/2019   PLT 27 (LL) 08/10/2019   NEUTROABS 1.5 (L) 08/10/2019    DG Chest 1 View  Result Date: 08/09/2019 CLINICAL DATA:  ETT placement EXAM: CHEST  1 VIEW COMPARISON:  Radiographs 08/09/2019 FINDINGS: Endotracheal tube in the mid trachea, 4.5 cm from the carina. Transesophageal tube tip beyond the GE junction curling in the left upper quadrant, terminating below the level of imaging. Diffuse interstitial pulmonary opacity throughout both lungs with few more patchy areas of opacity. Some right basilar pleural thickening compatible with fusion. Suspect at least trace left effusion as well. Cardiomediastinal contours are stable. No acute osseous or soft tissue abnormality. IMPRESSION: 1. Endotracheal tube 4.5 cm from the carina. 2. Transesophageal tube tip curls in the left upper quadrant, terminating below the level of imaging. 3. Diffuse interstitial airspace opacities compatible with infection and/or edema. Overall similar in extent to comparison studies. Electronically Signed   By: March Rummage  [facility] M.D.   On: 08/09/2019 14:40   DG Chest 2 View  Result Date: 08/02/2019 CLINICAL DATA:  Shortness of breath, recent discharge, CLL EXAM: CHEST - 2 VIEW COMPARISON:  Chest CT 04/30/2019, chest radiograph 08/01/2019 FINDINGS: Diffusely increased interstitial markings are again seen. No pneumothorax. More nodular masslike opacity seen in the left costophrenic sulcus possibly reflecting some prominent lymph nodes in the pericardial fat pad on comparison chest CT. Additional ovoid opacities are seen in the soft tissues of the axilla and base of the neck.  Bilateral effusions are noted. Cardiomediastinal contours are similar to prior with nodular densities in the hila likely reflecting adenopathy. No acute osseous abnormality. Degenerative changes in the shoulders. IMPRESSION: Nonspecific increased interstitial markings with bilateral effusions. Could reflect atypical infection or edema. Nodular opacity in the left costophrenic sulcus may reflect adenopathy within the pericardial fat. Additional nodular opacities in the hila and axilla likely reflecting further adenopathy in the setting of CLL. Electronically Signed   By: Lovena Le M.D.   On: 08/02/2019 22:13   DG Chest 2 View  Result Date: 07/31/2019 CLINICAL DATA:  Shortness of breath. EXAM: CHEST - 2 VIEW COMPARISON:  April 27, 2017 FINDINGS: Cardiomediastinal silhouette is stable. No pneumothorax. No nodules or masses. Increased lung markings bilaterally with interstitial components. There is a small left pleural effusion. IMPRESSION: 1. Increase lung markings centrally with interstitial components suggestive of atypical infection/pneumonia. Bronchitis is a possibility. There is a small associated left effusion. Electronically Signed   By: Dorise Bullion III M.D   On: 07/31/2019 02:34   CT Angio Chest PE W/Cm &/Or Wo Cm  Result Date: 08/05/2019 CLINICAL DATA:  Shortness of breath, CLL EXAM: CT ANGIOGRAPHY CHEST WITH CONTRAST TECHNIQUE: Multidetector CT imaging of the chest was performed using the standard protocol during bolus administration of intravenous contrast. Multiplanar CT image reconstructions and MIPs were obtained to evaluate the vascular anatomy. CONTRAST:  120m OMNIPAQUE IOHEXOL 350 MG/ML SOLN COMPARISON:  CT chest abdomen pelvis, 04/30/2011 FINDINGS: Cardiovascular: Satisfactory opacification of the pulmonary arteries to the segmental level. No evidence of pulmonary embolism. Normal heart size. No pericardial effusion. Mediastinum/Nodes: Numerous bulky bilateral axillary, lower  cervical, mediastinal, and hilar lymph nodes, similar in size compared to CT dated 04/30/2019. Thyroid gland, trachea, and esophagus demonstrate no significant findings. Lungs/Pleura: Small bilateral pleural effusions. Diffuse bilateral interlobular septal thickening and scattered ground-glass opacities throughout the lungs. There are occasional small pulmonary nodules, which are new or enlarged when compared to prior CT. A nodule of the right pulmonary apex measures 7 mm, previously 3 mm when measured similarly (series 7, image 24). A new nodule of the right lower lobe measures 6 mm (series 7, image 84). Upper Abdomen: No acute abnormality. There is redemonstrated bulky celiac axis and gastrohepatic ligament lymphadenopathy, again similar to prior examination dated 04/30/2019 to the extent imaged. Musculoskeletal: No chest wall abnormality. No acute or significant osseous findings. Review of the MIP images confirms the above findings. IMPRESSION: 1.  Negative examination for pulmonary embolism. 2. Small bilateral pleural effusions with diffuse bilateral interlobular septal thickening and scattered ground-glass opacity throughout the lungs. These findings are consistent with infection or edema. 3. There are occasional small pulmonary nodules, which are new or enlarged when compared to prior CT. A nodule of the right pulmonary apex measures 7 mm, previously 3 mm when measured similarly (series 7, image 24). A new nodule of the right lower lobe measures 6 mm (series 7, image 84). These are most likely  infectious or inflammatory; pulmonary nodules are an unlikely manifestation of pulmonary involvement of lymphoma. 4. Numerous bulky bilateral axillary, lower cervical, mediastinal, and hilar lymph nodes, as well as bulky lymph nodes in the partially imaged upper abdomen, similar in size compared to CT dated 04/30/2019 and in keeping with diagnosis of CLL. Electronically Signed   By: Eddie Candle M.D.   On: 08/05/2019  15:26   CT ABDOMEN PELVIS W CONTRAST  Result Date: 08/06/2019 CLINICAL DATA:  CLL/SLL. EXAM: CT ABDOMEN AND PELVIS WITH CONTRAST TECHNIQUE: Multidetector CT imaging of the abdomen and pelvis was performed using the standard protocol following bolus administration of intravenous contrast. CONTRAST:  159m OMNIPAQUE IOHEXOL 300 MG/ML  SOLN COMPARISON:  04/30/2019 FINDINGS: Lower chest: There are moderate to large bilateral pleural effusions that are only partially visualized on this exam.The heart size is borderline enlarged. There are few ground-glass airspace opacities at the lung bases with interlobular septal thickening suggestive of volume overload/developing pulmonary edema. Hepatobiliary: The liver is normal. There is layering hyperdense material within the gallbladder lumen. This may represent gallbladder sludge or vicarious excretion of contrast from the patient's prior contrast enhanced CT.There is no biliary ductal dilation. Pancreas: Normal contours without ductal dilatation. No peripancreatic fluid collection. Spleen: The spleen is significantly enlarged Adrenals/Urinary Tract: --Adrenal glands: No adrenal hemorrhage. --Right kidney/ureter: No hydronephrosis or perinephric hematoma. --Left kidney/ureter: No hydronephrosis or perinephric hematoma. --Urinary bladder: Unremarkable. Stomach/Bowel: --Stomach/Duodenum: No hiatal hernia or other gastric abnormality. Normal duodenal course and caliber. --Small bowel: No dilatation or inflammation. --Colon: No focal abnormality. --Appendix: Normal. Vascular/Lymphatic: Atherosclerotic calcification is present within the non-aneurysmal abdominal aorta, without hemodynamically significant stenosis. --there is extensive retroperitoneal adenopathy --there is extensive mesenteric adenopathy --there is extensive pelvic and inguinal adenopathy. Overall, the adenopathy appears to be relatively stable to slightly improved when compared to prior study. Reproductive:  Unremarkable Other: No ascites or free air. The abdominal wall is normal. Musculoskeletal. 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 There is no acute displaced fracture. IMPRESSION: 1. Moderate to large bilateral pleural effusions with adjacent atelectasis. 2. Extensive adenopathy throughout the abdomen and pelvis, similar to slightly improved when compared to September 2020 CT. 3. Splenomegaly. 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 Aortic Atherosclerosis (ICD10-I70.0). Electronically Signed   By: CConstance HolsterM.D.   On: 08/06/2019 22:34   DG Chest Port 1 View  Result Date: 08/10/2019 CLINICAL DATA:  Respiratory failure. EXAM: PORTABLE CHEST 1 VIEW COMPARISON:  August 09, 2019. FINDINGS: Stable cardiomediastinal silhouette. Endotracheal and nasogastric tubes are unchanged in position. No pneumothorax or pleural effusion is noted. Stable bibasilar opacities are noted concerning for edema or possibly inflammation. Bony thorax is unremarkable. IMPRESSION: Stable support apparatus. Stable bibasilar opacities are noted concerning for edema or possibly inflammation. Electronically Signed   By: JMarijo ConceptionM.D.   On: 08/10/2019 10:29   DG Chest Port 1 View  Result Date: 08/09/2019 CLINICAL DATA:  Endotracheal tube EXAM: PORTABLE CHEST 1 VIEW COMPARISON:  08/08/2019 FINDINGS: No significant interval change in AP portable examination with endotracheal tube and esophagogastric tube in position. There remains mild, diffuse interstitial pulmonary opacity throughout, consistent with mild edema or infection. No new or focal airspace opacity. The heart and mediastinum are unremarkable. IMPRESSION: No significant interval change in diffuse interstitial opacity throughout both lungs, consistent with mild edema or infection. Electronically Signed   By: AEddie CandleM.D.   On: 08/09/2019 08:07   DG CHEST PORT  1 VIEW  Result Date: 08/08/2019 CLINICAL  DATA:  48 year old male status post cardiac arrest yesterday. History of CLL and suspected recent pneumonia. Negative for COVID-19. EXAM: PORTABLE CHEST 1 VIEW COMPARISON:  08/07/2019 and earlier. FINDINGS: Portable AP semi upright view at 0531 hours. Endotracheal tube tip in good position between the level the clavicles and carina. Enteric tube courses to the abdomen, tip not included. Course and asymmetric bilateral pulmonary interstitial opacity with regressed more confluent perihilar opacity since yesterday. The ventilation now resembles that on 08/05/2019. There is no longer confluent opacity at the left lateral lung base. No pneumothorax or pleural effusion. Paucity of bowel gas. No acute osseous abnormality identified. IMPRESSION: 1. Satisfactory lines and tubes. 2. Improved ventilation since yesterday with regressed perihilar opacity. Continued coarse interstitial opacity resembling that on 08/05/2019 which was also demonstrated by CTA at that time. 3. No new cardiopulmonary abnormality. Electronically Signed   By: Genevie Ann M.D.   On: 08/08/2019 10:48   DG CHEST PORT 1 VIEW  Result Date: 08/07/2019 CLINICAL DATA:  Tube placement, code EXAM: PORTABLE CHEST 1 VIEW COMPARISON:  06/05/2019 FINDINGS: Interval placement of endotracheal tube, tip projecting over the mid trachea. Esophagogastric tube, poorly visualized due to underpenetration although appears to be with tip and side port below the diaphragm. There is mild, diffuse interstitial pulmonary opacity. Layering pleural effusions seen on prior CT are not well appreciated. No new airspace opacity. IMPRESSION: 1. Interval placement of endotracheal tube, tip projecting over the mid trachea. 2. Esophagogastric tube, poorly visualized due to underpenetration although appears to be with tip and side port below the diaphragm. 3. There is mild, diffuse interstitial pulmonary opacity. Layering pleural effusions seen on prior CT are not well appreciated. No new  airspace opacity. Electronically Signed   By: Eddie Candle M.D.   On: 08/07/2019 14:10   DG Chest Port 1 View  Result Date: 08/05/2019 CLINICAL DATA:  PT c/o worsening SOB since last night. Pt states he is normally somewhat SOB at baseline. Hx HTN, diabetes, COPD, asthma, former smoker EXAM: PORTABLE CHEST - 1 VIEW COMPARISON:  08/02/2019 FINDINGS: Increasing patchy airspace infiltrate laterally at the left lung base. Diffuse interstitial markings throughout both lungs as before. Heart size normal. Right paratracheal soft tissue consistent with adenopathy. The aortopulmonary window is obscured suggesting adenopathy or mass. Blunting of left lateral costophrenic angle. No pneumothorax. Visualized bones unremarkable. IMPRESSION: 1. Increasing patchy airspace infiltrate laterally at the left lung base suspicious for pneumonia. 2. Probable mediastinal adenopathy. 3. Diffuse interstitial markings throughout both lungs as before. Electronically Signed   By: Lucrezia Europe M.D.   On: 08/05/2019 09:12   DG CHEST PORT 1 VIEW  Result Date: 08/01/2019 CLINICAL DATA:  48 year old male with weakness. Acute on chronic anemia, thrombocytopenia. Chronic lymphocytic leukemia. Negative for COVID-19. Yesterday. EXAM: PORTABLE CHEST 1 VIEW COMPARISON:  Chest radiographs 07/31/2019 and earlier. FINDINGS: Portable AP upright view at 1518 hours. Stable lung volumes and mediastinal contours. Mediastinal lymphadenopathy demonstrated by CT in September likely persists. Stable mild bilateral nonspecific pulmonary interstitial markings, slightly greater on the right. No superimposed pneumothorax, pleural effusion or consolidation. Visualized tracheal air column is within normal limits. Negative visible bowel gas pattern. No acute osseous abnormality identified. IMPRESSION: 1. Persistent nonspecific pulmonary interstitial markings greater on the right. Doubt pulmonary edema. Atypical infection remains possible. But this might be related to  the patient's CLL given suggestion of some nonspecific interstitial opacity by CT in September. 2. Evidence of mediastinal lymphadenopathy stable  since September. 3. No new cardiopulmonary abnormality since yesterday. Electronically Signed   By: Genevie Ann M.D.   On: 08/01/2019 16:59   DG Abd Portable 1V  Result Date: 08/09/2019 CLINICAL DATA:  OG tube placement EXAM: PORTABLE ABDOMEN - 1 VIEW COMPARISON:  Concurrent chest radiograph, CT abdomen pelvis 08/06/2019 FINDINGS: Tip and side port of the transesophageal tube or distal to the GE junction, terminating in the region of the gastric antrum/duodenal bulb. Bowel gas pattern is nonobstructive. No acute soft tissue abnormality is seen. Osseous structures are unremarkable. IMPRESSION: 1. Tip of the transesophageal tube in the region of the gastric antrum/duodenal bulb. 2. Nonobstructive bowel gas pattern. Electronically Signed   By: Lovena Le M.D.   On: 08/09/2019 14:41   ECHOCARDIOGRAM COMPLETE  Result Date: 08/08/2019   ECHOCARDIOGRAM REPORT   Patient Name:   Larry Lam Date of Exam: 08/08/2019 Medical Rec #:  [id number]            Height:       70.0 in Accession #:    [id number]           Weight:       223.3 lb Date of Birth:  Jan 05, 1971            BSA:          2.19 m Patient Age:    48 years             BP:           119/61 mmHg Patient Gender: M                    HR:           68 bpm. Exam Location:  Inpatient Procedure: 2D Echo Indications:    Cardiac arrest I46.9  History:        Patient has prior history of Echocardiogram examinations, most                 recent 04/28/2017. COPD, Signs/Symptoms:Hypotension; Risk                 Factors:Diabetes. Acute on chronic respiratory failure.  Sonographer:    Vikki Ports Turrentine Referring Phys: [id number] Francesca Jewett  Sonographer Comments: Technically difficult study due to poor echo windows and echo performed with patient supine and on artificial respirator. Image acquisition challenging due to  patient body habitus. IMPRESSIONS  1. Left ventricular ejection fraction, by visual estimation, is 55 to 60%. The left ventricle has normal function. There is no left ventricular hypertrophy.  2. Left ventricular diastolic parameters are consistent with Grade I diastolic dysfunction (impaired relaxation).  3. The left ventricle has no regional wall motion abnormalities.  4. Global right ventricle has mildly reduced systolic function.The right ventricular size is normal. No increase in right ventricular wall thickness.  5. Left atrial size was mildly dilated.  6. Right atrial size was normal.  7. Trivial pericardial effusion is present.  8. The mitral valve is grossly normal. Trivial mitral valve regurgitation.  9. The tricuspid valve is grossly normal. Tricuspid valve regurgitation is trivial. 10. The aortic valve is tricuspid. Aortic valve regurgitation is not visualized. 11. The pulmonic valve was grossly normal. Pulmonic valve regurgitation is trivial. 12. Moderately elevated pulmonary artery systolic pressure. 13. The inferior vena cava is dilated in size with <50% respiratory variability, suggesting right atrial pressure of 15 mmHg. FINDINGS  Left Ventricle: Left ventricular ejection fraction, by visual estimation, is 55  to 60%. The left ventricle has normal function. The left ventricle has no regional wall motion abnormalities. There is no left ventricular hypertrophy. Left ventricular diastolic parameters are consistent with Grade I diastolic dysfunction (impaired relaxation). Indeterminate filling pressures. Right Ventricle: The right ventricular size is normal. No increase in right ventricular wall thickness. Global RV systolic function is has mildly reduced systolic function. The tricuspid regurgitant velocity is 2.65 m/s, and with an assumed right atrial pressure of 15 mmHg, the estimated right ventricular systolic pressure is moderately elevated at 43.1 mmHg. Left Atrium: Left atrial size was mildly  dilated. Right Atrium: Right atrial size was normal in size Pericardium: Trivial pericardial effusion is present. Mitral Valve: The mitral valve is grossly normal. Trivial mitral valve regurgitation. Tricuspid Valve: The tricuspid valve is grossly normal. Tricuspid valve regurgitation is trivial. Aortic Valve: The aortic valve is tricuspid. Aortic valve regurgitation is not visualized. Pulmonic Valve: The pulmonic valve was grossly normal. Pulmonic valve regurgitation is trivial. Pulmonic regurgitation is trivial. Aorta: The aortic root and ascending aorta are structurally normal, with no evidence of dilitation. Venous: IVC assessment for right atrial pressure unable to be performed due to mechanical ventilation. The inferior vena cava is dilated in size with less than 50% respiratory variability, suggesting right atrial pressure of 15 mmHg. IAS/Shunts: No atrial level shunt detected by color flow Doppler.  LEFT VENTRICLE PLAX 2D LVIDd:         4.80 cm  Diastology LVIDs:         3.30 cm  LV e' lateral:   10.40 cm/s LV PW:         1.00 cm  LV E/e' lateral: 12.9 LV IVS:        1.00 cm  LV e' medial:    7.83 cm/s LVOT diam:     2.10 cm  LV E/e' medial:  17.1 LV SV:         63 ml LV SV Index:   27.96 LVOT Area:     3.46 cm  LEFT ATRIUM             Index       RIGHT ATRIUM           Index LA diam:        4.10 cm 1.87 cm/m  RA Area:     16.90 cm LA Vol (A2C):   44.2 ml 20.21 ml/m RA Volume:   45.60 ml  20.85 ml/m LA Vol (A4C):   79.0 ml 36.11 ml/m LA Biplane Vol: 62.3 ml 28.48 ml/m  AORTIC VALVE LVOT Vmax:   112.00 cm/s LVOT Vmean:  82.200 cm/s LVOT VTI:    0.215 m  AORTA Ao Root diam: 3.40 cm MITRAL VALVE                         TRICUSPID VALVE MV Area (PHT): 3.54 cm              TR Peak grad:   28.1 mmHg MV PHT:        62.06 msec            TR Vmax:        265.00 cm/s MV Decel Time: 214 msec MV E velocity: 134.00 cm/s 103 cm/s  SHUNTS MV A velocity: 115.00 cm/s 70.3 cm/s Systemic VTI:  0.22 m MV E/A ratio:  1.17         1.5       Systemic Diam:  2.10 cm  Lyman Bishop MD Electronically signed by Lyman Bishop MD Signature Date/Time: 08/08/2019/11:37:42 AM    Final     ASSESSMENT AND PLAN: 1. Progressive CLL -The patient has a history of lymphadenopathy since 2010. -He was diagnosed in August 2020 by flow cytometry, CD5 positive, CD23 positive lymphocytes consistent with CLL -CLL FISH panel showed ATM mutation. T p53 by FISH was negative. I GVH mutation positive. -He developed worsening anemia and thrombocytopenia along with constitutional symptoms including anorexia, weight loss, and bulky adenopathy. -Repeat flow cytometry on 08/03/2019 was again consistent with CLL, CD5 positive, CD23 positive. -CT angiogram of the chest on 08/05/2019 and CT of the abdomen pelvis with contrast on 08/06/2019 show continued widespread bulky adenopathy. -PET scan and bone marrow biopsy have been ordered previously, but not yet completed. -The patient was under consideration to begin treatment with venetoclax and obinutuzumab pending completion of work-up. -Order placed for bone marrow biopsy by interventional radiology to be performed in the next few days. -We will plan to begin a obinutuzumab today.  Adverse effects have been discussed with the patient including but not limited to risk of infusion reaction, leukopenia, thrombocytopenia, increased risk for infection.  The patient agrees to proceed. -The patient is at risk for tumor lysis syndrome.  Baseline uric acid 8.3 today.  He has been started on allopurinol 100 mg twice daily.  We will hold off on aggressive IV fluids as he is being diuresed.  Recheck labs in the morning.  2. Acute respiratory failure -abnormal CT of the chest?pneumonia vs. CLL involvement -Attempted to extubate earlier today, but the patient developed tachycardia, tachypnea, and increased work of breathing requiring reintubation -Ongoing management per PCCM.  3.  Anemia -Due to vitamin B12  deficiency and possible CLL infiltration of the bone marrow. -Vitamin B12 level was low at 168 on 08/06/2019. -Recommend vitamin B12 injections daily for 7 days, then weekly for 4 weeks, and then monthly -Recommend bone marrow biopsy as above. -Transfuse PRBCs per ICU parameters. -May also be immune-mediated. Recommend high dose steroids with Prednisone 1 mg/kg daily or equivalent. Will plan to taper after 1 week.   4.  Thrombocytopenia -Suspect this is due to bone marrow infiltration from CLL versus severe B12 deficiency. -Platelet count is 27,000 which is stable. -There is no active bleeding. -Recommend platelet transfusion for platelet count less than 20,000 or active bleeding.  -May also be immune-mediated. High dose steroids as above.    LOS: 5 days   Mikey Bussing, DNP, AGPCNP-BC, AOCNP 08/10/19

## 2019-08-10 NOTE — Progress Notes (Signed)
Initial Nutrition Assessment  DOCUMENTATION CODES:   Not applicable  INTERVENTION:   Tube Feeding:  Trickle TF of Vital High Protein at 20 ml/hr Goal: Vital High Protein at 60 ml/hr Pro-Stat 30 mL BID Provides 157 g of protein, 1640 kcals, 1210 mL of free water Meets 100% estimated protein, calorie needs  Recommend scheduled bowel regimen; given no documented BM x 10 days  NUTRITION DIAGNOSIS:   Inadequate oral intake related to acute illness as evidenced by NPO status.  GOAL:   Patient will meet greater than or equal to 90% of their needs  MONITOR:   Vent status, TF tolerance, Labs, Weight trends  REASON FOR ASSESSMENT:   Consult, Ventilator Enteral/tube feeding initiation and management  ASSESSMENT:   48 yo male admitted with pneumonia to [facility] on 12/10, subsequently had cardiac and respiratory arrest requiring intubation and transferred to [facility]. PMH includes recent dx of CLL/CML in 03/2019, COPD, DM, HTN, seizures  12/10 Admitted to [facility] with Pneumonia 12/12 PEA arrest, transfer to [facility] 12/14 Extubated, Re-intubated, Bronch, Oncology consulted  Patient is currently intubated on ventilator support, on precedex, off phenylephrine MV: 10.6 L/min Temp (24hrs), Avg:98.6 F (37 C), Min:98.2 F (36.8 C), Max:99 F (37.2 C)  Propofol: NONE  Noted plan for bone biopsy tentatively on 12/16  OG tube with tip in gastric antrum/duodenal bulb, Abdomen soft, distended. +flatus.  No BM since 12/05. Noted pt with PRN bowel regimen; recommend changing to scheduled bowel regimen until pt has BM  Labs: CBGs 179-216 Meds: Vitamin B-12, lasix, ss novolog, lantus, solumedrol  Diet Order:   Diet Order            Diet NPO time specified  Diet effective midnight        Diet NPO time specified  Diet effective now              EDUCATION NEEDS:   Not appropriate for education at this time  Skin:  Skin Assessment: Reviewed RN Assessment  Last BM:   12/5  Height:   Ht Readings from Last 1 Encounters:  08/07/19 5\' 10"  (1.778 m)    Weight:   Wt Readings from Last 1 Encounters:  08/07/19 101.3 kg    Ideal Body Weight:     BMI:  Body mass index is 32.04 kg/m.  Estimated Nutritional Needs:   Kcal:  1679 kcals  Protein:  150-200 g  Fluid:  >/= 1.7 L  Cate Blanche Gallien MS, RDN, LDN, CNSC [phone] Pager  [phone] Weekend/On-Call Pager

## 2019-08-11 ENCOUNTER — Inpatient Hospital Stay (HOSPITAL_COMMUNITY): Payer: Medicare Other

## 2019-08-11 DIAGNOSIS — J9602 Acute respiratory failure with hypercapnia: Secondary | ICD-10-CM

## 2019-08-11 DIAGNOSIS — J441 Chronic obstructive pulmonary disease with (acute) exacerbation: Secondary | ICD-10-CM

## 2019-08-11 LAB — GLUCOSE, CAPILLARY
Glucose-Capillary: 147 mg/dL — ABNORMAL HIGH (ref 70–99)
Glucose-Capillary: 164 mg/dL — ABNORMAL HIGH (ref 70–99)
Glucose-Capillary: 183 mg/dL — ABNORMAL HIGH (ref 70–99)
Glucose-Capillary: 191 mg/dL — ABNORMAL HIGH (ref 70–99)
Glucose-Capillary: 224 mg/dL — ABNORMAL HIGH (ref 70–99)
Glucose-Capillary: 255 mg/dL — ABNORMAL HIGH (ref 70–99)

## 2019-08-11 LAB — CULTURE, RESPIRATORY W GRAM STAIN

## 2019-08-11 LAB — COMPREHENSIVE METABOLIC PANEL
ALT: 14 U/L (ref 0–44)
AST: 20 U/L (ref 15–41)
Albumin: 2.6 g/dL — ABNORMAL LOW (ref 3.5–5.0)
Alkaline Phosphatase: 52 U/L (ref 38–126)
Anion gap: 13 (ref 5–15)
BUN: 72 mg/dL — ABNORMAL HIGH (ref 6–20)
CO2: 31 mmol/L (ref 22–32)
Calcium: 8.6 mg/dL — ABNORMAL LOW (ref 8.9–10.3)
Chloride: 96 mmol/L — ABNORMAL LOW (ref 98–111)
Creatinine, Ser: 1.26 mg/dL — ABNORMAL HIGH (ref 0.61–1.24)
GFR calc Af Amer: >60 mL/min (ref >60)
GFR calc non Af Amer: >60 mL/min (ref >60)
Glucose, Bld: 253 mg/dL — ABNORMAL HIGH (ref 70–99)
Potassium: 4.5 mmol/L (ref 3.5–5.1)
Sodium: 140 mmol/L (ref 135–145)
Total Bilirubin: 0.5 mg/dL (ref 0.3–1.2)
Total Protein: 6.4 g/dL — ABNORMAL LOW (ref 6.5–8.1)

## 2019-08-11 LAB — POCT I-STAT 7, (LYTES, BLD GAS, ICA,H+H)
Acid-Base Excess: 11 mmol/L — ABNORMAL HIGH (ref 0.0–2.0)
Acid-Base Excess: 14 mmol/L — ABNORMAL HIGH (ref 0.0–2.0)
Bicarbonate: 35.8 mmol/L — ABNORMAL HIGH (ref 20.0–28.0)
Bicarbonate: 37.8 mmol/L — ABNORMAL HIGH (ref 20.0–28.0)
Calcium, Ion: 1.13 mmol/L — ABNORMAL LOW (ref 1.15–1.40)
Calcium, Ion: 1.1 mmol/L — ABNORMAL LOW (ref 1.15–1.40)
HCT: 28 % — ABNORMAL LOW (ref 39.0–52.0)
HCT: 24 % — ABNORMAL LOW (ref 39.0–52.0)
Hemoglobin: 9.5 g/dL — ABNORMAL LOW (ref 13.0–17.0)
Hemoglobin: 8.2 g/dL — ABNORMAL LOW (ref 13.0–17.0)
O2 Saturation: 42 %
O2 Saturation: 99 %
Patient temperature: 98
Patient temperature: 98.2
Potassium: 4.2 mmol/L (ref 3.5–5.1)
Potassium: 4.4 mmol/L (ref 3.5–5.1)
Sodium: 138 mmol/L (ref 135–145)
Sodium: 139 mmol/L (ref 135–145)
TCO2: 37 mmol/L — ABNORMAL HIGH (ref 22–32)
TCO2: 39 mmol/L — ABNORMAL HIGH (ref 22–32)
pCO2 arterial: 50 mmHg — ABNORMAL HIGH (ref 32.0–48.0)
pCO2 arterial: 44.8 mmHg (ref 32.0–48.0)
pH, Arterial: 7.462 — ABNORMAL HIGH (ref 7.350–7.450)
pH, Arterial: 7.533 — ABNORMAL HIGH (ref 7.350–7.450)
pO2, Arterial: 22 mmHg — CL (ref 83.0–108.0)
pO2, Arterial: 139 mmHg — ABNORMAL HIGH (ref 83.0–108.0)

## 2019-08-11 LAB — CBC
HCT: 26.3 % — ABNORMAL LOW (ref 39.0–52.0)
Hemoglobin: 8.7 g/dL — ABNORMAL LOW (ref 13.0–17.0)
MCH: 29.5 pg (ref 26.0–34.0)
MCHC: 33.1 g/dL (ref 30.0–36.0)
MCV: 89.2 fL (ref 80.0–100.0)
Platelets: 20 10*3/uL — CL (ref 150–400)
RBC: 2.95 MIL/uL — ABNORMAL LOW (ref 4.22–5.81)
RDW: 17.1 % — ABNORMAL HIGH (ref 11.5–15.5)
WBC: 10.3 10*3/uL (ref 4.0–10.5)
nRBC: 0.7 % — ABNORMAL HIGH (ref 0.0–0.2)

## 2019-08-11 LAB — PROTIME-INR
INR: 1.2 (ref 0.8–1.2)
Prothrombin Time: 15.2 seconds (ref 11.4–15.2)

## 2019-08-11 LAB — PHOSPHORUS: Phosphorus: 5.7 mg/dL — ABNORMAL HIGH (ref 2.5–4.6)

## 2019-08-11 LAB — URIC ACID: Uric Acid, Serum: 10.6 mg/dL — ABNORMAL HIGH (ref 3.7–8.6)

## 2019-08-11 LAB — MAGNESIUM: Magnesium: 2.5 mg/dL — ABNORMAL HIGH (ref 1.7–2.4)

## 2019-08-11 MED ORDER — SULFAMETHOXAZOLE-TRIMETHOPRIM 800-160 MG PO TABS
1.0000 | ORAL_TABLET | Freq: Two times a day (BID) | ORAL | Status: DC
  Filled 2019-08-11: qty 1

## 2019-08-11 MED ORDER — ALLOPURINOL 300 MG PO TABS
300.0000 mg | ORAL_TABLET | Freq: Two times a day (BID) | ORAL | Status: DC
  Administered 2019-08-11 – 2019-08-13 (×5): 300 mg via ORAL
  Filled 2019-08-11 (×5): qty 1

## 2019-08-11 MED ORDER — INSULIN GLARGINE 100 UNIT/ML ~~LOC~~ SOLN
15.0000 [IU] | Freq: Every day | SUBCUTANEOUS | Status: DC
  Administered 2019-08-11 – 2019-08-12 (×2): 15 [IU] via SUBCUTANEOUS
  Filled 2019-08-11 (×5): qty 0.15

## 2019-08-11 MED ORDER — SULFAMETHOXAZOLE-TRIMETHOPRIM 800-160 MG PO TABS
2.0000 | ORAL_TABLET | Freq: Two times a day (BID) | ORAL | Status: DC
  Administered 2019-08-11: 2
  Filled 2019-08-11 (×2): qty 2

## 2019-08-11 MED ORDER — SULFAMETHOXAZOLE-TRIMETHOPRIM 800-160 MG PO TABS
2.0000 | ORAL_TABLET | Freq: Two times a day (BID) | ORAL | Status: DC
  Administered 2019-08-11 – 2019-08-13 (×4): 2 via ORAL
  Filled 2019-08-11 (×6): qty 2

## 2019-08-11 MED ORDER — INSULIN ASPART 100 UNIT/ML ~~LOC~~ SOLN
3.0000 [IU] | SUBCUTANEOUS | Status: DC
  Administered 2019-08-11 (×3): 6 [IU] via SUBCUTANEOUS
  Administered 2019-08-12: 3 [IU] via SUBCUTANEOUS
  Administered 2019-08-12 (×2): 9 [IU] via SUBCUTANEOUS
  Administered 2019-08-12: 3 [IU] via SUBCUTANEOUS
  Administered 2019-08-12: 09:00:00 6 [IU] via SUBCUTANEOUS

## 2019-08-11 MED ORDER — INSULIN GLARGINE 100 UNIT/ML ~~LOC~~ SOLN
5.0000 [IU] | Freq: Once | SUBCUTANEOUS | Status: AC
  Administered 2019-08-11: 5 [IU] via SUBCUTANEOUS
  Filled 2019-08-11: qty 0.05

## 2019-08-11 MED ORDER — INSULIN ASPART 100 UNIT/ML ~~LOC~~ SOLN
12.0000 [IU] | Freq: Once | SUBCUTANEOUS | Status: AC
  Administered 2019-08-11: 12 [IU] via SUBCUTANEOUS

## 2019-08-11 NOTE — Progress Notes (Signed)
[facility] Progress Note Patient Name: Larry Lam DOB: [birth year] MRN: [id number]   Date of Service  08/11/2019  HPI/Events of Note  Sub-optimal glycemic control on standard coverage scale, Pt is on iv solumedrol.  eICU Interventions  Coverage scale changed to resistant scale, and Lantus dose increased to 15 units Q HS with an extra dose of 5 units given this morning.        Kerry Kass Michol Emory 08/11/2019, 5:08 AM

## 2019-08-11 NOTE — Progress Notes (Addendum)
NAME:  Larry Lam, MRN:  [id number], DOB:  April 16, 1971, LOS: 6 ADMISSION DATE:  08/05/2019, CONSULTATION DATE: 08/07/2019 REFERRING MD: TRH, CHIEF COMPLAINT: Status post arrest  Brief History   48 y.o.malewith medical history significant forrecent diagnosis of CLL/CML in August 2020, acute on chronic respiratory failure on 2 L O2, COPD, seizures, diabetes mellitus, hypertension.  Admitted to [facility] on 12/10 with acute on chronic respiratory failure, pneumonia Had respiratory/cardiac arrest on 12/12 after he removed his nasal cannula.  Intubated and transferred to [facility].  Failed trial of extubation 12/14 requiring reintubation.  Questioning for progressive CLL +/- pneumonia given progressive chest and abdominal bulky adenopathy with anemia and thrombocytopenia.  Oncology consulted 12/14.    Past Medical History  Asthma, COPD, diabetes, hypertension, CHF, CLL/CML, seizures, HTN  Significant Hospital Events   12/10 Admitted to [facility]  12/12 PEA arrest secondary to hypoxemia, transfer to [facility] 12/14 failed extubation trial, reintubated/ bronch, oncology consulted, started on high dose steroids  Consults:  PCCM Oncology 12/14 IR 12/14 for bone marrow biopsy   Procedures:  ETT 12/12 > 12/14; 12/14 >> Femoral CVL 12/12 >  Significant Diagnostic Tests:  CTA 12/10-no pulmonary embolism, small effusion with scattered groundglass opacities.  Subcentimeter pulmonary nodule, bulky axillary, cervical, mediastinal and hilar lymph nodes.  CT abdomen 12/11-extensive adenopathy, splenomegaly, avascular necrosis of hips.  Micro Data:  12/10 Sars-CoV-2>>neg 12/10 Influenza A/B>>neg 12/13 tracheal aspirate >> mSSA, stenotrophomonas 12/14 BAL pneumocystis >> 12/14 BAL fungal >> 12/14 BAL cx >>  Antimicrobials:  Azithromycin 12/10 >> 12/14 Cefepime 12/10 >> 12/14 Vancomycin 12/10 >>12/11  Interim history/subjective:   Critically ill,  intubated Afebrile  Objective   Blood pressure 126/71, pulse (!) 58, temperature 98.2 F (36.8 C), temperature source Oral, resp. rate 14, height 5' 10"  (1.778 m), weight 101.9 kg, SpO2 100 %. CVP:  [10 mmHg-12 mmHg] 10 mmHg  Vent Mode: PSV;CPAP FiO2 (%):  [50 %] 50 % Set Rate:  [20 bmp] 20 bmp Vt Set:  [580 mL] 580 mL PEEP:  [5 cmH20] 5 cmH20 Pressure Support:  [8 cmH20] 8 cmH20 Plateau Pressure:  [11 cmH20-19 cmH20] 11 cmH20   Intake/Output Summary (Last 24 hours) at 08/11/2019 0905 Last data filed at 08/11/2019 0800 Gross per 24 hour  Intake 681.25 ml  Output 2377 ml  Net -1695.75 ml   Filed Weights   08/05/19 0837 08/07/19 0400 08/11/19 0410  Weight: 103 kg 101.3 kg 101.9 kg    Examination: General: Young adult man sitting upright in bed in NAD HEENT: Mild pallor, no icterus, ETT / OGT Neuro: Awake, interactive, nonfocal CV: rr, no murmur PULM: Clear bilateral, minimal secretions GI: obese, soft, bs active, NT, foley  Extremities: warm/dry, no LE edema  Skin: no rashes  Chest x-ray also 16 personally reviewed, ET tube in position, right lower lobe airspace disease/small effusion.  Labs show elevated glucose, stable creatinine, normal electrolytes, elevated bicarbonate, stable anemia  Resolved Hospital Problem list    Assessment & Plan:  PEA arrest- related to hypoxemia/ resp failure P:  Tele monitoring   Acute hypoxic/hypercarbic respiratory failure COPD- doubt flare Failed extubation trial 12/14 requiring reintubation  P:  Tolerating pressure support trial again 8/5 Chemotherapy about to be started, will extubate after DC Solu-Medrol was received for 48 hours Duonebs, pulmicort, prn albuterol  VAP bundle  Abnormal CT chest- question of pneumonia vs. Aspiration vs. CLL involvement, Pct neg.  He had similar GGO on top of emphysema on his CT  in September, wonder if all related to CLL with superimposed inflammatory effect vs. Edema. ? Previous diagnosis of  sarcoidosis  -Follow BAL results -Start Bactrim for stenotrophomonas and MSSA in respiratory culture   AKI, improving hypophos -resolved P:  Avoid nephrotoxins, follow   CLL Anemia Thrombocytopneia - recent dx in August 2020 with progressive anemia and thrombocytopenia and increasing adenopathy concerning for progressive disease  P:  Wife request transfer of oncology care to here from [place] assistance PICC to be placed, once placed can d/c femoral CVL Plan for obinutuzamab 12/15 & 12/16 -4-hour infusion Premedicated with Decadron IR consulted for bone biopsy  B12 SQ daily per oncology  Pending uric acid Transfuse for Plt < 20k; Hgb < 7  Hyperglcyemia - steroid induced P:  Add Lantus 15 units SSI -resistant scale while on steroids   Summary-plan for trial of extubation after he gets his chemotherapy infusion.  Unclear why he failed extubation on 12/14-has received steroids since then  Best practice:  Diet: start TF Pain/Anxiety/Delirium protocol (if indicated): precedex, prn fentanyl VAP protocol (if indicated): Yes DVT prophylaxis: SCDs, no heparin SQ given thrombocytopenia GI prophylaxis: PPI Glucose control: Lantus SSI Mobility: Bedrest Code Status: Full Family Communication: wife  Disposition:ICU   The patient is critically ill with multiple organ systems failure and requires high complexity decision making for assessment and support, frequent evaluation and titration of therapies, application of advanced monitoring technologies and extensive interpretation of multiple databases. Critical Care Time devoted to patient care services described in this note independent of APP/resident  time is 32 minutes.    Kara Mead MD. [facility] Flood. [facility]  If no response to pager , please call [phone]    08/11/2019, 9:05 AM

## 2019-08-11 NOTE — Progress Notes (Signed)
HEMATOLOGY-ONCOLOGY PROGRESS NOTE  SUBJECTIVE: More awake and alert. Wife at bedside. Remains on ventilator, but likely extubation later today. Tolerated obinutuzumab well with no infusion reaction noted. Indicates that he his hungry. Has no other complaints.   Oncology History  CLL (chronic lymphocytic leukemia) ([place])  04/21/2019 Initial Diagnosis   CLL (chronic lymphocytic leukemia) ([place])   08/10/2019 -  Chemotherapy   The patient had obinutuzumab (GAZYVA) 100 mg in sodium chloride 0.9 % 100 mL (0.9615 mg/mL) chemo infusion, 100 mg, Intravenous, Once, 1 of 6 cycles Administration: 100 mg (08/10/2019)  for chemotherapy treatment.      PHYSICAL EXAMINATION: ECOG PERFORMANCE STATUS: 3 - Symptomatic, >50% confined to bed  Vitals:   08/11/19 1200 08/11/19 1300  BP: 130/68 133/68  Pulse: (!) 55 (!) 56  Resp: 14 13  Temp: 98.4 F (36.9 C)   SpO2: 100% 100%   Filed Weights   08/05/19 0837 08/07/19 0400 08/11/19 0410  Weight: 227 lb 1.2 oz (103 kg) 223 lb 5.2 oz (101.3 kg) 224 lb 10.4 oz (101.9 kg)    Intake/Output from previous day: 12/15 0701 - 12/16 0700 In: 892.8 [I.V.:840.8; IV Piggyback:52] Out: 2178 [Urine:2178]  GENERAL: Chronically ill-appearing male, no distress, remains on ventilator SKIN: No rashes or petechiae. EYES: PERRL, conjunctivae pink OROPHARYNX: ET tube in place, no thrush or mucositis LYMPH: Palpable cervical, axillary, inguinal lymphadenopathy, right posterior neck LN softer.  LUNGS: clear to auscultation and percussion with normal breathing effort HEART: regular rate & rhythm and no murmurs and no lower extremity edema ABDOMEN:abdomen soft, non-tender and normal bowel sounds Musculoskeletal:no cyanosis of digits and no clubbing  NEURO: alert & oriented x 3  LABORATORY DATA:  I have reviewed the data as listed CMP Latest Ref Rng & Units 08/11/2019 08/11/2019 08/10/2019  Glucose 70 - 99 mg/dL - 253(H) 246(H)  BUN 6 - 20 mg/dL - 72(H) 65(H)   Creatinine 0.61 - 1.24 mg/dL - 1.26(H) 1.37(H)  Sodium 135 - 145 mmol/L 139 140 140  Potassium 3.5 - 5.1 mmol/L 4.4 4.5 4.8  Chloride 98 - 111 mmol/L - 96(L) 98  CO2 22 - 32 mmol/L - 31 29  Calcium 8.9 - 10.3 mg/dL - 8.6(L) 8.5(L)  Total Protein 6.5 - 8.1 g/dL - 6.4(L) -  Total Bilirubin 0.3 - 1.2 mg/dL - 0.5 -  Alkaline Phos 38 - 126 U/L - 52 -  AST 15 - 41 U/L - 20 -  ALT 0 - 44 U/L - 14 -    Lab Results  Component Value Date   WBC 10.3 08/11/2019   HGB 8.2 (L) 08/11/2019   HCT 24.0 (L) 08/11/2019   MCV 89.2 08/11/2019   PLT 20 (LL) 08/11/2019   NEUTROABS 1.5 (L) 08/10/2019    DG Chest 1 View  Result Date: 08/09/2019 CLINICAL DATA:  ETT placement EXAM: CHEST  1 VIEW COMPARISON:  Radiographs 08/09/2019 FINDINGS: Endotracheal tube in the mid trachea, 4.5 cm from the carina. Transesophageal tube tip beyond the GE junction curling in the left upper quadrant, terminating below the level of imaging. Diffuse interstitial pulmonary opacity throughout both lungs with few more patchy areas of opacity. Some right basilar pleural thickening compatible with fusion. Suspect at least trace left effusion as well. Cardiomediastinal contours are stable. No acute osseous or soft tissue abnormality. IMPRESSION: 1. Endotracheal tube 4.5 cm from the carina. 2. Transesophageal tube tip curls in the left upper quadrant, terminating below the level of imaging. 3. Diffuse interstitial airspace opacities compatible with infection  and/or edema. Overall similar in extent to comparison studies. Electronically Signed   By: Lovena Le M.D.   On: 08/09/2019 14:40   DG Chest 2 View  Result Date: 08/02/2019 CLINICAL DATA:  Shortness of breath, recent discharge, CLL EXAM: CHEST - 2 VIEW COMPARISON:  Chest CT 04/30/2019, chest radiograph 08/01/2019 FINDINGS: Diffusely increased interstitial markings are again seen. No pneumothorax. More nodular masslike opacity seen in the left costophrenic sulcus possibly reflecting  some prominent lymph nodes in the pericardial fat pad on comparison chest CT. Additional ovoid opacities are seen in the soft tissues of the axilla and base of the neck. Bilateral effusions are noted. Cardiomediastinal contours are similar to prior with nodular densities in the hila likely reflecting adenopathy. No acute osseous abnormality. Degenerative changes in the shoulders. IMPRESSION: Nonspecific increased interstitial markings with bilateral effusions. Could reflect atypical infection or edema. Nodular opacity in the left costophrenic sulcus may reflect adenopathy within the pericardial fat. Additional nodular opacities in the hila and axilla likely reflecting further adenopathy in the setting of CLL. Electronically Signed   By: Lovena Le M.D.   On: 08/02/2019 22:13   DG Chest 2 View  Result Date: 07/31/2019 CLINICAL DATA:  Shortness of breath. EXAM: CHEST - 2 VIEW COMPARISON:  April 27, 2017 FINDINGS: Cardiomediastinal silhouette is stable. No pneumothorax. No nodules or masses. Increased lung markings bilaterally with interstitial components. There is a small left pleural effusion. IMPRESSION: 1. Increase lung markings centrally with interstitial components suggestive of atypical infection/pneumonia. Bronchitis is a possibility. There is a small associated left effusion. Electronically Signed   By: Dorise Bullion III M.D   On: 07/31/2019 02:34   CT Angio Chest PE W/Cm &/Or Wo Cm  Result Date: 08/05/2019 CLINICAL DATA:  Shortness of breath, CLL EXAM: CT ANGIOGRAPHY CHEST WITH CONTRAST TECHNIQUE: Multidetector CT imaging of the chest was performed using the standard protocol during bolus administration of intravenous contrast. Multiplanar CT image reconstructions and MIPs were obtained to evaluate the vascular anatomy. CONTRAST:  174m OMNIPAQUE IOHEXOL 350 MG/ML SOLN COMPARISON:  CT chest abdomen pelvis, 04/30/2011 FINDINGS: Cardiovascular: Satisfactory opacification of the pulmonary  arteries to the segmental level. No evidence of pulmonary embolism. Normal heart size. No pericardial effusion. Mediastinum/Nodes: Numerous bulky bilateral axillary, lower cervical, mediastinal, and hilar lymph nodes, similar in size compared to CT dated 04/30/2019. Thyroid gland, trachea, and esophagus demonstrate no significant findings. Lungs/Pleura: Small bilateral pleural effusions. Diffuse bilateral interlobular septal thickening and scattered ground-glass opacities throughout the lungs. There are occasional small pulmonary nodules, which are new or enlarged when compared to prior CT. A nodule of the right pulmonary apex measures 7 mm, previously 3 mm when measured similarly (series 7, image 24). A new nodule of the right lower lobe measures 6 mm (series 7, image 84). Upper Abdomen: No acute abnormality. There is redemonstrated bulky celiac axis and gastrohepatic ligament lymphadenopathy, again similar to prior examination dated 04/30/2019 to the extent imaged. Musculoskeletal: No chest wall abnormality. No acute or significant osseous findings. Review of the MIP images confirms the above findings. IMPRESSION: 1.  Negative examination for pulmonary embolism. 2. Small bilateral pleural effusions with diffuse bilateral interlobular septal thickening and scattered ground-glass opacity throughout the lungs. These findings are consistent with infection or edema. 3. There are occasional small pulmonary nodules, which are new or enlarged when compared to prior CT. A nodule of the right pulmonary apex measures 7 mm, previously 3 mm when measured similarly (series 7, image 24). A new nodule  of the right lower lobe measures 6 mm (series 7, image 84). These are most likely infectious or inflammatory; pulmonary nodules are an unlikely manifestation of pulmonary involvement of lymphoma. 4. Numerous bulky bilateral axillary, lower cervical, mediastinal, and hilar lymph nodes, as well as bulky lymph nodes in the partially  imaged upper abdomen, similar in size compared to CT dated 04/30/2019 and in keeping with diagnosis of CLL. Electronically Signed   By: Eddie Candle M.D.   On: 08/05/2019 15:26   CT ABDOMEN PELVIS W CONTRAST  Result Date: 08/06/2019 CLINICAL DATA:  CLL/SLL. EXAM: CT ABDOMEN AND PELVIS WITH CONTRAST TECHNIQUE: Multidetector CT imaging of the abdomen and pelvis was performed using the standard protocol following bolus administration of intravenous contrast. CONTRAST:  175m OMNIPAQUE IOHEXOL 300 MG/ML  SOLN COMPARISON:  04/30/2019 FINDINGS: Lower chest: There are moderate to large bilateral pleural effusions that are only partially visualized on this exam.The heart size is borderline enlarged. There are few ground-glass airspace opacities at the lung bases with interlobular septal thickening suggestive of volume overload/developing pulmonary edema. Hepatobiliary: The liver is normal. There is layering hyperdense material within the gallbladder lumen. This may represent gallbladder sludge or vicarious excretion of contrast from the patient's prior contrast enhanced CT.There is no biliary ductal dilation. Pancreas: Normal contours without ductal dilatation. No peripancreatic fluid collection. Spleen: The spleen is significantly enlarged Adrenals/Urinary Tract: --Adrenal glands: No adrenal hemorrhage. --Right kidney/ureter: No hydronephrosis or perinephric hematoma. --Left kidney/ureter: No hydronephrosis or perinephric hematoma. --Urinary bladder: Unremarkable. Stomach/Bowel: --Stomach/Duodenum: No hiatal hernia or other gastric abnormality. Normal duodenal course and caliber. --Small bowel: No dilatation or inflammation. --Colon: No focal abnormality. --Appendix: Normal. Vascular/Lymphatic: Atherosclerotic calcification is present within the non-aneurysmal abdominal aorta, without hemodynamically significant stenosis. --there is extensive retroperitoneal adenopathy --there is extensive mesenteric adenopathy  --there is extensive pelvic and inguinal adenopathy. Overall, the adenopathy appears to be relatively stable to slightly improved when compared to prior study. Reproductive: Unremarkable Other: No ascites or free air. The abdominal wall is normal. Musculoskeletal. 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 There is no acute displaced fracture. IMPRESSION: 1. Moderate to large bilateral pleural effusions with adjacent atelectasis. 2. Extensive adenopathy throughout the abdomen and pelvis, similar to slightly improved when compared to September 2020 CT. 3. Splenomegaly. 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 Aortic Atherosclerosis (ICD10-I70.0). Electronically Signed   By: CConstance HolsterM.D.   On: 08/06/2019 22:34   DG Chest Port 1 View  Result Date: 08/11/2019 CLINICAL DATA:  ETT, respiratory failure EXAM: PORTABLE CHEST 1 VIEW COMPARISON:  08/10/2019 FINDINGS: Interval increase in heterogeneous opacity of the right lung base and a probable layering pleural effusion. Otherwise unchanged mild, diffuse interstitial opacity. Endotracheal tube and esophagogastric tube are unchanged. IMPRESSION: 1. Interval increase in heterogeneous opacity of the right lung base and a probable layering pleural effusion. 2. Otherwise unchanged mild, diffuse interstitial opacity, in keeping with infection or edema. 3.  Endotracheal tube and esophagogastric tube are unchanged. Electronically Signed   By: AEddie CandleM.D.   On: 08/11/2019 09:00   DG Chest Port 1 View  Result Date: 08/10/2019 CLINICAL DATA:  Respiratory failure. EXAM: PORTABLE CHEST 1 VIEW COMPARISON:  August 09, 2019. FINDINGS: Stable cardiomediastinal silhouette. Endotracheal and nasogastric tubes are unchanged in position. No pneumothorax or pleural effusion is noted. Stable bibasilar opacities are noted concerning for edema or possibly inflammation. Bony thorax is unremarkable. IMPRESSION: Stable  support apparatus. Stable bibasilar opacities are  noted concerning for edema or possibly inflammation. Electronically Signed   By: Marijo Conception M.D.   On: 08/10/2019 10:29   DG Chest Port 1 View  Result Date: 08/09/2019 CLINICAL DATA:  Endotracheal tube EXAM: PORTABLE CHEST 1 VIEW COMPARISON:  08/08/2019 FINDINGS: No significant interval change in AP portable examination with endotracheal tube and esophagogastric tube in position. There remains mild, diffuse interstitial pulmonary opacity throughout, consistent with mild edema or infection. No new or focal airspace opacity. The heart and mediastinum are unremarkable. IMPRESSION: No significant interval change in diffuse interstitial opacity throughout both lungs, consistent with mild edema or infection. Electronically Signed   By: Eddie Candle M.D.   On: 08/09/2019 08:07   DG CHEST PORT 1 VIEW  Result Date: 08/08/2019 CLINICAL DATA:  48 year old male status post cardiac arrest yesterday. History of CLL and suspected recent pneumonia. Negative for COVID-19. EXAM: PORTABLE CHEST 1 VIEW COMPARISON:  08/07/2019 and earlier. FINDINGS: Portable AP semi upright view at 0531 hours. Endotracheal tube tip in good position between the level the clavicles and carina. Enteric tube courses to the abdomen, tip not included. Course and asymmetric bilateral pulmonary interstitial opacity with regressed more confluent perihilar opacity since yesterday. The ventilation now resembles that on 08/05/2019. There is no longer confluent opacity at the left lateral lung base. No pneumothorax or pleural effusion. Paucity of bowel gas. No acute osseous abnormality identified. IMPRESSION: 1. Satisfactory lines and tubes. 2. Improved ventilation since yesterday with regressed perihilar opacity. Continued coarse interstitial opacity resembling that on 08/05/2019 which was also demonstrated by CTA at that time. 3. No new cardiopulmonary abnormality. Electronically Signed   By: Genevie Ann  M.D.   On: 08/08/2019 10:48   DG CHEST PORT 1 VIEW  Result Date: 08/07/2019 CLINICAL DATA:  Tube placement, code EXAM: PORTABLE CHEST 1 VIEW COMPARISON:  06/05/2019 FINDINGS: Interval placement of endotracheal tube, tip projecting over the mid trachea. Esophagogastric tube, poorly visualized due to underpenetration although appears to be with tip and side port below the diaphragm. There is mild, diffuse interstitial pulmonary opacity. Layering pleural effusions seen on prior CT are not well appreciated. No new airspace opacity. IMPRESSION: 1. Interval placement of endotracheal tube, tip projecting over the mid trachea. 2. Esophagogastric tube, poorly visualized due to underpenetration although appears to be with tip and side port below the diaphragm. 3. There is mild, diffuse interstitial pulmonary opacity. Layering pleural effusions seen on prior CT are not well appreciated. No new airspace opacity. Electronically Signed   By: Eddie Candle M.D.   On: 08/07/2019 14:10   DG Chest Port 1 View  Result Date: 08/05/2019 CLINICAL DATA:  PT c/o worsening SOB since last night. Pt states he is normally somewhat SOB at baseline. Hx HTN, diabetes, COPD, asthma, former smoker EXAM: PORTABLE CHEST - 1 VIEW COMPARISON:  08/02/2019 FINDINGS: Increasing patchy airspace infiltrate laterally at the left lung base. Diffuse interstitial markings throughout both lungs as before. Heart size normal. Right paratracheal soft tissue consistent with adenopathy. The aortopulmonary window is obscured suggesting adenopathy or mass. Blunting of left lateral costophrenic angle. No pneumothorax. Visualized bones unremarkable. IMPRESSION: 1. Increasing patchy airspace infiltrate laterally at the left lung base suspicious for pneumonia. 2. Probable mediastinal adenopathy. 3. Diffuse interstitial markings throughout both lungs as before. Electronically Signed   By: Lucrezia Europe M.D.   On: 08/05/2019 09:12   DG CHEST PORT 1 VIEW  Result  Date: 08/01/2019 CLINICAL DATA:  48 year old male with weakness. Acute on chronic  anemia, thrombocytopenia. Chronic lymphocytic leukemia. Negative for COVID-19. Yesterday. EXAM: PORTABLE CHEST 1 VIEW COMPARISON:  Chest radiographs 07/31/2019 and earlier. FINDINGS: Portable AP upright view at 1518 hours. Stable lung volumes and mediastinal contours. Mediastinal lymphadenopathy demonstrated by CT in September likely persists. Stable mild bilateral nonspecific pulmonary interstitial markings, slightly greater on the right. No superimposed pneumothorax, pleural effusion or consolidation. Visualized tracheal air column is within normal limits. Negative visible bowel gas pattern. No acute osseous abnormality identified. IMPRESSION: 1. Persistent nonspecific pulmonary interstitial markings greater on the right. Doubt pulmonary edema. Atypical infection remains possible. But this might be related to the patient's CLL given suggestion of some nonspecific interstitial opacity by CT in September. 2. Evidence of mediastinal lymphadenopathy stable since September. 3. No new cardiopulmonary abnormality since yesterday. Electronically Signed   By: Genevie Ann M.D.   On: 08/01/2019 16:59   DG Abd Portable 1V  Result Date: 08/09/2019 CLINICAL DATA:  OG tube placement EXAM: PORTABLE ABDOMEN - 1 VIEW COMPARISON:  Concurrent chest radiograph, CT abdomen pelvis 08/06/2019 FINDINGS: Tip and side port of the transesophageal tube or distal to the GE junction, terminating in the region of the gastric antrum/duodenal bulb. Bowel gas pattern is nonobstructive. No acute soft tissue abnormality is seen. Osseous structures are unremarkable. IMPRESSION: 1. Tip of the transesophageal tube in the region of the gastric antrum/duodenal bulb. 2. Nonobstructive bowel gas pattern. Electronically Signed   By: Lovena Le M.D.   On: 08/09/2019 14:41   ECHOCARDIOGRAM COMPLETE  Result Date: 08/08/2019   ECHOCARDIOGRAM REPORT   Patient Name:   Cooper  LAIN TETTERTON Date of Exam: 08/08/2019 Medical Rec #:  [id number]            Height:       70.0 in Accession #:    [id number]           Weight:       223.3 lb Date of Birth:  [birth year]            BSA:          2.19 m Patient Age:    48 years             BP:           119/61 mmHg Patient Gender: M                    HR:           68 bpm. Exam Location:  Inpatient Procedure: 2D Echo Indications:    Cardiac arrest I46.9  History:        Patient has prior history of Echocardiogram examinations, most                 recent 04/28/2017. COPD, Signs/Symptoms:Hypotension; Risk                 Factors:Diabetes. Acute on chronic respiratory failure.  Sonographer:    Vikki Ports Turrentine Referring Phys: [id number] Francesca Jewett  Sonographer Comments: Technically difficult study due to poor echo windows and echo performed with patient supine and on artificial respirator. Image acquisition challenging due to patient body habitus. IMPRESSIONS  1. Left ventricular ejection fraction, by visual estimation, is 55 to 60%. The left ventricle has normal function. There is no left ventricular hypertrophy.  2. Left ventricular diastolic parameters are consistent with Grade I diastolic dysfunction (impaired relaxation).  3. The left ventricle has no regional wall motion abnormalities.  4. Global right ventricle has mildly reduced  systolic function.The right ventricular size is normal. No increase in right ventricular wall thickness.  5. Left atrial size was mildly dilated.  6. Right atrial size was normal.  7. Trivial pericardial effusion is present.  8. The mitral valve is grossly normal. Trivial mitral valve regurgitation.  9. The tricuspid valve is grossly normal. Tricuspid valve regurgitation is trivial. 10. The aortic valve is tricuspid. Aortic valve regurgitation is not visualized. 11. The pulmonic valve was grossly normal. Pulmonic valve regurgitation is trivial. 12. Moderately elevated pulmonary artery systolic pressure. 13. The inferior  vena cava is dilated in size with <50% respiratory variability, suggesting right atrial pressure of 15 mmHg. FINDINGS  Left Ventricle: Left ventricular ejection fraction, by visual estimation, is 55 to 60%. The left ventricle has normal function. The left ventricle has no regional wall motion abnormalities. There is no left ventricular hypertrophy. Left ventricular diastolic parameters are consistent with Grade I diastolic dysfunction (impaired relaxation). Indeterminate filling pressures. Right Ventricle: The right ventricular size is normal. No increase in right ventricular wall thickness. Global RV systolic function is has mildly reduced systolic function. The tricuspid regurgitant velocity is 2.65 m/s, and with an assumed right atrial pressure of 15 mmHg, the estimated right ventricular systolic pressure is moderately elevated at 43.1 mmHg. Left Atrium: Left atrial size was mildly dilated. Right Atrium: Right atrial size was normal in size Pericardium: Trivial pericardial effusion is present. Mitral Valve: The mitral valve is grossly normal. Trivial mitral valve regurgitation. Tricuspid Valve: The tricuspid valve is grossly normal. Tricuspid valve regurgitation is trivial. Aortic Valve: The aortic valve is tricuspid. Aortic valve regurgitation is not visualized. Pulmonic Valve: The pulmonic valve was grossly normal. Pulmonic valve regurgitation is trivial. Pulmonic regurgitation is trivial. Aorta: The aortic root and ascending aorta are structurally normal, with no evidence of dilitation. Venous: IVC assessment for right atrial pressure unable to be performed due to mechanical ventilation. The inferior vena cava is dilated in size with less than 50% respiratory variability, suggesting right atrial pressure of 15 mmHg. IAS/Shunts: No atrial level shunt detected by color flow Doppler.  LEFT VENTRICLE PLAX 2D LVIDd:         4.80 cm  Diastology LVIDs:         3.30 cm  LV e' lateral:   10.40 cm/s LV PW:         1.00  cm  LV E/e' lateral: 12.9 LV IVS:        1.00 cm  LV e' medial:    7.83 cm/s LVOT diam:     2.10 cm  LV E/e' medial:  17.1 LV SV:         63 ml LV SV Index:   27.96 LVOT Area:     3.46 cm  LEFT ATRIUM             Index       RIGHT ATRIUM           Index LA diam:        4.10 cm 1.87 cm/m  RA Area:     16.90 cm LA Vol (A2C):   44.2 ml 20.21 ml/m RA Volume:   45.60 ml  20.85 ml/m LA Vol (A4C):   79.0 ml 36.11 ml/m LA Biplane Vol: 62.3 ml 28.48 ml/m  AORTIC VALVE LVOT Vmax:   112.00 cm/s LVOT Vmean:  82.200 cm/s LVOT VTI:    0.215 m  AORTA Ao Root diam: 3.40 cm MITRAL VALVE  TRICUSPID VALVE MV Area (PHT): 3.54 cm              TR Peak grad:   28.1 mmHg MV PHT:        62.06 msec            TR Vmax:        265.00 cm/s MV Decel Time: 214 msec MV E velocity: 134.00 cm/s 103 cm/s  SHUNTS MV A velocity: 115.00 cm/s 70.3 cm/s Systemic VTI:  0.22 m MV E/A ratio:  1.17        1.5       Systemic Diam: 2.10 cm  Lyman Bishop MD Electronically signed by Lyman Bishop MD Signature Date/Time: 08/08/2019/11:37:42 AM    Final     ASSESSMENT AND PLAN: 1. Progressive CLL -The patient has a history of lymphadenopathy since 2010. -He was diagnosed in August 2020 by flow cytometry, CD5 positive, CD23 positive lymphocytes consistent with CLL -CLL FISH panel showed ATM mutation. T p53 by FISH was negative. I GVH mutation positive. -He developed worsening anemia and thrombocytopenia along with constitutional symptoms including anorexia, weight loss, and bulky adenopathy. -Repeat flow cytometry on 08/03/2019 was again consistent with CLL, CD5 positive, CD23 positive. -CT angiogram of the chest on 08/05/2019 and CT of the abdomen pelvis with contrast on 08/06/2019 show continued widespread bulky adenopathy. -PET scan and bone marrow biopsy have been ordered previously, but not yet completed. -The patient was under consideration to begin treatment with venetoclax and obinutuzumab pending completion of  work-up. -Order placed for bone marrow biopsy by interventional radiology to be performed in the next 1-2 days when off ventilator.  -Received first dose of obinutuzumab and tolerated well.  -The patient is at risk for tumor lysis syndrome. He was started on allopurinol 100 mg twice daily, but uric acid elevated today. Dose of allopurinol has been increased to 300 mg BID. We will hold off on aggressive IV fluids as he is being diuresed.  Recheck labs in the morning. May need to consider administration of rasburicase if uric acid continues to rise.   2. Acute respiratory failure -abnormal CT of the chest?pneumonia vs. CLL involvement -Attempted to extubate earlier today, but the patient developed tachycardia, tachypnea, and increased work of breathing requiring reintubation -Ongoing management per PCCM.  3.  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 -Vitamin B12 level was low at 168 on 08/06/2019. -Recommend vitamin B12 injections daily for 7 days, then weekly for 4 weeks, and then monthly -Recommend bone marrow biopsy as above. -Transfuse PRBCs per ICU parameters. -May also be immune-mediated. Recommend high dose steroids with Prednisone 1 mg/kg daily or equivalent. Will plan to taper after 1 week.   4.  Thrombocytopenia -Suspect this is due to bone marrow infiltration from CLL versus severe B12 deficiency. -Platelet count is 20,000 which is down slightly. -There is no active bleeding. -Recommend platelet transfusion for platelet count less than 20,000 or active bleeding.  -May also be immune-mediated. High dose steroids as above.    LOS: 6 days   Mikey Bussing, DNP, AGPCNP-BC, AOCNP 08/11/19

## 2019-08-11 NOTE — Progress Notes (Signed)
[facility] Progress Note Patient Name: [name] Plaskett DOB: 07/31/1971 MRN: [id number]   Date of Service  08/11/2019  HPI/Events of Note  Hyperglycemia - Blood glucose = 255.  eICU Interventions  Will order Novolog 12 units [place] X 1 now.      Intervention Category Major Interventions: Hyperglycemia - active titration of insulin therapy  Lysle Dingwall 08/11/2019, 7:46 PM

## 2019-08-11 NOTE — Evaluation (Signed)
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  Clinical Impression  Pt presents to PT with deficits in balance, power, and cardiopulmonary function. Pt recently extubated and saturating well on 2L [place] during mobility. Pt demonstrates increased sway during ambulation and reports feeling mildly unsteady. Pt is able to perform all mobility at a supervision level currently and will benefit from continued PT POC to aide in a return to independence. Pt is encouraged to ambulate outside of room at least 3 times daily with assistance of staff.    Follow Up Recommendations No PT follow up    Equipment Recommendations  None recommended by PT    Recommendations for Other Services       Precautions / Restrictions Precautions Precautions: Fall Precaution Comments: thrombocytopenia Restrictions Weight Bearing Restrictions: No      Mobility  Bed Mobility Overal bed mobility: Needs Assistance Bed Mobility: Supine to Sit     Supine to sit: Supervision;HOB elevated        Transfers Overall transfer level: Needs assistance Equipment used: None Transfers: Sit to/from Stand Sit to Stand: Supervision            Ambulation/Gait Ambulation/Gait assistance: Supervision Gait Distance (Feet): 80 Feet Assistive device: None Gait Pattern/deviations: Drifts right/left Gait velocity: reduced Gait velocity interpretation: 1.31 - 2.62 ft/sec, indicative of limited community ambulator General Gait Details: pt with slight increase in lateral sway, reporting he feels mildly unsteady  Stairs            Wheelchair Mobility    Modified Rankin (Stroke Patients Only)       Balance  Overall balance assessment: Mild deficits observed, not formally tested                                           Pertinent Vitals/Pain Pain Assessment: Faces Faces Pain Scale: Hurts little more Pain Location: L rib cage Pain Descriptors / Indicators: Aching Pain Intervention(s): Limited activity within patient's tolerance    Home Living Family/patient expects to be discharged to:: Private residence Living Arrangements: Spouse/significant other;Children Available Help at Discharge: Family;Available 24 hours/day Type of Home: House Home Access: Stairs to enter Entrance Stairs-Rails: None Entrance Stairs-Number of Steps: 1 Home Layout: One level Home Equipment: Environmental consultant - 2 wheels      Prior Function Level of Independence: Independent               Hand Dominance        Extremity/Trunk Assessment   Upper Extremity Assessment Upper Extremity Assessment: Overall WFL for tasks assessed    Lower Extremity Assessment Lower Extremity Assessment: Overall WFL for tasks assessed    Cervical / Trunk Assessment Cervical / Trunk Assessment: Normal  Communication   Communication: No difficulties  Cognition Arousal/Alertness: Awake/alert Behavior During Therapy: WFL for tasks assessed/performed Overall Cognitive Status: Within Functional Limits for tasks assessed                                        General Comments General comments (skin integrity, edema, etc.): Pt on 2L [place],  SpO2 ranging from 87%-100% on 2L [place] during mobility. Pt denies SOB    Exercises     Assessment/Plan    PT Assessment Patient needs continued PT services  PT Problem List Decreased activity tolerance;Decreased balance;Decreased mobility;Cardiopulmonary status limiting activity       PT Treatment Interventions DME instruction;Gait training;Stair training;Functional mobility training;Therapeutic activities;Therapeutic exercise;Balance training;Neuromuscular  re-education;Patient/family education    PT Goals (Current goals can be found in the Care Plan section)  Acute Rehab PT Goals Patient Stated Goal: To return home PT Goal Formulation: With patient Time For Goal Achievement: 08/25/19 Potential to Achieve Goals: Good    Frequency Min 3X/week   Barriers to discharge        Co-evaluation               AM-PAC PT "6 Clicks" Mobility  Outcome Measure Help needed turning from your back to your side while in a flat bed without using bedrails?: None Help needed moving from lying on your back to sitting on the side of a flat bed without using bedrails?: None Help needed moving to and from a bed to a chair (including a wheelchair)?: None Help needed standing up from a chair using your arms (e.g., wheelchair or bedside chair)?: None Help needed to walk in hospital room?: None Help needed climbing 3-5 steps with a railing? : A Little 6 Click Score: 23    End of Session Equipment Utilized During Treatment: Oxygen Activity Tolerance: Patient tolerated treatment well Patient left: in chair;with call bell/phone within reach;with chair alarm set Nurse Communication: Mobility status PT Visit Diagnosis: Unsteadiness on feet (R26.81)    Time: 1700-1720 PT Time Calculation (min) (ACUTE ONLY): 20 min   Charges:   PT Evaluation $PT Eval Moderate Complexity: 1 Mod          Zenaida Niece, PT, DPT Acute Rehabilitation Pager: [phone]   Zenaida Niece 08/11/2019, 5:32 PM

## 2019-08-11 NOTE — Procedures (Signed)
Extubation Procedure Note  Patient Details:   Name: Larry Lam DOB: July 23, 1971 MRN: [id number]   Airway Documentation:    Vent end date: 08/11/19 Vent end time: 1500   Evaluation  O2 sats: stable throughout Complications: No apparent complications Patient did tolerate procedure well. Bilateral Breath Sounds: Clear, Diminished   Yes, pt able to speak.  No distress noted, no stridor noted.  VSS, sat 100% on 4 lpm [place].  Lenna Sciara 08/11/2019, 3:25 PM

## 2019-08-12 ENCOUNTER — Inpatient Hospital Stay (HOSPITAL_COMMUNITY): Payer: Medicare Other

## 2019-08-12 LAB — COMPREHENSIVE METABOLIC PANEL
ALT: 14 U/L (ref 0–44)
AST: 14 U/L — ABNORMAL LOW (ref 15–41)
Albumin: 2.9 g/dL — ABNORMAL LOW (ref 3.5–5.0)
Alkaline Phosphatase: 56 U/L (ref 38–126)
Anion gap: 14 (ref 5–15)
BUN: 70 mg/dL — ABNORMAL HIGH (ref 6–20)
CO2: 30 mmol/L (ref 22–32)
Calcium: 8.8 mg/dL — ABNORMAL LOW (ref 8.9–10.3)
Chloride: 96 mmol/L — ABNORMAL LOW (ref 98–111)
Creatinine, Ser: 1.45 mg/dL — ABNORMAL HIGH (ref 0.61–1.24)
GFR calc Af Amer: >60 mL/min (ref >60)
GFR calc non Af Amer: 56 mL/min — ABNORMAL LOW (ref >60)
Glucose, Bld: 144 mg/dL — ABNORMAL HIGH (ref 70–99)
Potassium: 4.7 mmol/L (ref 3.5–5.1)
Sodium: 140 mmol/L (ref 135–145)
Total Bilirubin: 0.5 mg/dL (ref 0.3–1.2)
Total Protein: 6.6 g/dL (ref 6.5–8.1)

## 2019-08-12 LAB — CBC WITH DIFFERENTIAL/PLATELET
Abs Immature Granulocytes: 0.06 10*3/uL (ref 0.00–0.07)
Basophils Absolute: 0 10*3/uL (ref 0.0–0.1)
Basophils Relative: 0 %
Eosinophils Absolute: 0 10*3/uL (ref 0.0–0.5)
Eosinophils Relative: 0 %
HCT: 26.8 % — ABNORMAL LOW (ref 39.0–52.0)
Hemoglobin: 8.6 g/dL — ABNORMAL LOW (ref 13.0–17.0)
Immature Granulocytes: 0 %
Lymphocytes Relative: 87 %
Lymphs Abs: 18.1 10*3/uL — ABNORMAL HIGH (ref 0.7–4.0)
MCH: 29.1 pg (ref 26.0–34.0)
MCHC: 32.1 g/dL (ref 30.0–36.0)
MCV: 90.5 fL (ref 80.0–100.0)
Monocytes Absolute: 0.1 10*3/uL (ref 0.1–1.0)
Monocytes Relative: 1 %
Neutro Abs: 2.5 10*3/uL (ref 1.7–7.7)
Neutrophils Relative %: 12 %
Platelets: 21 10*3/uL — CL (ref 150–400)
RBC: 2.96 MIL/uL — ABNORMAL LOW (ref 4.22–5.81)
RDW: 16.8 % — ABNORMAL HIGH (ref 11.5–15.5)
WBC: 20.9 10*3/uL — ABNORMAL HIGH (ref 4.0–10.5)
nRBC: 0.5 % — ABNORMAL HIGH (ref 0.0–0.2)

## 2019-08-12 LAB — CULTURE, RESPIRATORY W GRAM STAIN

## 2019-08-12 LAB — PHOSPHORUS: Phosphorus: 5.4 mg/dL — ABNORMAL HIGH (ref 2.5–4.6)

## 2019-08-12 LAB — GLUCOSE, CAPILLARY
Glucose-Capillary: 127 mg/dL — ABNORMAL HIGH (ref 70–99)
Glucose-Capillary: 180 mg/dL — ABNORMAL HIGH (ref 70–99)
Glucose-Capillary: 215 mg/dL — ABNORMAL HIGH (ref 70–99)
Glucose-Capillary: 229 mg/dL — ABNORMAL HIGH (ref 70–99)
Glucose-Capillary: 326 mg/dL — ABNORMAL HIGH (ref 70–99)

## 2019-08-12 LAB — URIC ACID: Uric Acid, Serum: 7.8 mg/dL (ref 3.7–8.6)

## 2019-08-12 LAB — PATHOLOGIST SMEAR REVIEW

## 2019-08-12 MED ORDER — INSULIN ASPART 100 UNIT/ML ~~LOC~~ SOLN
0.0000 [IU] | Freq: Three times a day (TID) | SUBCUTANEOUS | Status: DC
  Administered 2019-08-13: 4 [IU] via SUBCUTANEOUS
  Administered 2019-08-13: 07:00:00 7 [IU] via SUBCUTANEOUS

## 2019-08-12 MED ORDER — INSULIN ASPART 100 UNIT/ML ~~LOC~~ SOLN
0.0000 [IU] | Freq: Every day | SUBCUTANEOUS | Status: DC
  Administered 2019-08-12: 4 [IU] via SUBCUTANEOUS

## 2019-08-12 MED ORDER — ARFORMOTEROL TARTRATE 15 MCG/2ML IN NEBU
15.0000 ug | INHALATION_SOLUTION | Freq: Two times a day (BID) | RESPIRATORY_TRACT | Status: DC
  Administered 2019-08-12 – 2019-08-13 (×3): 15 ug via RESPIRATORY_TRACT
  Filled 2019-08-12 (×2): qty 2

## 2019-08-12 MED ORDER — ESCITALOPRAM OXALATE 10 MG PO TABS
20.0000 mg | ORAL_TABLET | Freq: Every day | ORAL | Status: DC
  Administered 2019-08-12 – 2019-08-13 (×2): 20 mg via ORAL
  Filled 2019-08-12 (×2): qty 2

## 2019-08-12 MED ORDER — PREDNISONE 20 MG PO TABS
40.0000 mg | ORAL_TABLET | Freq: Two times a day (BID) | ORAL | Status: DC
  Administered 2019-08-12 – 2019-08-13 (×3): 40 mg via ORAL
  Filled 2019-08-12 (×3): qty 2

## 2019-08-12 MED ORDER — ENSURE ENLIVE PO LIQD
237.0000 mL | Freq: Two times a day (BID) | ORAL | Status: DC
  Administered 2019-08-12 – 2019-08-13 (×2): 237 mL via ORAL

## 2019-08-12 NOTE — Progress Notes (Signed)
Spoke with radiology ahead of scheduled biopsy this morning.  Patient has not been NPO since midnight (previous order for NPO had been DC'd).  Procedure will be rescheduled and patient will need to be NPO at midnight.

## 2019-08-12 NOTE — Progress Notes (Signed)
   Was scheduled for Bone marrow bx today Pt did have fluids overnight per RN  Now npo after MN and scheduled for 12/18  We will call for pt in am

## 2019-08-12 NOTE — Progress Notes (Signed)
NAME:  Larry Lam, MRN:  [id number], DOB:  1971/02/05, LOS: 7 ADMISSION DATE:  08/05/2019, CONSULTATION DATE: 08/07/2019 REFERRING MD: TRH, CHIEF COMPLAINT: Status post arrest  Brief History   48 y.o.malewith medical history significant forrecent diagnosis of CLL/CML in August 2020, acute on chronic respiratory failure on 2 L O2, COPD, seizures, diabetes mellitus, hypertension.  Admitted to [facility] on 12/10 with acute on chronic respiratory failure, pneumonia Had respiratory/cardiac arrest on 12/12 after he removed his nasal cannula.  Intubated and transferred to [facility].  Failed trial of extubation 12/14 requiring reintubation.  Questioning for progressive CLL +/- pneumonia given progressive chest and abdominal bulky adenopathy with anemia and thrombocytopenia.  Oncology consulted 12/14.    Past Medical History  Asthma, COPD, diabetes, hypertension, CHF, CLL/CML, seizures, HTN  Significant Hospital Events   12/10 Admitted to [facility]  12/12 PEA arrest secondary to hypoxemia, transfer to [facility] 12/14 failed extubation trial, reintubated/ bronch, oncology consulted, started on high dose steroids  Consults:  PCCM Oncology 12/14 IR 12/14 for bone marrow biopsy   Procedures:  ETT 12/12 > 12/14; 12/14 >> Femoral CVL 12/12 >  Significant Diagnostic Tests:  CTA 12/10-no pulmonary embolism, small effusion with scattered groundglass opacities.  Subcentimeter pulmonary nodule, bulky axillary, cervical, mediastinal and hilar lymph nodes.  CT abdomen 12/11-extensive adenopathy, splenomegaly, avascular necrosis of hips.  Micro Data:  12/10 Sars-CoV-2>>neg 12/10 Influenza A/B>>neg 12/13 tracheal aspirate >> mSSA, stenotrophomonas both pan sensitive 12/14 BAL pneumocystis >> 12/14 BAL fungal >> 12/14 BAL cx >> Stenotrophomonas pan sens  Antimicrobials:  Azithromycin 12/10 >> 12/14 Cefepime 12/10 >> 12/14 Vancomycin 12/10 >>12/11 Bactrim 12/16 >  Interim  history/subjective:  Extubated yesterday and tolerating well. Bone marrow biopsy on hold due to not NPO. Rescheduled. Patient with no complaints.   Objective   Blood pressure (!) 149/68, pulse 91, temperature 98.4 F (36.9 C), temperature source Oral, resp. rate 19, height _0  (1.778 m), weight 99.7 kg, SpO2 91 %.    Vent Mode: PSV;CPAP FiO2 (%):  [4 %-50 %] 4 % PEEP:  [5 cmH20] 5 cmH20 Pressure Support:  [5 cmH20] 5 cmH20   Intake/Output Summary (Last 24 hours) at 08/12/2019 0955 Last data filed at 08/12/2019 0800 Gross per 24 hour  Intake 600.27 ml  Output 1900 ml  Net -1299.73 ml   Filed Weights   08/07/19 0400 08/11/19 0410 08/12/19 0600  Weight: 101.3 kg 101.9 kg 99.7 kg    Examination:  General: Middle aged male in NAD HEENT: [place]/AT, No appreciable JVD Neuro: Awake, interactive, nonfocal CV: RRR, on MRG PULM: Clear, unlabored.  GI: Soft, non-tender, non-distended Extremities: warm/dry, no LE edema  Skin: Multiple tattoos   Labs personally reviewed   Resolved Hospital Problem list    Assessment & Plan:  PEA arrest- related to hypoxemia/ resp failure P:  Tele monitoring   Acute hypoxic/hypercarbic respiratory failure COPD- doubt flare Failed extubation trial 12/14 requiring reintubation, but was successfully extubated 12/16 P:  Supplemental O2 to keep O2 sats > 90% Duonebs, pulmicort, prn albuterol  Incentive spirometry Prednisone  Abnormal CT chest- question of pneumonia vs. Aspiration vs. CLL involvement, Pct neg.  He had similar GGO on top of emphysema on his CT in September, wonder if all related to CLL with superimposed inflammatory effect vs. Edema. ? Previous diagnosis of sarcoidosis Stenotrophomonas and MSSA pneumonia -Follow BAL results/fungal/pneumocystis  -Bactrim for stenotrophomonas and MSSA in respiratory culture  AKI, improving hypophos -resolved P:  Avoid nephrotoxins, follow  CLL Anemia Thrombocytopneia - recent dx in August 2020  with progressive anemia and thrombocytopenia and increasing adenopathy concerning for progressive disease  P:  Wife request transfer of oncology care to here from [place] assistance Midline placed [place] 12/15 & 12/16 Premedicated with Decadron IR consulted for bone biopsy, which will be done 12/18 NPO after midnight B12 SQ daily per oncology  Pending uric acid Transfuse for Plt < 20k; Hgb < 7  Hyperglcyemia - steroid induced P:  Lantus 15 units SSI -resistant   Transfer to Tele, [place] for 12/18   Best practice:  Diet: House  Pain/Anxiety/Delirium protocol (if indicated):NA VAP protocol (if indicated): Yes DVT prophylaxis: SCDs, no heparin SQ given thrombocytopenia GI prophylaxis: PPI Glucose control: Lantus SSI Mobility: Bedrest Code Status: Full Family Communication: patient updated.  Disposition:ICU   Georgann Housekeeper, AGACNP-BC [place]  See Amion for personal pager PCCM on call pager [phone]  08/12/2019 10:14 AM

## 2019-08-12 NOTE — Progress Notes (Signed)
Nutrition Follow-up  DOCUMENTATION CODES:   Not applicable  INTERVENTION:   Ensure Enlive po BID, each supplement provides 350 kcal and 20 grams of protein   NUTRITION DIAGNOSIS:   Inadequate oral intake related to acute illness as evidenced by NPO status.  Being addressed via diet advancement, supplements  GOAL:   Patient will meet greater than or equal to 90% of their needs  Progressing  MONITOR:   Vent status, TF tolerance, Labs, Weight trends  REASON FOR ASSESSMENT:   Consult, Ventilator Enteral/tube feeding initiation and management  ASSESSMENT:   48 yo male admitted with pneumonia to [facility] on 12/10, subsequently had cardiac and respiratory arrest requiring intubation and transferred to [facility]. PMH includes recent dx of CLL/CML in 03/2019, COPD, DM, HTN, seizures  12/10 Admitted to [facility] with Pneumonia 12/12 PEA arrest, transfer to [facility] 12/14 Extubated, Re-intubated, Bronch, Oncology consulted 12/16 Extubated 12/17 Diet advanced to [place] for Bone Biopsy on 12/18  TF ordered on 12/15, Pt did not receive TF due to pump shortage on 12/15 and then was extubated on 12/16  Diet advanced to Heart Healthy today. No recorded po intake. Tolerated CL diet yesterday    Labs: Creatinine 1.45, BUN 70, phosphorus 5.4 Meds: prednisone, ss novolog, levemir  Diet Order:   Diet Order            Diet NPO time specified  Diet effective midnight        Diet NPO time specified Except for: Sips with Meds  Diet effective midnight        Diet Heart Room service appropriate? Yes; Fluid consistency: Thin  Diet effective now              EDUCATION NEEDS:   Not appropriate for education at this time  Skin:  Skin Assessment: Reviewed RN Assessment  Last BM:  12/16  Height:   Ht Readings from Last 1 Encounters:  08/07/19 5\' 10"  (1.778 m)    Weight:   Wt Readings from Last 1 Encounters:  08/12/19 99.7 kg    Ideal Body Weight:   74.5 kg  BMI:  Body mass index is 31.54 kg/m.  Estimated Nutritional Needs:   Kcal:  2100-2300 kcals  Protein:  105-120 g  Fluid:  >/= 2 L   Cate Anmol Paschen MS, RDN, LDN, CNSC [phone] Pager  [phone] Weekend/On-Call Pager

## 2019-08-12 NOTE — Progress Notes (Signed)
This patient will be received in transfer from PCCM today. The patient was admitted to [facility] on 08/05/2019 for for respiratory failure due to PNA and COPD. The patient has chronic hypoxic respiratory failure requiring 2L O2 by nasal cannula. However, he required intubation and mechanical ventilation on 08/07/2019 due to cardiopulmonary arrest after he removed his nasal cannula. He was transferred to ICU at [facility]. An attempt was made to extubate the patient on 08/09/2019 which failed, and the patient had to be reintubated. He was ultimately extubated on 08/11/2019. PCCM transferred the patient to the floor and to the service of [place] on 08/12/2019.  Oncology has been consulted for the patient's progressive CLL with bulky adenopathy and anemia. Oncology has started chemotherapy and steroids on 12/15 and 16. There was a plan for BMBx on 08/12/2019, but the patient was not NPO after midnight. Now the procedure will be done on 08/13/2019. He is receiving Bactrim for MSSA and Stenotrophamonas grown out of sputum.   Past medical history significant for Asthma, COPD, DM II, Hypertension, CHG, CLL/CML, seizures, and hypertension.

## 2019-08-13 ENCOUNTER — Inpatient Hospital Stay (HOSPITAL_COMMUNITY): Payer: Medicare Other

## 2019-08-13 ENCOUNTER — Other Ambulatory Visit: Payer: Self-pay | Admitting: Oncology

## 2019-08-13 DIAGNOSIS — C9110 Chronic lymphocytic leukemia of B-cell type not having achieved remission: Secondary | ICD-10-CM

## 2019-08-13 DIAGNOSIS — D519 Vitamin B12 deficiency anemia, unspecified: Secondary | ICD-10-CM

## 2019-08-13 LAB — BASIC METABOLIC PANEL
Anion gap: 13 (ref 5–15)
BUN: 56 mg/dL — ABNORMAL HIGH (ref 6–20)
CO2: 27 mmol/L (ref 22–32)
Calcium: 8.9 mg/dL (ref 8.9–10.3)
Chloride: 96 mmol/L — ABNORMAL LOW (ref 98–111)
Creatinine, Ser: 1.55 mg/dL — ABNORMAL HIGH (ref 0.61–1.24)
GFR calc Af Amer: >60 mL/min (ref >60)
GFR calc non Af Amer: 52 mL/min — ABNORMAL LOW (ref >60)
Glucose, Bld: 249 mg/dL — ABNORMAL HIGH (ref 70–99)
Potassium: 4.8 mmol/L (ref 3.5–5.1)
Sodium: 136 mmol/L (ref 135–145)

## 2019-08-13 LAB — MAGNESIUM: Magnesium: 2.4 mg/dL (ref 1.7–2.4)

## 2019-08-13 LAB — PHOSPHORUS: Phosphorus: 3.9 mg/dL (ref 2.5–4.6)

## 2019-08-13 LAB — MISC LABCORP TEST (SEND OUT): Labcorp test code: 502251

## 2019-08-13 LAB — GLUCOSE, CAPILLARY
Glucose-Capillary: 226 mg/dL — ABNORMAL HIGH (ref 70–99)
Glucose-Capillary: 167 mg/dL — ABNORMAL HIGH (ref 70–99)

## 2019-08-13 LAB — URIC ACID: Uric Acid, Serum: 4.7 mg/dL (ref 3.7–8.6)

## 2019-08-13 MED ORDER — INSULIN GLARGINE 100 UNIT/ML ~~LOC~~ SOLN
25.0000 [IU] | Freq: Every day | SUBCUTANEOUS | Status: DC
  Filled 2019-08-13: qty 0.25

## 2019-08-13 MED ORDER — PREDNISONE 20 MG PO TABS: 100.0000 mg | ORAL_TABLET | Freq: Every day | ORAL | 0 refills | Status: AC

## 2019-08-13 MED ORDER — MIDAZOLAM HCL 2 MG/2ML IJ SOLN
INTRAMUSCULAR | Status: AC | PRN
  Administered 2019-08-13 (×3): 0.5 mg via INTRAVENOUS

## 2019-08-13 MED ORDER — INSULIN DETEMIR 100 UNIT/ML FLEXPEN: 30.0000 [IU] | PEN_INJECTOR | Freq: Every day | SUBCUTANEOUS | 0 refills | Status: AC

## 2019-08-13 MED ORDER — PANTOPRAZOLE SODIUM 40 MG PO TBEC: 40.0000 mg | DELAYED_RELEASE_TABLET | Freq: Every day | ORAL | 11 refills | Status: AC

## 2019-08-13 MED ORDER — MIDAZOLAM HCL 2 MG/2ML IJ SOLN
INTRAMUSCULAR | Status: AC
  Filled 2019-08-13: qty 2

## 2019-08-13 MED ORDER — FENTANYL CITRATE (PF) 100 MCG/2ML IJ SOLN
INTRAMUSCULAR | Status: AC | PRN
  Administered 2019-08-13 (×2): 25 ug via INTRAVENOUS

## 2019-08-13 MED ORDER — ALLOPURINOL 100 MG PO TABS: 100.0000 mg | ORAL_TABLET | Freq: Two times a day (BID) | ORAL | Status: DC

## 2019-08-13 MED ORDER — ALLOPURINOL 100 MG PO TABS: 100.0000 mg | ORAL_TABLET | Freq: Two times a day (BID) | ORAL | 0 refills | Status: DC

## 2019-08-13 MED ORDER — SULFAMETHOXAZOLE-TRIMETHOPRIM 800-160 MG PO TABS: 2.0000 | ORAL_TABLET | Freq: Two times a day (BID) | ORAL | 0 refills | Status: AC

## 2019-08-13 MED ORDER — "PEN NEEDLES 3/16"" 31G X 5 MM MISC": 0 refills | Status: AC

## 2019-08-13 MED ORDER — FENTANYL CITRATE (PF) 100 MCG/2ML IJ SOLN
INTRAMUSCULAR | Status: AC
  Filled 2019-08-13: qty 2

## 2019-08-13 NOTE — Progress Notes (Signed)
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    PT Comments    Pt tolerated treatment well although tachycardia noted during ambulation. Tachycardia likely related to deconditioning as pt reports he has not been ambulating much since pandemic began. Pt continues to demonstrate mild balance deficits but does not require UE support to correct. Pt will benefit from continued acute PT services to improve balance, gait quality, and activity tolerance.   Follow Up Recommendations  No PT follow up     Equipment Recommendations  None recommended by PT    Recommendations for Other Services       Precautions / Restrictions Precautions Precautions: Fall Precaution Comments: thrombocytopenia Restrictions Weight Bearing Restrictions: No    Mobility  Bed Mobility Overal bed mobility: (pt received and left in recliner)                Transfers Overall transfer level: Independent Equipment used: None Transfers: Sit to/from Stand Sit to Stand: Independent            Ambulation/Gait Ambulation/Gait assistance: Supervision Gait Distance (Feet): 250 Feet Assistive device: None Gait Pattern/deviations: Drifts right/left Gait velocity: functional Gait velocity interpretation: 1.31 - 2.62 ft/sec, indicative of limited community ambulator General Gait Details: pt with increased lateral sway and intermittent drifting left/right, one minor LOB which pt self-corrects for with stepping strategy   Stairs             Wheelchair Mobility    Modified Rankin (Stroke Patients Only)       Balance Overall balance assessment: Mild deficits observed, not formally tested                                           Cognition Arousal/Alertness: Awake/alert Behavior During Therapy: WFL for tasks assessed/performed Overall Cognitive Status: Within Functional Limits for tasks assessed                                        Exercises      General Comments General comments (skin integrity, edema, etc.): pt on 2L [place], saturating at 90% with mobility, able to talk throughout mobility with only mild increase in work of breathing. Pt tachy up to 140 during ambulation, likely 2/2 deconditioning      Pertinent Vitals/Pain Pain Assessment: 0-10 Pain Score: 6  Pain Location: ribs Pain Descriptors / Indicators: Aching Pain Intervention(s): Limited activity within patient's tolerance    Home Living                      Prior Function            PT Goals (current goals can now be found in the care plan section) Acute Rehab PT Goals Patient Stated Goal: To return home Progress towards PT goals: Progressing toward goals    Frequency    Min 3X/week      PT Plan Current plan remains appropriate    Co-evaluation              AM-PAC PT "6 Clicks" Mobility  Outcome Measure  Help needed turning from your back to your side while in a flat bed without using bedrails?: None Help needed moving from lying on your back to sitting on the side of a flat bed without using bedrails?: None Help needed moving to and from a bed to a chair (including a wheelchair)?: None Help needed standing up from a chair using your arms (e.g., wheelchair or bedside chair)?: None Help needed to walk in hospital room?: None Help needed climbing 3-5 steps with a railing? : A Little 6 Click Score: 23    End of Session Equipment Utilized During Treatment: Oxygen Activity Tolerance: Patient tolerated treatment well Patient left: in chair;with call bell/phone within reach Nurse Communication: Mobility status PT Visit Diagnosis: Unsteadiness on  feet (R26.81)     Time: [id number] PT Time Calculation (min) (ACUTE ONLY): 12 min  Charges:  $Gait Training: 8-22 mins                     Zenaida Niece, PT, DPT Acute Rehabilitation Pager: [phone]    Zenaida Niece 08/13/2019, 1:18 PM

## 2019-08-13 NOTE — Progress Notes (Signed)
HEMATOLOGY-ONCOLOGY PROGRESS NOTE  SUBJECTIVE: The patient was ambulating in the hallway with physical therapy at the time of my arrival.  He had minimal shortness of breath with exertion. He denies nausea and vomiting. Having some difficulty sleeping.  Bone marrow biopsy performed earlier today and tolerated the procedure well.  He is hoping to go home later today.  Oncology History  CLL (chronic lymphocytic leukemia) (HCC)  04/21/2019 Initial Diagnosis   CLL (chronic lymphocytic leukemia) (HCC)   08/10/2019 -  Chemotherapy   The patient had obinutuzumab (GAZYVA) 100 mg in sodium chloride 0.9 % 100 mL (0.9615 mg/mL) chemo infusion, 100 mg, Intravenous, Once, 1 of 6 cycles Administration: 100 mg (08/10/2019)  for chemotherapy treatment.      PHYSICAL EXAMINATION: ECOG PERFORMANCE STATUS: 3 - Symptomatic, >50% confined to bed  Vitals:   08/13/19 1128 08/13/19 1138  BP:  138/79  Pulse: (!) 114 (!) 118  Resp:  17  Temp:    SpO2:  93%   Filed Weights   08/12/19 0600 08/12/19 1644 08/13/19 0218  Weight: 219 lb 12.8 oz (99.7 kg) 212 lb 14.4 oz (96.6 kg) 212 lb 3.2 oz (96.3 kg)    Intake/Output from previous day: 12/17 0701 - 12/18 0700 In: 480 [P.O.:480] Out: 1725 [Urine:1725]  GENERAL: Chronically ill-appearing male, no distress SKIN: No rashes or petechiae. EYES: PERRL, conjunctivae pink OROPHARYNX: No thrush or mucositis LYMPH: Palpable cervical, axillary, inguinal lymphadenopathy, right posterior neck LN softer.  LUNGS: clear to auscultation and percussion with normal breathing effort HEART: Tachycardic, no murmurs and no lower extremity edema ABDOMEN:abdomen soft, non-tender and normal bowel sounds Musculoskeletal:no cyanosis of digits and no clubbing  NEURO: alert & oriented x 3  LABORATORY DATA:  I have reviewed the data as listed CMP Latest Ref Rng & Units 08/13/2019 08/12/2019 08/11/2019  Glucose 70 - 99 mg/dL 249(H) 144(H) -  BUN 6 - 20 mg/dL 56(H) 70(H) -   Creatinine 0.61 - 1.24 mg/dL 1.55(H) 1.45(H) -  Sodium 135 - 145 mmol/L 136 140 139  Potassium 3.5 - 5.1 mmol/L 4.8 4.7 4.4  Chloride 98 - 111 mmol/L 96(L) 96(L) -  CO2 22 - 32 mmol/L 27 30 -  Calcium 8.9 - 10.3 mg/dL 8.9 8.8(L) -  Total Protein 6.5 - 8.1 g/dL - 6.6 -  Total Bilirubin 0.3 - 1.2 mg/dL - 0.5 -  Alkaline Phos 38 - 126 U/L - 56 -  AST 15 - 41 U/L - 14(L) -  ALT 0 - 44 U/L - 14 -    Lab Results  Component Value Date   WBC 17.5 (H) 08/13/2019   HGB 9.1 (L) 08/13/2019   HCT 28.4 (L) 08/13/2019   MCV 90.7 08/13/2019   PLT 26 (LL) 08/13/2019   NEUTROABS 2.5 08/13/2019    DG Chest 1 View  Result Date: 08/09/2019 CLINICAL DATA:  ETT placement EXAM: CHEST  1 VIEW COMPARISON:  Radiographs 08/09/2019 FINDINGS: Endotracheal tube in the mid trachea, 4.5 cm from the carina. Transesophageal tube tip beyond the GE junction curling in the left upper quadrant, terminating below the level of imaging. Diffuse interstitial pulmonary opacity throughout both lungs with few more patchy areas of opacity. Some right basilar pleural thickening compatible with fusion. Suspect at least trace left effusion as well. Cardiomediastinal contours are stable. No acute osseous or soft tissue abnormality. IMPRESSION: 1. Endotracheal tube 4.5 cm from the carina. 2. Transesophageal tube tip curls in the left upper quadrant, terminating below the level of imaging. 3. Diffuse   interstitial airspace opacities compatible with infection and/or edema. Overall similar in extent to comparison studies. Electronically Signed   By: Price  DeHay M.D.   On: 08/09/2019 14:40   DG Chest 2 View  Result Date: 08/02/2019 CLINICAL DATA:  Shortness of breath, recent discharge, CLL EXAM: CHEST - 2 VIEW COMPARISON:  Chest CT 04/30/2019, chest radiograph 08/01/2019 FINDINGS: Diffusely increased interstitial markings are again seen. No pneumothorax. More nodular masslike opacity seen in the left costophrenic sulcus possibly reflecting  some prominent lymph nodes in the pericardial fat pad on comparison chest CT. Additional ovoid opacities are seen in the soft tissues of the axilla and base of the neck. Bilateral effusions are noted. Cardiomediastinal contours are similar to prior with nodular densities in the hila likely reflecting adenopathy. No acute osseous abnormality. Degenerative changes in the shoulders. IMPRESSION: Nonspecific increased interstitial markings with bilateral effusions. Could reflect atypical infection or edema. Nodular opacity in the left costophrenic sulcus may reflect adenopathy within the pericardial fat. Additional nodular opacities in the hila and axilla likely reflecting further adenopathy in the setting of CLL. Electronically Signed   By: Price  DeHay M.D.   On: 08/02/2019 22:13   DG Chest 2 View  Result Date: 07/31/2019 CLINICAL DATA:  Shortness of breath. EXAM: CHEST - 2 VIEW COMPARISON:  April 27, 2017 FINDINGS: Cardiomediastinal silhouette is stable. No pneumothorax. No nodules or masses. Increased lung markings bilaterally with interstitial components. There is a small left pleural effusion. IMPRESSION: 1. Increase lung markings centrally with interstitial components suggestive of atypical infection/pneumonia. Bronchitis is a possibility. There is a small associated left effusion. Electronically Signed   By: David  Williams III M.D   On: 07/31/2019 02:34   CT Angio Chest PE W/Cm &/Or Wo Cm  Result Date: 08/05/2019 CLINICAL DATA:  Shortness of breath, CLL EXAM: CT ANGIOGRAPHY CHEST WITH CONTRAST TECHNIQUE: Multidetector CT imaging of the chest was performed using the standard protocol during bolus administration of intravenous contrast. Multiplanar CT image reconstructions and MIPs were obtained to evaluate the vascular anatomy. CONTRAST:  100mL OMNIPAQUE IOHEXOL 350 MG/ML SOLN COMPARISON:  CT chest abdomen pelvis, 04/30/2011 FINDINGS: Cardiovascular: Satisfactory opacification of the pulmonary  arteries to the segmental level. No evidence of pulmonary embolism. Normal heart size. No pericardial effusion. Mediastinum/Nodes: Numerous bulky bilateral axillary, lower cervical, mediastinal, and hilar lymph nodes, similar in size compared to CT dated 04/30/2019. Thyroid gland, trachea, and esophagus demonstrate no significant findings. Lungs/Pleura: Small bilateral pleural effusions. Diffuse bilateral interlobular septal thickening and scattered ground-glass opacities throughout the lungs. There are occasional small pulmonary nodules, which are new or enlarged when compared to prior CT. A nodule of the right pulmonary apex measures 7 mm, previously 3 mm when measured similarly (series 7, image 24). A new nodule of the right lower lobe measures 6 mm (series 7, image 84). Upper Abdomen: No acute abnormality. There is redemonstrated bulky celiac axis and gastrohepatic ligament lymphadenopathy, again similar to prior examination dated 04/30/2019 to the extent imaged. Musculoskeletal: No chest wall abnormality. No acute or significant osseous findings. Review of the MIP images confirms the above findings. IMPRESSION: 1.  Negative examination for pulmonary embolism. 2. Small bilateral pleural effusions with diffuse bilateral interlobular septal thickening and scattered ground-glass opacity throughout the lungs. These findings are consistent with infection or edema. 3. There are occasional small pulmonary nodules, which are new or enlarged when compared to prior CT. A nodule of the right pulmonary apex measures 7 mm, previously 3 mm when measured similarly (series   7, image 24). A new nodule of the right lower lobe measures 6 mm (series 7, image 84). These are most likely infectious or inflammatory; pulmonary nodules are an unlikely manifestation of pulmonary involvement of lymphoma. 4. Numerous bulky bilateral axillary, lower cervical, mediastinal, and hilar lymph nodes, as well as bulky lymph nodes in the partially  imaged upper abdomen, similar in size compared to CT dated 04/30/2019 and in keeping with diagnosis of CLL. Electronically Signed   By: Alex  Bibbey M.D.   On: 08/05/2019 15:26   CT ABDOMEN PELVIS W CONTRAST  Result Date: 08/06/2019 CLINICAL DATA:  CLL/SLL. EXAM: CT ABDOMEN AND PELVIS WITH CONTRAST TECHNIQUE: Multidetector CT imaging of the abdomen and pelvis was performed using the standard protocol following bolus administration of intravenous contrast. CONTRAST:  100mL OMNIPAQUE IOHEXOL 300 MG/ML  SOLN COMPARISON:  04/30/2019 FINDINGS: Lower chest: There are moderate to large bilateral pleural effusions that are only partially visualized on this exam.The heart size is borderline enlarged. There are few ground-glass airspace opacities at the lung bases with interlobular septal thickening suggestive of volume overload/developing pulmonary edema. Hepatobiliary: The liver is normal. There is layering hyperdense material within the gallbladder lumen. This may represent gallbladder sludge or vicarious excretion of contrast from the patient's prior contrast enhanced CT.There is no biliary ductal dilation. Pancreas: Normal contours without ductal dilatation. No peripancreatic fluid collection. Spleen: The spleen is significantly enlarged Adrenals/Urinary Tract: --Adrenal glands: No adrenal hemorrhage. --Right kidney/ureter: No hydronephrosis or perinephric hematoma. --Left kidney/ureter: No hydronephrosis or perinephric hematoma. --Urinary bladder: Unremarkable. Stomach/Bowel: --Stomach/Duodenum: No hiatal hernia or other gastric abnormality. Normal duodenal course and caliber. --Small bowel: No dilatation or inflammation. --Colon: No focal abnormality. --Appendix: Normal. Vascular/Lymphatic: Atherosclerotic calcification is present within the non-aneurysmal abdominal aorta, without hemodynamically significant stenosis. --there is extensive retroperitoneal adenopathy --there is extensive mesenteric adenopathy  --there is extensive pelvic and inguinal adenopathy. Overall, the adenopathy appears to be relatively stable to slightly improved when compared to prior study. Reproductive: Unremarkable Other: No ascites or free air. The abdominal wall is normal. Musculoskeletal. 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 There is no acute displaced fracture. IMPRESSION: 1. Moderate to large bilateral pleural effusions with adjacent atelectasis. 2. Extensive adenopathy throughout the abdomen and pelvis, similar to slightly improved when compared to September 2020 CT. 3. Splenomegaly. 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 Aortic Atherosclerosis (ICD10-I70.0). Electronically Signed   By: Christopher  Green M.D.   On: 08/06/2019 22:34   DG Chest Port 1 View  Result Date: 08/12/2019 CLINICAL DATA:  Acute respiratory failure. EXAM: PORTABLE CHEST 1 VIEW COMPARISON:  08/11/2019 FINDINGS: Endotracheal tube and nasogastric tube have been removed. Persistent interstitial densities but there is improved aeration in the right lower lung. Heart size is within normal limits. Persistent hazy densities at the lung bases and the medial aspect of the left hemidiaphragm remains obscured. Negative for a pneumothorax. Bone structures are unremarkable. IMPRESSION: 1. Slightly improved aeration in the lungs. Findings may represent decreasing pulmonary edema. 2. Persistent hazy densities at the lung base are suggestive for atelectasis and probable pleural effusions. Suspect focal volume loss or consolidation at the medial left lung base. Electronically Signed   By: Adam  Henn M.D.   On: 08/12/2019 08:15   DG Chest Port 1 View  Result Date: 08/11/2019 CLINICAL DATA:  ETT, respiratory failure EXAM: PORTABLE CHEST 1 VIEW COMPARISON:  08/10/2019 FINDINGS: Interval increase in heterogeneous opacity of the right lung base and a   probable layering pleural effusion. Otherwise unchanged mild,  diffuse interstitial opacity. Endotracheal tube and esophagogastric tube are unchanged. IMPRESSION: 1. Interval increase in heterogeneous opacity of the right lung base and a probable layering pleural effusion. 2. Otherwise unchanged mild, diffuse interstitial opacity, in keeping with infection or edema. 3.  Endotracheal tube and esophagogastric tube are unchanged. Electronically Signed   By: Alex  Bibbey M.D.   On: 08/11/2019 09:00   DG Chest Port 1 View  Result Date: 08/10/2019 CLINICAL DATA:  Respiratory failure. EXAM: PORTABLE CHEST 1 VIEW COMPARISON:  August 09, 2019. FINDINGS: Stable cardiomediastinal silhouette. Endotracheal and nasogastric tubes are unchanged in position. No pneumothorax or pleural effusion is noted. Stable bibasilar opacities are noted concerning for edema or possibly inflammation. Bony thorax is unremarkable. IMPRESSION: Stable support apparatus. Stable bibasilar opacities are noted concerning for edema or possibly inflammation. Electronically Signed   By: James  Green Jr M.D.   On: 08/10/2019 10:29   DG Chest Port 1 View  Result Date: 08/09/2019 CLINICAL DATA:  Endotracheal tube EXAM: PORTABLE CHEST 1 VIEW COMPARISON:  08/08/2019 FINDINGS: No significant interval change in AP portable examination with endotracheal tube and esophagogastric tube in position. There remains mild, diffuse interstitial pulmonary opacity throughout, consistent with mild edema or infection. No new or focal airspace opacity. The heart and mediastinum are unremarkable. IMPRESSION: No significant interval change in diffuse interstitial opacity throughout both lungs, consistent with mild edema or infection. Electronically Signed   By: Alex  Bibbey M.D.   On: 08/09/2019 08:07   DG CHEST PORT 1 VIEW  Result Date: 08/08/2019 CLINICAL DATA:  48-year-old male status post cardiac arrest yesterday. History of CLL and suspected recent pneumonia. Negative for COVID-19. EXAM: PORTABLE CHEST 1 VIEW COMPARISON:   08/07/2019 and earlier. FINDINGS: Portable AP semi upright view at 0531 hours. Endotracheal tube tip in good position between the level the clavicles and carina. Enteric tube courses to the abdomen, tip not included. Course and asymmetric bilateral pulmonary interstitial opacity with regressed more confluent perihilar opacity since yesterday. The ventilation now resembles that on 08/05/2019. There is no longer confluent opacity at the left lateral lung base. No pneumothorax or pleural effusion. Paucity of bowel gas. No acute osseous abnormality identified. IMPRESSION: 1. Satisfactory lines and tubes. 2. Improved ventilation since yesterday with regressed perihilar opacity. Continued coarse interstitial opacity resembling that on 08/05/2019 which was also demonstrated by CTA at that time. 3. No new cardiopulmonary abnormality. Electronically Signed   By: H  Hall M.D.   On: 08/08/2019 10:48   DG CHEST PORT 1 VIEW  Result Date: 08/07/2019 CLINICAL DATA:  Tube placement, code EXAM: PORTABLE CHEST 1 VIEW COMPARISON:  06/05/2019 FINDINGS: Interval placement of endotracheal tube, tip projecting over the mid trachea. Esophagogastric tube, poorly visualized due to underpenetration although appears to be with tip and side port below the diaphragm. There is mild, diffuse interstitial pulmonary opacity. Layering pleural effusions seen on prior CT are not well appreciated. No new airspace opacity. IMPRESSION: 1. Interval placement of endotracheal tube, tip projecting over the mid trachea. 2. Esophagogastric tube, poorly visualized due to underpenetration although appears to be with tip and side port below the diaphragm. 3. There is mild, diffuse interstitial pulmonary opacity. Layering pleural effusions seen on prior CT are not well appreciated. No new airspace opacity. Electronically Signed   By: Alex  Bibbey M.D.   On: 08/07/2019 14:10   DG Chest Port 1 View  Result Date: 08/05/2019 CLINICAL DATA:  PT c/o worsening  SOB   since last night. Pt states he is normally somewhat SOB at baseline. Hx HTN, diabetes, COPD, asthma, former smoker EXAM: PORTABLE CHEST - 1 VIEW COMPARISON:  08/02/2019 FINDINGS: Increasing patchy airspace infiltrate laterally at the left lung base. Diffuse interstitial markings throughout both lungs as before. Heart size normal. Right paratracheal soft tissue consistent with adenopathy. The aortopulmonary window is obscured suggesting adenopathy or mass. Blunting of left lateral costophrenic angle. No pneumothorax. Visualized bones unremarkable. IMPRESSION: 1. Increasing patchy airspace infiltrate laterally at the left lung base suspicious for pneumonia. 2. Probable mediastinal adenopathy. 3. Diffuse interstitial markings throughout both lungs as before. Electronically Signed   By: D  Hassell M.D.   On: 08/05/2019 09:12   DG CHEST PORT 1 VIEW  Result Date: 08/01/2019 CLINICAL DATA:  48-year-old male with weakness. Acute on chronic anemia, thrombocytopenia. Chronic lymphocytic leukemia. Negative for COVID-19. Yesterday. EXAM: PORTABLE CHEST 1 VIEW COMPARISON:  Chest radiographs 07/31/2019 and earlier. FINDINGS: Portable AP upright view at 1518 hours. Stable lung volumes and mediastinal contours. Mediastinal lymphadenopathy demonstrated by CT in September likely persists. Stable mild bilateral nonspecific pulmonary interstitial markings, slightly greater on the right. No superimposed pneumothorax, pleural effusion or consolidation. Visualized tracheal air column is within normal limits. Negative visible bowel gas pattern. No acute osseous abnormality identified. IMPRESSION: 1. Persistent nonspecific pulmonary interstitial markings greater on the right. Doubt pulmonary edema. Atypical infection remains possible. But this might be related to the patient's CLL given suggestion of some nonspecific interstitial opacity by CT in September. 2. Evidence of mediastinal lymphadenopathy stable since September. 3. No new  cardiopulmonary abnormality since yesterday. Electronically Signed   By: H  Hall M.D.   On: 08/01/2019 16:59   DG Abd Portable 1V  Result Date: 08/09/2019 CLINICAL DATA:  OG tube placement EXAM: PORTABLE ABDOMEN - 1 VIEW COMPARISON:  Concurrent chest radiograph, CT abdomen pelvis 08/06/2019 FINDINGS: Tip and side port of the transesophageal tube or distal to the GE junction, terminating in the region of the gastric antrum/duodenal bulb. Bowel gas pattern is nonobstructive. No acute soft tissue abnormality is seen. Osseous structures are unremarkable. IMPRESSION: 1. Tip of the transesophageal tube in the region of the gastric antrum/duodenal bulb. 2. Nonobstructive bowel gas pattern. Electronically Signed   By: Price  DeHay M.D.   On: 08/09/2019 14:41   ECHOCARDIOGRAM COMPLETE  Result Date: 08/08/2019   ECHOCARDIOGRAM REPORT   Patient Name:   Larry Lam Date of Exam: 08/08/2019 Medical Rec #:  [id number]            Height:       70.0 in Accession #:    [id number]           Weight:       223.3 lb Date of Birth:  06/06/1971            BSA:          2.19 m Patient Age:    48 years             BP:           119/61 mmHg Patient Gender: M                    HR:           68 bpm. Exam Location:  Inpatient Procedure: 2D Echo Indications:    Cardiac arrest I46.9  History:        Patient has prior history of Echocardiogram examinations, most                   recent 04/28/2017. COPD, Signs/Symptoms:Hypotension; Risk                 Factors:Diabetes. Acute on chronic respiratory failure.  Sonographer:    Vikki Ports Turrentine Referring Phys: [id number] Francesca Jewett  Sonographer Comments: Technically difficult study due to poor echo windows and echo performed with patient supine and on artificial respirator. Image acquisition challenging due to patient body habitus. IMPRESSIONS  1. Left ventricular ejection fraction, by visual estimation, is 55 to 60%. The left ventricle has normal function. There is no left  ventricular hypertrophy.  2. Left ventricular diastolic parameters are consistent with Grade I diastolic dysfunction (impaired relaxation).  3. The left ventricle has no regional wall motion abnormalities.  4. Global right ventricle has mildly reduced systolic function.The right ventricular size is normal. No increase in right ventricular wall thickness.  5. Left atrial size was mildly dilated.  6. Right atrial size was normal.  7. Trivial pericardial effusion is present.  8. The mitral valve is grossly normal. Trivial mitral valve regurgitation.  9. The tricuspid valve is grossly normal. Tricuspid valve regurgitation is trivial. 10. The aortic valve is tricuspid. Aortic valve regurgitation is not visualized. 11. The pulmonic valve was grossly normal. Pulmonic valve regurgitation is trivial. 12. Moderately elevated pulmonary artery systolic pressure. 13. The inferior vena cava is dilated in size with <50% respiratory variability, suggesting right atrial pressure of 15 mmHg. FINDINGS  Left Ventricle: Left ventricular ejection fraction, by visual estimation, is 55 to 60%. The left ventricle has normal function. The left ventricle has no regional wall motion abnormalities. There is no left ventricular hypertrophy. Left ventricular diastolic parameters are consistent with Grade I diastolic dysfunction (impaired relaxation). Indeterminate filling pressures. Right Ventricle: The right ventricular size is normal. No increase in right ventricular wall thickness. Global RV systolic function is has mildly reduced systolic function. The tricuspid regurgitant velocity is 2.65 m/s, and with an assumed right atrial pressure of 15 mmHg, the estimated right ventricular systolic pressure is moderately elevated at 43.1 mmHg. Left Atrium: Left atrial size was mildly dilated. Right Atrium: Right atrial size was normal in size Pericardium: Trivial pericardial effusion is present. Mitral Valve: The mitral valve is grossly normal. Trivial  mitral valve regurgitation. Tricuspid Valve: The tricuspid valve is grossly normal. Tricuspid valve regurgitation is trivial. Aortic Valve: The aortic valve is tricuspid. Aortic valve regurgitation is not visualized. Pulmonic Valve: The pulmonic valve was grossly normal. Pulmonic valve regurgitation is trivial. Pulmonic regurgitation is trivial. Aorta: The aortic root and ascending aorta are structurally normal, with no evidence of dilitation. Venous: IVC assessment for right atrial pressure unable to be performed due to mechanical ventilation. The inferior vena cava is dilated in size with less than 50% respiratory variability, suggesting right atrial pressure of 15 mmHg. IAS/Shunts: No atrial level shunt detected by color flow Doppler.  LEFT VENTRICLE PLAX 2D LVIDd:         4.80 cm  Diastology LVIDs:         3.30 cm  LV e' lateral:   10.40 cm/s LV PW:         1.00 cm  LV E/e' lateral: 12.9 LV IVS:        1.00 cm  LV e' medial:    7.83 cm/s LVOT diam:     2.10 cm  LV E/e' medial:  17.1 LV SV:         63 ml LV SV Index:   27.96 LVOT Area:  3.46 cm  LEFT ATRIUM             Index       RIGHT ATRIUM           Index LA diam:        4.10 cm 1.87 cm/m  RA Area:     16.90 cm LA Vol (A2C):   44.2 ml 20.21 ml/m RA Volume:   45.60 ml  20.85 ml/m LA Vol (A4C):   79.0 ml 36.11 ml/m LA Biplane Vol: 62.3 ml 28.48 ml/m  AORTIC VALVE LVOT Vmax:   112.00 cm/s LVOT Vmean:  82.200 cm/s LVOT VTI:    0.215 m  AORTA Ao Root diam: 3.40 cm MITRAL VALVE                         TRICUSPID VALVE MV Area (PHT): 3.54 cm              TR Peak grad:   28.1 mmHg MV PHT:        62.06 msec            TR Vmax:        265.00 cm/s MV Decel Time: 214 msec MV E velocity: 134.00 cm/s 103 cm/s  SHUNTS MV A velocity: 115.00 cm/s 70.3 cm/s Systemic VTI:  0.22 m MV E/A ratio:  1.17        1.5       Systemic Diam: 2.10 cm  Kenneth Hilty MD Electronically signed by Kenneth Hilty MD Signature Date/Time: 08/08/2019/11:37:42 AM    Final     ASSESSMENT  AND PLAN: 1. Progressive CLL -The patient has a history of lymphadenopathy since 2010. -He was diagnosed in August 2020 by flow cytometry, CD5 positive, CD23 positive lymphocytes consistent with CLL -CLL FISH panel showed ATM mutation. T p53 by FISH was negative. I GVH mutation positive. -He developed worsening anemia and thrombocytopenia along with constitutional symptoms including anorexia, weight loss, and bulky adenopathy. -Repeat flow cytometry on 08/03/2019 was again consistent with CLL, CD5 positive, CD23 positive. -CT angiogram of the chest on 08/05/2019 and CT of the abdomen pelvis with contrast on 08/06/2019 show continued widespread bulky adenopathy. -PET scan and bone marrow biopsy have been ordered previously, but not yet completed. -The patient was under consideration to begin treatment with venetoclax and obinutuzumab pending completion of work-up. -Status post bone marrow biopsy earlier today and will follow up on the results as an outpatient.  -Received first dose of obinutuzumab on 08/11/2019 and tolerated well.  -Uric acid has normalized this morning.  We will reduce his dose of allopurinol to 100 mg twice a day.  He will need to continue this as an outpatient. -I will arrange for outpatient PET scan once discharged.   2. Acute respiratory failure -abnormal CT of the chest?pneumonia vs. CLL involvement -Now back at baseline O2 at 2 L/min.  3.  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 -Vitamin B12 level was low at 168 on 08/06/2019. -Has been receiving vitamin B12 injections daily with improvement of his hemoglobin.  We can reevaluate the need for vitamin B12 injections as an outpatient. -Bone marrow biopsy pending. -May also be immune-mediated.  Recommend continued high-dose steroids with prednisone 1 mg/kg daily (based on current weight this would be 100 mg daily).  Recommend for him to continue this dose until he sees us  for follow-up next week.  4.  Thrombocytopenia -  Suspect this is due to bone marrow infiltration from CLL versus severe B12 deficiency. -Platelet count is up to 26,000 today. -There is no active bleeding. -Recommend platelet transfusion for platelet count less than 20,000 or active bleeding.  -May also be immune-mediated. High dose steroids as above.   [facility]ie[facility].    LOS: 8 days   Kristin Curcio, DNP, AGPCNP-BC, AOCNP 08/13/19  

## 2019-08-13 NOTE — Care Management Important Message (Signed)
Important Message  Patient Details  Name: OLAN LOUGHNANE MRN: [id number] Date of Birth: [birth year]   Medicare Important Message Given:  Yes     Shelda Altes 08/13/2019, 12:13 PM

## 2019-08-13 NOTE — Discharge Summary (Signed)
Physician Discharge Summary  Larry Lam XTK:[id number] DOB: [birth year] DOA: 08/05/2019  PCP: [name], MD  Admit date: 08/05/2019 Discharge date: 08/13/2019  Admitted From: Home, Transfer from [facility] Disposition:  Home  Recommendations for Outpatient Follow-up:  1. Follow up with PCP in 1-2 weeks 2. Follow-up with oncology as scheduled on 08/18/2019 3. Continue prednisone 100 mg p.o. daily per oncology recommendations until follow-up 4. Started on Levemir 30 units subcutaneously daily for control of his glucose likely exacerbated by steroid induced hyperglycemia 5. Discontinue glipizide Metformin in favor of insulin for now 6. Started on Protonix for GI protection 7. Allopurinol 100 mg p.o. twice daily for hyperuricemia secondary to chemotherapy 8. Discontinue lisinopril secondary to elevation in creatinine, monitor blood pressure outpatient and consider restarting if creatinine normalizes 9. Please obtain BMP/CBC in one week 10. Consider follow-up imaging for incidental findings of pulmonary nodules on CT scan 11. 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  On 08/07/2019, patient had respiratory/cardiac arrest and subsequently was intubated and transferred to [facility] under the pulmonary critical care medicine service.  Extubation attempt on 08/09/2019 was unsuccessful but was ultimately extubated on 08/11/2019.  Patient was  transferred from the PCCM service to [facility] hospitalist service on 08/13/2019.  Hospital course:  Acute on chronic respiratory failure with hypoxia and hypercapnia Respiratory arrest PEA arrest Hx COPD on 2L [place] baseline Patient initially presented to [facility] with progressive dyspnea.  Was initially placed on BiPAP and started on empiric antibiotics with azithromycin, vancomycin and cefepime for multifocal pneumonia.  CTPA was negative for pulmonary embolism.  On the afternoon of 08/07/2019, CODE BLUE was initiated for respiratory arrest when the patient was found with his nasal cannula off, and was noted to be in PEA arrest in which CPR was initiated.  Patient was treated in accordance with ACLS guidelines and given several rounds of epinephrine, IV bicarbonate and IV fluid hydration and ultimately intubated.  Patient was then transferred to [facility] for care under the pulmonary critical care service. Sputum culture returned back Stenotropomonas and MSSA pneumonia with sensitivity to Bactrim and his antibiotics were deescalated accordingly.  Patient initially was attempted and extubation on 08/09/2019 which failed and reattempted on 08/11/2019 with success.  Patient was transferred from the PCCM service to hospital service on 08/13/2019.  Patient is now on his normal supplemental oxygen at 2 L per nasal cannula.  Resume his home inhalers.  Stenotrophomonas pneumonia MSSA pneumonia Continue Bactrim DS BID to complete a 7-day course.  CLL CT on admission notable for numerous bulky bilateral axillary, lower cervical, mediastinal and hilar lymph nodes as well as enlarged lymph nodes in the partially imaged upper abdomen.  Oncology was consulted and followed during hospital course.  Patient received Obinutuzamab on 12/15 and 12/16.  Continue high-dose steroids with prednisone 100 mg p.o. daily per oncology.  Was also started on allopurinol for hyperuricemia secondary to chemotherapy  with uric acid 7.5, now down to 4.7 at time of discharge.  We will continue allopurinol 100 mg  p.o. daily.  Bone marrow biopsy performed on 08/13/2019, results pending at time of discharge.  Has follow-up scheduled with oncology in the cancer center on 08/18/2019.    T2DM Steroid-induced hyperglycemia On glipizide and metformin outpatient.  Hemoglobin A1c 7.7.  Will discontinue home regimen in favor of Levemir 30 units subcutaneously daily given high-dose steroids on discharge.  Recommend continue to monitor blood sugar and follow-up with PCP for further adjustments if necessary.  Pulmonary nodules Pulmonary nodules noted on CT PA, right pulmonary apex, right lower lobe; etiology likely infectious versus inflammatory.  Recommend interval follow-up imaging studies to ensure resolution for further surveillance.   Discharge Diagnoses:  Active Problems:   DM (diabetes mellitus) (HCC)   CLL (chronic lymphocytic leukemia) (HCC)   Multifocal pneumonia   Idiopathic hypotension   Anemia due to vitamin B12 deficiency   Thrombocytopenia [facility])    Discharge Instructions  Discharge Instructions    Call MD for:  difficulty breathing, headache or visual disturbances   Complete by: As directed    Call MD for:  extreme fatigue   Complete by: As directed    Call MD for:  persistant dizziness or light-headedness   Complete by: As directed    Call MD for:  persistant nausea and vomiting   Complete by: As directed    Call MD for:  severe uncontrolled pain   Complete by: As directed    Call MD for:  temperature >100.4   Complete by: As directed    Diet - low sodium heart healthy   Complete by: As directed    Increase activity slowly   Complete by: As directed      Allergies as of 08/13/2019      Reactions   Amlodipine Besylate-valsartan Other (See Comments)   Lowered potassium   Hydrochlorothiazide Other (See Comments)   Lowered potassium      Medication List    STOP taking these  medications   azithromycin 250 MG tablet Commonly known as: Zithromax Z-Pak   glipiZIDE 5 MG tablet Commonly known as: GLUCOTROL   lisinopril 20 MG tablet Commonly known as: ZESTRIL   metFORMIN 500 MG tablet Commonly known as: GLUCOPHAGE     TAKE these medications   albuterol 108 (90 Base) MCG/ACT inhaler Commonly known as: VENTOLIN HFA Inhale 2 puffs into the lungs every 6 (six) hours as needed for wheezing or shortness of breath.   allopurinol 100 MG tablet Commonly known as: ZYLOPRIM Take 1 tablet (100 mg total) by mouth 2 (two) times daily.   ASPIRIN 81 PO Take 81 mg by mouth.   atorvastatin 40 MG tablet Commonly known as: LIPITOR Take 1 tablet by mouth daily.   escitalopram 20 MG tablet Commonly known as: LEXAPRO Take 20 mg by mouth daily.   Fluticasone-Salmeterol 250-50 MCG/DOSE Aepb Commonly known as: ADVAIR Inhale into the lungs.   Incruse Ellipta 62.5 MCG/INH Aepb Generic drug: umeclidinium bromide Inhale into the lungs.   Insulin Detemir 100 UNIT/ML Pen Commonly known as: LEVEMIR Inject 30 Units into the skin daily.   ipratropium-albuterol 0.5-2.5 (3) MG/3ML Soln Commonly known as: DUONEB Take 3 mLs by nebulization every 6 (six) hours as needed. J44.9 and J44.1   pantoprazole 40 MG tablet Commonly known as: Protonix Take 1 tablet (40 mg total) by mouth daily.   Pen Needles 3/16" 31G X 5 MM Misc Use as directed with insulin pen   predniSONE 20 MG tablet Commonly known as: Deltasone Take 5 tablets (100 mg total) by  mouth daily for 7 days.   sulfamethoxazole-trimethoprim 800-160 MG tablet Commonly known as: BACTRIM DS Take 2 tablets by mouth every 12 (twelve) hours for 5 days.   vitamin B-12 1000 MCG tablet Commonly known as: CYANOCOBALAMIN Take 1 tablet (1,000 mcg total) by mouth daily.            Durable Medical Equipment  (From admission, onward)         Start     Ordered   08/06/19 1530  For home use only DME standard manual  wheelchair with seat cushion  Once    Comments: Patient suffers from chronic COPD, New CLL/CML which impairs their ability to perform daily activities like in the home.  Crutches will not resolve issue with performing activities of daily living. A wheelchair will allow patient to safely perform daily activities. Patient can safely propel the wheelchair in the home or has a caregiver who can provide assistance. Length of need 99 Months. Accessories: elevating leg rests (ELRs), wheel locks, extensions and anti-tippers.   08/06/19 1534         Follow-up Information    Aycock, Ngwe A, MD. Schedule an appointment as soon as possible for a visit in 1 week(s).   Specialty: Family Medicine Contact information: [place] [address] [phone]        Curt Bears, MD Follow up on 08/18/2019.   Specialty: Oncology Contact information: [address] [place] [address] [phone]          Allergies  Allergen Reactions  . Amlodipine Besylate-Valsartan Other (See Comments)    Lowered potassium  . Hydrochlorothiazide Other (See Comments)    Lowered potassium    Consultations:  PCCM  Oncology   Procedures/Studies: DG Chest 1 View  Result Date: 08/09/2019 CLINICAL DATA:  ETT placement EXAM: CHEST  1 VIEW COMPARISON:  Radiographs 08/09/2019 FINDINGS: Endotracheal tube in the mid trachea, 4.5 cm from the carina. Transesophageal tube tip beyond the GE junction curling in the left upper quadrant, terminating below the level of imaging. Diffuse interstitial pulmonary opacity throughout both lungs with few more patchy areas of opacity. Some right basilar pleural thickening compatible with fusion. Suspect at least trace left effusion as well. Cardiomediastinal contours are stable. No acute osseous or soft tissue abnormality. IMPRESSION: 1. Endotracheal tube 4.5 cm from the carina. 2. Transesophageal tube tip curls in the left upper quadrant,  terminating below the level of imaging. 3. Diffuse interstitial airspace opacities compatible with infection and/or edema. Overall similar in extent to comparison studies. Electronically Signed   By: Lovena Le M.D.   On: 08/09/2019 14:40   DG Chest 2 View  Result Date: 08/02/2019 CLINICAL DATA:  Shortness of breath, recent discharge, CLL EXAM: CHEST - 2 VIEW COMPARISON:  Chest CT 04/30/2019, chest radiograph 08/01/2019 FINDINGS: Diffusely increased interstitial markings are again seen. No pneumothorax. More nodular masslike opacity seen in the left costophrenic sulcus possibly reflecting some prominent lymph nodes in the pericardial fat pad on comparison chest CT. Additional ovoid opacities are seen in the soft tissues of the axilla and base of the neck. Bilateral effusions are noted. Cardiomediastinal contours are similar to prior with nodular densities in the hila likely reflecting adenopathy. No acute osseous abnormality. Degenerative changes in the shoulders. IMPRESSION: Nonspecific increased interstitial markings with bilateral effusions. Could reflect atypical infection or edema. Nodular opacity in the left costophrenic sulcus may reflect adenopathy within the pericardial fat. Additional nodular opacities in the hila and axilla  likely reflecting further adenopathy in the setting of CLL. Electronically Signed   By: Lovena Le M.D.   On: 08/02/2019 22:13   DG Chest 2 View  Result Date: 07/31/2019 CLINICAL DATA:  Shortness of breath. EXAM: CHEST - 2 VIEW COMPARISON:  April 27, 2017 FINDINGS: Cardiomediastinal silhouette is stable. No pneumothorax. No nodules or masses. Increased lung markings bilaterally with interstitial components. There is a small left pleural effusion. IMPRESSION: 1. Increase lung markings centrally with interstitial components suggestive of atypical infection/pneumonia. Bronchitis is a possibility. There is a small associated left effusion. Electronically Signed   By: Dorise Bullion III M.D   On: 07/31/2019 02:34   CT Angio Chest PE W/Cm &/Or Wo Cm  Result Date: 08/05/2019 CLINICAL DATA:  Shortness of breath, CLL EXAM: CT ANGIOGRAPHY CHEST WITH CONTRAST TECHNIQUE: Multidetector CT imaging of the chest was performed using the standard protocol during bolus administration of intravenous contrast. Multiplanar CT image reconstructions and MIPs were obtained to evaluate the vascular anatomy. CONTRAST:  125m OMNIPAQUE IOHEXOL 350 MG/ML SOLN COMPARISON:  CT chest abdomen pelvis, 04/30/2011 FINDINGS: Cardiovascular: Satisfactory opacification of the pulmonary arteries to the segmental level. No evidence of pulmonary embolism. Normal heart size. No pericardial effusion. Mediastinum/Nodes: Numerous bulky bilateral axillary, lower cervical, mediastinal, and hilar lymph nodes, similar in size compared to CT dated 04/30/2019. Thyroid gland, trachea, and esophagus demonstrate no significant findings. Lungs/Pleura: Small bilateral pleural effusions. Diffuse bilateral interlobular septal thickening and scattered ground-glass opacities throughout the lungs. There are occasional small pulmonary nodules, which are new or enlarged when compared to prior CT. A nodule of the right pulmonary apex measures 7 mm, previously 3 mm when measured similarly (series 7, image 24). A new nodule of the right lower lobe measures 6 mm (series 7, image 84). Upper Abdomen: No acute abnormality. There is redemonstrated bulky celiac axis and gastrohepatic ligament lymphadenopathy, again similar to prior examination dated 04/30/2019 to the extent imaged. Musculoskeletal: No chest wall abnormality. No acute or significant osseous findings. Review of the MIP images confirms the above findings. IMPRESSION: 1.  Negative examination for pulmonary embolism. 2. Small bilateral pleural effusions with diffuse bilateral interlobular septal thickening and scattered ground-glass opacity throughout the lungs. These findings are  consistent with infection or edema. 3. There are occasional small pulmonary nodules, which are new or enlarged when compared to prior CT. A nodule of the right pulmonary apex measures 7 mm, previously 3 mm when measured similarly (series 7, image 24). A new nodule of the right lower lobe measures 6 mm (series 7, image 84). These are most likely infectious or inflammatory; pulmonary nodules are an unlikely manifestation of pulmonary involvement of lymphoma. 4. Numerous bulky bilateral axillary, lower cervical, mediastinal, and hilar lymph nodes, as well as bulky lymph nodes in the partially imaged upper abdomen, similar in size compared to CT dated 04/30/2019 and in keeping with diagnosis of CLL. Electronically Signed   By: AEddie CandleM.D.   On: 08/05/2019 15:26   CT ABDOMEN PELVIS W CONTRAST  Result Date: 08/06/2019 CLINICAL DATA:  CLL/SLL. EXAM: CT ABDOMEN AND PELVIS WITH CONTRAST TECHNIQUE: Multidetector CT imaging of the abdomen and pelvis was performed using the standard protocol following bolus administration of intravenous contrast. CONTRAST:  108mOMNIPAQUE IOHEXOL 300 MG/ML  SOLN COMPARISON:  04/30/2019 FINDINGS: Lower chest: There are moderate to large bilateral pleural effusions that are only partially visualized on this exam.The heart size is borderline enlarged. There are few ground-glass airspace opacities at the lung  bases with interlobular septal thickening suggestive of volume overload/developing pulmonary edema. Hepatobiliary: The liver is normal. There is layering hyperdense material within the gallbladder lumen. This may represent gallbladder sludge or vicarious excretion of contrast from the patient's prior contrast enhanced CT.There is no biliary ductal dilation. Pancreas: Normal contours without ductal dilatation. No peripancreatic fluid collection. Spleen: The spleen is significantly enlarged Adrenals/Urinary Tract: --Adrenal glands: No adrenal hemorrhage. --Right kidney/ureter: No  hydronephrosis or perinephric hematoma. --Left kidney/ureter: No hydronephrosis or perinephric hematoma. --Urinary bladder: Unremarkable. Stomach/Bowel: --Stomach/Duodenum: No hiatal hernia or other gastric abnormality. Normal duodenal course and caliber. --Small bowel: No dilatation or inflammation. --Colon: No focal abnormality. --Appendix: Normal. Vascular/Lymphatic: Atherosclerotic calcification is present within the non-aneurysmal abdominal aorta, without hemodynamically significant stenosis. --there is extensive retroperitoneal adenopathy --there is extensive mesenteric adenopathy --there is extensive pelvic and inguinal adenopathy. Overall, the adenopathy appears to be relatively stable to slightly improved when compared to prior study. Reproductive: Unremarkable Other: No ascites or free air. The abdominal wall is normal. Musculoskeletal. 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 There is no acute displaced fracture. IMPRESSION: 1. Moderate to large bilateral pleural effusions with adjacent atelectasis. 2. Extensive adenopathy throughout the abdomen and pelvis, similar to slightly improved when compared to September 2020 CT. 3. Splenomegaly. 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 Aortic Atherosclerosis (ICD10-I70.0). Electronically Signed   By: Constance Holster M.D.   On: 08/06/2019 22:34   DG Chest Port 1 View  Result Date: 08/12/2019 CLINICAL DATA:  Acute respiratory failure. EXAM: PORTABLE CHEST 1 VIEW COMPARISON:  08/11/2019 FINDINGS: Endotracheal tube and nasogastric tube have been removed. Persistent interstitial densities but there is improved aeration in the right lower lung. Heart size is within normal limits. Persistent hazy densities at the lung bases and the medial aspect of the left hemidiaphragm remains obscured. Negative for a pneumothorax. Bone structures are unremarkable. IMPRESSION: 1. Slightly improved aeration in the lungs.  Findings may represent decreasing pulmonary edema. 2. Persistent hazy densities at the lung base are suggestive for atelectasis and probable pleural effusions. Suspect focal volume loss or consolidation at the medial left lung base. Electronically Signed   By: Markus Daft M.D.   On: 08/12/2019 08:15   DG Chest Port 1 View  Result Date: 08/11/2019 CLINICAL DATA:  ETT, respiratory failure EXAM: PORTABLE CHEST 1 VIEW COMPARISON:  08/10/2019 FINDINGS: Interval increase in heterogeneous opacity of the right lung base and a probable layering pleural effusion. Otherwise unchanged mild, diffuse interstitial opacity. Endotracheal tube and esophagogastric tube are unchanged. IMPRESSION: 1. Interval increase in heterogeneous opacity of the right lung base and a probable layering pleural effusion. 2. Otherwise unchanged mild, diffuse interstitial opacity, in keeping with infection or edema. 3.  Endotracheal tube and esophagogastric tube are unchanged. Electronically Signed   By: Eddie Candle M.D.   On: 08/11/2019 09:00   DG Chest Port 1 View  Result Date: 08/10/2019 CLINICAL DATA:  Respiratory failure. EXAM: PORTABLE CHEST 1 VIEW COMPARISON:  August 09, 2019. FINDINGS: Stable cardiomediastinal silhouette. Endotracheal and nasogastric tubes are unchanged in position. No pneumothorax or pleural effusion is noted. Stable bibasilar opacities are noted concerning for edema or possibly inflammation. Bony thorax is unremarkable. IMPRESSION: Stable support apparatus. Stable bibasilar opacities are noted concerning for edema or possibly inflammation. Electronically Signed   By: Marijo Conception M.D.   On: 08/10/2019 10:29   DG Chest Port 1 View  Result Date: 08/09/2019 CLINICAL DATA:  Endotracheal tube  EXAM: PORTABLE CHEST 1 VIEW COMPARISON:  08/08/2019 FINDINGS: No significant interval change in AP portable examination with endotracheal tube and esophagogastric tube in position. There remains mild, diffuse interstitial  pulmonary opacity throughout, consistent with mild edema or infection. No new or focal airspace opacity. The heart and mediastinum are unremarkable. IMPRESSION: No significant interval change in diffuse interstitial opacity throughout both lungs, consistent with mild edema or infection. Electronically Signed   By: Eddie Candle M.D.   On: 08/09/2019 08:07   DG CHEST PORT 1 VIEW  Result Date: 08/08/2019 CLINICAL DATA:  48 year old male status post cardiac arrest yesterday. History of CLL and suspected recent pneumonia. Negative for COVID-19. EXAM: PORTABLE CHEST 1 VIEW COMPARISON:  08/07/2019 and earlier. FINDINGS: Portable AP semi upright view at 0531 hours. Endotracheal tube tip in good position between the level the clavicles and carina. Enteric tube courses to the abdomen, tip not included. Course and asymmetric bilateral pulmonary interstitial opacity with regressed more confluent perihilar opacity since yesterday. The ventilation now resembles that on 08/05/2019. There is no longer confluent opacity at the left lateral lung base. No pneumothorax or pleural effusion. Paucity of bowel gas. No acute osseous abnormality identified. IMPRESSION: 1. Satisfactory lines and tubes. 2. Improved ventilation since yesterday with regressed perihilar opacity. Continued coarse interstitial opacity resembling that on 08/05/2019 which was also demonstrated by CTA at that time. 3. No new cardiopulmonary abnormality. Electronically Signed   By: Genevie Ann M.D.   On: 08/08/2019 10:48   DG CHEST PORT 1 VIEW  Result Date: 08/07/2019 CLINICAL DATA:  Tube placement, code EXAM: PORTABLE CHEST 1 VIEW COMPARISON:  06/05/2019 FINDINGS: Interval placement of endotracheal tube, tip projecting over the mid trachea. Esophagogastric tube, poorly visualized due to underpenetration although appears to be with tip and side port below the diaphragm. There is mild, diffuse interstitial pulmonary opacity. Layering pleural effusions seen on  prior CT are not well appreciated. No new airspace opacity. IMPRESSION: 1. Interval placement of endotracheal tube, tip projecting over the mid trachea. 2. Esophagogastric tube, poorly visualized due to underpenetration although appears to be with tip and side port below the diaphragm. 3. There is mild, diffuse interstitial pulmonary opacity. Layering pleural effusions seen on prior CT are not well appreciated. No new airspace opacity. Electronically Signed   By: Eddie Candle M.D.   On: 08/07/2019 14:10   DG Chest Port 1 View  Result Date: 08/05/2019 CLINICAL DATA:  PT c/o worsening SOB since last night. Pt states he is normally somewhat SOB at baseline. Hx HTN, diabetes, COPD, asthma, former smoker EXAM: PORTABLE CHEST - 1 VIEW COMPARISON:  08/02/2019 FINDINGS: Increasing patchy airspace infiltrate laterally at the left lung base. Diffuse interstitial markings throughout both lungs as before. Heart size normal. Right paratracheal soft tissue consistent with adenopathy. The aortopulmonary window is obscured suggesting adenopathy or mass. Blunting of left lateral costophrenic angle. No pneumothorax. Visualized bones unremarkable. IMPRESSION: 1. Increasing patchy airspace infiltrate laterally at the left lung base suspicious for pneumonia. 2. Probable mediastinal adenopathy. 3. Diffuse interstitial markings throughout both lungs as before. Electronically Signed   By: Lucrezia Europe M.D.   On: 08/05/2019 09:12   DG CHEST PORT 1 VIEW  Result Date: 08/01/2019 CLINICAL DATA:  48 year old male with weakness. Acute on chronic anemia, thrombocytopenia. Chronic lymphocytic leukemia. Negative for COVID-19. Yesterday. EXAM: PORTABLE CHEST 1 VIEW COMPARISON:  Chest radiographs 07/31/2019 and earlier. FINDINGS: Portable AP upright view at 1518 hours. Stable lung volumes and mediastinal contours. Mediastinal lymphadenopathy demonstrated  by CT in September likely persists. Stable mild bilateral nonspecific pulmonary  interstitial markings, slightly greater on the right. No superimposed pneumothorax, pleural effusion or consolidation. Visualized tracheal air column is within normal limits. Negative visible bowel gas pattern. No acute osseous abnormality identified. IMPRESSION: 1. Persistent nonspecific pulmonary interstitial markings greater on the right. Doubt pulmonary edema. Atypical infection remains possible. But this might be related to the patient's CLL given suggestion of some nonspecific interstitial opacity by CT in September. 2. Evidence of mediastinal lymphadenopathy stable since September. 3. No new cardiopulmonary abnormality since yesterday. Electronically Signed   By: Genevie Ann M.D.   On: 08/01/2019 16:59   DG Abd Portable 1V  Result Date: 08/09/2019 CLINICAL DATA:  OG tube placement EXAM: PORTABLE ABDOMEN - 1 VIEW COMPARISON:  Concurrent chest radiograph, CT abdomen pelvis 08/06/2019 FINDINGS: Tip and side port of the transesophageal tube or distal to the GE junction, terminating in the region of the gastric antrum/duodenal bulb. Bowel gas pattern is nonobstructive. No acute soft tissue abnormality is seen. Osseous structures are unremarkable. IMPRESSION: 1. Tip of the transesophageal tube in the region of the gastric antrum/duodenal bulb. 2. Nonobstructive bowel gas pattern. Electronically Signed   By: Lovena Le M.D.   On: 08/09/2019 14:41   ECHOCARDIOGRAM COMPLETE  Result Date: 08/08/2019   ECHOCARDIOGRAM REPORT   Patient Name:   Philo NEHAL SHIVES Date of Exam: 08/08/2019 Medical Rec #:  [id number]            Height:       70.0 in Accession #:    [id number]           Weight:       223.3 lb Date of Birth:  February 10, 1971            BSA:          2.19 m Patient Age:    69 years             BP:           119/61 mmHg Patient Gender: M                    HR:           68 bpm. Exam Location:  Inpatient Procedure: 2D Echo Indications:    Cardiac arrest I46.9  History:        Patient has prior history of  Echocardiogram examinations, most                 recent 04/28/2017. COPD, Signs/Symptoms:Hypotension; Risk                 Factors:Diabetes. Acute on chronic respiratory failure.  Sonographer:    Vikki Ports Turrentine Referring Phys: [id number] Francesca Jewett  Sonographer Comments: Technically difficult study due to poor echo windows and echo performed with patient supine and on artificial respirator. Image acquisition challenging due to patient body habitus. IMPRESSIONS  1. Left ventricular ejection fraction, by visual estimation, is 55 to 60%. The left ventricle has normal function. There is no left ventricular hypertrophy.  2. Left ventricular diastolic parameters are consistent with Grade I diastolic dysfunction (impaired relaxation).  3. The left ventricle has no regional wall motion abnormalities.  4. Global right ventricle has mildly reduced systolic function.The right ventricular size is normal. No increase in right ventricular wall thickness.  5. Left atrial size was mildly dilated.  6. Right atrial size was normal.  7. Trivial pericardial effusion is present.  8. The mitral valve is grossly normal. Trivial mitral valve regurgitation.  9. The tricuspid valve is grossly normal. Tricuspid valve regurgitation is trivial. 10. The aortic valve is tricuspid. Aortic valve regurgitation is not visualized. 11. The pulmonic valve was grossly normal. Pulmonic valve regurgitation is trivial. 12. Moderately elevated pulmonary artery systolic pressure. 13. The inferior vena cava is dilated in size with <50% respiratory variability, suggesting right atrial pressure of 15 mmHg. FINDINGS  Left Ventricle: Left ventricular ejection fraction, by visual estimation, is 55 to 60%. The left ventricle has normal function. The left ventricle has no regional wall motion abnormalities. There is no left ventricular hypertrophy. Left ventricular diastolic parameters are consistent with Grade I diastolic dysfunction (impaired relaxation).  Indeterminate filling pressures. Right Ventricle: The right ventricular size is normal. No increase in right ventricular wall thickness. Global RV systolic function is has mildly reduced systolic function. The tricuspid regurgitant velocity is 2.65 m/s, and with an assumed right atrial pressure of 15 mmHg, the estimated right ventricular systolic pressure is moderately elevated at 43.1 mmHg. Left Atrium: Left atrial size was mildly dilated. Right Atrium: Right atrial size was normal in size Pericardium: Trivial pericardial effusion is present. Mitral Valve: The mitral valve is grossly normal. Trivial mitral valve regurgitation. Tricuspid Valve: The tricuspid valve is grossly normal. Tricuspid valve regurgitation is trivial. Aortic Valve: The aortic valve is tricuspid. Aortic valve regurgitation is not visualized. Pulmonic Valve: The pulmonic valve was grossly normal. Pulmonic valve regurgitation is trivial. Pulmonic regurgitation is trivial. Aorta: The aortic root and ascending aorta are structurally normal, with no evidence of dilitation. Venous: IVC assessment for right atrial pressure unable to be performed due to mechanical ventilation. The inferior vena cava is dilated in size with less than 50% respiratory variability, suggesting right atrial pressure of 15 mmHg. IAS/Shunts: No atrial level shunt detected by color flow Doppler.  LEFT VENTRICLE PLAX 2D LVIDd:         4.80 cm  Diastology LVIDs:         3.30 cm  LV e' lateral:   10.40 cm/s LV PW:         1.00 cm  LV E/e' lateral: 12.9 LV IVS:        1.00 cm  LV e' medial:    7.83 cm/s LVOT diam:     2.10 cm  LV E/e' medial:  17.1 LV SV:         63 ml LV SV Index:   27.96 LVOT Area:     3.46 cm  LEFT ATRIUM             Index       RIGHT ATRIUM           Index LA diam:        4.10 cm 1.87 cm/m  RA Area:     16.90 cm LA Vol (A2C):   44.2 ml 20.21 ml/m RA Volume:   45.60 ml  20.85 ml/m LA Vol (A4C):   79.0 ml 36.11 ml/m LA Biplane Vol: 62.3 ml 28.48 ml/m   AORTIC VALVE LVOT Vmax:   112.00 cm/s LVOT Vmean:  82.200 cm/s LVOT VTI:    0.215 m  AORTA Ao Root diam: 3.40 cm MITRAL VALVE                         TRICUSPID VALVE MV Area (PHT): 3.54 cm  TR Peak grad:   28.1 mmHg MV PHT:        62.06 msec            TR Vmax:        265.00 cm/s MV Decel Time: 214 msec MV E velocity: 134.00 cm/s 103 cm/s  SHUNTS MV A velocity: 115.00 cm/s 70.3 cm/s Systemic VTI:  0.22 m MV E/A ratio:  1.17        1.5       Systemic Diam: 2.10 cm  Lyman Bishop MD Electronically signed by Lyman Bishop MD Signature Date/Time: 08/08/2019/11:37:42 AM    Final       Subjective:   Discharge Exam: Vitals:   08/13/19 1138 08/13/19 1229  BP: 138/79 (!) 152/68  Pulse: (!) 118 (!) 109  Resp: 17   Temp:    SpO2: 93% 94%   Vitals:   08/13/19 1005 08/13/19 1128 08/13/19 1138 08/13/19 1229  BP:   138/79 (!) 152/68  Pulse: 95 (!) 114 (!) 118 (!) 109  Resp: 18  17   Temp:      TempSrc:      SpO2: 100%  93% 94%  Weight:      Height:        General: Pt is alert, awake, not in acute distress Cardiovascular: RRR, S1/S2 +, no rubs, no gallops Respiratory: CTA bilaterally, no wheezing, no rhonchi; on 2 L nasal cannula Abdominal: Soft, NT, ND, bowel sounds + Extremities: no edema, no cyanosis    The results of significant diagnostics from this hospitalization (including imaging, microbiology, ancillary and laboratory) are listed below for reference.     Microbiology: Recent Results (from the past 240 hour(s))  Respiratory Panel by RT PCR (Flu A&B, Covid) - Nasopharyngeal Swab     Status: None   Collection Time: 08/05/19 11:50 AM   Specimen: Nasopharyngeal Swab  Result Value Ref Range Status   SARS Coronavirus 2 by RT PCR NEGATIVE NEGATIVE Final    Comment: (NOTE) SARS-CoV-2 target nucleic acids are NOT DETECTED. The SARS-CoV-2 RNA is generally detectable in upper respiratoy specimens during the acute phase of infection. The lowest concentration of SARS-CoV-2  viral copies this assay can detect is 131 copies/mL. A negative result does not preclude SARS-Cov-2 infection and should not be used as the sole basis for treatment or other patient management decisions. A negative result may occur with  improper specimen collection/handling, submission of specimen other than nasopharyngeal swab, presence of viral mutation(s) within the areas targeted by this assay, and inadequate number of viral copies (<131 copies/mL). A negative result must be combined with clinical observations, patient history, and epidemiological information. The expected result is Negative. Fact Sheet for Patients:  PinkCheek.be Fact Sheet for Healthcare Providers:  GravelBags.it This test is not yet ap proved or cleared by the [place] FDA and  has been authorized for detection and/or diagnosis of SARS-CoV-2 by FDA under an Emergency Use Authorization (EUA). This EUA will remain  in effect (meaning this test can be used) for the duration of the COVID-19 declaration under Section 564(b)(1) of the Act, 21 U.S.C. section 360bbb-3(b)(1), unless the authorization is terminated or revoked sooner.    Influenza A by PCR NEGATIVE NEGATIVE Final   Influenza B by PCR NEGATIVE NEGATIVE Final    Comment: (NOTE) The Xpert Xpress SARS-CoV-2/FLU/RSV assay is intended as an aid in  the diagnosis of influenza from Nasopharyngeal swab specimens and  should not be used as a sole basis for treatment.  Nasal washings and  aspirates are unacceptable for Xpert Xpress SARS-CoV-2/FLU/RSV  testing. Fact Sheet for Patients: PinkCheek.be Fact Sheet for Healthcare Providers: GravelBags.it This test is not yet approved or cleared by the [place] FDA and  has been authorized for detection and/or diagnosis of SARS-CoV-2 by  FDA under an Emergency Use Authorization (EUA). This EUA will  remain  in effect (meaning this test can be used) for the duration of the  Covid-19 declaration under Section 564(b)(1) of the Act, 21  U.S.C. section 360bbb-3(b)(1), unless the authorization is  terminated or revoked. Performed at [facility], [place] [address], [place], [place] [address]   MRSA PCR Screening     Status: None   Collection Time: 08/05/19 12:15 PM   Specimen: Nasal Mucosa; Nasopharyngeal  Result Value Ref Range Status   MRSA by PCR NEGATIVE NEGATIVE Final    Comment:        The GeneXpert MRSA Assay (FDA approved for NASAL specimens only), is one component of a comprehensive MRSA colonization surveillance program. It is not intended to diagnose MRSA infection nor to guide or monitor treatment for MRSA infections. Performed at [facility], [address] [place], [place] [address]   Culture, respiratory (non-expectorated)     Status: None   Collection Time: 08/08/19 12:29 AM   Specimen: Tracheal Aspirate; Respiratory  Result Value Ref Range Status   Specimen Description TRACHEAL ASPIRATE  Final   Special Requests NONE  Final   Gram Stain   Final    RARE WBC PRESENT,BOTH PMN AND MONONUCLEAR NO ORGANISMS SEEN Performed at [facility], [address] [place], [place] [address]    Culture   Final    FEW STENOTROPHOMONAS MALTOPHILIA FEW STAPHYLOCOCCUS AUREUS    Report Status 08/11/2019 FINAL  Final   Organism ID, Bacteria STENOTROPHOMONAS MALTOPHILIA  Final   Organism ID, Bacteria STAPHYLOCOCCUS AUREUS  Final      Susceptibility   Staphylococcus aureus - MIC*    CIPROFLOXACIN <=0.5 SENSITIVE Sensitive     ERYTHROMYCIN <=0.25 SENSITIVE Sensitive     GENTAMICIN <=0.5 SENSITIVE Sensitive     OXACILLIN 0.5 SENSITIVE Sensitive     TETRACYCLINE <=1 SENSITIVE Sensitive     VANCOMYCIN 1 SENSITIVE Sensitive     TRIMETH/SULFA <=10 SENSITIVE Sensitive     CLINDAMYCIN <=0.25 SENSITIVE Sensitive     RIFAMPIN <=0.5 SENSITIVE Sensitive     Inducible  Clindamycin NEGATIVE Sensitive     * FEW STAPHYLOCOCCUS AUREUS   Stenotrophomonas maltophilia - MIC*    LEVOFLOXACIN 0.5 SENSITIVE Sensitive     TRIMETH/SULFA <=20 SENSITIVE Sensitive     * FEW STENOTROPHOMONAS MALTOPHILIA  Culture, respiratory     Status: None   Collection Time: 08/09/19  1:34 PM   Specimen: Bronchoalveolar Lavage; Respiratory  Result Value Ref Range Status   Specimen Description BRONCHIAL ALVEOLAR LAVAGE  Final   Special Requests NONE  Final   Gram Stain   Final    FEW WBC PRESENT,BOTH PMN AND MONONUCLEAR NO ORGANISMS SEEN NO SQUAMOUS EPITHELIAL CELLS PRESENT Performed at [facility], [address], [place], [place] [address]    Culture FEW STENOTROPHOMONAS MALTOPHILIA  Final   Report Status 08/12/2019 FINAL  Final   Organism ID, Bacteria STENOTROPHOMONAS MALTOPHILIA  Final      Susceptibility   Stenotrophomonas maltophilia - MIC*    LEVOFLOXACIN 0.5 SENSITIVE Sensitive     TRIMETH/SULFA <=20 SENSITIVE Sensitive     * FEW STENOTROPHOMONAS MALTOPHILIA  Fungus Culture With Stain     Status: None (Preliminary result)   Collection Time: 08/09/19  1:34 PM   Specimen: Bronchial Alveolar Lavage  Result Value Ref Range Status   Fungus Stain Final report  Final    Comment: (NOTE) Performed At: [facility] [place], [place] [id number] Rush Farmer MD [id number]    Fungus (Mycology) Culture PENDING  Incomplete   Fungal Source BRONCHIAL ALVEOLAR LAVAGE  Final    Comment: Performed at [facility], [place] [address] [place], [place] [address]  Fungus Culture Result     Status: None   Collection Time: 08/09/19  1:34 PM  Result Value Ref Range Status   Result 1 Comment  Final    Comment: (NOTE) KOH/Calcofluor preparation:  no fungus observed. Performed At: [facility] [place], [place] [id number] Rush Farmer MD ZT:[id number]      Labs: BNP (last 3 results) Recent Labs    08/01/19 1403  08/05/19 0839 08/09/19 0558  BNP 172.0* 265.0* 825.0*   Basic Metabolic Panel: Recent Labs  Lab 08/07/19 1141 08/07/19 1401 08/09/19 0558 08/10/19 1135 08/11/19 0227 08/11/19 0329 08/11/19 0351 08/12/19 0229 08/13/19 0430  NA 140 141 141 140 140 138 139 140 136  K 4.7 4.0 3.9 4.8 4.5 4.2 4.4 4.7 4.8  CL 93* 94* 98 98 96*  --   --  96* 96*  CO2 36* 34* 33* 29 31  --   --  30 27  GLUCOSE 90 122* 88 246* 253*  --   --  144* 249*  BUN 27* 29* 45* 65* 72*  --   --  70* 56*  CREATININE 0.71 1.00 1.28* 1.37* 1.26*  --   --  1.45* 1.55*  CALCIUM 8.7* 8.9 8.5* 8.5* 8.6*  --   --  8.8* 8.9  MG 2.2 2.6* 2.3  --  2.5*  --   --   --  2.4  PHOS  --   --  2.4* 4.9* 5.7*  --   --  5.4* 3.9   Liver Function Tests: Recent Labs  Lab 08/08/19 0502 08/10/19 1135 08/11/19 0227 08/12/19 0229  AST 23  --  20 14*  ALT 22  --  14 14  ALKPHOS 63  --  52 56  BILITOT 1.0  --  0.5 0.5  PROT 5.6*  --  6.4* 6.6  ALBUMIN 2.5* 2.7* 2.6* 2.9*   No results for input(s): LIPASE, AMYLASE in the last 168 hours. No results for input(s): AMMONIA in the last 168 hours. CBC: Recent Labs  Lab 08/09/19 0558 08/10/19 1135 08/11/19 0227 08/11/19 0329 08/11/19 0351 08/12/19 0229 08/13/19 0430  WBC 21.8*  22.0* 45.4* 10.3  --   --  20.9* 17.5*  NEUTROABS 1.0* 1.5*  --   --   --  2.5 2.5  HGB 7.4*  7.6* 8.3* 8.7* 9.5* 8.2* 8.6* 9.1*  HCT 23.0*  23.4* 25.8* 26.3* 28.0* 24.0* 26.8* 28.4*  MCV 92.7  94.4 92.8 89.2  --   --  90.5 90.7  PLT 26*  25* 27* 20*  --   --  21* 26*   Cardiac Enzymes: No results for input(s): CKTOTAL, CKMB, CKMBINDEX, TROPONINI in the last 168 hours. BNP: Invalid input(s): POCBNP CBG: Recent Labs  Lab 08/12/19 1208 08/12/19 1548 08/12/19 2054 08/13/19 0616 08/13/19 1144  GLUCAP 215* 229* 326* 226* 167*   D-Dimer No results for input(s): DDIMER in the last 72 hours. Hgb  A1c No results for input(s): HGBA1C in the last 72 hours. Lipid Profile No results for  input(s): CHOL, HDL, LDLCALC, TRIG, CHOLHDL, LDLDIRECT in the last 72 hours. Thyroid function studies No results for input(s): TSH, T4TOTAL, T3FREE, THYROIDAB in the last 72 hours.  Invalid input(s): FREET3 Anemia work up No results for input(s): VITAMINB12, FOLATE, FERRITIN, TIBC, IRON, RETICCTPCT in the last 72 hours. Urinalysis    Component Value Date/Time   COLORURINE YELLOW 08/07/2019 1429   APPEARANCEUR CLEAR 08/07/2019 1429   APPEARANCEUR Clear 07/05/2012 0103   LABSPEC 1.023 08/07/2019 1429   LABSPEC 1.033 07/05/2012 0103   PHURINE 5.0 08/07/2019 1429   GLUCOSEU NEGATIVE 08/07/2019 1429   GLUCOSEU >=500 07/05/2012 0103   HGBUR SMALL (A) 08/07/2019 1429   BILIRUBINUR NEGATIVE 08/07/2019 1429   BILIRUBINUR Negative 07/05/2012 0103   KETONESUR 5 (A) 08/07/2019 1429   PROTEINUR 30 (A) 08/07/2019 1429   NITRITE NEGATIVE 08/07/2019 1429   LEUKOCYTESUR NEGATIVE 08/07/2019 1429   LEUKOCYTESUR Negative 07/05/2012 0103   Sepsis Labs Invalid input(s): PROCALCITONIN,  WBC,  LACTICIDVEN Microbiology Recent Results (from the past 240 hour(s))  Respiratory Panel by RT PCR (Flu A&B, Covid) - Nasopharyngeal Swab     Status: None   Collection Time: 08/05/19 11:50 AM   Specimen: Nasopharyngeal Swab  Result Value Ref Range Status   SARS Coronavirus 2 by RT PCR NEGATIVE NEGATIVE Final    Comment: (NOTE) SARS-CoV-2 target nucleic acids are NOT DETECTED. The SARS-CoV-2 RNA is generally detectable in upper respiratoy specimens during the acute phase of infection. The lowest concentration of SARS-CoV-2 viral copies this assay can detect is 131 copies/mL. A negative result does not preclude SARS-Cov-2 infection and should not be used as the sole basis for treatment or other patient management decisions. A negative result may occur with  improper specimen collection/handling, submission of specimen other than nasopharyngeal swab, presence of viral mutation(s) within the areas targeted by  this assay, and inadequate number of viral copies (<131 copies/mL). A negative result must be combined with clinical observations, patient history, and epidemiological information. The expected result is Negative. Fact Sheet for Patients:  PinkCheek.be Fact Sheet for Healthcare Providers:  GravelBags.it This test is not yet ap proved or cleared by the [place] FDA and  has been authorized for detection and/or diagnosis of SARS-CoV-2 by FDA under an Emergency Use Authorization (EUA). This EUA will remain  in effect (meaning this test can be used) for the duration of the COVID-19 declaration under Section 564(b)(1) of the Act, 21 U.S.C. section 360bbb-3(b)(1), unless the authorization is terminated or revoked sooner.    Influenza A by PCR NEGATIVE NEGATIVE Final   Influenza B by PCR NEGATIVE NEGATIVE Final    Comment: (NOTE) The Xpert Xpress SARS-CoV-2/FLU/RSV assay is intended as an aid in  the diagnosis of influenza from Nasopharyngeal swab specimens and  should not be used as a sole basis for treatment. Nasal washings and  aspirates are unacceptable for Xpert Xpress SARS-CoV-2/FLU/RSV  testing. Fact Sheet for Patients: PinkCheek.be Fact Sheet for Healthcare Providers: GravelBags.it This test is not yet approved or cleared by the [place] FDA and  has been authorized for detection and/or diagnosis of SARS-CoV-2 by  FDA under an Emergency Use Authorization (EUA). This EUA will remain  in effect (meaning this test can be used) for the duration of the  Covid-19 declaration under Section 564(b)(1) of the Act, 21  U.S.C. section 360bbb-3(b)(1), unless the authorization is  terminated or revoked. Performed at  [facility], [place] [address], [place], [place] [address]   MRSA PCR Screening     Status: None   Collection Time: 08/05/19 12:15 PM   Specimen:  Nasal Mucosa; Nasopharyngeal  Result Value Ref Range Status   MRSA by PCR NEGATIVE NEGATIVE Final    Comment:        The GeneXpert MRSA Assay (FDA approved for NASAL specimens only), is one component of a comprehensive MRSA colonization surveillance program. It is not intended to diagnose MRSA infection nor to guide or monitor treatment for MRSA infections. Performed at [facility], [address], [place], [place] [address]   Culture, respiratory (non-expectorated)     Status: None   Collection Time: 08/08/19 12:29 AM   Specimen: Tracheal Aspirate; Respiratory  Result Value Ref Range Status   Specimen Description TRACHEAL ASPIRATE  Final   Special Requests NONE  Final   Gram Stain   Final    RARE WBC PRESENT,BOTH PMN AND MONONUCLEAR NO ORGANISMS SEEN Performed at [facility], [address] [place], [place] [address]    Culture   Final    FEW STENOTROPHOMONAS MALTOPHILIA FEW STAPHYLOCOCCUS AUREUS    Report Status 08/11/2019 FINAL  Final   Organism ID, Bacteria STENOTROPHOMONAS MALTOPHILIA  Final   Organism ID, Bacteria STAPHYLOCOCCUS AUREUS  Final      Susceptibility   Staphylococcus aureus - MIC*    CIPROFLOXACIN <=0.5 SENSITIVE Sensitive     ERYTHROMYCIN <=0.25 SENSITIVE Sensitive     GENTAMICIN <=0.5 SENSITIVE Sensitive     OXACILLIN 0.5 SENSITIVE Sensitive     TETRACYCLINE <=1 SENSITIVE Sensitive     VANCOMYCIN 1 SENSITIVE Sensitive     TRIMETH/SULFA <=10 SENSITIVE Sensitive     CLINDAMYCIN <=0.25 SENSITIVE Sensitive     RIFAMPIN <=0.5 SENSITIVE Sensitive     Inducible Clindamycin NEGATIVE Sensitive     * FEW STAPHYLOCOCCUS AUREUS   Stenotrophomonas maltophilia - MIC*    LEVOFLOXACIN 0.5 SENSITIVE Sensitive     TRIMETH/SULFA <=20 SENSITIVE Sensitive     * FEW STENOTROPHOMONAS MALTOPHILIA  Culture, respiratory     Status: None   Collection Time: 08/09/19  1:34 PM   Specimen: Bronchoalveolar Lavage; Respiratory  Result Value Ref Range Status   Specimen  Description BRONCHIAL ALVEOLAR LAVAGE  Final   Special Requests NONE  Final   Gram Stain   Final    FEW WBC PRESENT,BOTH PMN AND MONONUCLEAR NO ORGANISMS SEEN NO SQUAMOUS EPITHELIAL CELLS PRESENT Performed at [facility], [address], [place], [place] [address]    Culture FEW STENOTROPHOMONAS MALTOPHILIA  Final   Report Status 08/12/2019 FINAL  Final   Organism ID, Bacteria STENOTROPHOMONAS MALTOPHILIA  Final      Susceptibility   Stenotrophomonas maltophilia - MIC*    LEVOFLOXACIN 0.5 SENSITIVE Sensitive     TRIMETH/SULFA <=20 SENSITIVE Sensitive     * FEW STENOTROPHOMONAS MALTOPHILIA  Fungus Culture With Stain     Status: None (Preliminary result)   Collection Time: 08/09/19  1:34 PM   Specimen: Bronchial Alveolar Lavage  Result Value Ref Range Status   Fungus Stain Final report  Final    Comment: (NOTE) Performed At: [facility] [place], [place] [id number] Rush Farmer MD [id number]    Fungus (Mycology) Culture PENDING  Incomplete   Fungal Source BRONCHIAL ALVEOLAR LAVAGE  Final    Comment: Performed at [facility], [place] [address] [place], [place] [address]  Fungus Culture  Result     Status: None   Collection Time: 08/09/19  1:34 PM  Result Value Ref Range Status   Result 1 Comment  Final    Comment: (NOTE) KOH/Calcofluor preparation:  no fungus observed. Performed At: [facility] [place], [place] [id number] Rush Farmer MD WU:[id number]      Time coordinating discharge: Over 30 minutes  SIGNED:   Donnamarie Poag [place] DO  [facility] 08/13/2019, 12:55 PM

## 2019-08-13 NOTE — Progress Notes (Addendum)
Per CCMD, patient's heart rate went to 140's sustained. NP and PT currently at bedside, patient just finished working with PT. Patient denies chest pain or shortness of breath, heart rate currently 114. [place] paged.

## 2019-08-13 NOTE — Progress Notes (Addendum)
Discharge education and medication education given to patient with teach back. Education on when to call MD, increasing activity slowly, and diet given.  All questions and concerns answered. Peripheral IV and telemetry leads removed. All patient belongings given to patient. Scales delivered to patients room. Patient transported to main entrance by nurse tech.

## 2019-08-13 NOTE — Procedures (Signed)
Interventional Radiology Procedure Note  Procedure: CT guided bone marrow aspiration and biopsy  Complications: None  EBL: < 10 mL  Findings: Aspirate and core biopsy performed of bone marrow in right iliac bone.  Plan: Bedrest supine x 1 hrs  Mila Pair T. Marcoantonio Legault, M.D Pager:  [phone]   

## 2019-08-13 NOTE — Progress Notes (Signed)
   Vital Signs MEWS/VS Documentation      08/13/2019 1000 08/13/2019 1005 08/13/2019 1128 08/13/2019 1138   MEWS Score:  0  0  2  2   MEWS Score Color:  Green  Green  Yellow  Yellow   Resp:  --  18  --  17   Pulse:  --  95  (!) 114  (!) 118   BP:  --  --  --  138/79   O2 Device:  --  Nasal Cannula  --  Nasal Cannula   O2 Flow Rate (L/min):  --  2 L/min  --  2 L/min   FiO2 (%):  --  100 %  --  --   Level of Consciousness:  Alert  --  --  --        Nonacute change.   Mileigh Tilley L Dawnetta Copenhaver 08/13/2019,11:39 AM

## 2019-08-14 LAB — CBC WITH DIFFERENTIAL/PLATELET
Abs Immature Granulocytes: 0.09 10*3/uL — ABNORMAL HIGH (ref 0.00–0.07)
Basophils Absolute: 0 10*3/uL (ref 0.0–0.1)
Basophils Relative: 0 %
Eosinophils Absolute: 0.1 10*3/uL (ref 0.0–0.5)
Eosinophils Relative: 0 %
HCT: 28.4 % — ABNORMAL LOW (ref 39.0–52.0)
Hemoglobin: 9.1 g/dL — ABNORMAL LOW (ref 13.0–17.0)
Immature Granulocytes: 1 %
Lymphocytes Relative: 85 %
Lymphs Abs: 14.8 10*3/uL — ABNORMAL HIGH (ref 0.7–4.0)
MCH: 29.1 pg (ref 26.0–34.0)
MCHC: 32 g/dL (ref 30.0–36.0)
MCV: 90.7 fL (ref 80.0–100.0)
Monocytes Absolute: 0.1 10*3/uL (ref 0.1–1.0)
Monocytes Relative: 0 %
Neutro Abs: 2.5 10*3/uL (ref 1.7–7.7)
Neutrophils Relative %: 14 %
Platelets: 26 10*3/uL — CL (ref 150–400)
RBC: 3.13 MIL/uL — ABNORMAL LOW (ref 4.22–5.81)
RDW: 16.9 % — ABNORMAL HIGH (ref 11.5–15.5)
WBC: 17.5 10*3/uL — ABNORMAL HIGH (ref 4.0–10.5)
nRBC: 0.7 % — ABNORMAL HIGH (ref 0.0–0.2)

## 2019-08-16 LAB — SURGICAL PATHOLOGY

## 2019-08-17 ENCOUNTER — Other Ambulatory Visit: Payer: Self-pay | Admitting: *Deleted

## 2019-08-17 LAB — SURGICAL PATHOLOGY

## 2019-08-18 ENCOUNTER — Inpatient Hospital Stay: Payer: Medicare Other

## 2019-08-18 ENCOUNTER — Inpatient Hospital Stay: Payer: Medicare Other | Attending: Physician Assistant

## 2019-08-18 ENCOUNTER — Inpatient Hospital Stay (HOSPITAL_BASED_OUTPATIENT_CLINIC_OR_DEPARTMENT_OTHER): Payer: Medicare Other | Admitting: Physician Assistant

## 2019-08-18 ENCOUNTER — Other Ambulatory Visit: Payer: Self-pay

## 2019-08-18 ENCOUNTER — Telehealth: Payer: Self-pay

## 2019-08-18 VITALS — BP 128/57 | HR 77 | Temp 98.5°F | Resp 16

## 2019-08-18 VITALS — BP 176/81 | HR 118 | Temp 98.5°F | Resp 18 | Ht 70.0 in | Wt 205.7 lb

## 2019-08-18 DIAGNOSIS — Z79899 Other long term (current) drug therapy: Secondary | ICD-10-CM | POA: Insufficient documentation

## 2019-08-18 DIAGNOSIS — Z7952 Long term (current) use of systemic steroids: Secondary | ICD-10-CM | POA: Insufficient documentation

## 2019-08-18 DIAGNOSIS — E1165 Type 2 diabetes mellitus with hyperglycemia: Secondary | ICD-10-CM | POA: Diagnosis not present

## 2019-08-18 DIAGNOSIS — E875 Hyperkalemia: Secondary | ICD-10-CM

## 2019-08-18 DIAGNOSIS — Z5112 Encounter for antineoplastic immunotherapy: Secondary | ICD-10-CM | POA: Insufficient documentation

## 2019-08-18 DIAGNOSIS — D519 Vitamin B12 deficiency anemia, unspecified: Secondary | ICD-10-CM

## 2019-08-18 DIAGNOSIS — D649 Anemia, unspecified: Secondary | ICD-10-CM | POA: Diagnosis not present

## 2019-08-18 DIAGNOSIS — C9110 Chronic lymphocytic leukemia of B-cell type not having achieved remission: Secondary | ICD-10-CM | POA: Insufficient documentation

## 2019-08-18 DIAGNOSIS — Z7951 Long term (current) use of inhaled steroids: Secondary | ICD-10-CM | POA: Diagnosis not present

## 2019-08-18 DIAGNOSIS — D696 Thrombocytopenia, unspecified: Secondary | ICD-10-CM

## 2019-08-18 DIAGNOSIS — Z7982 Long term (current) use of aspirin: Secondary | ICD-10-CM | POA: Insufficient documentation

## 2019-08-18 DIAGNOSIS — I10 Essential (primary) hypertension: Secondary | ICD-10-CM

## 2019-08-18 DIAGNOSIS — Z794 Long term (current) use of insulin: Secondary | ICD-10-CM | POA: Diagnosis not present

## 2019-08-18 DIAGNOSIS — Z87891 Personal history of nicotine dependence: Secondary | ICD-10-CM | POA: Diagnosis not present

## 2019-08-18 DIAGNOSIS — Z7189 Other specified counseling: Secondary | ICD-10-CM | POA: Diagnosis not present

## 2019-08-18 LAB — VITAMIN B12: Vitamin B-12: 1326 pg/mL — ABNORMAL HIGH (ref 180–914)

## 2019-08-18 LAB — CBC WITH DIFFERENTIAL (CANCER CENTER ONLY)
Abs Immature Granulocytes: 0.06 10*3/uL (ref 0.00–0.07)
Basophils Absolute: 0 10*3/uL (ref 0.0–0.1)
Basophils Relative: 0 %
Eosinophils Absolute: 0 10*3/uL (ref 0.0–0.5)
Eosinophils Relative: 0 %
HCT: 29.4 % — ABNORMAL LOW (ref 39.0–52.0)
Hemoglobin: 9.9 g/dL — ABNORMAL LOW (ref 13.0–17.0)
Immature Granulocytes: 1 %
Lymphocytes Relative: 49 %
Lymphs Abs: 2.5 10*3/uL (ref 0.7–4.0)
MCH: 29.3 pg (ref 26.0–34.0)
MCHC: 33.7 g/dL (ref 30.0–36.0)
MCV: 87 fL (ref 80.0–100.0)
Monocytes Absolute: 0.1 10*3/uL (ref 0.1–1.0)
Monocytes Relative: 1 %
Neutro Abs: 2.6 10*3/uL (ref 1.7–7.7)
Neutrophils Relative %: 49 %
Platelet Count: 113 10*3/uL — ABNORMAL LOW (ref 150–400)
RBC: 3.38 MIL/uL — ABNORMAL LOW (ref 4.22–5.81)
RDW: 17.2 % — ABNORMAL HIGH (ref 11.5–15.5)
WBC Count: 5.2 10*3/uL (ref 4.0–10.5)
nRBC: 5.2 % — ABNORMAL HIGH (ref 0.0–0.2)

## 2019-08-18 LAB — CMP (CANCER CENTER ONLY)
ALT: 39 U/L (ref 0–44)
AST: 14 U/L — ABNORMAL LOW (ref 15–41)
Albumin: 3.8 g/dL (ref 3.5–5.0)
Alkaline Phosphatase: 95 U/L (ref 38–126)
Anion gap: 9 (ref 5–15)
BUN: 40 mg/dL — ABNORMAL HIGH (ref 6–20)
CO2: 22 mmol/L (ref 22–32)
Calcium: 9.8 mg/dL (ref 8.9–10.3)
Chloride: 100 mmol/L (ref 98–111)
Creatinine: 1.39 mg/dL — ABNORMAL HIGH (ref 0.61–1.24)
GFR, Est AFR Am: >60 mL/min
GFR, Estimated: 59 mL/min — ABNORMAL LOW
Glucose, Bld: 262 mg/dL — ABNORMAL HIGH (ref 70–99)
Potassium: 5.9 mmol/L — ABNORMAL HIGH (ref 3.5–5.1)
Sodium: 131 mmol/L — ABNORMAL LOW (ref 135–145)
Total Bilirubin: 0.5 mg/dL (ref 0.3–1.2)
Total Protein: 7.3 g/dL (ref 6.5–8.1)

## 2019-08-18 LAB — LACTATE DEHYDROGENASE: LDH: 220 U/L — ABNORMAL HIGH (ref 98–192)

## 2019-08-18 LAB — URIC ACID: Uric Acid, Serum: 3 mg/dL — ABNORMAL LOW (ref 3.7–8.6)

## 2019-08-18 LAB — POTASSIUM: Potassium: 5.9 mmol/L — ABNORMAL HIGH (ref 3.5–5.1)

## 2019-08-18 MED ORDER — PREDNISONE 50 MG PO TABS: ORAL_TABLET | ORAL | 0 refills | Status: DC

## 2019-08-18 MED ORDER — DIPHENHYDRAMINE HCL 50 MG/ML IJ SOLN
INTRAMUSCULAR | Status: AC
  Filled 2019-08-18: qty 1

## 2019-08-18 MED ORDER — ACALABRUTINIB 100 MG PO CAPS: 100.0000 mg | ORAL_CAPSULE | Freq: Two times a day (BID) | ORAL | 2 refills | Status: DC

## 2019-08-18 MED ORDER — ACETAMINOPHEN 325 MG PO TABS
650.0000 mg | ORAL_TABLET | Freq: Once | ORAL | Status: AC
  Administered 2019-08-18: 12:00:00 650 mg via ORAL

## 2019-08-18 MED ORDER — SODIUM POLYSTYRENE SULFONATE PO POWD: Freq: Once | ORAL | 0 refills | Status: AC

## 2019-08-18 MED ORDER — SODIUM CHLORIDE 0.9 % IV SOLN
1000.0000 mg | Freq: Once | INTRAVENOUS | Status: AC
  Administered 2019-08-18: 13:00:00 1000 mg via INTRAVENOUS
  Filled 2019-08-18: qty 40

## 2019-08-18 MED ORDER — ACETAMINOPHEN 325 MG PO TABS
ORAL_TABLET | ORAL | Status: AC
  Filled 2019-08-18: qty 2

## 2019-08-18 MED ORDER — SODIUM CHLORIDE 0.9 % IV SOLN
20.0000 mg | Freq: Once | INTRAVENOUS | Status: AC
  Administered 2019-08-18: 20 mg via INTRAVENOUS
  Filled 2019-08-18: qty 20

## 2019-08-18 MED ORDER — DIPHENHYDRAMINE HCL 50 MG/ML IJ SOLN
50.0000 mg | Freq: Once | INTRAMUSCULAR | Status: AC
  Administered 2019-08-18: 12:00:00 50 mg via INTRAVENOUS

## 2019-08-18 MED ORDER — SODIUM CHLORIDE 0.9 % IV SOLN
Freq: Once | INTRAVENOUS | Status: AC
  Filled 2019-08-18: qty 250

## 2019-08-18 NOTE — Patient Instructions (Addendum)
[facility] Discharge Instructions for Patients Receiving Chemotherapy  YOUR  POTASSIUM LEVEL IS HIGH.   DO NOT EAT BANANAS, DO NOT DRINK ORANGE JUICE FOR A FEW DAYS.  WHEN RESUMING THESE FOODS, EAT AND DRINK THESE PRODUCTS IN MODERATE AMOUNTS -  (  THESE FOODS ARE HIGH IN POTASSIUM ). PICK UP MEDICATION  KAYEXALATE FROM THE PHARMACY AND START TAKING IT TODAY.  MAY CAUSE DIARRHEA.   Today you received the following chemotherapy agents :  Obinutuzumab.  To help prevent nausea and vomiting after your treatment, we encourage you to take your nausea medication as prescribed.   If you develop nausea and vomiting that is not controlled by your nausea medication, call the clinic.   BELOW ARE SYMPTOMS THAT SHOULD BE REPORTED IMMEDIATELY:  *FEVER GREATER THAN 100.5 F  *CHILLS WITH OR WITHOUT FEVER  NAUSEA AND VOMITING THAT IS NOT CONTROLLED WITH YOUR NAUSEA MEDICATION  *UNUSUAL SHORTNESS OF BREATH  *UNUSUAL BRUISING OR BLEEDING  TENDERNESS IN MOUTH AND THROAT WITH OR WITHOUT PRESENCE OF ULCERS  *URINARY PROBLEMS  *BOWEL PROBLEMS  UNUSUAL RASH Items with * indicate a potential emergency and should be followed up as soon as possible.  Feel free to call the clinic should you have any questions or concerns. The clinic phone number is [phone].  Please show the [facility] at check-in to the Emergency Department and triage nurse.   Obinutuzumab injection What is this medicine? OBINUTUZUMAB (OH bi nue TOOZ ue mab) is a monoclonal antibody. It is used to treat chronic lymphocytic leukemia (CLL) and a type of non-Hodgkin lymphoma (NHL), follicular lymphoma. This medicine may be used for other purposes; ask your health care provider or pharmacist if you have questions. COMMON BRAND NAME(S): GAZYVA What should I tell my health care provider before I take this medicine? They need to know if you have any of these conditions: infection (especially a virus infection  such as hepatitis B virus) lung or breathing disease heart disease take medicines that treat or prevent blood clots an unusual or allergic reaction to obinutuzumab, other medicines, foods, dyes, or preservatives pregnant or trying to get pregnant breast-feeding How should I use this medicine? This medicine is for infusion into a vein. It is given by a health care professional in a hospital or clinic setting. Talk to your pediatrician regarding the use of this medicine in children. Special care may be needed. Overdosage: If you think you have taken too much of this medicine contact a poison control center or emergency room at once. NOTE: This medicine is only for you. Do not share this medicine with others. What if I miss a dose? Keep appointments for follow-up doses as directed. It is important not to miss your dose. Call your doctor or health care professional if you are unable to keep an appointment. What may interact with this medicine? live virus vaccines This list may not describe all possible interactions. Give your health care provider a list of all the medicines, herbs, non-prescription drugs, or dietary supplements you use. Also tell them if you smoke, drink alcohol, or use illegal drugs. Some items may interact with your medicine. What should I watch for while using this medicine? Report any side effects that you notice during your treatment right away, such as changes in your breathing, fever, chills, dizziness or lightheadedness. These effects are more common with the first dose. Visit your prescriber or health care professional for checks on your progress. You will need to  have regular blood work. Report any other side effects. The side effects of this medicine can continue after you finish your treatment. Continue your course of treatment even though you feel ill unless your doctor tells you to stop. Call your doctor or health care professional for advice if you get a fever, chills  or sore throat, or other symptoms of a cold or flu. Do not treat yourself. This drug decreases your body's ability to fight infections. Try to avoid being around people who are sick. This medicine may increase your risk to bruise or bleed. Call your doctor or health care professional if you notice any unusual bleeding. Do not become pregnant while taking this medicine or for 6 months after stopping it. Women should inform their doctor if they wish to become pregnant or think they might be pregnant. There is a potential for serious side effects to an unborn child. Talk to your health care professional or pharmacist for more information. Do not breast-feed an infant while taking this medicine or for 6 months after stopping it. What side effects may I notice from receiving this medicine? Side effects that you should report to your doctor or health care professional as soon as possible: allergic reactions like skin rash, itching or hives, swelling of the face, lips, or tongue breathing problems changes in vision chest pain or chest tightness confusion dizziness loss of balance or coordination low blood counts - this medicine may decrease the number of white blood cells, red blood cells and platelets. You may be at increased risk for infections and bleeding. signs of decreased platelets or bleeding - bruising, pinpoint red spots on the skin, black, tarry stools, blood in the urine signs of infection - fever or chills, cough, sore throat, pain or trouble passing urine signs and symptoms of liver injury like dark yellow or brown urine; general ill feeling or flu-like symptoms; light-colored stools; loss of appetite; nausea; right upper belly pain; unusually weak or tired; yellowing of the eyes or skin trouble speaking or understanding trouble walking vomiting Side effects that usually do not require medical attention (report to your doctor or health care professional if they continue or are  bothersome): constipation joint pain muscle pain This list may not describe all possible side effects. Call your doctor for medical advice about side effects. You may report side effects to FDA at 1-800-FDA-1088. Where should I keep my medicine? This drug is only given in a hospital or clinic and will not be stored at home. NOTE: This sheet is a summary. It may not cover all possible information. If you have questions about this medicine, talk to your doctor, pharmacist, or health care provider.  2020 [employer] (2018-11-24 15:34:53)

## 2019-08-18 NOTE — Progress Notes (Signed)
[facility] OFFICE PROGRESS NOTE  Larry Coffin, MD [place] [address]  DIAGNOSIS: CLL/small lymphocytic lymphoma diagnosed in 2010 and has been in observation for several years with no treatment.  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  The cytogenetics were negative for P 17.   PRIOR THERAPY: None  CURRENT THERAPY: Obinutuzumab 1,000 mg IV. First dose on 08/10/2019. Status post day 1 cycle 1. He will also begin acalabrutinib 100 mg BID starting on 08/19/2019  INTERVAL HISTORY: Larry Lam 48 y.o. male returns to the clinic for a follow-up visit. The patient was recently admitted to the hospital from 08/05/2019-08/13/2019 for acute respiratory failure. He received his first dose of Obinutuzumab for his CLL while in the hospital. Since being discharged he is feeling near his baseline.  He is on chronic oxygen via nasal cannula secondary to his COPD.  He is reporting his "pretty much normal" baseline shortness of breath and productive cough.  He is reporting some chest soreness secondary to having chest compressions/CPR performed on him while he was hospitalized. He has been taking Tylenol for his chest soreness.  He denies any hemoptysis.  He denies any recent fever, chills, night sweats.  He has lost a substantial amount of weight over the last few months.  He denies any nausea, vomiting, diarrhea, constipation, or abdominal pain.  He denies any abnormal bleeding or bruising at this time including epistaxis, gingival bleeding, melena, hematuria, or hematochezia.  The patient has been experiencing some hematuria and tinged sputum while hospitalized which he attributes to being intubated and possibly from having a foley catheter.  The patient is currently on his high-dose prednisone taper for his thrombocytopenia.  He is currently taking 100 mg daily.  He is scheduled to have his initial staging PET scan  performed next week on 08/25/2019.  The patient is here today for evaluation for starting day 8 cycle 1.   MEDICAL HISTORY: Past Medical History:  Diagnosis Date  . Asthma   . COPD (chronic obstructive pulmonary disease) ([place])   . Diabetes mellitus without complication ([place])   . Hypertension   . Seizures ([place])    3-4 years ago. Single episode.  . Tobacco use     ALLERGIES:  is allergic to amlodipine besylate-valsartan and hydrochlorothiazide.  MEDICATIONS:  Current Outpatient Medications  Medication Sig Dispense Refill  . albuterol (PROVENTIL HFA;VENTOLIN HFA) 108 (90 Base) MCG/ACT inhaler Inhale 2 puffs into the lungs every 6 (six) hours as needed for wheezing or shortness of breath. 1 Inhaler 0  . allopurinol (ZYLOPRIM) 100 MG tablet Take 1 tablet (100 mg total) by mouth 2 (two) times daily. 60 tablet 0  . ASPIRIN 81 PO Take 81 mg by mouth.    Marland Kitchen atorvastatin (LIPITOR) 40 MG tablet Take 1 tablet by mouth daily.    Marland Kitchen escitalopram (LEXAPRO) 20 MG tablet Take 20 mg by mouth daily.    . Fluticasone-Salmeterol (ADVAIR) 250-50 MCG/DOSE AEPB Inhale into the lungs.    . Insulin Detemir (LEVEMIR) 100 UNIT/ML Pen Inject 30 Units into the skin daily. 18 mL 0  . Insulin Pen Needle (PEN NEEDLES 3/16") 31G X 5 MM MISC Use as directed with insulin pen 100 each 0  . ipratropium-albuterol (DUONEB) 0.5-2.5 (3) MG/3ML SOLN Take 3 mLs by nebulization every 6 (six) hours as needed. J44.9 and J44.1 360 mL 0  . pantoprazole (PROTONIX) 40 MG tablet Take 1 tablet (40  mg total) by mouth daily. 30 tablet 11  . predniSONE (DELTASONE) 20 MG tablet Take 5 tablets (100 mg total) by mouth daily for 7 days. 35 tablet 0  . sulfamethoxazole-trimethoprim (BACTRIM DS) 800-160 MG tablet Take 2 tablets by mouth every 12 (twelve) hours for 5 days. 20 tablet 0  . umeclidinium bromide (INCRUSE ELLIPTA) 62.5 MCG/INH AEPB Inhale into the lungs.    . vitamin B-12 (CYANOCOBALAMIN) 1000 MCG tablet Take 1 tablet (1,000 mcg total)  by mouth daily. 90 tablet 0  . acalabrutinib (CALQUENCE) 100 MG capsule Take 1 capsule (100 mg total) by mouth 2 (two) times daily. 60 capsule 2  . sodium polystyrene (KAYEXALATE) powder Take by mouth once for 1 dose. 15 g 0   No current facility-administered medications for this visit.    SURGICAL HISTORY: No past surgical history on file.  REVIEW OF SYSTEMS:   Review of Systems  Constitutional: Positive for weight loss. Negative for appetite change, chills, fatigue, and fever. HENT: Negative for mouth sores, nosebleeds, sore throat and trouble swallowing.   Eyes: Negative for eye problems and icterus.  Respiratory: Positive for baseline shortness of breath and cough. Negative for hemoptysis  and wheezing.   Cardiovascular: Positive for chest soreness. Negative for leg swelling.  Gastrointestinal: Negative for abdominal pain, constipation, diarrhea, nausea and vomiting.  Genitourinary: Negative for bladder incontinence, difficulty urinating, dysuria, frequency and hematuria.   Musculoskeletal: Negative for back pain, gait problem, neck pain and neck stiffness.  Skin: Negative for itching and rash.  Neurological: Negative for dizziness, extremity weakness, gait problem, headaches, light-headedness and seizures.  Hematological: Negative for adenopathy. Does not bruise/bleed easily.  Psychiatric/Behavioral: Negative for confusion, depression and sleep disturbance. The patient is not nervous/anxious.     PHYSICAL EXAMINATION:  Blood pressure (!) 176/81, pulse (!) 118, temperature 98.5 F (36.9 C), temperature source Temporal, resp. rate 18, height '5\' 10"'$  (1.778 m), weight 205 lb 11.2 oz (93.3 kg), SpO2 100 %.  ECOG PERFORMANCE STATUS: 1 - Symptomatic but completely ambulatory  Physical Exam  Constitutional: Oriented to person, place, and time and well-developed, well-nourished, and in no distress. On 2L oxygen via nasal cannula. HENT:  Head: Normocephalic and atraumatic.   Mouth/Throat: Oropharynx is clear and moist. No oropharyngeal exudate.  Eyes: Conjunctivae are normal. Right eye exhibits no discharge. Left eye exhibits no discharge. No scleral icterus.  Neck: Normal range of motion. Neck supple.  Cardiovascular: Normal rate, regular rhythm, normal heart sounds and intact distal pulses.   Pulmonary/Chest: Effort normal and breath sounds normal. No respiratory distress. No wheezes. No rales.  Abdominal: Soft. Bowel sounds are normal. Exhibits no distension and no mass. There is no tenderness.  Musculoskeletal: Normal range of motion. Exhibits no edema.  Lymphadenopathy:    No cervical adenopathy.  Neurological: Alert and oriented to person, place, and time. Exhibits normal muscle tone. Gait normal. Coordination normal.  Skin: Skin is warm and dry. No rash noted. Not diaphoretic. No erythema. No pallor.  Psychiatric: Mood, memory and judgment normal.  Vitals reviewed.  LABORATORY DATA: Lab Results  Component Value Date   WBC 5.2 08/18/2019   HGB 9.9 (L) 08/18/2019   HCT 29.4 (L) 08/18/2019   MCV 87.0 08/18/2019   PLT 113 (L) 08/18/2019      Chemistry      Component Value Date/Time   NA 131 (L) 08/18/2019 0952   NA 137 07/06/2012 0506   K 5.9 (H) 08/18/2019 1145   K 4.0 07/06/2012 0506  CL 100 08/18/2019 0952   CL 104 07/06/2012 0506   CO2 22 08/18/2019 0952   CO2 25 07/06/2012 0506   BUN 40 (H) 08/18/2019 0952   BUN 12 07/06/2012 0506   CREATININE 1.39 (H) 08/18/2019 0952   CREATININE 0.62 07/06/2012 0506      Component Value Date/Time   CALCIUM 9.8 08/18/2019 0952   CALCIUM 8.6 07/06/2012 0506   ALKPHOS 95 08/18/2019 0952   ALKPHOS 96 07/06/2012 0506   AST 14 (L) 08/18/2019 0952   ALT 39 08/18/2019 0952   ALT 30 07/06/2012 0506   BILITOT 0.5 08/18/2019 0952       RADIOGRAPHIC STUDIES:  DG Chest 1 View  Result Date: 08/09/2019 CLINICAL DATA:  ETT placement EXAM: CHEST  1 VIEW COMPARISON:  Radiographs 08/09/2019 FINDINGS:  Endotracheal tube in the mid trachea, 4.5 cm from the carina. Transesophageal tube tip beyond the GE junction curling in the left upper quadrant, terminating below the level of imaging. Diffuse interstitial pulmonary opacity throughout both lungs with few more patchy areas of opacity. Some right basilar pleural thickening compatible with fusion. Suspect at least trace left effusion as well. Cardiomediastinal contours are stable. No acute osseous or soft tissue abnormality. IMPRESSION: 1. Endotracheal tube 4.5 cm from the carina. 2. Transesophageal tube tip curls in the left upper quadrant, terminating below the level of imaging. 3. Diffuse interstitial airspace opacities compatible with infection and/or edema. Overall similar in extent to comparison studies. Electronically Signed   By: Lovena Le M.D.   On: 08/09/2019 14:40   DG Chest 2 View  Result Date: 08/02/2019 CLINICAL DATA:  Shortness of breath, recent discharge, CLL EXAM: CHEST - 2 VIEW COMPARISON:  Chest CT 04/30/2019, chest radiograph 08/01/2019 FINDINGS: Diffusely increased interstitial markings are again seen. No pneumothorax. More nodular masslike opacity seen in the left costophrenic sulcus possibly reflecting some prominent lymph nodes in the pericardial fat pad on comparison chest CT. Additional ovoid opacities are seen in the soft tissues of the axilla and base of the neck. Bilateral effusions are noted. Cardiomediastinal contours are similar to prior with nodular densities in the hila likely reflecting adenopathy. No acute osseous abnormality. Degenerative changes in the shoulders. IMPRESSION: Nonspecific increased interstitial markings with bilateral effusions. Could reflect atypical infection or edema. Nodular opacity in the left costophrenic sulcus may reflect adenopathy within the pericardial fat. Additional nodular opacities in the hila and axilla likely reflecting further adenopathy in the setting of CLL. Electronically Signed   By:  Lovena Le M.D.   On: 08/02/2019 22:13   DG Chest 2 View  Result Date: 07/31/2019 CLINICAL DATA:  Shortness of breath. EXAM: CHEST - 2 VIEW COMPARISON:  April 27, 2017 FINDINGS: Cardiomediastinal silhouette is stable. No pneumothorax. No nodules or masses. Increased lung markings bilaterally with interstitial components. There is a small left pleural effusion. IMPRESSION: 1. Increase lung markings centrally with interstitial components suggestive of atypical infection/pneumonia. Bronchitis is a possibility. There is a small associated left effusion. Electronically Signed   By: Dorise Bullion III M.D   On: 07/31/2019 02:34   CT Angio Chest PE W/Cm &/Or Wo Cm  Result Date: 08/05/2019 CLINICAL DATA:  Shortness of breath, CLL EXAM: CT ANGIOGRAPHY CHEST WITH CONTRAST TECHNIQUE: Multidetector CT imaging of the chest was performed using the standard protocol during bolus administration of intravenous contrast. Multiplanar CT image reconstructions and MIPs were obtained to evaluate the vascular anatomy. CONTRAST:  137m OMNIPAQUE IOHEXOL 350 MG/ML SOLN COMPARISON:  CT chest abdomen pelvis,  04/30/2011 FINDINGS: Cardiovascular: Satisfactory opacification of the pulmonary arteries to the segmental level. No evidence of pulmonary embolism. Normal heart size. No pericardial effusion. Mediastinum/Nodes: Numerous bulky bilateral axillary, lower cervical, mediastinal, and hilar lymph nodes, similar in size compared to CT dated 04/30/2019. Thyroid gland, trachea, and esophagus demonstrate no significant findings. Lungs/Pleura: Small bilateral pleural effusions. Diffuse bilateral interlobular septal thickening and scattered ground-glass opacities throughout the lungs. There are occasional small pulmonary nodules, which are new or enlarged when compared to prior CT. A nodule of the right pulmonary apex measures 7 mm, previously 3 mm when measured similarly (series 7, image 24). A new nodule of the right lower lobe  measures 6 mm (series 7, image 84). Upper Abdomen: No acute abnormality. There is redemonstrated bulky celiac axis and gastrohepatic ligament lymphadenopathy, again similar to prior examination dated 04/30/2019 to the extent imaged. Musculoskeletal: No chest wall abnormality. No acute or significant osseous findings. Review of the MIP images confirms the above findings. IMPRESSION: 1.  Negative examination for pulmonary embolism. 2. Small bilateral pleural effusions with diffuse bilateral interlobular septal thickening and scattered ground-glass opacity throughout the lungs. These findings are consistent with infection or edema. 3. There are occasional small pulmonary nodules, which are new or enlarged when compared to prior CT. A nodule of the right pulmonary apex measures 7 mm, previously 3 mm when measured similarly (series 7, image 24). A new nodule of the right lower lobe measures 6 mm (series 7, image 84). These are most likely infectious or inflammatory; pulmonary nodules are an unlikely manifestation of pulmonary involvement of lymphoma. 4. Numerous bulky bilateral axillary, lower cervical, mediastinal, and hilar lymph nodes, as well as bulky lymph nodes in the partially imaged upper abdomen, similar in size compared to CT dated 04/30/2019 and in keeping with diagnosis of CLL. Electronically Signed   By: Eddie Candle M.D.   On: 08/05/2019 15:26   CT ABDOMEN PELVIS W CONTRAST  Result Date: 08/06/2019 CLINICAL DATA:  CLL/SLL. EXAM: CT ABDOMEN AND PELVIS WITH CONTRAST TECHNIQUE: Multidetector CT imaging of the abdomen and pelvis was performed using the standard protocol following bolus administration of intravenous contrast. CONTRAST:  158m OMNIPAQUE IOHEXOL 300 MG/ML  SOLN COMPARISON:  04/30/2019 FINDINGS: Lower chest: There are moderate to large bilateral pleural effusions that are only partially visualized on this exam.The heart size is borderline enlarged. There are few ground-glass airspace opacities  at the lung bases with interlobular septal thickening suggestive of volume overload/developing pulmonary edema. Hepatobiliary: The liver is normal. There is layering hyperdense material within the gallbladder lumen. This may represent gallbladder sludge or vicarious excretion of contrast from the patient's prior contrast enhanced CT.There is no biliary ductal dilation. Pancreas: Normal contours without ductal dilatation. No peripancreatic fluid collection. Spleen: The spleen is significantly enlarged Adrenals/Urinary Tract: --Adrenal glands: No adrenal hemorrhage. --Right kidney/ureter: No hydronephrosis or perinephric hematoma. --Left kidney/ureter: No hydronephrosis or perinephric hematoma. --Urinary bladder: Unremarkable. Stomach/Bowel: --Stomach/Duodenum: No hiatal hernia or other gastric abnormality. Normal duodenal course and caliber. --Small bowel: No dilatation or inflammation. --Colon: No focal abnormality. --Appendix: Normal. Vascular/Lymphatic: Atherosclerotic calcification is present within the non-aneurysmal abdominal aorta, without hemodynamically significant stenosis. --there is extensive retroperitoneal adenopathy --there is extensive mesenteric adenopathy --there is extensive pelvic and inguinal adenopathy. Overall, the adenopathy appears to be relatively stable to slightly improved when compared to prior study. Reproductive: Unremarkable Other: No ascites or free air. The abdominal wall is normal. Musculoskeletal. There is developing avascular necrosis of the bilateral femoral heads, right worse  than left. There is no acute displaced fracture. IMPRESSION: 1. Moderate to large bilateral pleural effusions with adjacent atelectasis. 2. Extensive adenopathy throughout the abdomen and pelvis, similar to slightly improved when compared to September 2020 CT. 3. Splenomegaly. 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 Aortic Atherosclerosis (ICD10-I70.0). Electronically Signed   By: Constance Holster  M.D.   On: 08/06/2019 22:34   DG Chest Port 1 View  Result Date: 08/12/2019 CLINICAL DATA:  Acute respiratory failure. EXAM: PORTABLE CHEST 1 VIEW COMPARISON:  08/11/2019 FINDINGS: Endotracheal tube and nasogastric tube have been removed. Persistent interstitial densities but there is improved aeration in the right lower lung. Heart size is within normal limits. Persistent hazy densities at the lung bases and the medial aspect of the left hemidiaphragm remains obscured. Negative for a pneumothorax. Bone structures are unremarkable. IMPRESSION: 1. Slightly improved aeration in the lungs. Findings may represent decreasing pulmonary edema. 2. Persistent hazy densities at the lung base are suggestive for atelectasis and probable pleural effusions. Suspect focal volume loss or consolidation at the medial left lung base. Electronically Signed   By: Markus Daft M.D.   On: 08/12/2019 08:15   DG Chest Port 1 View  Result Date: 08/11/2019 CLINICAL DATA:  ETT, respiratory failure EXAM: PORTABLE CHEST 1 VIEW COMPARISON:  08/10/2019 FINDINGS: Interval increase in heterogeneous opacity of the right lung base and a probable layering pleural effusion. Otherwise unchanged mild, diffuse interstitial opacity. Endotracheal tube and esophagogastric tube are unchanged. IMPRESSION: 1. Interval increase in heterogeneous opacity of the right lung base and a probable layering pleural effusion. 2. Otherwise unchanged mild, diffuse interstitial opacity, in keeping with infection or edema. 3.  Endotracheal tube and esophagogastric tube are unchanged. Electronically Signed   By: Eddie Candle M.D.   On: 08/11/2019 09:00   DG Chest Port 1 View  Result Date: 08/10/2019 CLINICAL DATA:  Respiratory failure. EXAM: PORTABLE CHEST 1 VIEW COMPARISON:  August 09, 2019. FINDINGS: Stable cardiomediastinal silhouette. Endotracheal and nasogastric tubes are unchanged in position. No pneumothorax or pleural effusion is noted. Stable bibasilar  opacities are noted concerning for edema or possibly inflammation. Bony thorax is unremarkable. IMPRESSION: Stable support apparatus. Stable bibasilar opacities are noted concerning for edema or possibly inflammation. Electronically Signed   By: Marijo Conception M.D.   On: 08/10/2019 10:29   DG Chest Port 1 View  Result Date: 08/09/2019 CLINICAL DATA:  Endotracheal tube EXAM: PORTABLE CHEST 1 VIEW COMPARISON:  08/08/2019 FINDINGS: No significant interval change in AP portable examination with endotracheal tube and esophagogastric tube in position. There remains mild, diffuse interstitial pulmonary opacity throughout, consistent with mild edema or infection. No new or focal airspace opacity. The heart and mediastinum are unremarkable. IMPRESSION: No significant interval change in diffuse interstitial opacity throughout both lungs, consistent with mild edema or infection. Electronically Signed   By: Eddie Candle M.D.   On: 08/09/2019 08:07   DG CHEST PORT 1 VIEW  Result Date: 08/08/2019 CLINICAL DATA:  48 year old male status post cardiac arrest yesterday. History of CLL and suspected recent pneumonia. Negative for COVID-19. EXAM: PORTABLE CHEST 1 VIEW COMPARISON:  08/07/2019 and earlier. FINDINGS: Portable AP semi upright view at 0531 hours. Endotracheal tube tip in good position between the level the clavicles and carina. Enteric tube courses to the abdomen, tip not included. Course and asymmetric bilateral pulmonary interstitial opacity with regressed more confluent perihilar opacity since yesterday. The ventilation now resembles that on 08/05/2019. There is no longer confluent opacity at the left  lateral lung base. No pneumothorax or pleural effusion. Paucity of bowel gas. No acute osseous abnormality identified. IMPRESSION: 1. Satisfactory lines and tubes. 2. Improved ventilation since yesterday with regressed perihilar opacity. Continued coarse interstitial opacity resembling that on 08/05/2019 which  was also demonstrated by CTA at that time. 3. No new cardiopulmonary abnormality. Electronically Signed   By: Genevie Ann M.D.   On: 08/08/2019 10:48   DG CHEST PORT 1 VIEW  Result Date: 08/07/2019 CLINICAL DATA:  Tube placement, code EXAM: PORTABLE CHEST 1 VIEW COMPARISON:  06/05/2019 FINDINGS: Interval placement of endotracheal tube, tip projecting over the mid trachea. Esophagogastric tube, poorly visualized due to underpenetration although appears to be with tip and side port below the diaphragm. There is mild, diffuse interstitial pulmonary opacity. Layering pleural effusions seen on prior CT are not well appreciated. No new airspace opacity. IMPRESSION: 1. Interval placement of endotracheal tube, tip projecting over the mid trachea. 2. Esophagogastric tube, poorly visualized due to underpenetration although appears to be with tip and side port below the diaphragm. 3. There is mild, diffuse interstitial pulmonary opacity. Layering pleural effusions seen on prior CT are not well appreciated. No new airspace opacity. Electronically Signed   By: Eddie Candle M.D.   On: 08/07/2019 14:10   DG Chest Port 1 View  Result Date: 08/05/2019 CLINICAL DATA:  PT c/o worsening SOB since last night. Pt states he is normally somewhat SOB at baseline. Hx HTN, diabetes, COPD, asthma, former smoker EXAM: PORTABLE CHEST - 1 VIEW COMPARISON:  08/02/2019 FINDINGS: Increasing patchy airspace infiltrate laterally at the left lung base. Diffuse interstitial markings throughout both lungs as before. Heart size normal. Right paratracheal soft tissue consistent with adenopathy. The aortopulmonary window is obscured suggesting adenopathy or mass. Blunting of left lateral costophrenic angle. No pneumothorax. Visualized bones unremarkable. IMPRESSION: 1. Increasing patchy airspace infiltrate laterally at the left lung base suspicious for pneumonia. 2. Probable mediastinal adenopathy. 3. Diffuse interstitial markings throughout both  lungs as before. Electronically Signed   By: Lucrezia Europe M.D.   On: 08/05/2019 09:12   DG CHEST PORT 1 VIEW  Result Date: 08/01/2019 CLINICAL DATA:  48 year old male with weakness. Acute on chronic anemia, thrombocytopenia. Chronic lymphocytic leukemia. Negative for COVID-19. Yesterday. EXAM: PORTABLE CHEST 1 VIEW COMPARISON:  Chest radiographs 07/31/2019 and earlier. FINDINGS: Portable AP upright view at 1518 hours. Stable lung volumes and mediastinal contours. Mediastinal lymphadenopathy demonstrated by CT in September likely persists. Stable mild bilateral nonspecific pulmonary interstitial markings, slightly greater on the right. No superimposed pneumothorax, pleural effusion or consolidation. Visualized tracheal air column is within normal limits. Negative visible bowel gas pattern. No acute osseous abnormality identified. IMPRESSION: 1. Persistent nonspecific pulmonary interstitial markings greater on the right. Doubt pulmonary edema. Atypical infection remains possible. But this might be related to the patient's CLL given suggestion of some nonspecific interstitial opacity by CT in September. 2. Evidence of mediastinal lymphadenopathy stable since September. 3. No new cardiopulmonary abnormality since yesterday. Electronically Signed   By: Genevie Ann M.D.   On: 08/01/2019 16:59   DG Abd Portable 1V  Result Date: 08/09/2019 CLINICAL DATA:  OG tube placement EXAM: PORTABLE ABDOMEN - 1 VIEW COMPARISON:  Concurrent chest radiograph, CT abdomen pelvis 08/06/2019 FINDINGS: Tip and side port of the transesophageal tube or distal to the GE junction, terminating in the region of the gastric antrum/duodenal bulb. Bowel gas pattern is nonobstructive. No acute soft tissue abnormality is seen. Osseous structures are unremarkable. IMPRESSION: 1. Tip of the  transesophageal tube in the region of the gastric antrum/duodenal bulb. 2. Nonobstructive bowel gas pattern. Electronically Signed   By: Lovena Le M.D.   On:  08/09/2019 14:41   CT BONE MARROW BIOPSY  Result Date: 08/13/2019 CLINICAL DATA:  History of CLL with clinical evidence of progression. Bone marrow biopsy required for further evaluation. EXAM: CT GUIDED BONE MARROW ASPIRATION AND BIOPSY ANESTHESIA/SEDATION: Versed 1.5 mg IV, Fentanyl 75 mcg IV Total Moderate Sedation Time:   18 minutes. The patient's level of consciousness and physiologic status were continuously monitored during the procedure by Radiology nursing. PROCEDURE: The procedure risks, benefits, and alternatives were explained to the patient. Questions regarding the procedure were encouraged and answered. The patient understands and consents to the procedure. A time out was performed prior to initiating the procedure. The right gluteal region was prepped with chlorhexidine. Sterile gown and sterile gloves were used for the procedure. Local anesthesia was provided with 1% Lidocaine. Under CT guidance, an 11 gauge On Control bone cutting needle was advanced from a posterior approach into the right iliac bone. Needle positioning was confirmed with CT. Initial non heparinized and heparinized aspirate samples were obtained of bone marrow. Core biopsy was performed via the On Control drill needle. COMPLICATIONS: None FINDINGS: Inspection of initial aspirate did reveal visible particles. Intact core biopsy sample was obtained. IMPRESSION: CT guided bone marrow biopsy of right posterior iliac bone with both aspirate and core samples obtained. Electronically Signed   By: Aletta Edouard M.D.   On: 08/13/2019 15:20   ECHOCARDIOGRAM COMPLETE  Result Date: 08/08/2019   ECHOCARDIOGRAM REPORT   Patient Name:   Burhanuddin NOCHUM FENTER Date of Exam: 08/08/2019 Medical Rec #:  [id number]            Height:       70.0 in Accession #:    [id number]           Weight:       223.3 lb Date of Birth:  01/08/1971            BSA:          2.19 m Patient Age:    77 years             BP:           119/61 mmHg Patient Gender:  M                    HR:           68 bpm. Exam Location:  Inpatient Procedure: 2D Echo Indications:    Cardiac arrest I46.9  History:        Patient has prior history of Echocardiogram examinations, most                 recent 04/28/2017. COPD, Signs/Symptoms:Hypotension; Risk                 Factors:Diabetes. Acute on chronic respiratory failure.  Sonographer:    Vikki Ports Turrentine Referring Phys: [id number] Francesca Jewett  Sonographer Comments: Technically difficult study due to poor echo windows and echo performed with patient supine and on artificial respirator. Image acquisition challenging due to patient body habitus. IMPRESSIONS  1. Left ventricular ejection fraction, by visual estimation, is 55 to 60%. The left ventricle has normal function. There is no left ventricular hypertrophy.  2. Left ventricular diastolic parameters are consistent with Grade I diastolic dysfunction (impaired relaxation).  3. The left ventricle has no regional wall motion  abnormalities.  4. Global right ventricle has mildly reduced systolic function.The right ventricular size is normal. No increase in right ventricular wall thickness.  5. Left atrial size was mildly dilated.  6. Right atrial size was normal.  7. Trivial pericardial effusion is present.  8. The mitral valve is grossly normal. Trivial mitral valve regurgitation.  9. The tricuspid valve is grossly normal. Tricuspid valve regurgitation is trivial. 10. The aortic valve is tricuspid. Aortic valve regurgitation is not visualized. 11. The pulmonic valve was grossly normal. Pulmonic valve regurgitation is trivial. 12. Moderately elevated pulmonary artery systolic pressure. 13. The inferior vena cava is dilated in size with <50% respiratory variability, suggesting right atrial pressure of 15 mmHg. FINDINGS  Left Ventricle: Left ventricular ejection fraction, by visual estimation, is 55 to 60%. The left ventricle has normal function. The left ventricle has no regional wall motion  abnormalities. There is no left ventricular hypertrophy. Left ventricular diastolic parameters are consistent with Grade I diastolic dysfunction (impaired relaxation). Indeterminate filling pressures. Right Ventricle: The right ventricular size is normal. No increase in right ventricular wall thickness. Global RV systolic function is has mildly reduced systolic function. The tricuspid regurgitant velocity is 2.65 m/s, and with an assumed right atrial pressure of 15 mmHg, the estimated right ventricular systolic pressure is moderately elevated at 43.1 mmHg. Left Atrium: Left atrial size was mildly dilated. Right Atrium: Right atrial size was normal in size Pericardium: Trivial pericardial effusion is present. Mitral Valve: The mitral valve is grossly normal. Trivial mitral valve regurgitation. Tricuspid Valve: The tricuspid valve is grossly normal. Tricuspid valve regurgitation is trivial. Aortic Valve: The aortic valve is tricuspid. Aortic valve regurgitation is not visualized. Pulmonic Valve: The pulmonic valve was grossly normal. Pulmonic valve regurgitation is trivial. Pulmonic regurgitation is trivial. Aorta: The aortic root and ascending aorta are structurally normal, with no evidence of dilitation. Venous: IVC assessment for right atrial pressure unable to be performed due to mechanical ventilation. The inferior vena cava is dilated in size with less than 50% respiratory variability, suggesting right atrial pressure of 15 mmHg. IAS/Shunts: No atrial level shunt detected by color flow Doppler.  LEFT VENTRICLE PLAX 2D LVIDd:         4.80 cm  Diastology LVIDs:         3.30 cm  LV e' lateral:   10.40 cm/s LV PW:         1.00 cm  LV E/e' lateral: 12.9 LV IVS:        1.00 cm  LV e' medial:    7.83 cm/s LVOT diam:     2.10 cm  LV E/e' medial:  17.1 LV SV:         63 ml LV SV Index:   27.96 LVOT Area:     3.46 cm  LEFT ATRIUM             Index       RIGHT ATRIUM           Index LA diam:        4.10 cm 1.87 cm/m  RA  Area:     16.90 cm LA Vol (A2C):   44.2 ml 20.21 ml/m RA Volume:   45.60 ml  20.85 ml/m LA Vol (A4C):   79.0 ml 36.11 ml/m LA Biplane Vol: 62.3 ml 28.48 ml/m  AORTIC VALVE LVOT Vmax:   112.00 cm/s LVOT Vmean:  82.200 cm/s LVOT VTI:    0.215 m  AORTA Ao Root diam: 3.40  cm MITRAL VALVE                         TRICUSPID VALVE MV Area (PHT): 3.54 cm              TR Peak grad:   28.1 mmHg MV PHT:        62.06 msec            TR Vmax:        265.00 cm/s MV Decel Time: 214 msec MV E velocity: 134.00 cm/s 103 cm/s  SHUNTS MV A velocity: 115.00 cm/s 70.3 cm/s Systemic VTI:  0.22 m MV E/A ratio:  1.17        1.5       Systemic Diam: 2.10 cm  Lyman Bishop MD Electronically signed by Lyman Bishop MD Signature Date/Time: 08/08/2019/11:37:42 AM    Final      ASSESSMENT/PLAN:  This is a very pleasant 48 year old Caucasian male diagnosed with CLL/small lymphocytic lymphoma diagnosed in 2010 and has been in observation for several years with no treatment.  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  The cytogenetics were negative for P 17.   The patient is currently undergoing treatment with Obinutuzumab 1,000 mg IV. He is status post day 1 cycle 1.  He tolerated well without any adverse side effects.  The patient was seen with Dr. Julien Nordmann today.  Labs were reviewed.  His CBC showed improvement of his WBCs, hemoglobin and platelets.  The patient's CMP demonstrated hyperkalemia.  The patient states that he has been eating a lot of bananas recently.  His potassium was rechecked and continued to be elevated at 5.9.  I have sent a prescription of Kayexalate to his pharmacy.  The patient was advised to decrease his dietary intake of potassium rich foods.  We will continue to monitor his labs closely.  Dr. Julien Nordmann recommends that the patient continue on the same treatment with Obinutuzumab.  He will receive day 8 of cycle 1 today as scheduled.  Dr. Julien Nordmann recommends the addition  of oral acalabrutinib 100 mg twice daily to his treatment.   The patient have his initial staging PET scan performed next week on 08/25/2019.  We will see the patient back for follow-up visit in 2 weeks for evaluation and to review his PET scan results.   The patient's blood pressure was noted to be elevated while in the clinic today.  He states that he did not take his antihypertensives as prescribed this morning.  Upon recheck his blood pressure was 146/73. He was advised to take his medications as prescribed prior to coming to his appointments.  We will begin tapering his prednisone starting on 12/25. I have sent a new prescription with the tapering instructions to his pharmacy. He will take 80 mg daily for 1 week followed by 60 mg daily for week 2, followed by 40 mg daily for week 3, followed by 20 mg for week 4.   The patient was advised to call immediately if he has any concerning symptoms in the interval. The patient voices understanding of current disease status and treatment options and is in agreement with the current care plan. All questions were answered. The patient knows to call the clinic with any problems, questions or concerns. We can certainly see the patient much sooner if necessary   Orders Placed This Encounter  Procedures  . Potassium    Standing Status:  Future    Number of Occurrences:   1    Standing Expiration Date:   08/17/2020  . CBC with Differential (Cancer Center Only)    Standing Status:   Standing    Number of Occurrences:   18    Standing Expiration Date:   08/17/2020  . CMP ([place] only)    Standing Status:   Standing    Number of Occurrences:   18    Standing Expiration Date:   08/17/2020  . Lactate dehydrogenase (LDH)    Standing Status:   Standing    Number of Occurrences:   18    Standing Expiration Date:   08/17/2020  . Uric acid    Standing Status:   Standing    Number of Occurrences:   18    Standing Expiration Date:   08/17/2020      Tobe Sos Kohler Pellerito, PA-C 08/18/19  ADDENDUM: Hematology/Oncology Attending: I had a face-to-face encounter with the patient today.  I recommended his care plan.  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  The patient was admitted to the hospital initially with acute on chronic respiratory failure/pneumonia and COPD exacerbation.  He also had respiratory/cardiac arrest on August 07, 2019 and was intubated and admitted to [facility].  He was found to have progressive CLL as well as lymphadenopathy in the chest.  He was started during his hospitalization on obinutuzumab status post 1 dose and tolerated his treatment well.  He was also started on high-dose prednisone for the significant thrombocytopenia and anemia.  He is currently on 100 mg p.o. daily. The patient is feeling much better today and he has improvement in his blood count with decreasing the total white blood count to the normal range.  He continues to have anemia but his hemoglobin and hematocrit improved compared to last week.  He also has significant improvement in his platelets count which is up to 1 13,000 today compared to 26,000 during his hospitalization. I recommended for the patient to proceed with the second dose of obinutuzumab today as planned. I also discussed with the patient treatment with acalabrutinib 100 mg p.o. twice daily. He will continue his current treatment with allopurinol for tumor lysis prophylaxis. We will taper his dose of prednisone slowly over the next few weeks. The patient will come back for follow-up visit in 2 weeks for evaluation and management of any adverse effect of his treatment. He was advised to call immediately if he has any concerning symptoms in the interval.  Disclaimer: This note was dictated with voice recognition software. Similar sounding words can  inadvertently be transcribed and may be missed upon review. Eilleen Kempf, MD 08/18/19

## 2019-08-18 NOTE — Telephone Encounter (Signed)
Oral Oncology Patient Advocate Encounter  Received notification from [employer] that prior authorization for Calquence is required.  PA submitted on CoverMyMeds Key [id number] Status is pending  Oral Oncology Clinic will continue to follow.  [place] Patient [place] Phone [phone] Fax [phone] 08/18/2019 11:23 AM

## 2019-08-18 NOTE — Telephone Encounter (Signed)
Oral Oncology Patient Advocate Encounter  Prior Authorization for Calquence has been approved.    PA# [id number] Effective dates: 05/20/19 through 08/17/22  Patients co-pay is $8.95  Oral Oncology Clinic will continue to follow.   [place] Patient [place] Phone [phone] Fax [phone] 08/18/2019 12:41 PM

## 2019-08-19 ENCOUNTER — Telehealth: Payer: Self-pay | Admitting: Pharmacist

## 2019-08-19 ENCOUNTER — Encounter: Payer: Self-pay | Admitting: Physician Assistant

## 2019-08-19 ENCOUNTER — Telehealth: Payer: Self-pay | Admitting: Physician Assistant

## 2019-08-19 ENCOUNTER — Telehealth: Payer: Self-pay | Admitting: Medical Oncology

## 2019-08-19 NOTE — Telephone Encounter (Signed)
Oral Oncology Pharmacist Encounter  Received new prescription for Calquence (acalabrutinib) for the treatment of CLL in conjunction with obinutuzumab, planned duration until disease progression or unacceptable drug toxicity.  Prescription dose and frequency assessed.   Current medication list in Epic reviewed, a few DDIs with acalabrutinib identified: -Aspirin and escitalopram: Acalabrutinib may enhance the antiplatelet effects of aspirin and escitalopram. Monitor CBC and s/sx of bleeding. - Pantoprazole: Proton Pump Inhibitors (PPI) may diminish the therapeutic effect of acalabrutinib. Recommend evaluating the need for a PPI/acid suppression. If acid suppression is needed, recommend switching to a H2 antagonist (eg, famotidine) or calcium carbonate to avoid this DDI. H2 antagonist and calcium carbonate should be separated from acalabrutinib by at least 2 hours.  Prescription has been e-scribed to the [facility] for benefits analysis and approval.  Oral Oncology Clinic will continue to follow for insurance authorization, copayment issues, initial counseling and start date.  Larry Lam, PharmD, BCPS, [facility] Hematology/Oncology Clinical Pharmacist [facility] [place] [facility] [phone]  08/19/2019 11:29 AM

## 2019-08-19 NOTE — Telephone Encounter (Signed)
Calquence capsule approved from 05/20/19-08/17/22.

## 2019-08-19 NOTE — Telephone Encounter (Signed)
Scheduled appt per 12/23 los.  MD stated she is going to send a sch message to see if the patient can get scheduled for [place] because he needs his treatment 12/31 due to it be weekly.   I called and spoke with pt and informed him of the appts I have scheduled, and informed him that he might be getting treatment in [place] next week and that someone will be calling him about that appt.  Patient was okay with and is aware of the appts I have scheduled for him.

## 2019-08-23 ENCOUNTER — Encounter (HOSPITAL_COMMUNITY): Payer: Self-pay | Admitting: Internal Medicine

## 2019-08-23 NOTE — Telephone Encounter (Signed)
Oral Chemotherapy Pharmacist Encounter  Medication will be delivered to Mr. Sandra on 08/25/2019 by [place].   Patient Education I spoke with patient for overview of new oral chemotherapy medication: Calquence (acalabrutinib) for the treatment of CLL, planned duration until disease progression or unacceptable drug toxicity.   Counseled patient on administration, dosing, side effects, monitoring, drug-food interactions, safe handling, storage, and disposal. Patient will take 1 capsule (100 mg total) by mouth 2 (two) times daily.  Reviewed PPI DDI with Mr. Witteman, detailed in previous note. Suggested the patient try to taper off his PPI starting the every other day. He knows he can use a H2 antagonist and calcium carbonate as PPI alternatives but they should be separated from acalabrutinib by at least 2 hours.   Side effects include but not limited to: decreased wbc/hgb/plt, headache, diarrhea.    Reviewed with patient importance of keeping a medication schedule and plan for any missed doses.  Mr. Oldt voiced understanding and appreciation. All questions answered. Medication handout placed in the mail.  Provided patient with [facility] [place] [facility] phone number. Patient knows to call the office with questions or concerns. Oral Chemotherapy Navigation Clinic will continue to follow.  Darl Pikes, PharmD, BCPS, [facility] Hematology/Oncology Clinical Pharmacist [facility] [place] [facility] [phone]  08/23/2019 3:34 PM

## 2019-08-25 ENCOUNTER — Other Ambulatory Visit: Payer: Self-pay

## 2019-08-25 ENCOUNTER — Ambulatory Visit (HOSPITAL_COMMUNITY)
Admission: RE | Admit: 2019-08-25 | Discharge: 2019-08-25 | Disposition: A | Discharge status: Home / Self Care | Payer: Medicare Other | Source: Ambulatory Visit | Attending: Oncology | Admitting: Oncology

## 2019-08-25 DIAGNOSIS — Z79899 Other long term (current) drug therapy: Secondary | ICD-10-CM | POA: Diagnosis not present

## 2019-08-25 DIAGNOSIS — C9110 Chronic lymphocytic leukemia of B-cell type not having achieved remission: Secondary | ICD-10-CM | POA: Diagnosis present

## 2019-08-25 DIAGNOSIS — R599 Enlarged lymph nodes, unspecified: Secondary | ICD-10-CM | POA: Insufficient documentation

## 2019-08-25 LAB — GLUCOSE, CAPILLARY: Glucose-Capillary: 197 mg/dL — ABNORMAL HIGH (ref 70–99)

## 2019-08-25 MED ORDER — FLUDEOXYGLUCOSE F - 18 (FDG) INJECTION
9.8000 | Freq: Once | INTRAVENOUS | Status: AC
  Administered 2019-08-25: 9.8 via INTRAVENOUS

## 2019-08-26 ENCOUNTER — Encounter: Payer: Self-pay | Admitting: Hematology & Oncology

## 2019-08-26 ENCOUNTER — Other Ambulatory Visit: Payer: Self-pay

## 2019-08-26 ENCOUNTER — Inpatient Hospital Stay: Payer: Medicare Other

## 2019-08-26 ENCOUNTER — Inpatient Hospital Stay (HOSPITAL_BASED_OUTPATIENT_CLINIC_OR_DEPARTMENT_OTHER): Payer: Medicare Other | Admitting: Hematology & Oncology

## 2019-08-26 ENCOUNTER — Telehealth: Payer: Self-pay | Admitting: *Deleted

## 2019-08-26 ENCOUNTER — Inpatient Hospital Stay: Payer: Medicare Other | Admitting: Hematology & Oncology

## 2019-08-26 VITALS — BP 159/73 | HR 78 | Temp 98.3°F | Resp 17 | Ht 70.0 in

## 2019-08-26 DIAGNOSIS — E1165 Type 2 diabetes mellitus with hyperglycemia: Secondary | ICD-10-CM

## 2019-08-26 DIAGNOSIS — C9110 Chronic lymphocytic leukemia of B-cell type not having achieved remission: Secondary | ICD-10-CM

## 2019-08-26 DIAGNOSIS — Z5112 Encounter for antineoplastic immunotherapy: Secondary | ICD-10-CM | POA: Diagnosis not present

## 2019-08-26 DIAGNOSIS — R739 Hyperglycemia, unspecified: Secondary | ICD-10-CM

## 2019-08-26 LAB — CBC WITH DIFFERENTIAL (CANCER CENTER ONLY)
Abs Immature Granulocytes: 0.03 10*3/uL (ref 0.00–0.07)
Basophils Absolute: 0 10*3/uL (ref 0.0–0.1)
Basophils Relative: 0 %
Eosinophils Absolute: 0 10*3/uL (ref 0.0–0.5)
Eosinophils Relative: 0 %
HCT: 26.2 % — ABNORMAL LOW (ref 39.0–52.0)
Hemoglobin: 8.6 g/dL — ABNORMAL LOW (ref 13.0–17.0)
Immature Granulocytes: 1 %
Lymphocytes Relative: 30 %
Lymphs Abs: 0.7 10*3/uL (ref 0.7–4.0)
MCH: 29.8 pg (ref 26.0–34.0)
MCHC: 32.8 g/dL (ref 30.0–36.0)
MCV: 90.7 fL (ref 80.0–100.0)
Monocytes Absolute: 0 10*3/uL — ABNORMAL LOW (ref 0.1–1.0)
Monocytes Relative: 1 %
Neutro Abs: 1.6 10*3/uL — ABNORMAL LOW (ref 1.7–7.7)
Neutrophils Relative %: 68 %
Platelet Count: 79 10*3/uL — ABNORMAL LOW (ref 150–400)
RBC: 2.89 MIL/uL — ABNORMAL LOW (ref 4.22–5.81)
RDW: 18.9 % — ABNORMAL HIGH (ref 11.5–15.5)
WBC Count: 2.4 10*3/uL — ABNORMAL LOW (ref 4.0–10.5)
nRBC: 1.3 % — ABNORMAL HIGH (ref 0.0–0.2)

## 2019-08-26 LAB — CMP (CANCER CENTER ONLY)
ALT: 27 U/L (ref 0–44)
AST: 8 U/L — ABNORMAL LOW (ref 15–41)
Albumin: 3.6 g/dL (ref 3.5–5.0)
Alkaline Phosphatase: 106 U/L (ref 38–126)
Anion gap: 10 (ref 5–15)
BUN: 40 mg/dL — ABNORMAL HIGH (ref 6–20)
CO2: 24 mmol/L (ref 22–32)
Calcium: 9 mg/dL (ref 8.9–10.3)
Chloride: 101 mmol/L (ref 98–111)
Creatinine: 0.96 mg/dL (ref 0.61–1.24)
GFR, Est AFR Am: >60 mL/min (ref >60)
GFR, Estimated: >60 mL/min (ref >60)
Glucose, Bld: 407 mg/dL — ABNORMAL HIGH (ref 70–99)
Potassium: 4.1 mmol/L (ref 3.5–5.1)
Sodium: 135 mmol/L (ref 135–145)
Total Bilirubin: 0.3 mg/dL (ref 0.3–1.2)
Total Protein: 5.7 g/dL — ABNORMAL LOW (ref 6.5–8.1)

## 2019-08-26 LAB — URIC ACID: Uric Acid, Serum: 3.5 mg/dL — ABNORMAL LOW (ref 3.7–8.6)

## 2019-08-26 LAB — LACTATE DEHYDROGENASE: LDH: 141 U/L (ref 98–192)

## 2019-08-26 MED ORDER — DIPHENHYDRAMINE HCL 50 MG/ML IJ SOLN
INTRAMUSCULAR | Status: AC
  Filled 2019-08-26: qty 1

## 2019-08-26 MED ORDER — SODIUM CHLORIDE 0.9 % IV SOLN
Freq: Once | INTRAVENOUS | Status: AC
  Filled 2019-08-26: qty 250

## 2019-08-26 MED ORDER — SODIUM CHLORIDE 0.9 % IV SOLN
20.0000 mg | Freq: Once | INTRAVENOUS | Status: DC
  Filled 2019-08-26: qty 2

## 2019-08-26 MED ORDER — DEXAMETHASONE SODIUM PHOSPHATE 10 MG/ML IJ SOLN
10.0000 mg | Freq: Once | INTRAMUSCULAR | Status: AC
  Administered 2019-08-26: 10 mg via INTRAVENOUS

## 2019-08-26 MED ORDER — SODIUM CHLORIDE 0.9 % IV SOLN
1000.0000 mg | Freq: Once | INTRAVENOUS | Status: AC
  Administered 2019-08-26: 1000 mg via INTRAVENOUS
  Filled 2019-08-26: qty 40

## 2019-08-26 MED ORDER — DIPHENHYDRAMINE HCL 50 MG/ML IJ SOLN
50.0000 mg | Freq: Once | INTRAMUSCULAR | Status: AC
  Administered 2019-08-26: 50 mg via INTRAVENOUS

## 2019-08-26 MED ORDER — INSULIN REGULAR HUMAN 100 UNIT/ML IJ SOLN
20.0000 [IU] | Freq: Once | INTRAMUSCULAR | Status: AC
  Administered 2019-08-26: 20 [IU] via SUBCUTANEOUS

## 2019-08-26 MED ORDER — ACETAMINOPHEN 325 MG PO TABS
ORAL_TABLET | ORAL | Status: AC
  Filled 2019-08-26: qty 2

## 2019-08-26 MED ORDER — ACETAMINOPHEN 325 MG PO TABS
650.0000 mg | ORAL_TABLET | Freq: Once | ORAL | Status: AC
  Administered 2019-08-26: 650 mg via ORAL

## 2019-08-26 MED ORDER — DEXAMETHASONE SODIUM PHOSPHATE 10 MG/ML IJ SOLN
INTRAMUSCULAR | Status: AC
  Filled 2019-08-26: qty 1

## 2019-08-26 NOTE — Progress Notes (Signed)
MD aware of elevated HR, ok to proceed with treatement

## 2019-08-26 NOTE — Telephone Encounter (Signed)
Richardson Landry from lab brought to my attention a critical value of glucose of 407, and BUN of 40. Labs given to MD.

## 2019-08-26 NOTE — Patient Instructions (Addendum)
Cancer Center °Discharge Instructions for Patients Receiving Chemotherapy ° °Today you received the following chemotherapy agents Gazyva ° °To help prevent nausea and vomiting after your treatment, we encourage you to take your nausea medication as directed °  °If you develop nausea and vomiting that is not controlled by your nausea medication, call the clinic.  ° °BELOW ARE SYMPTOMS THAT SHOULD BE REPORTED IMMEDIATELY: °· *FEVER GREATER THAN 100.5 F °· *CHILLS WITH OR WITHOUT FEVER °· NAUSEA AND VOMITING THAT IS NOT CONTROLLED WITH YOUR NAUSEA MEDICATION °· *UNUSUAL SHORTNESS OF BREATH °· *UNUSUAL BRUISING OR BLEEDING °· TENDERNESS IN MOUTH AND THROAT WITH OR WITHOUT PRESENCE OF ULCERS °· *URINARY PROBLEMS °· *BOWEL PROBLEMS °· UNUSUAL RASH °Items with * indicate a potential emergency and should be followed up as soon as possible. ° °Feel free to call the clinic should you have any questions or concerns. The clinic phone number is [phone]. ° °Please show the CHEMO ALERT CARD at check-in to the Emergency Department and triage nurse. ° °Obinutuzumab injection °What is this medicine? °OBINUTUZUMAB (OH bi nue TOOZ ue mab) is a monoclonal antibody. It is used to treat chronic lymphocytic leukemia (CLL) and a type of non-Hodgkin lymphoma (NHL), follicular lymphoma. °This medicine may be used for other purposes; ask your health care provider or pharmacist if you have questions. °COMMON BRAND NAME(S): GAZYVA °What should I tell my health care provider before I take this medicine? °They need to know if you have any of these conditions: °· infection (especially a virus infection such as hepatitis B virus) °· lung or breathing disease °· heart disease °· take medicines that treat or prevent blood clots °· an unusual or allergic reaction to obinutuzumab, other medicines, foods, dyes, or preservatives °· pregnant or trying to get pregnant °· breast-feeding °How should I use this medicine? °This medicine is  for infusion into a vein. It is given by a health care professional in a hospital or clinic setting. °Talk to your pediatrician regarding the use of this medicine in children. Special care may be needed. °Overdosage: If you think you have taken too much of this medicine contact a poison control center or emergency room at once. °NOTE: This medicine is only for you. Do not share this medicine with others. °What if I miss a dose? °Keep appointments for follow-up doses as directed. It is important not to miss your dose. Call your doctor or health care professional if you are unable to keep an appointment. °What may interact with this medicine? °· live virus vaccines °This list may not describe all possible interactions. Give your health care provider a list of all the medicines, herbs, non-prescription drugs, or dietary supplements you use. Also tell them if you smoke, drink alcohol, or use illegal drugs. Some items may interact with your medicine. °What should I watch for while using this medicine? °Report any side effects that you notice during your treatment right away, such as changes in your breathing, fever, chills, dizziness or lightheadedness. These effects are more common with the first dose. °Visit your prescriber or health care professional for checks on your progress. You will need to have regular blood work. Report any other side effects. The side effects of this medicine can continue after you finish your treatment. Continue your course of treatment even though you feel ill unless your doctor tells you to stop. °Call your doctor or health care professional for advice if you get a fever, chills or sore throat, or other   symptoms of a cold or flu. Do not treat yourself. This drug decreases your body's ability to fight infections. Try to avoid being around people who are sick. °This medicine may increase your risk to bruise or bleed. Call your doctor or health care professional if you notice any unusual  bleeding. °Do not become pregnant while taking this medicine or for 6 months after stopping it. Women should inform their doctor if they wish to become pregnant or think they might be pregnant. There is a potential for serious side effects to an unborn child. Talk to your health care professional or pharmacist for more information. Do not breast-feed an infant while taking this medicine or for 6 months after stopping it. °What side effects may I notice from receiving this medicine? °Side effects that you should report to your doctor or health care professional as soon as possible: °· allergic reactions like skin rash, itching or hives, swelling of the face, lips, or tongue °· breathing problems °· changes in vision °· chest pain or chest tightness °· confusion °· dizziness °· loss of balance or coordination °· low blood counts - this medicine may decrease the number of white blood cells, red blood cells and platelets. You may be at increased risk for infections and bleeding. °· signs of decreased platelets or bleeding - bruising, pinpoint red spots on the skin, black, tarry stools, blood in the urine °· signs of infection - fever or chills, cough, sore throat, pain or trouble passing urine °· signs and symptoms of liver injury like dark yellow or brown urine; general ill feeling or flu-like symptoms; light-colored stools; loss of appetite; nausea; right upper belly pain; unusually weak or tired; yellowing of the eyes or skin °· trouble speaking or understanding °· trouble walking °· vomiting °Side effects that usually do not require medical attention (report to your doctor or health care professional if they continue or are bothersome): °· constipation °· joint pain °· muscle pain °This list may not describe all possible side effects. Call your doctor for medical advice about side effects. You may report side effects to FDA at 1-800-FDA-1088. °Where should I keep my medicine? °This drug is only given in a hospital  or clinic and will not be stored at home. °NOTE: This sheet is a summary. It may not cover all possible information. If you have questions about this medicine, talk to your doctor, pharmacist, or health care provider. °© 2020 [employer] (2018-11-24 15:34:53) ° °

## 2019-08-26 NOTE — Progress Notes (Signed)
This encounter was created in error - please disregard.

## 2019-08-26 NOTE — Progress Notes (Signed)
Hematology and Oncology Follow Up Visit  Larry Lam NO:[id number] May 01, 1971 48 y.o. 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  He has recently treated CLL.  He apparently has had CLL for quite a while.  However, he was hospitalized recently.  He had pancytopenia.  He underwent extensive work-up.  He is followed by Larry Lam.  Larry Lam has elected to treat him with Gazyva/acalabrutinib.  Larry Lam clearly has high risk CLL.  He is young.  He has high risk markers.  So far, he has only had 1 day of the acalabrutinib.  He has had no issues otherwise.  He is diabetic.  Blood sugar today is 407.  We need to give him some insulin today.  He has had no fever.  He has had no cough.  He stopped smoking a month ago.  There is no problems with bowels or bladder.  He has had no diarrhea.  He is not noted any palpable lymph glands.  He has had no leg swelling.  Overall, I would say his performance status is ECOG 1.  Medications:  Current Outpatient Medications:  .  acalabrutinib (CALQUENCE) 100 MG capsule, Take 1 capsule (100 mg total) by mouth 2 (two) times daily., Disp: 60 capsule, Rfl: 2 .  albuterol (PROVENTIL HFA;VENTOLIN HFA) 108 (90 Base) MCG/ACT inhaler, Inhale 2 puffs into the lungs every 6 (six) hours as needed for wheezing or shortness of breath., Disp: 1 Inhaler, Rfl: 0 .  allopurinol (ZYLOPRIM) 100 MG tablet, Take 1 tablet (100 mg total) by mouth 2 (two) times daily., Disp: 60 tablet, Rfl: 0 .  ASPIRIN 81 PO, Take 81 mg by mouth., Disp: , Rfl:  .  atorvastatin (LIPITOR) 40 MG tablet, Take 1 tablet by mouth daily., Disp: , Rfl:  .  escitalopram (LEXAPRO) 20 MG tablet, Take 20 mg by mouth daily., Disp: , Rfl:  .  Fluticasone-Salmeterol (ADVAIR) 250-50 MCG/DOSE AEPB, Inhale into the lungs., Disp:  , Rfl:  .  Insulin Detemir (LEVEMIR) 100 UNIT/ML Pen, Inject 30 Units into the skin daily., Disp: 18 mL, Rfl: 0 .  Insulin Pen Needle (PEN NEEDLES 3/16") 31G X 5 MM MISC, Use as directed with insulin pen, Disp: 100 each, Rfl: 0 .  ipratropium-albuterol (DUONEB) 0.5-2.5 (3) MG/3ML SOLN, Take 3 mLs by nebulization every 6 (six) hours as needed. J44.9 and J44.1, Disp: 360 mL, Rfl: 0 .  pantoprazole (PROTONIX) 40 MG tablet, Take 1 tablet (40 mg total) by mouth daily., Disp: 30 tablet, Rfl: 11 .  predniSONE (DELTASONE) 50 MG tablet, Please take 4 tablets (80 mg) by mouth daily for 7 days starting on 08/20/2019, then take 3 tablets (60 mg) by mouth daily for 7 days, followed by 2 tablets (40 mg) by mouth daily for 7 days, followed by 1 tablet (20 mg) daily for 7 days, Disp: 70 tablet, Rfl: 0 .  umeclidinium bromide (INCRUSE ELLIPTA) 62.5 MCG/INH AEPB, Inhale into the lungs., Disp: , Rfl:  .  vitamin B-12 (CYANOCOBALAMIN) 1000 MCG tablet, Take 1 tablet (1,000 mcg total) by mouth daily., Disp: 90 tablet, Rfl: 0 No current facility-administered medications for this visit.  Facility-Administered Medications Ordered in Other Visits:  .  dexamethasone (DECADRON) 20 mg in sodium chloride 0.9 % 50 mL IVPB, 20 mg, Intravenous, Once, Curt Bears, MD .  obinutuzumab (GAZYVA) 1,000 mg in sodium chloride 0.9 % 250 mL (3.4483 mg/mL) chemo infusion, 1,000 mg, Intravenous, Once, Curt Bears, MD  Allergies:  Allergies  Allergen Reactions  . Amlodipine Besylate-Valsartan Other (See Comments)    Lowered potassium  . Hydrochlorothiazide Other (See Comments)    Lowered potassium    Past Medical History, Surgical history, Social history, and Family History were reviewed and updated.  Review of Systems: Review of Systems  Constitutional: Negative.   HENT:  Negative.   Eyes: Negative.   Respiratory: Negative.   Cardiovascular: Negative.   Gastrointestinal: Negative.   Endocrine: Negative.    Genitourinary: Negative.    Musculoskeletal: Negative.   Skin: Negative.   Neurological: Negative.   Hematological: Negative.   Psychiatric/Behavioral: Negative.     Physical Exam:  vitals were not taken for this visit.   Wt Readings from Last 3 Encounters:  08/26/19 211 lb (95.7 kg)  08/18/19 205 lb 11.2 oz (93.3 kg)  08/13/19 212 lb 3.2 oz (96.3 kg)    Physical Exam Vitals reviewed.  HENT:     Head: Normocephalic and atraumatic.  Eyes:     Pupils: Pupils are equal, round, and reactive to light.  Cardiovascular:     Rate and Rhythm: Normal rate and regular rhythm.     Heart sounds: Normal heart sounds.  Pulmonary:     Effort: Pulmonary effort is normal.     Breath sounds: Normal breath sounds.  Abdominal:     General: Bowel sounds are normal.     Palpations: Abdomen is soft.  Musculoskeletal:        General: No tenderness or deformity. Normal range of motion.     Cervical back: Normal range of motion.  Lymphadenopathy:     Cervical: No cervical adenopathy.  Skin:    General: Skin is warm and dry.     Findings: No erythema or rash.  Neurological:     Mental Status: He is alert and oriented to person, place, and time.  Psychiatric:        Behavior: Behavior normal.        Thought Content: Thought content normal.        Judgment: Judgment normal.      Lab Results  Component Value Date   WBC 2.4 (L) 08/26/2019   HGB 8.6 (L) 08/26/2019   HCT 26.2 (L) 08/26/2019   MCV 90.7 08/26/2019   PLT 79 (L) 08/26/2019     Chemistry      Component Value Date/Time   NA 135 08/26/2019 0735   NA 137 07/06/2012 0506   K 4.1 08/26/2019 0735   K 4.0 07/06/2012 0506   CL 101 08/26/2019 0735   CL 104 07/06/2012 0506   CO2 24 08/26/2019 0735   CO2 25 07/06/2012 0506   BUN 40 (H) 08/26/2019 0735   BUN 12 07/06/2012 0506   CREATININE 0.96 08/26/2019 0735   CREATININE 0.62 07/06/2012 0506      Component Value Date/Time   CALCIUM 9.0 08/26/2019 0735   CALCIUM 8.6  07/06/2012 0506   ALKPHOS 106 08/26/2019 0735   ALKPHOS 96 07/06/2012 0506   AST 8 (L) 08/26/2019 0735   ALT 27 08/26/2019 0735   ALT 30 07/06/2012 0506   BILITOT 0.3 08/26/2019 0735       Impression and Plan: Larry Lam is a 48 year old white male with CLL.  I would clearly put him in the high risk category.  He is on acalabrutinib [place].  I  think if he does not have a good response, then I probably would favor changing the Gazyva out and adding venetoclax.  Ultimately, I suspect that he probably is going to need some form of stem cell transplant or possibly CAR-T therapy.  I know that he is getting fantastic care with Larry Lam. We are seeing him today just because there is overflow at the main [facility] with the [place] holiday tomorrow.  We will be more than happy to see him at any time.  [place] in [place]  He does not want to go to the cancer center at [address], it certainly would be easy for him to make it down to our office if for some reason, he cannot make it to the [facility].   Volanda Napoleon, MD 12/31/20209:15 AM

## 2019-08-30 ENCOUNTER — Other Ambulatory Visit: Payer: Self-pay | Admitting: *Deleted

## 2019-08-30 ENCOUNTER — Telehealth: Payer: Self-pay | Admitting: *Deleted

## 2019-08-30 MED ORDER — MAGIC MOUTHWASH: 5.0000 mL | Freq: Four times a day (QID) | ORAL | 0 refills | Status: DC | PRN

## 2019-08-30 NOTE — Telephone Encounter (Signed)
Please send a prescription for Magic mouthwash to his pharmacy.  He also can use salt water.  Thank you.

## 2019-08-30 NOTE — Telephone Encounter (Signed)
MMW called into pharmacy. Wife notified

## 2019-08-30 NOTE — Telephone Encounter (Signed)
Wife states Joban's mouth is broken out/ has blisters X 2-3 days. He is unable to eat.

## 2019-09-01 ENCOUNTER — Other Ambulatory Visit: Payer: Self-pay

## 2019-09-01 ENCOUNTER — Encounter: Payer: Self-pay | Admitting: Internal Medicine

## 2019-09-01 ENCOUNTER — Inpatient Hospital Stay: Payer: Medicare Other | Attending: Physician Assistant | Admitting: Internal Medicine

## 2019-09-01 ENCOUNTER — Inpatient Hospital Stay: Payer: Medicare Other

## 2019-09-01 VITALS — BP 160/67 | HR 106 | Temp 99.1°F | Resp 20 | Ht 70.0 in | Wt 212.6 lb

## 2019-09-01 DIAGNOSIS — Z79899 Other long term (current) drug therapy: Secondary | ICD-10-CM | POA: Diagnosis not present

## 2019-09-01 DIAGNOSIS — D696 Thrombocytopenia, unspecified: Secondary | ICD-10-CM | POA: Diagnosis not present

## 2019-09-01 DIAGNOSIS — Z5112 Encounter for antineoplastic immunotherapy: Secondary | ICD-10-CM | POA: Diagnosis not present

## 2019-09-01 DIAGNOSIS — Z7951 Long term (current) use of inhaled steroids: Secondary | ICD-10-CM | POA: Diagnosis not present

## 2019-09-01 DIAGNOSIS — C9110 Chronic lymphocytic leukemia of B-cell type not having achieved remission: Secondary | ICD-10-CM

## 2019-09-01 DIAGNOSIS — R161 Splenomegaly, not elsewhere classified: Secondary | ICD-10-CM | POA: Diagnosis not present

## 2019-09-01 DIAGNOSIS — Z794 Long term (current) use of insulin: Secondary | ICD-10-CM | POA: Diagnosis not present

## 2019-09-01 DIAGNOSIS — J449 Chronic obstructive pulmonary disease, unspecified: Secondary | ICD-10-CM | POA: Diagnosis not present

## 2019-09-01 DIAGNOSIS — E119 Type 2 diabetes mellitus without complications: Secondary | ICD-10-CM | POA: Insufficient documentation

## 2019-09-01 DIAGNOSIS — Z7952 Long term (current) use of systemic steroids: Secondary | ICD-10-CM | POA: Insufficient documentation

## 2019-09-01 DIAGNOSIS — Z7982 Long term (current) use of aspirin: Secondary | ICD-10-CM | POA: Diagnosis not present

## 2019-09-01 DIAGNOSIS — I10 Essential (primary) hypertension: Secondary | ICD-10-CM | POA: Diagnosis not present

## 2019-09-01 DIAGNOSIS — Z87891 Personal history of nicotine dependence: Secondary | ICD-10-CM | POA: Insufficient documentation

## 2019-09-01 LAB — CMP (CANCER CENTER ONLY)
ALT: 26 U/L (ref 0–44)
AST: 10 U/L — ABNORMAL LOW (ref 15–41)
Albumin: 3.5 g/dL (ref 3.5–5.0)
Alkaline Phosphatase: 91 U/L (ref 38–126)
Anion gap: 10 (ref 5–15)
BUN: 24 mg/dL — ABNORMAL HIGH (ref 6–20)
CO2: 28 mmol/L (ref 22–32)
Calcium: 9 mg/dL (ref 8.9–10.3)
Chloride: 100 mmol/L (ref 98–111)
Creatinine: 0.9 mg/dL (ref 0.61–1.24)
GFR, Est AFR Am: >60 mL/min (ref >60)
GFR, Estimated: >60 mL/min (ref >60)
Glucose, Bld: 163 mg/dL — ABNORMAL HIGH (ref 70–99)
Potassium: 4.4 mmol/L (ref 3.5–5.1)
Sodium: 138 mmol/L (ref 135–145)
Total Bilirubin: 0.5 mg/dL (ref 0.3–1.2)
Total Protein: 6 g/dL — ABNORMAL LOW (ref 6.5–8.1)

## 2019-09-01 LAB — LACTATE DEHYDROGENASE: LDH: 178 U/L (ref 98–192)

## 2019-09-01 LAB — URIC ACID: Uric Acid, Serum: 4.2 mg/dL (ref 3.7–8.6)

## 2019-09-01 LAB — CBC WITH DIFFERENTIAL (CANCER CENTER ONLY)
Abs Immature Granulocytes: 0.03 10*3/uL (ref 0.00–0.07)
Basophils Absolute: 0 10*3/uL (ref 0.0–0.1)
Basophils Relative: 0 %
Eosinophils Absolute: 0 10*3/uL (ref 0.0–0.5)
Eosinophils Relative: 0 %
HCT: 30.3 % — ABNORMAL LOW (ref 39.0–52.0)
Hemoglobin: 9.9 g/dL — ABNORMAL LOW (ref 13.0–17.0)
Immature Granulocytes: 1 %
Lymphocytes Relative: 49 %
Lymphs Abs: 2 10*3/uL (ref 0.7–4.0)
MCH: 31.2 pg (ref 26.0–34.0)
MCHC: 32.7 g/dL (ref 30.0–36.0)
MCV: 95.6 fL (ref 80.0–100.0)
Monocytes Absolute: 0.1 10*3/uL (ref 0.1–1.0)
Monocytes Relative: 2 %
Neutro Abs: 1.9 10*3/uL (ref 1.7–7.7)
Neutrophils Relative %: 48 %
Platelet Count: 107 10*3/uL — ABNORMAL LOW (ref 150–400)
RBC: 3.17 MIL/uL — ABNORMAL LOW (ref 4.22–5.81)
RDW: 20.9 % — ABNORMAL HIGH (ref 11.5–15.5)
WBC Count: 4.1 10*3/uL (ref 4.0–10.5)
nRBC: 0.7 % — ABNORMAL HIGH (ref 0.0–0.2)

## 2019-09-01 NOTE — Progress Notes (Signed)
[place] [phone]   [phone]  OFFICE PROGRESS NOTE  [name], MD [place] [address]  DIAGNOSIS: CLL/small lymphocytic lymphoma diagnosed in 2010 and has been in observation for several years with no treatment. 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 The cytogenetics were negative for P 17.   PRIOR THERAPY: None  CURRENT THERAPY: Obinutuzumab 1,000 mg IV. First dose on 08/10/2019. Status post day 1 cycle 1. He will also begin acalabrutinib 100 mg BID starting on 08/19/2019  INTERVAL HISTORY: Larry Lam 49 y.o. male returns to the clinic today for follow-up visit.  The patient is feeling fine today with no concerning complaints.  He is tolerating his current treatment with obinutuzumab as well as acalabrutinib fairly well.  The only complaint he has is some mouth sores and he was given prescription for Magic mouthwash with some improvement.  He denied having any current chest pain, shortness of breath, cough or hemoptysis.  He denied having any fever or chills.  He has no nausea, vomiting, diarrhea or constipation.  He has no headache or visual changes.  The patient had a PET scan performed recently and he is here for evaluation and discussion of his scan results.  MEDICAL HISTORY: Past Medical History:  Diagnosis Date   Asthma    COPD (chronic obstructive pulmonary disease) ([place])    Diabetes mellitus without complication ([place])    Hypertension    Seizures ([place])    3-4 years ago. Single episode.   Tobacco use     ALLERGIES:  is allergic to amlodipine besylate-valsartan and hydrochlorothiazide.  MEDICATIONS:  Current Outpatient Medications  Medication Sig Dispense Refill   acalabrutinib (CALQUENCE) 100 MG capsule Take 1 capsule (100 mg total) by mouth 2 (two) times daily. 60 capsule 2   albuterol (PROVENTIL HFA;VENTOLIN HFA) 108 (90 Base)  MCG/ACT inhaler Inhale 2 puffs into the lungs every 6 (six) hours as needed for wheezing or shortness of breath. 1 Inhaler 0   allopurinol (ZYLOPRIM) 100 MG tablet Take 1 tablet (100 mg total) by mouth 2 (two) times daily. 60 tablet 0   ASPIRIN 81 PO Take 81 mg by mouth.     atorvastatin (LIPITOR) 40 MG tablet Take 1 tablet by mouth daily.     escitalopram (LEXAPRO) 20 MG tablet Take 20 mg by mouth daily.     Fluticasone-Salmeterol (ADVAIR) 250-50 MCG/DOSE AEPB Inhale into the lungs.     Insulin Detemir (LEVEMIR) 100 UNIT/ML Pen Inject 30 Units into the skin daily. 18 mL 0   Insulin Pen Needle (PEN NEEDLES 3/16") 31G X 5 MM MISC Use as directed with insulin pen 100 each 0   ipratropium-albuterol (DUONEB) 0.5-2.5 (3) MG/3ML SOLN Take 3 mLs by nebulization every 6 (six) hours as needed. J44.9 and J44.1 360 mL 0   magic mouthwash SOLN Take 5 mLs by mouth 4 (four) times daily as needed for mouth pain. 240 mL 0   pantoprazole (PROTONIX) 40 MG tablet Take 1 tablet (40 mg total) by mouth daily. 30 tablet 11   predniSONE (DELTASONE) 50 MG tablet Please take 4 tablets (80 mg) by mouth daily for 7 days starting on 08/20/2019, then take 3 tablets (60 mg) by mouth daily for 7 days, followed by 2 tablets (40 mg) by mouth daily for 7 days, followed by 1 tablet (20 mg) daily for 7 days 70 tablet 0  umeclidinium bromide (INCRUSE ELLIPTA) 62.5 MCG/INH AEPB Inhale into the lungs.     vitamin B-12 (CYANOCOBALAMIN) 1000 MCG tablet Take 1 tablet (1,000 mcg total) by mouth daily. 90 tablet 0   No current facility-administered medications for this visit.    SURGICAL HISTORY: No past surgical history on file.  REVIEW OF SYSTEMS:  Constitutional: negative Eyes: negative Ears, nose, mouth, throat, and face: positive for sore mouth Respiratory: negative Cardiovascular: negative Gastrointestinal: negative Genitourinary:negative Integument/breast: negative Hematologic/lymphatic:  negative Musculoskeletal:negative Neurological: negative Behavioral/Psych: negative Endocrine: negative Allergic/Immunologic: negative   PHYSICAL EXAMINATION: General appearance: alert, cooperative, fatigued and no distress Head: Normocephalic, without obvious abnormality, atraumatic Neck: no adenopathy, no JVD, supple, symmetrical, trachea midline and thyroid not enlarged, symmetric, no tenderness/mass/nodules Lymph nodes: Cervical, supraclavicular, and axillary nodes normal. Resp: clear to auscultation bilaterally Back: symmetric, no curvature. ROM normal. No CVA tenderness. Cardio: regular rate and rhythm, S1, S2 normal, no murmur, click, rub or gallop GI: soft, non-tender; bowel sounds normal; no masses,  no organomegaly Extremities: extremities normal, atraumatic, no cyanosis or edema Neurologic: Alert and oriented X 3, normal strength and tone. Normal symmetric reflexes. Normal coordination and gait  ECOG PERFORMANCE STATUS: 1 - Symptomatic but completely ambulatory  Blood pressure (!) 160/67, pulse (!) 106, temperature 99.1 F (37.3 C), temperature source Oral, resp. rate 20, height '5\' 10"'$  (1.778 m), weight 212 lb 9.6 oz (96.4 kg), SpO2 97 %.  LABORATORY DATA: Lab Results  Component Value Date   WBC 4.1 09/01/2019   HGB 9.9 (L) 09/01/2019   HCT 30.3 (L) 09/01/2019   MCV 95.6 09/01/2019   PLT 107 (L) 09/01/2019      Chemistry      Component Value Date/Time   NA 135 08/26/2019 0735   NA 137 07/06/2012 0506   K 4.1 08/26/2019 0735   K 4.0 07/06/2012 0506   CL 101 08/26/2019 0735   CL 104 07/06/2012 0506   CO2 24 08/26/2019 0735   CO2 25 07/06/2012 0506   BUN 40 (H) 08/26/2019 0735   BUN 12 07/06/2012 0506   CREATININE 0.96 08/26/2019 0735   CREATININE 0.62 07/06/2012 0506      Component Value Date/Time   CALCIUM 9.0 08/26/2019 0735   CALCIUM 8.6 07/06/2012 0506   ALKPHOS 106 08/26/2019 0735   ALKPHOS 96 07/06/2012 0506   AST 8 (L) 08/26/2019 0735   ALT 27  08/26/2019 0735   ALT 30 07/06/2012 0506   BILITOT 0.3 08/26/2019 0735       RADIOGRAPHIC STUDIES: DG Chest 1 View  Result Date: 08/09/2019 CLINICAL DATA:  ETT placement EXAM: CHEST  1 VIEW COMPARISON:  Radiographs 08/09/2019 FINDINGS: Endotracheal tube in the mid trachea, 4.5 cm from the carina. Transesophageal tube tip beyond the GE junction curling in the left upper quadrant, terminating below the level of imaging. Diffuse interstitial pulmonary opacity throughout both lungs with few more patchy areas of opacity. Some right basilar pleural thickening compatible with fusion. Suspect at least trace left effusion as well. Cardiomediastinal contours are stable. No acute osseous or soft tissue abnormality. IMPRESSION: 1. Endotracheal tube 4.5 cm from the carina. 2. Transesophageal tube tip curls in the left upper quadrant, terminating below the level of imaging. 3. Diffuse interstitial airspace opacities compatible with infection and/or edema. Overall similar in extent to comparison studies. Electronically Signed   By: Lovena Le M.D.   On: 08/09/2019 14:40   DG Chest 2 View  Result Date: 08/02/2019 CLINICAL DATA:  Shortness of breath, recent  discharge, CLL EXAM: CHEST - 2 VIEW COMPARISON:  Chest CT 04/30/2019, chest radiograph 08/01/2019 FINDINGS: Diffusely increased interstitial markings are again seen. No pneumothorax. More nodular masslike opacity seen in the left costophrenic sulcus possibly reflecting some prominent lymph nodes in the pericardial fat pad on comparison chest CT. Additional ovoid opacities are seen in the soft tissues of the axilla and base of the neck. Bilateral effusions are noted. Cardiomediastinal contours are similar to prior with nodular densities in the hila likely reflecting adenopathy. No acute osseous abnormality. Degenerative changes in the shoulders. IMPRESSION: Nonspecific increased interstitial markings with bilateral effusions. Could reflect atypical infection or  edema. Nodular opacity in the left costophrenic sulcus may reflect adenopathy within the pericardial fat. Additional nodular opacities in the hila and axilla likely reflecting further adenopathy in the setting of CLL. Electronically Signed   By: Lovena Le M.D.   On: 08/02/2019 22:13   CT Angio Chest PE W/Cm &/Or Wo Cm  Result Date: 08/05/2019 CLINICAL DATA:  Shortness of breath, CLL EXAM: CT ANGIOGRAPHY CHEST WITH CONTRAST TECHNIQUE: Multidetector CT imaging of the chest was performed using the standard protocol during bolus administration of intravenous contrast. Multiplanar CT image reconstructions and MIPs were obtained to evaluate the vascular anatomy. CONTRAST:  126m OMNIPAQUE IOHEXOL 350 MG/ML SOLN COMPARISON:  CT chest abdomen pelvis, 04/30/2011 FINDINGS: Cardiovascular: Satisfactory opacification of the pulmonary arteries to the segmental level. No evidence of pulmonary embolism. Normal heart size. No pericardial effusion. Mediastinum/Nodes: Numerous bulky bilateral axillary, lower cervical, mediastinal, and hilar lymph nodes, similar in size compared to CT dated 04/30/2019. Thyroid gland, trachea, and esophagus demonstrate no significant findings. Lungs/Pleura: Small bilateral pleural effusions. Diffuse bilateral interlobular septal thickening and scattered ground-glass opacities throughout the lungs. There are occasional small pulmonary nodules, which are new or enlarged when compared to prior CT. A nodule of the right pulmonary apex measures 7 mm, previously 3 mm when measured similarly (series 7, image 24). A new nodule of the right lower lobe measures 6 mm (series 7, image 84). Upper Abdomen: No acute abnormality. There is redemonstrated bulky celiac axis and gastrohepatic ligament lymphadenopathy, again similar to prior examination dated 04/30/2019 to the extent imaged. Musculoskeletal: No chest wall abnormality. No acute or significant osseous findings. Review of the MIP images confirms the  above findings. IMPRESSION: 1.  Negative examination for pulmonary embolism. 2. Small bilateral pleural effusions with diffuse bilateral interlobular septal thickening and scattered ground-glass opacity throughout the lungs. These findings are consistent with infection or edema. 3. There are occasional small pulmonary nodules, which are new or enlarged when compared to prior CT. A nodule of the right pulmonary apex measures 7 mm, previously 3 mm when measured similarly (series 7, image 24). A new nodule of the right lower lobe measures 6 mm (series 7, image 84). These are most likely infectious or inflammatory; pulmonary nodules are an unlikely manifestation of pulmonary involvement of lymphoma. 4. Numerous bulky bilateral axillary, lower cervical, mediastinal, and hilar lymph nodes, as well as bulky lymph nodes in the partially imaged upper abdomen, similar in size compared to CT dated 04/30/2019 and in keeping with diagnosis of CLL. Electronically Signed   By: AEddie CandleM.D.   On: 08/05/2019 15:26   CT ABDOMEN PELVIS W CONTRAST  Result Date: 08/06/2019 CLINICAL DATA:  CLL/SLL. EXAM: CT ABDOMEN AND PELVIS WITH CONTRAST TECHNIQUE: Multidetector CT imaging of the abdomen and pelvis was performed using the standard protocol following bolus administration of intravenous contrast. CONTRAST:  1035mOMNIPAQUE  IOHEXOL 300 MG/ML  SOLN COMPARISON:  04/30/2019 FINDINGS: Lower chest: There are moderate to large bilateral pleural effusions that are only partially visualized on this exam.The heart size is borderline enlarged. There are few ground-glass airspace opacities at the lung bases with interlobular septal thickening suggestive of volume overload/developing pulmonary edema. Hepatobiliary: The liver is normal. There is layering hyperdense material within the gallbladder lumen. This may represent gallbladder sludge or vicarious excretion of contrast from the patient's prior contrast enhanced CT.There is no biliary  ductal dilation. Pancreas: Normal contours without ductal dilatation. No peripancreatic fluid collection. Spleen: The spleen is significantly enlarged Adrenals/Urinary Tract: --Adrenal glands: No adrenal hemorrhage. --Right kidney/ureter: No hydronephrosis or perinephric hematoma. --Left kidney/ureter: No hydronephrosis or perinephric hematoma. --Urinary bladder: Unremarkable. Stomach/Bowel: --Stomach/Duodenum: No hiatal hernia or other gastric abnormality. Normal duodenal course and caliber. --Small bowel: No dilatation or inflammation. --Colon: No focal abnormality. --Appendix: Normal. Vascular/Lymphatic: Atherosclerotic calcification is present within the non-aneurysmal abdominal aorta, without hemodynamically significant stenosis. --there is extensive retroperitoneal adenopathy --there is extensive mesenteric adenopathy --there is extensive pelvic and inguinal adenopathy. Overall, the adenopathy appears to be relatively stable to slightly improved when compared to prior study. Reproductive: Unremarkable Other: No ascites or free air. The abdominal wall is normal. Musculoskeletal. 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 There is no acute displaced fracture. IMPRESSION: 1. Moderate to large bilateral pleural effusions with adjacent atelectasis. 2. Extensive adenopathy throughout the abdomen and pelvis, similar to slightly improved when compared to September 2020 CT. 3. Splenomegaly. 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 Aortic Atherosclerosis (ICD10-I70.0). Electronically Signed   By: Constance Holster M.D.   On: 08/06/2019 22:34   NM PET Image Restag (PS) Skull Base To Thigh  Result Date: 08/25/2019 CLINICAL DATA:  Initial treatment strategy for CLL with rapid progression of bulky adenopathy worrisome for Richter transformation. Undergoing treatment. EXAM: NUCLEAR MEDICINE PET SKULL BASE TO THIGH TECHNIQUE: 9.8 mCi F-18 FDG was injected  intravenously. Full-ring PET imaging was performed from the skull base to thigh after the radiotracer. CT data was obtained and used for attenuation correction and anatomic localization. Fasting blood glucose: 197 mg/dl COMPARISON:  CT scan 08/06/2019 FINDINGS: Mediastinal blood pool activity: SUV max 2.42 Liver activity: SUV max 3.68 NECK: Small multi station neck adenopathy but no hypermetabolism. Incidental CT findings: none CHEST: Supraclavicular, axillary and subpectoral adenopathy, slightly decreased in size since the recent CT examinations. Left axillary node on image 61/4 measures 18.5 mm and previously measured 27 mm. 25 mm right axillary node on image 71/4 previously measured 35 mm. Right paratracheal lymph node on image 69/4 measures 15 mm and previously measured 22.5 mm. 10 mm prevascular lymph node on image 70/for previously measured 18 mm. No hypermetabolism is demonstrated. No worrisome pulmonary lesions or pleural effusion. The pleural effusion seen on the prior CT have resolved. Incidental CT findings: none ABDOMEN/PELVIS: Gastrohepatic ligament, celiac axis, mesenteric and retroperitoneal adenopathy all demonstrating interval decrease in size. No hypermetabolism. Bulky pelvic lymphadenopathy and inguinal lymphadenopathy also decreased in size since the prior study. No hypermetabolism. 21 mm right obturator lymph node on image 180/for previously measured 33 mm. 21.5 mm right axillary node on image 196/4 previously measured 31 mm. Incidental CT findings: none SKELETON: No significant osseous findings. Uptake noted at the costochondral junction of the ribs, likely inflammatory. Incidental CT findings: none IMPRESSION: 1. Interval decrease in size of the diffuse adenopathy in the neck, chest, abdomen and pelvis when compared to  the recent prior CT scans from 08/05/2019 and 08/06/2019. This would suggest a good, early response to treatment. 2. No hypermetabolism is demonstrated ([place] 1). 3.  Resolution of pleural effusions. Electronically Signed   By: Marijo Sanes M.D.   On: 08/25/2019 14:23   DG Chest Port 1 View  Result Date: 08/12/2019 CLINICAL DATA:  Acute respiratory failure. EXAM: PORTABLE CHEST 1 VIEW COMPARISON:  08/11/2019 FINDINGS: Endotracheal tube and nasogastric tube have been removed. Persistent interstitial densities but there is improved aeration in the right lower lung. Heart size is within normal limits. Persistent hazy densities at the lung bases and the medial aspect of the left hemidiaphragm remains obscured. Negative for a pneumothorax. Bone structures are unremarkable. IMPRESSION: 1. Slightly improved aeration in the lungs. Findings may represent decreasing pulmonary edema. 2. Persistent hazy densities at the lung base are suggestive for atelectasis and probable pleural effusions. Suspect focal volume loss or consolidation at the medial left lung base. Electronically Signed   By: Markus Daft M.D.   On: 08/12/2019 08:15   DG Chest Port 1 View  Result Date: 08/11/2019 CLINICAL DATA:  ETT, respiratory failure EXAM: PORTABLE CHEST 1 VIEW COMPARISON:  08/10/2019 FINDINGS: Interval increase in heterogeneous opacity of the right lung base and a probable layering pleural effusion. Otherwise unchanged mild, diffuse interstitial opacity. Endotracheal tube and esophagogastric tube are unchanged. IMPRESSION: 1. Interval increase in heterogeneous opacity of the right lung base and a probable layering pleural effusion. 2. Otherwise unchanged mild, diffuse interstitial opacity, in keeping with infection or edema. 3.  Endotracheal tube and esophagogastric tube are unchanged. Electronically Signed   By: Eddie Candle M.D.   On: 08/11/2019 09:00   DG Chest Port 1 View  Result Date: 08/10/2019 CLINICAL DATA:  Respiratory failure. EXAM: PORTABLE CHEST 1 VIEW COMPARISON:  August 09, 2019. FINDINGS: Stable cardiomediastinal silhouette. Endotracheal and nasogastric tubes are unchanged in  position. No pneumothorax or pleural effusion is noted. Stable bibasilar opacities are noted concerning for edema or possibly inflammation. Bony thorax is unremarkable. IMPRESSION: Stable support apparatus. Stable bibasilar opacities are noted concerning for edema or possibly inflammation. Electronically Signed   By: Marijo Conception M.D.   On: 08/10/2019 10:29   DG Chest Port 1 View  Result Date: 08/09/2019 CLINICAL DATA:  Endotracheal tube EXAM: PORTABLE CHEST 1 VIEW COMPARISON:  08/08/2019 FINDINGS: No significant interval change in AP portable examination with endotracheal tube and esophagogastric tube in position. There remains mild, diffuse interstitial pulmonary opacity throughout, consistent with mild edema or infection. No new or focal airspace opacity. The heart and mediastinum are unremarkable. IMPRESSION: No significant interval change in diffuse interstitial opacity throughout both lungs, consistent with mild edema or infection. Electronically Signed   By: Eddie Candle M.D.   On: 08/09/2019 08:07   DG CHEST PORT 1 VIEW  Result Date: 08/08/2019 CLINICAL DATA:  49 year old male status post cardiac arrest yesterday. History of CLL and suspected recent pneumonia. Negative for COVID-19. EXAM: PORTABLE CHEST 1 VIEW COMPARISON:  08/07/2019 and earlier. FINDINGS: Portable AP semi upright view at 0531 hours. Endotracheal tube tip in good position between the level the clavicles and carina. Enteric tube courses to the abdomen, tip not included. Course and asymmetric bilateral pulmonary interstitial opacity with regressed more confluent perihilar opacity since yesterday. The ventilation now resembles that on 08/05/2019. There is no longer confluent opacity at the left lateral lung base. No pneumothorax or pleural effusion. Paucity of bowel gas. No acute osseous abnormality identified. IMPRESSION: 1.  Satisfactory lines and tubes. 2. Improved ventilation since yesterday with regressed perihilar opacity.  Continued coarse interstitial opacity resembling that on 08/05/2019 which was also demonstrated by CTA at that time. 3. No new cardiopulmonary abnormality. Electronically Signed   By: Genevie Ann M.D.   On: 08/08/2019 10:48   DG CHEST PORT 1 VIEW  Result Date: 08/07/2019 CLINICAL DATA:  Tube placement, code EXAM: PORTABLE CHEST 1 VIEW COMPARISON:  06/05/2019 FINDINGS: Interval placement of endotracheal tube, tip projecting over the mid trachea. Esophagogastric tube, poorly visualized due to underpenetration although appears to be with tip and side port below the diaphragm. There is mild, diffuse interstitial pulmonary opacity. Layering pleural effusions seen on prior CT are not well appreciated. No new airspace opacity. IMPRESSION: 1. Interval placement of endotracheal tube, tip projecting over the mid trachea. 2. Esophagogastric tube, poorly visualized due to underpenetration although appears to be with tip and side port below the diaphragm. 3. There is mild, diffuse interstitial pulmonary opacity. Layering pleural effusions seen on prior CT are not well appreciated. No new airspace opacity. Electronically Signed   By: Eddie Candle M.D.   On: 08/07/2019 14:10   DG Chest Port 1 View  Result Date: 08/05/2019 CLINICAL DATA:  PT c/o worsening SOB since last night. Pt states he is normally somewhat SOB at baseline. Hx HTN, diabetes, COPD, asthma, former smoker EXAM: PORTABLE CHEST - 1 VIEW COMPARISON:  08/02/2019 FINDINGS: Increasing patchy airspace infiltrate laterally at the left lung base. Diffuse interstitial markings throughout both lungs as before. Heart size normal. Right paratracheal soft tissue consistent with adenopathy. The aortopulmonary window is obscured suggesting adenopathy or mass. Blunting of left lateral costophrenic angle. No pneumothorax. Visualized bones unremarkable. IMPRESSION: 1. Increasing patchy airspace infiltrate laterally at the left lung base suspicious for pneumonia. 2. Probable  mediastinal adenopathy. 3. Diffuse interstitial markings throughout both lungs as before. Electronically Signed   By: Lucrezia Europe M.D.   On: 08/05/2019 09:12   DG Abd Portable 1V  Result Date: 08/09/2019 CLINICAL DATA:  OG tube placement EXAM: PORTABLE ABDOMEN - 1 VIEW COMPARISON:  Concurrent chest radiograph, CT abdomen pelvis 08/06/2019 FINDINGS: Tip and side port of the transesophageal tube or distal to the GE junction, terminating in the region of the gastric antrum/duodenal bulb. Bowel gas pattern is nonobstructive. No acute soft tissue abnormality is seen. Osseous structures are unremarkable. IMPRESSION: 1. Tip of the transesophageal tube in the region of the gastric antrum/duodenal bulb. 2. Nonobstructive bowel gas pattern. Electronically Signed   By: Lovena Le M.D.   On: 08/09/2019 14:41   CT BONE MARROW BIOPSY  Result Date: 08/13/2019 CLINICAL DATA:  History of CLL with clinical evidence of progression. Bone marrow biopsy required for further evaluation. EXAM: CT GUIDED BONE MARROW ASPIRATION AND BIOPSY ANESTHESIA/SEDATION: Versed 1.5 mg IV, Fentanyl 75 mcg IV Total Moderate Sedation Time:   18 minutes. The patient's level of consciousness and physiologic status were continuously monitored during the procedure by Radiology nursing. PROCEDURE: The procedure risks, benefits, and alternatives were explained to the patient. Questions regarding the procedure were encouraged and answered. The patient understands and consents to the procedure. A time out was performed prior to initiating the procedure. The right gluteal region was prepped with chlorhexidine. Sterile gown and sterile gloves were used for the procedure. Local anesthesia was provided with 1% Lidocaine. Under CT guidance, an 11 gauge On Control bone cutting needle was advanced from a posterior approach into the right iliac bone. Needle positioning was confirmed  with CT. Initial non heparinized and heparinized aspirate samples were obtained  of bone marrow. Core biopsy was performed via the On Control drill needle. COMPLICATIONS: None FINDINGS: Inspection of initial aspirate did reveal visible particles. Intact core biopsy sample was obtained. IMPRESSION: CT guided bone marrow biopsy of right posterior iliac bone with both aspirate and core samples obtained. Electronically Signed   By: Aletta Edouard M.D.   On: 08/13/2019 15:20   ECHOCARDIOGRAM COMPLETE  Result Date: 08/08/2019   ECHOCARDIOGRAM REPORT   Patient Name:   Kesley AIDON KLEMENS Date of Exam: 08/08/2019 Medical Rec #:  [id number]            Height:       70.0 in Accession #:    [id number]           Weight:       223.3 lb Date of Birth:  30-Jan-1971            BSA:          2.19 m Patient Age:    11 years             BP:           119/61 mmHg Patient Gender: M                    HR:           68 bpm. Exam Location:  Inpatient Procedure: 2D Echo Indications:    Cardiac arrest I46.9  History:        Patient has prior history of Echocardiogram examinations, most                 recent 04/28/2017. COPD, Signs/Symptoms:Hypotension; Risk                 Factors:Diabetes. Acute on chronic respiratory failure.  Sonographer:    Vikki Ports Turrentine Referring Phys: [id number] Francesca Jewett  Sonographer Comments: Technically difficult study due to poor echo windows and echo performed with patient supine and on artificial respirator. Image acquisition challenging due to patient body habitus. IMPRESSIONS  1. Left ventricular ejection fraction, by visual estimation, is 55 to 60%. The left ventricle has normal function. There is no left ventricular hypertrophy.  2. Left ventricular diastolic parameters are consistent with Grade I diastolic dysfunction (impaired relaxation).  3. The left ventricle has no regional wall motion abnormalities.  4. Global right ventricle has mildly reduced systolic function.The right ventricular size is normal. No increase in right ventricular wall thickness.  5. Left atrial size  was mildly dilated.  6. Right atrial size was normal.  7. Trivial pericardial effusion is present.  8. The mitral valve is grossly normal. Trivial mitral valve regurgitation.  9. The tricuspid valve is grossly normal. Tricuspid valve regurgitation is trivial. 10. The aortic valve is tricuspid. Aortic valve regurgitation is not visualized. 11. The pulmonic valve was grossly normal. Pulmonic valve regurgitation is trivial. 12. Moderately elevated pulmonary artery systolic pressure. 13. The inferior vena cava is dilated in size with <50% respiratory variability, suggesting right atrial pressure of 15 mmHg. FINDINGS  Left Ventricle: Left ventricular ejection fraction, by visual estimation, is 55 to 60%. The left ventricle has normal function. The left ventricle has no regional wall motion abnormalities. There is no left ventricular hypertrophy. Left ventricular diastolic parameters are consistent with Grade I diastolic dysfunction (impaired relaxation). Indeterminate filling pressures. Right Ventricle: The right ventricular size is normal. No increase in right ventricular wall thickness.  Global RV systolic function is has mildly reduced systolic function. The tricuspid regurgitant velocity is 2.65 m/s, and with an assumed right atrial pressure of 15 mmHg, the estimated right ventricular systolic pressure is moderately elevated at 43.1 mmHg. Left Atrium: Left atrial size was mildly dilated. Right Atrium: Right atrial size was normal in size Pericardium: Trivial pericardial effusion is present. Mitral Valve: The mitral valve is grossly normal. Trivial mitral valve regurgitation. Tricuspid Valve: The tricuspid valve is grossly normal. Tricuspid valve regurgitation is trivial. Aortic Valve: The aortic valve is tricuspid. Aortic valve regurgitation is not visualized. Pulmonic Valve: The pulmonic valve was grossly normal. Pulmonic valve regurgitation is trivial. Pulmonic regurgitation is trivial. Aorta: The aortic root and  ascending aorta are structurally normal, with no evidence of dilitation. Venous: IVC assessment for right atrial pressure unable to be performed due to mechanical ventilation. The inferior vena cava is dilated in size with less than 50% respiratory variability, suggesting right atrial pressure of 15 mmHg. IAS/Shunts: No atrial level shunt detected by color flow Doppler.  LEFT VENTRICLE PLAX 2D LVIDd:         4.80 cm  Diastology LVIDs:         3.30 cm  LV e' lateral:   10.40 cm/s LV PW:         1.00 cm  LV E/e' lateral: 12.9 LV IVS:        1.00 cm  LV e' medial:    7.83 cm/s LVOT diam:     2.10 cm  LV E/e' medial:  17.1 LV SV:         63 ml LV SV Index:   27.96 LVOT Area:     3.46 cm  LEFT ATRIUM             Index       RIGHT ATRIUM           Index LA diam:        4.10 cm 1.87 cm/m  RA Area:     16.90 cm LA Vol (A2C):   44.2 ml 20.21 ml/m RA Volume:   45.60 ml  20.85 ml/m LA Vol (A4C):   79.0 ml 36.11 ml/m LA Biplane Vol: 62.3 ml 28.48 ml/m  AORTIC VALVE LVOT Vmax:   112.00 cm/s LVOT Vmean:  82.200 cm/s LVOT VTI:    0.215 m  AORTA Ao Root diam: 3.40 cm MITRAL VALVE                         TRICUSPID VALVE MV Area (PHT): 3.54 cm              TR Peak grad:   28.1 mmHg MV PHT:        62.06 msec            TR Vmax:        265.00 cm/s MV Decel Time: 214 msec MV E velocity: 134.00 cm/s 103 cm/s  SHUNTS MV A velocity: 115.00 cm/s 70.3 cm/s Systemic VTI:  0.22 m MV E/A ratio:  1.17        1.5       Systemic Diam: 2.10 cm  Lyman Bishop MD Electronically signed by Lyman Bishop MD Signature Date/Time: 08/08/2019/11:37:42 AM    Final     ASSESSMENT AND PLAN: This is a very pleasant 49 years old white male with CLL/small lymphocytic lymphoma diagnosed in 2010 and has been in observation for several years but the patient  had significant disease progression and August 2020. He is currently undergoing treatment with obinutuzumab every 4 weeks in addition to acalabrutinib 100 mg p.o. twice daily.  The patient tolerated  the first months of his treatment fairly well.  He is expected to start second cycle of this treatment on September 08, 2019. The patient had a recent PET scan for initial staging of his disease and the scan showed significant improvement in the lymphadenopathy after the first dose of treatment with obinutuzumab and 1 week of acalabrutinib. I personally and independently reviewed the scans and discussed the results with the patient today. I recommended for him to continue his treatment as planned and he will proceed with cycle #2 on September 08, 2019. He will come back for follow-up visit at that time. For mouth sores, I advised the patient to continue on the Magic mouthwash in addition to salt water and Biotene if needed. He was advised to call immediately if he has any other concerning symptoms in the interval. The patient voices understanding of current disease status and treatment options and is in agreement with the current care plan.  All questions were answered. The patient knows to call the clinic with any problems, questions or concerns. We can certainly see the patient much sooner if necessary.   Disclaimer: This note was dictated with voice recognition software. Similar sounding words can inadvertently be transcribed and may not be corrected upon review.

## 2019-09-02 ENCOUNTER — Telehealth: Payer: Self-pay | Admitting: Internal Medicine

## 2019-09-02 NOTE — Telephone Encounter (Signed)
Scheduled per los. Called and left msg. Mailed printout  °

## 2019-09-07 LAB — FUNGUS CULTURE RESULT

## 2019-09-07 LAB — FUNGUS CULTURE WITH STAIN

## 2019-09-07 LAB — FUNGAL ORGANISM REFLEX

## 2019-09-08 ENCOUNTER — Encounter: Payer: Self-pay | Admitting: Internal Medicine

## 2019-09-08 ENCOUNTER — Other Ambulatory Visit: Payer: Self-pay

## 2019-09-08 ENCOUNTER — Inpatient Hospital Stay: Payer: Medicare Other

## 2019-09-08 ENCOUNTER — Inpatient Hospital Stay (HOSPITAL_BASED_OUTPATIENT_CLINIC_OR_DEPARTMENT_OTHER): Payer: Medicare Other | Admitting: Internal Medicine

## 2019-09-08 VITALS — BP 142/71 | HR 93 | Temp 98.0°F | Resp 17

## 2019-09-08 VITALS — BP 164/72 | HR 109 | Temp 98.7°F | Resp 19 | Ht 70.0 in | Wt 215.6 lb

## 2019-09-08 DIAGNOSIS — C9110 Chronic lymphocytic leukemia of B-cell type not having achieved remission: Secondary | ICD-10-CM | POA: Diagnosis not present

## 2019-09-08 DIAGNOSIS — Z5112 Encounter for antineoplastic immunotherapy: Secondary | ICD-10-CM | POA: Diagnosis not present

## 2019-09-08 DIAGNOSIS — D519 Vitamin B12 deficiency anemia, unspecified: Secondary | ICD-10-CM

## 2019-09-08 DIAGNOSIS — I10 Essential (primary) hypertension: Secondary | ICD-10-CM | POA: Diagnosis not present

## 2019-09-08 LAB — CBC WITH DIFFERENTIAL (CANCER CENTER ONLY)
Abs Immature Granulocytes: 0.05 10*3/uL (ref 0.00–0.07)
Basophils Absolute: 0 10*3/uL (ref 0.0–0.1)
Basophils Relative: 0 %
Eosinophils Absolute: 0 10*3/uL (ref 0.0–0.5)
Eosinophils Relative: 0 %
HCT: 36.6 % — ABNORMAL LOW (ref 39.0–52.0)
Hemoglobin: 11.9 g/dL — ABNORMAL LOW (ref 13.0–17.0)
Immature Granulocytes: 1 %
Lymphocytes Relative: 52 %
Lymphs Abs: 3.4 10*3/uL (ref 0.7–4.0)
MCH: 31.9 pg (ref 26.0–34.0)
MCHC: 32.5 g/dL (ref 30.0–36.0)
MCV: 98.1 fL (ref 80.0–100.0)
Monocytes Absolute: 0.2 10*3/uL (ref 0.1–1.0)
Monocytes Relative: 2 %
Neutro Abs: 3 10*3/uL (ref 1.7–7.7)
Neutrophils Relative %: 45 %
Platelet Count: 194 10*3/uL (ref 150–400)
RBC: 3.73 MIL/uL — ABNORMAL LOW (ref 4.22–5.81)
RDW: 19.8 % — ABNORMAL HIGH (ref 11.5–15.5)
WBC Count: 6.6 10*3/uL (ref 4.0–10.5)
nRBC: 0 % (ref 0.0–0.2)

## 2019-09-08 LAB — CMP (CANCER CENTER ONLY)
ALT: 35 U/L (ref 0–44)
AST: 13 U/L — ABNORMAL LOW (ref 15–41)
Albumin: 3.8 g/dL (ref 3.5–5.0)
Alkaline Phosphatase: 87 U/L (ref 38–126)
Anion gap: 11 (ref 5–15)
BUN: 27 mg/dL — ABNORMAL HIGH (ref 6–20)
CO2: 27 mmol/L (ref 22–32)
Calcium: 9.5 mg/dL (ref 8.9–10.3)
Chloride: 96 mmol/L — ABNORMAL LOW (ref 98–111)
Creatinine: 0.89 mg/dL (ref 0.61–1.24)
GFR, Est AFR Am: >60 mL/min (ref >60)
GFR, Estimated: >60 mL/min (ref >60)
Glucose, Bld: 209 mg/dL — ABNORMAL HIGH (ref 70–99)
Potassium: 4.3 mmol/L (ref 3.5–5.1)
Sodium: 134 mmol/L — ABNORMAL LOW (ref 135–145)
Total Bilirubin: 0.4 mg/dL (ref 0.3–1.2)
Total Protein: 6.3 g/dL — ABNORMAL LOW (ref 6.5–8.1)

## 2019-09-08 LAB — URIC ACID: Uric Acid, Serum: 3.3 mg/dL — ABNORMAL LOW (ref 3.7–8.6)

## 2019-09-08 LAB — LACTATE DEHYDROGENASE: LDH: 170 U/L (ref 98–192)

## 2019-09-08 MED ORDER — SODIUM CHLORIDE 0.9 % IV SOLN
1000.0000 mg | Freq: Once | INTRAVENOUS | Status: AC
  Administered 2019-09-08: 1000 mg via INTRAVENOUS
  Filled 2019-09-08: qty 40

## 2019-09-08 MED ORDER — LORAZEPAM 2 MG/ML IJ SOLN
0.5000 mg | Freq: Once | INTRAMUSCULAR | Status: AC
  Administered 2019-09-08: 0.5 mg via INTRAVENOUS

## 2019-09-08 MED ORDER — DEXAMETHASONE SODIUM PHOSPHATE 10 MG/ML IJ SOLN
10.0000 mg | Freq: Once | INTRAMUSCULAR | Status: AC
  Administered 2019-09-08: 10 mg via INTRAVENOUS

## 2019-09-08 MED ORDER — DIPHENHYDRAMINE HCL 50 MG/ML IJ SOLN
INTRAMUSCULAR | Status: AC
  Filled 2019-09-08: qty 1

## 2019-09-08 MED ORDER — DIPHENHYDRAMINE HCL 50 MG/ML IJ SOLN
50.0000 mg | Freq: Once | INTRAMUSCULAR | Status: AC
  Administered 2019-09-08: 50 mg via INTRAVENOUS

## 2019-09-08 MED ORDER — SODIUM CHLORIDE 0.9 % IV SOLN
20.0000 mg | Freq: Once | INTRAVENOUS | Status: DC
  Filled 2019-09-08: qty 2

## 2019-09-08 MED ORDER — SODIUM CHLORIDE 0.9 % IV SOLN
Freq: Once | INTRAVENOUS | Status: AC
  Filled 2019-09-08: qty 250

## 2019-09-08 MED ORDER — ACETAMINOPHEN 325 MG PO TABS
ORAL_TABLET | ORAL | Status: AC
  Filled 2019-09-08: qty 2

## 2019-09-08 MED ORDER — ACETAMINOPHEN 325 MG PO TABS
650.0000 mg | ORAL_TABLET | Freq: Once | ORAL | Status: AC
  Administered 2019-09-08: 650 mg via ORAL

## 2019-09-08 MED ORDER — LORAZEPAM 2 MG/ML IJ SOLN
INTRAMUSCULAR | Status: AC
  Filled 2019-09-08: qty 1

## 2019-09-08 MED ORDER — DEXAMETHASONE SODIUM PHOSPHATE 10 MG/ML IJ SOLN
INTRAMUSCULAR | Status: AC
  Filled 2019-09-08: qty 1

## 2019-09-08 NOTE — Patient Instructions (Signed)
Obinutuzumab injection What is this medicine? OBINUTUZUMAB (OH bi nue TOOZ ue mab) is a monoclonal antibody. It is used to treat chronic lymphocytic leukemia (CLL) and a type of non-Hodgkin lymphoma (NHL), follicular lymphoma. This medicine may be used for other purposes; ask your health care provider or pharmacist if you have questions. COMMON BRAND NAME(S): GAZYVA What should I tell my health care provider before I take this medicine? They need to know if you have any of these conditions:  infection (especially a virus infection such as hepatitis B virus)  lung or breathing disease  heart disease  take medicines that treat or prevent blood clots  an unusual or allergic reaction to obinutuzumab, other medicines, foods, dyes, or preservatives  pregnant or trying to get pregnant  breast-feeding How should I use this medicine? This medicine is for infusion into a vein. It is given by a health care professional in a hospital or clinic setting. Talk to your pediatrician regarding the use of this medicine in children. Special care may be needed. Overdosage: If you think you have taken too much of this medicine contact a poison control center or emergency room at once. NOTE: This medicine is only for you. Do not share this medicine with others. What if I miss a dose? Keep appointments for follow-up doses as directed. It is important not to miss your dose. Call your doctor or health care professional if you are unable to keep an appointment. What may interact with this medicine?  live virus vaccines This list may not describe all possible interactions. Give your health care provider a list of all the medicines, herbs, non-prescription drugs, or dietary supplements you use. Also tell them if you smoke, drink alcohol, or use illegal drugs. Some items may interact with your medicine. What should I watch for while using this medicine? Report any side effects that you notice during your  treatment right away, such as changes in your breathing, fever, chills, dizziness or lightheadedness. These effects are more common with the first dose. Visit your prescriber or health care professional for checks on your progress. You will need to have regular blood work. Report any other side effects. The side effects of this medicine can continue after you finish your treatment. Continue your course of treatment even though you feel ill unless your doctor tells you to stop. Call your doctor or health care professional for advice if you get a fever, chills or sore throat, or other symptoms of a cold or flu. Do not treat yourself. This drug decreases your body's ability to fight infections. Try to avoid being around people who are sick. This medicine may increase your risk to bruise or bleed. Call your doctor or health care professional if you notice any unusual bleeding. Do not become pregnant while taking this medicine or for 6 months after stopping it. Women should inform their doctor if they wish to become pregnant or think they might be pregnant. There is a potential for serious side effects to an unborn child. Talk to your health care professional or pharmacist for more information. Do not breast-feed an infant while taking this medicine or for 6 months after stopping it. What side effects may I notice from receiving this medicine? Side effects that you should report to your doctor or health care professional as soon as possible:  allergic reactions like skin rash, itching or hives, swelling of the face, lips, or tongue  breathing problems  changes in vision  chest pain or chest  tightness  confusion  dizziness  loss of balance or coordination  low blood counts - this medicine may decrease the number of white blood cells, red blood cells and platelets. You may be at increased risk for infections and bleeding.  signs of decreased platelets or bleeding - bruising, pinpoint red spots on  the skin, black, tarry stools, blood in the urine  signs of infection - fever or chills, cough, sore throat, pain or trouble passing urine  signs and symptoms of liver injury like dark yellow or brown urine; general ill feeling or flu-like symptoms; light-colored stools; loss of appetite; nausea; right upper belly pain; unusually weak or tired; yellowing of the eyes or skin  trouble speaking or understanding  trouble walking  vomiting Side effects that usually do not require medical attention (report to your doctor or health care professional if they continue or are bothersome):  constipation  joint pain  muscle pain This list may not describe all possible side effects. Call your doctor for medical advice about side effects. You may report side effects to FDA at 1-800-FDA-1088. Where should I keep my medicine? This drug is only given in a hospital or clinic and will not be stored at home. NOTE: This sheet is a summary. It may not cover all possible information. If you have questions about this medicine, talk to your doctor, pharmacist, or health care provider.  2020 Elsevier/Gold Standard (2018-11-24 15:34:53)  Coronavirus (COVID-19) Are you at risk?  Are you at risk for the Coronavirus (COVID-19)?  To be considered HIGH RISK for Coronavirus (COVID-19), you have to meet the following criteria:  . Traveled to [place], [place], [place], [place] or [place]; or in the [place] to [place], [place], [place], or [place]; and have fever, cough, and shortness of breath within the last 2 weeks of travel OR . Been in close contact with a person diagnosed with COVID-19 within the last 2 weeks and have fever, cough, and shortness of breath . IF YOU DO NOT MEET THESE CRITERIA, YOU ARE CONSIDERED LOW RISK FOR COVID-19.  What to do if you are HIGH RISK for COVID-19?  Marland Kitchen If you are having a medical emergency, call 911. . Seek medical care right away. Before you go to a doctor's office,  urgent care or emergency department, call ahead and tell them about your recent travel, contact with someone diagnosed with COVID-19, and your symptoms. You should receive instructions from your physician's office regarding next steps of care.  . When you arrive at healthcare provider, tell the healthcare staff immediately you have returned from visiting [place], [place], [place], [place] or [place]; or traveled in the [place] to [place], [place], [place], or [place]; in the last two weeks or you have been in close contact with a person diagnosed with COVID-19 in the last 2 weeks.   . Tell the health care staff about your symptoms: fever, cough and shortness of breath. . After you have been seen by a medical provider, you will be either: o Tested for (COVID-19) and discharged home on quarantine except to seek medical care if symptoms worsen, and asked to  - Stay home and avoid contact with others until you get your results (4-5 days)  - Avoid travel on public transportation if possible (such as bus, train, or airplane) or o Sent to the Emergency Department by EMS for evaluation, COVID-19 testing, and possible admission depending on your condition and test results.  What to  do if you are LOW RISK for COVID-19?  Reduce your risk of any infection by using the same precautions used for avoiding the common cold or flu:  Marland Kitchen Wash your hands often with soap and warm water for at least 20 seconds.  If soap and water are not readily available, use an alcohol-based hand sanitizer with at least 60% alcohol.  . If coughing or sneezing, cover your mouth and nose by coughing or sneezing into the elbow areas of your shirt or coat, into a tissue or into your sleeve (not your hands). . Avoid shaking hands with others and consider head nods or verbal greetings only. . Avoid touching your eyes, nose, or mouth with unwashed hands.  . Avoid close contact with people who are sick. . Avoid places or events with  large numbers of people in one location, like concerts or sporting events. . Carefully consider travel plans you have or are making. . If you are planning any travel outside or inside the [place], visit the CDC's Travelers' Health webpage for the latest health notices. . If you have some symptoms but not all symptoms, continue to monitor at home and seek medical attention if your symptoms worsen. . If you are having a medical emergency, call 911.   [place] / e-Visit: [email or web address]         [facility]: [facility]: [id number]                   [facility]: [phone]

## 2019-09-08 NOTE — Progress Notes (Signed)
[place] [phone]   [phone]  OFFICE PROGRESS NOTE  Larry Coffin, MD [place] [address]  DIAGNOSIS: CLL/small lymphocytic lymphoma diagnosed in 2010 and has been in observation for several years with no treatment. 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 The cytogenetics were negative for P 17.   PRIOR THERAPY: None  CURRENT THERAPY: Obinutuzumab 1,000 mg IV. First dose on 08/10/2019. He will also begin acalabrutinib 100 mg BID starting on 08/19/2019.  Status post one cycle.  INTERVAL HISTORY: Larry Lam 49 y.o. male returns to the clinic today for follow-up visit.  The patient is feeling fine today with no concerning complaints.  He denied having any chest pain, shortness of breath except with exertion with no cough or hemoptysis.  He has occasional nausea in the morning but did not have to take any antiemetics.  The patient denied having any headache or visual changes.  He has no fever or chills.  He has been tolerating his treatment with obinutuzumab and acalabrutinib fairly well.  He is here today for evaluation before starting cycle #2 of his treatment.  MEDICAL HISTORY: Past Medical History:  Diagnosis Date  . Asthma   . COPD (chronic obstructive pulmonary disease) ([place])   . Diabetes mellitus without complication ([place])   . Hypertension   . Seizures ([place])    3-4 years ago. Single episode.  . Tobacco use     ALLERGIES:  is allergic to amlodipine besylate-valsartan and hydrochlorothiazide.  MEDICATIONS:  Current Outpatient Medications  Medication Sig Dispense Refill  . acalabrutinib (CALQUENCE) 100 MG capsule Take 1 capsule (100 mg total) by mouth 2 (two) times daily. 60 capsule 2  . albuterol (PROVENTIL HFA;VENTOLIN HFA) 108 (90 Base) MCG/ACT inhaler Inhale 2 puffs into the lungs every 6 (six) hours as needed for wheezing or shortness of breath.  1 Inhaler 0  . allopurinol (ZYLOPRIM) 100 MG tablet Take 1 tablet (100 mg total) by mouth 2 (two) times daily. 60 tablet 0  . ASPIRIN 81 PO Take 81 mg by mouth.    Marland Kitchen atorvastatin (LIPITOR) 40 MG tablet Take 1 tablet by mouth daily.    Marland Kitchen escitalopram (LEXAPRO) 20 MG tablet Take 20 mg by mouth daily.    . Fluticasone-Salmeterol (ADVAIR) 250-50 MCG/DOSE AEPB Inhale into the lungs.    . Insulin Detemir (LEVEMIR) 100 UNIT/ML Pen Inject 30 Units into the skin daily. 18 mL 0  . Insulin Pen Needle (PEN NEEDLES 3/16") 31G X 5 MM MISC Use as directed with insulin pen 100 each 0  . ipratropium-albuterol (DUONEB) 0.5-2.5 (3) MG/3ML SOLN Take 3 mLs by nebulization every 6 (six) hours as needed. J44.9 and J44.1 360 mL 0  . magic mouthwash SOLN Take 5 mLs by mouth 4 (four) times daily as needed for mouth pain. 240 mL 0  . pantoprazole (PROTONIX) 40 MG tablet Take 1 tablet (40 mg total) by mouth daily. 30 tablet 11  . predniSONE (DELTASONE) 50 MG tablet Please take 4 tablets (80 mg) by mouth daily for 7 days starting on 08/20/2019, then take 3 tablets (60 mg) by mouth daily for 7 days, followed by 2 tablets (40 mg) by mouth daily for 7 days, followed by 1 tablet (20 mg) daily for 7 days 70 tablet 0  . umeclidinium bromide (INCRUSE ELLIPTA) 62.5 MCG/INH AEPB Inhale into the lungs.    . vitamin B-12 (CYANOCOBALAMIN) 1000  MCG tablet Take 1 tablet (1,000 mcg total) by mouth daily. 90 tablet 0   No current facility-administered medications for this visit.    SURGICAL HISTORY: History reviewed. No pertinent surgical history.  REVIEW OF SYSTEMS:  Constitutional: positive for fatigue Eyes: negative Ears, nose, mouth, throat, and face: negative Respiratory: positive for dyspnea on exertion Cardiovascular: negative Gastrointestinal: negative Genitourinary:negative Integument/breast: negative Hematologic/lymphatic: negative Musculoskeletal:negative Neurological: negative Behavioral/Psych: negative Endocrine:  negative Allergic/Immunologic: negative   PHYSICAL EXAMINATION: General appearance: alert, cooperative, fatigued and no distress Head: Normocephalic, without obvious abnormality, atraumatic Neck: no adenopathy, no JVD, supple, symmetrical, trachea midline and thyroid not enlarged, symmetric, no tenderness/mass/nodules Lymph nodes: Cervical, supraclavicular, and axillary nodes normal. Resp: clear to auscultation bilaterally Back: symmetric, no curvature. ROM normal. No CVA tenderness. Cardio: regular rate and rhythm, S1, S2 normal, no murmur, click, rub or gallop GI: soft, non-tender; bowel sounds normal; no masses,  no organomegaly Extremities: extremities normal, atraumatic, no cyanosis or edema Neurologic: Alert and oriented X 3, normal strength and tone. Normal symmetric reflexes. Normal coordination and gait  ECOG PERFORMANCE STATUS: 1 - Symptomatic but completely ambulatory  Blood pressure (!) 164/72, pulse (!) 109, temperature 98.7 F (37.1 C), temperature source Temporal, resp. rate 19, height 5' 10" (1.778 m), weight 215 lb 9.6 oz (97.8 kg), SpO2 96 %.  LABORATORY DATA: Lab Results  Component Value Date   WBC 4.1 09/01/2019   HGB 9.9 (L) 09/01/2019   HCT 30.3 (L) 09/01/2019   MCV 95.6 09/01/2019   PLT 107 (L) 09/01/2019      Chemistry      Component Value Date/Time   NA 138 09/01/2019 0753   NA 137 07/06/2012 0506   K 4.4 09/01/2019 0753   K 4.0 07/06/2012 0506   CL 100 09/01/2019 0753   CL 104 07/06/2012 0506   CO2 28 09/01/2019 0753   CO2 25 07/06/2012 0506   BUN 24 (H) 09/01/2019 0753   BUN 12 07/06/2012 0506   CREATININE 0.90 09/01/2019 0753   CREATININE 0.62 07/06/2012 0506      Component Value Date/Time   CALCIUM 9.0 09/01/2019 0753   CALCIUM 8.6 07/06/2012 0506   ALKPHOS 91 09/01/2019 0753   ALKPHOS 96 07/06/2012 0506   AST 10 (L) 09/01/2019 0753   ALT 26 09/01/2019 0753   ALT 30 07/06/2012 0506   BILITOT 0.5 09/01/2019 0753       RADIOGRAPHIC  STUDIES: DG Chest 1 View  Result Date: 08/09/2019 CLINICAL DATA:  ETT placement EXAM: CHEST  1 VIEW COMPARISON:  Radiographs 08/09/2019 FINDINGS: Endotracheal tube in the mid trachea, 4.5 cm from the carina. Transesophageal tube tip beyond the GE junction curling in the left upper quadrant, terminating below the level of imaging. Diffuse interstitial pulmonary opacity throughout both lungs with few more patchy areas of opacity. Some right basilar pleural thickening compatible with fusion. Suspect at least trace left effusion as well. Cardiomediastinal contours are stable. No acute osseous or soft tissue abnormality. IMPRESSION: 1. Endotracheal tube 4.5 cm from the carina. 2. Transesophageal tube tip curls in the left upper quadrant, terminating below the level of imaging. 3. Diffuse interstitial airspace opacities compatible with infection and/or edema. Overall similar in extent to comparison studies. Electronically Signed   By: Lovena Le M.D.   On: 08/09/2019 14:40   NM PET Image Restag (PS) Skull Base To Thigh  Result Date: 08/25/2019 CLINICAL DATA:  Initial treatment strategy for CLL with rapid progression of bulky adenopathy worrisome for Richter transformation.  Undergoing treatment. EXAM: NUCLEAR MEDICINE PET SKULL BASE TO THIGH TECHNIQUE: 9.8 mCi F-18 FDG was injected intravenously. Full-ring PET imaging was performed from the skull base to thigh after the radiotracer. CT data was obtained and used for attenuation correction and anatomic localization. Fasting blood glucose: 197 mg/dl COMPARISON:  CT scan 08/06/2019 FINDINGS: Mediastinal blood pool activity: SUV max 2.42 Liver activity: SUV max 3.68 NECK: Small multi station neck adenopathy but no hypermetabolism. Incidental CT findings: none CHEST: Supraclavicular, axillary and subpectoral adenopathy, slightly decreased in size since the recent CT examinations. Left axillary node on image 61/4 measures 18.5 mm and previously measured 27 mm. 25 mm  right axillary node on image 71/4 previously measured 35 mm. Right paratracheal lymph node on image 69/4 measures 15 mm and previously measured 22.5 mm. 10 mm prevascular lymph node on image 70/for previously measured 18 mm. No hypermetabolism is demonstrated. No worrisome pulmonary lesions or pleural effusion. The pleural effusion seen on the prior CT have resolved. Incidental CT findings: none ABDOMEN/PELVIS: Gastrohepatic ligament, celiac axis, mesenteric and retroperitoneal adenopathy all demonstrating interval decrease in size. No hypermetabolism. Bulky pelvic lymphadenopathy and inguinal lymphadenopathy also decreased in size since the prior study. No hypermetabolism. 21 mm right obturator lymph node on image 180/for previously measured 33 mm. 21.5 mm right axillary node on image 196/4 previously measured 31 mm. Incidental CT findings: none SKELETON: No significant osseous findings. Uptake noted at the costochondral junction of the ribs, likely inflammatory. Incidental CT findings: none IMPRESSION: 1. Interval decrease in size of the diffuse adenopathy in the neck, chest, abdomen and pelvis when compared to the recent prior CT scans from 08/05/2019 and 08/06/2019. This would suggest a good, early response to treatment. 2. No hypermetabolism is demonstrated ([place] 1). 3. Resolution of pleural effusions. Electronically Signed   By: Marijo Sanes M.D.   On: 08/25/2019 14:23   DG Chest Port 1 View  Result Date: 08/12/2019 CLINICAL DATA:  Acute respiratory failure. EXAM: PORTABLE CHEST 1 VIEW COMPARISON:  08/11/2019 FINDINGS: Endotracheal tube and nasogastric tube have been removed. Persistent interstitial densities but there is improved aeration in the right lower lung. Heart size is within normal limits. Persistent hazy densities at the lung bases and the medial aspect of the left hemidiaphragm remains obscured. Negative for a pneumothorax. Bone structures are unremarkable. IMPRESSION: 1. Slightly  improved aeration in the lungs. Findings may represent decreasing pulmonary edema. 2. Persistent hazy densities at the lung base are suggestive for atelectasis and probable pleural effusions. Suspect focal volume loss or consolidation at the medial left lung base. Electronically Signed   By: Markus Daft M.D.   On: 08/12/2019 08:15   DG Chest Port 1 View  Result Date: 08/11/2019 CLINICAL DATA:  ETT, respiratory failure EXAM: PORTABLE CHEST 1 VIEW COMPARISON:  08/10/2019 FINDINGS: Interval increase in heterogeneous opacity of the right lung base and a probable layering pleural effusion. Otherwise unchanged mild, diffuse interstitial opacity. Endotracheal tube and esophagogastric tube are unchanged. IMPRESSION: 1. Interval increase in heterogeneous opacity of the right lung base and a probable layering pleural effusion. 2. Otherwise unchanged mild, diffuse interstitial opacity, in keeping with infection or edema. 3.  Endotracheal tube and esophagogastric tube are unchanged. Electronically Signed   By: Eddie Candle M.D.   On: 08/11/2019 09:00   DG Chest Port 1 View  Result Date: 08/10/2019 CLINICAL DATA:  Respiratory failure. EXAM: PORTABLE CHEST 1 VIEW COMPARISON:  August 09, 2019. FINDINGS: Stable cardiomediastinal silhouette. Endotracheal and nasogastric tubes are unchanged  in position. No pneumothorax or pleural effusion is noted. Stable bibasilar opacities are noted concerning for edema or possibly inflammation. Bony thorax is unremarkable. IMPRESSION: Stable support apparatus. Stable bibasilar opacities are noted concerning for edema or possibly inflammation. Electronically Signed   By: Marijo Conception M.D.   On: 08/10/2019 10:29   DG Abd Portable 1V  Result Date: 08/09/2019 CLINICAL DATA:  OG tube placement EXAM: PORTABLE ABDOMEN - 1 VIEW COMPARISON:  Concurrent chest radiograph, CT abdomen pelvis 08/06/2019 FINDINGS: Tip and side port of the transesophageal tube or distal to the GE junction,  terminating in the region of the gastric antrum/duodenal bulb. Bowel gas pattern is nonobstructive. No acute soft tissue abnormality is seen. Osseous structures are unremarkable. IMPRESSION: 1. Tip of the transesophageal tube in the region of the gastric antrum/duodenal bulb. 2. Nonobstructive bowel gas pattern. Electronically Signed   By: Lovena Le M.D.   On: 08/09/2019 14:41   CT BONE MARROW BIOPSY  Result Date: 08/13/2019 CLINICAL DATA:  History of CLL with clinical evidence of progression. Bone marrow biopsy required for further evaluation. EXAM: CT GUIDED BONE MARROW ASPIRATION AND BIOPSY ANESTHESIA/SEDATION: Versed 1.5 mg IV, Fentanyl 75 mcg IV Total Moderate Sedation Time:   18 minutes. The patient's level of consciousness and physiologic status were continuously monitored during the procedure by Radiology nursing. PROCEDURE: The procedure risks, benefits, and alternatives were explained to the patient. Questions regarding the procedure were encouraged and answered. The patient understands and consents to the procedure. A time out was performed prior to initiating the procedure. The right gluteal region was prepped with chlorhexidine. Sterile gown and sterile gloves were used for the procedure. Local anesthesia was provided with 1% Lidocaine. Under CT guidance, an 11 gauge On Control bone cutting needle was advanced from a posterior approach into the right iliac bone. Needle positioning was confirmed with CT. Initial non heparinized and heparinized aspirate samples were obtained of bone marrow. Core biopsy was performed via the On Control drill needle. COMPLICATIONS: None FINDINGS: Inspection of initial aspirate did reveal visible particles. Intact core biopsy sample was obtained. IMPRESSION: CT guided bone marrow biopsy of right posterior iliac bone with both aspirate and core samples obtained. Electronically Signed   By: Aletta Edouard M.D.   On: 08/13/2019 15:20    ASSESSMENT AND PLAN: This is  a very pleasant 49 years old white male with CLL/small lymphocytic lymphoma diagnosed in 2010 and has been in observation for several years but the patient had significant disease progression and August 2020. He is currently undergoing treatment with obinutuzumab every 4 weeks in addition to acalabrutinib 100 mg p.o. twice daily.   The patient has been tolerating this treatment well with no concerning adverse effects. I recommended for him to proceed with cycle #2 today as planned. For hypertension he was advised to take his blood pressure medication as prescribed and to monitor it closely at home. For mouth sores, I advised the patient to continue on the Magic mouthwash in addition to salt water and Biotene if needed. I will see the patient back for follow-up visit in 4 weeks for evaluation before starting cycle #3. He was advised to call immediately if he has any concerning symptoms in the interval. The patient voices understanding of current disease status and treatment options and is in agreement with the current care plan.  All questions were answered. The patient knows to call the clinic with any problems, questions or concerns. We can certainly see the patient much  sooner if necessary.   Disclaimer: This note was dictated with voice recognition software. Similar sounding words can inadvertently be transcribed and may not be corrected upon review.

## 2019-09-08 NOTE — Progress Notes (Signed)
Per Dr. Julien Nordmann: pt can receive 0.5 mg Ativan IV today for RLS d/t IV Benadryl. Benadryl can also be decreased to 25 mg IV for future treatments. Pharmacy notified and will update treatment plan

## 2019-09-08 NOTE — Progress Notes (Signed)
MD would like Dex 20 mg IV discontinued and pt will get Dex 10 mg IV push prior to [facility].  Kennith Center, Pharm.D., CPP 09/08/2019@9 :38 AM

## 2019-09-09 ENCOUNTER — Telehealth: Payer: Self-pay | Admitting: Internal Medicine

## 2019-09-09 NOTE — Telephone Encounter (Signed)
Scheduled per los. Called and left msg. Mailed printout  °

## 2019-09-14 LAB — PNEUMOCYSTIS JIROVECI SMEAR BY DFA: Pneumocystis jiroveci Ag: NEGATIVE

## 2019-10-06 ENCOUNTER — Encounter: Payer: Self-pay | Admitting: Internal Medicine

## 2019-10-06 ENCOUNTER — Inpatient Hospital Stay: Payer: Medicare Other

## 2019-10-06 ENCOUNTER — Other Ambulatory Visit: Payer: Self-pay

## 2019-10-06 ENCOUNTER — Inpatient Hospital Stay: Payer: Medicare Other | Attending: Physician Assistant | Admitting: Internal Medicine

## 2019-10-06 ENCOUNTER — Other Ambulatory Visit: Payer: Self-pay | Admitting: Physician Assistant

## 2019-10-06 VITALS — BP 143/75 | HR 124 | Temp 99.1°F | Resp 17 | Ht 70.0 in | Wt 227.7 lb

## 2019-10-06 VITALS — BP 145/70 | HR 90 | Temp 97.9°F | Resp 18

## 2019-10-06 DIAGNOSIS — I10 Essential (primary) hypertension: Secondary | ICD-10-CM

## 2019-10-06 DIAGNOSIS — F17210 Nicotine dependence, cigarettes, uncomplicated: Secondary | ICD-10-CM | POA: Diagnosis not present

## 2019-10-06 DIAGNOSIS — Z794 Long term (current) use of insulin: Secondary | ICD-10-CM | POA: Insufficient documentation

## 2019-10-06 DIAGNOSIS — E119 Type 2 diabetes mellitus without complications: Secondary | ICD-10-CM | POA: Insufficient documentation

## 2019-10-06 DIAGNOSIS — J45909 Unspecified asthma, uncomplicated: Secondary | ICD-10-CM | POA: Diagnosis not present

## 2019-10-06 DIAGNOSIS — Z7982 Long term (current) use of aspirin: Secondary | ICD-10-CM | POA: Insufficient documentation

## 2019-10-06 DIAGNOSIS — C9110 Chronic lymphocytic leukemia of B-cell type not having achieved remission: Secondary | ICD-10-CM

## 2019-10-06 DIAGNOSIS — E1165 Type 2 diabetes mellitus with hyperglycemia: Secondary | ICD-10-CM

## 2019-10-06 DIAGNOSIS — Z7951 Long term (current) use of inhaled steroids: Secondary | ICD-10-CM | POA: Insufficient documentation

## 2019-10-06 DIAGNOSIS — J449 Chronic obstructive pulmonary disease, unspecified: Secondary | ICD-10-CM | POA: Insufficient documentation

## 2019-10-06 DIAGNOSIS — Z5112 Encounter for antineoplastic immunotherapy: Secondary | ICD-10-CM | POA: Insufficient documentation

## 2019-10-06 DIAGNOSIS — R739 Hyperglycemia, unspecified: Secondary | ICD-10-CM

## 2019-10-06 DIAGNOSIS — Z79899 Other long term (current) drug therapy: Secondary | ICD-10-CM | POA: Insufficient documentation

## 2019-10-06 DIAGNOSIS — Z7952 Long term (current) use of systemic steroids: Secondary | ICD-10-CM | POA: Diagnosis not present

## 2019-10-06 LAB — URIC ACID: Uric Acid, Serum: 6.2 mg/dL (ref 3.7–8.6)

## 2019-10-06 LAB — CBC WITH DIFFERENTIAL (CANCER CENTER ONLY)
Abs Immature Granulocytes: 0.38 10*3/uL — ABNORMAL HIGH (ref 0.00–0.07)
Basophils Absolute: 0.1 10*3/uL (ref 0.0–0.1)
Basophils Relative: 1 %
Eosinophils Absolute: 0 10*3/uL (ref 0.0–0.5)
Eosinophils Relative: 1 %
HCT: 37.3 % — ABNORMAL LOW (ref 39.0–52.0)
Hemoglobin: 12.2 g/dL — ABNORMAL LOW (ref 13.0–17.0)
Immature Granulocytes: 6 %
Lymphocytes Relative: 37 %
Lymphs Abs: 2.4 10*3/uL (ref 0.7–4.0)
MCH: 29.8 pg (ref 26.0–34.0)
MCHC: 32.7 g/dL (ref 30.0–36.0)
MCV: 91.2 fL (ref 80.0–100.0)
Monocytes Absolute: 0.6 10*3/uL (ref 0.1–1.0)
Monocytes Relative: 10 %
Neutro Abs: 3 10*3/uL (ref 1.7–7.7)
Neutrophils Relative %: 45 %
Platelet Count: 312 10*3/uL (ref 150–400)
RBC: 4.09 MIL/uL — ABNORMAL LOW (ref 4.22–5.81)
RDW: 14.6 % (ref 11.5–15.5)
WBC Count: 6.6 10*3/uL (ref 4.0–10.5)
nRBC: 0 % (ref 0.0–0.2)

## 2019-10-06 LAB — CMP (CANCER CENTER ONLY)
ALT: 12 U/L (ref 0–44)
AST: 9 U/L — ABNORMAL LOW (ref 15–41)
Albumin: 3.6 g/dL (ref 3.5–5.0)
Alkaline Phosphatase: 102 U/L (ref 38–126)
Anion gap: 13 (ref 5–15)
BUN: 19 mg/dL (ref 6–20)
CO2: 24 mmol/L (ref 22–32)
Calcium: 9.5 mg/dL (ref 8.9–10.3)
Chloride: 99 mmol/L (ref 98–111)
Creatinine: 0.96 mg/dL (ref 0.61–1.24)
GFR, Est AFR Am: >60 mL/min (ref >60)
GFR, Estimated: >60 mL/min (ref >60)
Glucose, Bld: 384 mg/dL — ABNORMAL HIGH (ref 70–99)
Potassium: 4.6 mmol/L (ref 3.5–5.1)
Sodium: 136 mmol/L (ref 135–145)
Total Bilirubin: <0.2 mg/dL — ABNORMAL LOW (ref 0.3–1.2)
Total Protein: 6.9 g/dL (ref 6.5–8.1)

## 2019-10-06 LAB — LACTATE DEHYDROGENASE: LDH: 162 U/L (ref 98–192)

## 2019-10-06 MED ORDER — INSULIN REGULAR HUMAN 100 UNIT/ML IJ SOLN
10.0000 [IU] | Freq: Once | INTRAMUSCULAR | Status: AC
  Administered 2019-10-06: 11:00:00 10 [IU] via SUBCUTANEOUS

## 2019-10-06 MED ORDER — ACETAMINOPHEN 325 MG PO TABS
650.0000 mg | ORAL_TABLET | Freq: Once | ORAL | Status: AC
  Administered 2019-10-06: 650 mg via ORAL

## 2019-10-06 MED ORDER — INSULIN REGULAR HUMAN 100 UNIT/ML IJ SOLN
INTRAMUSCULAR | Status: AC
  Filled 2019-10-06: qty 1

## 2019-10-06 MED ORDER — DEXAMETHASONE SODIUM PHOSPHATE 10 MG/ML IJ SOLN
INTRAMUSCULAR | Status: AC
  Filled 2019-10-06: qty 1

## 2019-10-06 MED ORDER — SODIUM CHLORIDE 0.9 % IV SOLN
1000.0000 mg | Freq: Once | INTRAVENOUS | Status: AC
  Administered 2019-10-06: 11:00:00 1000 mg via INTRAVENOUS
  Filled 2019-10-06: qty 40

## 2019-10-06 MED ORDER — SODIUM CHLORIDE 0.9 % IV SOLN
Freq: Once | INTRAVENOUS | Status: AC
  Filled 2019-10-06: qty 250

## 2019-10-06 MED ORDER — ACETAMINOPHEN 325 MG PO TABS
ORAL_TABLET | ORAL | Status: AC
  Filled 2019-10-06: qty 2

## 2019-10-06 MED ORDER — DIPHENHYDRAMINE HCL 50 MG/ML IJ SOLN
25.0000 mg | Freq: Once | INTRAMUSCULAR | Status: AC
  Administered 2019-10-06: 25 mg via INTRAVENOUS

## 2019-10-06 MED ORDER — DIPHENHYDRAMINE HCL 50 MG/ML IJ SOLN
INTRAMUSCULAR | Status: AC
  Filled 2019-10-06: qty 1

## 2019-10-06 MED ORDER — DEXAMETHASONE SODIUM PHOSPHATE 10 MG/ML IJ SOLN
10.0000 mg | Freq: Once | INTRAMUSCULAR | Status: AC
  Administered 2019-10-06: 10:00:00 10 mg via INTRAVENOUS

## 2019-10-06 NOTE — Progress Notes (Signed)
Per Dr. Julien Nordmann, ok to treat with elevated HR 116.

## 2019-10-06 NOTE — Patient Instructions (Signed)
[facility] Discharge Instructions for Patients Receiving Chemotherapy  Today you received the following chemotherapy agents: obinutuzumab.  To help prevent nausea and vomiting after your treatment, we encourage you to take your nausea medication as directed.   If you develop nausea and vomiting that is not controlled by your nausea medication, call the clinic.   BELOW ARE SYMPTOMS THAT SHOULD BE REPORTED IMMEDIATELY:  *FEVER GREATER THAN 100.5 F  *CHILLS WITH OR WITHOUT FEVER  NAUSEA AND VOMITING THAT IS NOT CONTROLLED WITH YOUR NAUSEA MEDICATION  *UNUSUAL SHORTNESS OF BREATH  *UNUSUAL BRUISING OR BLEEDING  TENDERNESS IN MOUTH AND THROAT WITH OR WITHOUT PRESENCE OF ULCERS  *URINARY PROBLEMS  *BOWEL PROBLEMS  UNUSUAL RASH Items with * indicate a potential emergency and should be followed up as soon as possible.  Feel free to call the clinic should you have any questions or concerns. The clinic phone number is [phone].  Please show the CHEMO ALERT CARD at check-in to the Emergency Department and triage nurse.   

## 2019-10-06 NOTE — Progress Notes (Signed)
[place] [phone]   [phone]  OFFICE PROGRESS NOTE  [name], MD [place] [address]  DIAGNOSIS: CLL/small lymphocytic lymphoma diagnosed in 2010 and has been in observation for several years with no treatment. 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 The cytogenetics were negative for P 17.   PRIOR THERAPY: None  CURRENT THERAPY: Obinutuzumab 1,000 mg IV. First dose on 08/10/2019. He will also begin acalabrutinib 100 mg BID starting on 08/19/2019.  Status post 2 cycles.  INTERVAL HISTORY: Larry Lam 49 y.o. male returns to the clinic today for follow-up visit.  The patient is feeling fine today with no concerning complaints.  He denied having any chest pain, shortness of breath, cough or hemoptysis.  The patient has no weight loss or night sweats.  He has no nausea, vomiting, diarrhea or constipation.  He has no headache or visual changes.  He tolerated the second cycle of his treatment with obinutuzumab and acalabrutinib fairly well.  The patient is here today for evaluation before starting cycle #3 of his treatment.  MEDICAL HISTORY: Past Medical History:  Diagnosis Date  . Asthma   . COPD (chronic obstructive pulmonary disease) ([place])   . Diabetes mellitus without complication ([place])   . Hypertension   . Seizures ([place])    3-4 years ago. Single episode.  . Tobacco use     ALLERGIES:  is allergic to amlodipine besylate-valsartan and hydrochlorothiazide.  MEDICATIONS:  Current Outpatient Medications  Medication Sig Dispense Refill  . acalabrutinib (CALQUENCE) 100 MG capsule Take 1 capsule (100 mg total) by mouth 2 (two) times daily. 60 capsule 2  . albuterol (PROVENTIL HFA;VENTOLIN HFA) 108 (90 Base) MCG/ACT inhaler Inhale 2 puffs into the lungs every 6 (six) hours as needed for wheezing or shortness of breath. 1 Inhaler 0  . allopurinol  (ZYLOPRIM) 100 MG tablet Take 1 tablet (100 mg total) by mouth 2 (two) times daily. 60 tablet 0  . ASPIRIN 81 PO Take 81 mg by mouth.    Marland Kitchen atorvastatin (LIPITOR) 40 MG tablet Take 1 tablet by mouth daily.    Marland Kitchen escitalopram (LEXAPRO) 20 MG tablet Take 20 mg by mouth daily.    . Fluticasone-Salmeterol (ADVAIR) 250-50 MCG/DOSE AEPB Inhale into the lungs.    . Insulin Detemir (LEVEMIR) 100 UNIT/ML Pen Inject 30 Units into the skin daily. 18 mL 0  . Insulin Pen Needle (PEN NEEDLES 3/16") 31G X 5 MM MISC Use as directed with insulin pen 100 each 0  . ipratropium-albuterol (DUONEB) 0.5-2.5 (3) MG/3ML SOLN Take 3 mLs by nebulization every 6 (six) hours as needed. J44.9 and J44.1 360 mL 0  . magic mouthwash SOLN Take 5 mLs by mouth 4 (four) times daily as needed for mouth pain. 240 mL 0  . magic mouthwash SOLN Take 5 mLs by mouth daily as needed.    . nystatin (MYCOSTATIN) 100000 UNIT/ML suspension Take 100,000 Units by mouth daily as needed.    . pantoprazole (PROTONIX) 40 MG tablet Take 1 tablet (40 mg total) by mouth daily. 30 tablet 11  . predniSONE (DELTASONE) 50 MG tablet Please take 4 tablets (80 mg) by mouth daily for 7 days starting on 08/20/2019, then take 3 tablets (60 mg) by mouth daily for 7 days, followed by 2 tablets (40 mg) by mouth daily for 7 days, followed by 1 tablet (20 mg) daily for 7 days  70 tablet 0  . umeclidinium bromide (INCRUSE ELLIPTA) 62.5 MCG/INH AEPB Inhale into the lungs.    . vitamin B-12 (CYANOCOBALAMIN) 1000 MCG tablet Take 1 tablet (1,000 mcg total) by mouth daily. 90 tablet 0   No current facility-administered medications for this visit.    SURGICAL HISTORY: No past surgical history on file.  REVIEW OF SYSTEMS:  A comprehensive review of systems was negative.   PHYSICAL EXAMINATION: General appearance: alert, cooperative and no distress Head: Normocephalic, without obvious abnormality, atraumatic Neck: no adenopathy, no JVD, supple, symmetrical, trachea midline  and thyroid not enlarged, symmetric, no tenderness/mass/nodules Lymph nodes: Cervical, supraclavicular, and axillary nodes normal. Resp: clear to auscultation bilaterally Back: symmetric, no curvature. ROM normal. No CVA tenderness. Cardio: regular rate and rhythm, S1, S2 normal, no murmur, click, rub or gallop GI: soft, non-tender; bowel sounds normal; no masses,  no organomegaly Extremities: extremities normal, atraumatic, no cyanosis or edema  ECOG PERFORMANCE STATUS: 1 - Symptomatic but completely ambulatory  Blood pressure (!) 143/75, pulse (!) 124, temperature 99.1 F (37.3 C), temperature source Temporal, resp. rate 17, height 5\' 10"  (1.778 m), weight 227 lb 11.2 oz (103.3 kg), SpO2 97 %.  LABORATORY DATA: Lab Results  Component Value Date   WBC 6.6 10/06/2019   HGB 12.2 (L) 10/06/2019   HCT 37.3 (L) 10/06/2019   MCV 91.2 10/06/2019   PLT 312 10/06/2019      Chemistry      Component Value Date/Time   NA 134 (L) 09/08/2019 0802   NA 137 07/06/2012 0506   K 4.3 09/08/2019 0802   K 4.0 07/06/2012 0506   CL 96 (L) 09/08/2019 0802   CL 104 07/06/2012 0506   CO2 27 09/08/2019 0802   CO2 25 07/06/2012 0506   BUN 27 (H) 09/08/2019 0802   BUN 12 07/06/2012 0506   CREATININE 0.89 09/08/2019 0802   CREATININE 0.62 07/06/2012 0506      Component Value Date/Time   CALCIUM 9.5 09/08/2019 0802   CALCIUM 8.6 07/06/2012 0506   ALKPHOS 87 09/08/2019 0802   ALKPHOS 96 07/06/2012 0506   AST 13 (L) 09/08/2019 0802   ALT 35 09/08/2019 0802   ALT 30 07/06/2012 0506   BILITOT 0.4 09/08/2019 0802       RADIOGRAPHIC STUDIES: No results found.  ASSESSMENT AND PLAN: This is a very pleasant 49 years old white male with CLL/small lymphocytic lymphoma diagnosed in 2010 and has been in observation for several years but the patient had significant disease progression and August 2020. He is currently undergoing treatment with obinutuzumab every 4 weeks in addition to acalabrutinib 100 mg  p.o. twice daily.  Status post 2 cycles. He has been tolerating this treatment well with no concerning complaints. I recommended for him to proceed with cycle #3 today as planned. I will see him back for follow-up visit in 4 weeks for evaluation after repeating CT scan of the chest, abdomen pelvis for restaging of his disease. The patient was advised to call immediately if he has any concerning symptoms in the interval. The patient voices understanding of current disease status and treatment options and is in agreement with the current care plan.  All questions were answered. The patient knows to call the clinic with any problems, questions or concerns. We can certainly see the patient much sooner if necessary.   Disclaimer: This note was dictated with voice recognition software. Similar sounding words can inadvertently be transcribed and may not be corrected upon review.

## 2019-10-07 ENCOUNTER — Telehealth: Payer: Self-pay | Admitting: Internal Medicine

## 2019-10-07 NOTE — Telephone Encounter (Signed)
Scheduled per los. Called and left msg. Mailed printout  °

## 2019-11-01 ENCOUNTER — Ambulatory Visit (HOSPITAL_COMMUNITY)
Admission: RE | Admit: 2019-11-01 | Discharge: 2019-11-01 | Disposition: A | Discharge status: Home / Self Care | Payer: Medicare Other | Source: Ambulatory Visit | Attending: Internal Medicine | Admitting: Internal Medicine

## 2019-11-01 ENCOUNTER — Other Ambulatory Visit: Payer: Self-pay

## 2019-11-01 DIAGNOSIS — C9110 Chronic lymphocytic leukemia of B-cell type not having achieved remission: Secondary | ICD-10-CM

## 2019-11-01 MED ORDER — IOHEXOL 300 MG/ML  SOLN
100.0000 mL | Freq: Once | INTRAMUSCULAR | Status: AC | PRN
  Administered 2019-11-01: 100 mL via INTRAVENOUS

## 2019-11-01 MED ORDER — SODIUM CHLORIDE (PF) 0.9 % IJ SOLN
INTRAMUSCULAR | Status: AC
  Filled 2019-11-01: qty 50

## 2019-11-03 ENCOUNTER — Other Ambulatory Visit: Payer: Self-pay

## 2019-11-03 ENCOUNTER — Encounter: Payer: Self-pay | Admitting: Internal Medicine

## 2019-11-03 ENCOUNTER — Inpatient Hospital Stay: Payer: Medicare Other | Attending: Physician Assistant | Admitting: Internal Medicine

## 2019-11-03 ENCOUNTER — Inpatient Hospital Stay: Payer: Medicare Other

## 2019-11-03 ENCOUNTER — Other Ambulatory Visit: Payer: Self-pay | Admitting: Physician Assistant

## 2019-11-03 VITALS — BP 154/70 | HR 110 | Temp 100.1°F | Resp 20 | Ht 70.0 in | Wt 236.0 lb

## 2019-11-03 VITALS — BP 141/72 | HR 93 | Temp 98.3°F | Resp 17

## 2019-11-03 DIAGNOSIS — I10 Essential (primary) hypertension: Secondary | ICD-10-CM | POA: Diagnosis not present

## 2019-11-03 DIAGNOSIS — Z7952 Long term (current) use of systemic steroids: Secondary | ICD-10-CM | POA: Insufficient documentation

## 2019-11-03 DIAGNOSIS — Z79899 Other long term (current) drug therapy: Secondary | ICD-10-CM | POA: Diagnosis not present

## 2019-11-03 DIAGNOSIS — Z794 Long term (current) use of insulin: Secondary | ICD-10-CM | POA: Diagnosis not present

## 2019-11-03 DIAGNOSIS — Z7951 Long term (current) use of inhaled steroids: Secondary | ICD-10-CM | POA: Insufficient documentation

## 2019-11-03 DIAGNOSIS — F17210 Nicotine dependence, cigarettes, uncomplicated: Secondary | ICD-10-CM | POA: Insufficient documentation

## 2019-11-03 DIAGNOSIS — R739 Hyperglycemia, unspecified: Secondary | ICD-10-CM

## 2019-11-03 DIAGNOSIS — Z7982 Long term (current) use of aspirin: Secondary | ICD-10-CM | POA: Insufficient documentation

## 2019-11-03 DIAGNOSIS — C9110 Chronic lymphocytic leukemia of B-cell type not having achieved remission: Secondary | ICD-10-CM

## 2019-11-03 DIAGNOSIS — J449 Chronic obstructive pulmonary disease, unspecified: Secondary | ICD-10-CM | POA: Diagnosis not present

## 2019-11-03 DIAGNOSIS — Z5112 Encounter for antineoplastic immunotherapy: Secondary | ICD-10-CM | POA: Diagnosis present

## 2019-11-03 DIAGNOSIS — D519 Vitamin B12 deficiency anemia, unspecified: Secondary | ICD-10-CM

## 2019-11-03 DIAGNOSIS — E119 Type 2 diabetes mellitus without complications: Secondary | ICD-10-CM | POA: Insufficient documentation

## 2019-11-03 DIAGNOSIS — R591 Generalized enlarged lymph nodes: Secondary | ICD-10-CM | POA: Diagnosis not present

## 2019-11-03 DIAGNOSIS — E1165 Type 2 diabetes mellitus with hyperglycemia: Secondary | ICD-10-CM

## 2019-11-03 LAB — CBC WITH DIFFERENTIAL (CANCER CENTER ONLY)
Abs Immature Granulocytes: 0.29 10*3/uL — ABNORMAL HIGH (ref 0.00–0.07)
Basophils Absolute: 0.1 10*3/uL (ref 0.0–0.1)
Basophils Relative: 1 %
Eosinophils Absolute: 0.1 10*3/uL (ref 0.0–0.5)
Eosinophils Relative: 1 %
HCT: 41.7 % (ref 39.0–52.0)
Hemoglobin: 13.6 g/dL (ref 13.0–17.0)
Immature Granulocytes: 4 %
Lymphocytes Relative: 22 %
Lymphs Abs: 1.8 10*3/uL (ref 0.7–4.0)
MCH: 28.8 pg (ref 26.0–34.0)
MCHC: 32.6 g/dL (ref 30.0–36.0)
MCV: 88.2 fL (ref 80.0–100.0)
Monocytes Absolute: 0.8 10*3/uL (ref 0.1–1.0)
Monocytes Relative: 9 %
Neutro Abs: 5 10*3/uL (ref 1.7–7.7)
Neutrophils Relative %: 63 %
Platelet Count: 177 10*3/uL (ref 150–400)
RBC: 4.73 MIL/uL (ref 4.22–5.81)
RDW: 14.1 % (ref 11.5–15.5)
WBC Count: 8 10*3/uL (ref 4.0–10.5)
nRBC: 0 % (ref 0.0–0.2)

## 2019-11-03 LAB — CMP (CANCER CENTER ONLY)
ALT: 13 U/L (ref 0–44)
AST: 12 U/L — ABNORMAL LOW (ref 15–41)
Albumin: 3.9 g/dL (ref 3.5–5.0)
Alkaline Phosphatase: 116 U/L (ref 38–126)
Anion gap: 10 (ref 5–15)
BUN: 20 mg/dL (ref 6–20)
CO2: 25 mmol/L (ref 22–32)
Calcium: 9.8 mg/dL (ref 8.9–10.3)
Chloride: 99 mmol/L (ref 98–111)
Creatinine: 1.18 mg/dL (ref 0.61–1.24)
GFR, Est AFR Am: >60 mL/min (ref >60)
GFR, Estimated: >60 mL/min (ref >60)
Glucose, Bld: 441 mg/dL — ABNORMAL HIGH (ref 70–99)
Potassium: 4.7 mmol/L (ref 3.5–5.1)
Sodium: 134 mmol/L — ABNORMAL LOW (ref 135–145)
Total Bilirubin: 0.3 mg/dL (ref 0.3–1.2)
Total Protein: 6.9 g/dL (ref 6.5–8.1)

## 2019-11-03 LAB — GLUCOSE, CAPILLARY: Glucose-Capillary: 370 mg/dL — ABNORMAL HIGH (ref 70–99)

## 2019-11-03 LAB — LACTATE DEHYDROGENASE: LDH: 207 U/L — ABNORMAL HIGH (ref 98–192)

## 2019-11-03 LAB — URIC ACID: Uric Acid, Serum: 6.1 mg/dL (ref 3.7–8.6)

## 2019-11-03 MED ORDER — DIPHENHYDRAMINE HCL 50 MG/ML IJ SOLN
INTRAMUSCULAR | Status: AC
  Filled 2019-11-03: qty 1

## 2019-11-03 MED ORDER — ACETAMINOPHEN 325 MG PO TABS
650.0000 mg | ORAL_TABLET | Freq: Once | ORAL | Status: AC
  Administered 2019-11-03: 650 mg via ORAL

## 2019-11-03 MED ORDER — INSULIN REGULAR HUMAN 100 UNIT/ML IJ SOLN
INTRAMUSCULAR | Status: AC
  Filled 2019-11-03: qty 1

## 2019-11-03 MED ORDER — SODIUM CHLORIDE 0.9 % IV SOLN
Freq: Once | INTRAVENOUS | Status: AC
  Filled 2019-11-03: qty 250

## 2019-11-03 MED ORDER — DEXAMETHASONE SODIUM PHOSPHATE 10 MG/ML IJ SOLN
INTRAMUSCULAR | Status: AC
  Filled 2019-11-03: qty 1

## 2019-11-03 MED ORDER — SODIUM CHLORIDE 0.9 % IV SOLN
1000.0000 mg | Freq: Once | INTRAVENOUS | Status: AC
  Administered 2019-11-03: 1000 mg via INTRAVENOUS
  Filled 2019-11-03: qty 40

## 2019-11-03 MED ORDER — ACETAMINOPHEN 325 MG PO TABS
ORAL_TABLET | ORAL | Status: AC
  Filled 2019-11-03: qty 2

## 2019-11-03 MED ORDER — INSULIN REGULAR HUMAN 100 UNIT/ML IJ SOLN
10.0000 [IU] | Freq: Once | INTRAMUSCULAR | Status: AC
  Administered 2019-11-03: 10 [IU] via SUBCUTANEOUS

## 2019-11-03 MED ORDER — DIPHENHYDRAMINE HCL 50 MG/ML IJ SOLN
25.0000 mg | Freq: Once | INTRAMUSCULAR | Status: AC
  Administered 2019-11-03: 25 mg via INTRAVENOUS

## 2019-11-03 MED ORDER — DEXAMETHASONE SODIUM PHOSPHATE 10 MG/ML IJ SOLN
10.0000 mg | Freq: Once | INTRAMUSCULAR | Status: AC
  Administered 2019-11-03: 10 mg via INTRAVENOUS

## 2019-11-03 NOTE — Progress Notes (Signed)
Per Dr. Julien Nordmann, okay to treat with HR 103.  CBG=360, 1 hour post insulin.

## 2019-11-03 NOTE — Progress Notes (Signed)
[place] [phone]   [phone]  OFFICE PROGRESS NOTE  Larry Coffin, MD [place] [id number]  DIAGNOSIS: CLL/small lymphocytic lymphoma diagnosed in 2010 and has been in observation for several years with no treatment. 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 The cytogenetics were negative for P 17.   PRIOR THERAPY: None  CURRENT THERAPY: Obinutuzumab 1,000 mg IV. First dose on 08/10/2019. He will also begin acalabrutinib 100 mg BID starting on 08/19/2019.  Status post 3 cycles.  INTERVAL HISTORY: Larry Lam 49 y.o. male returns to the clinic today for follow-up visit.  The patient is feeling fine today with no concerning complaints.  His mother passed away few weeks ago.  The patient returned back to smoking few cigarettes every day.  He denied having any current chest pain, shortness of breath except with exertion with no cough or hemoptysis.  He denied having any recent weight loss or night sweats.  He has no nausea, vomiting, diarrhea or constipation.  He denied having any headache or visual changes.  The patient continues to tolerate his treatment with obinutuzumab and acalabrutinib fairly well.  He had repeat CT scan of the chest, abdomen pelvis performed recently and he is here for evaluation and discussion of his scan results.  MEDICAL HISTORY: Past Medical History:  Diagnosis Date  . Asthma   . COPD (chronic obstructive pulmonary disease) ([place])   . Diabetes mellitus without complication ([place])   . Hypertension   . Seizures ([place])    3-4 years ago. Single episode.  . Tobacco use     ALLERGIES:  is allergic to amlodipine besylate-valsartan and hydrochlorothiazide.  MEDICATIONS:  Current Outpatient Medications  Medication Sig Dispense Refill  . acalabrutinib (CALQUENCE) 100 MG capsule Take 1 capsule (100 mg total) by mouth 2 (two) times daily. 60  capsule 2  . albuterol (PROVENTIL HFA;VENTOLIN HFA) 108 (90 Base) MCG/ACT inhaler Inhale 2 puffs into the lungs every 6 (six) hours as needed for wheezing or shortness of breath. 1 Inhaler 0  . allopurinol (ZYLOPRIM) 100 MG tablet Take 1 tablet (100 mg total) by mouth 2 (two) times daily. 60 tablet 0  . ASPIRIN 81 PO Take 81 mg by mouth.    Marland Kitchen atorvastatin (LIPITOR) 40 MG tablet Take 1 tablet by mouth daily.    Marland Kitchen escitalopram (LEXAPRO) 20 MG tablet Take 20 mg by mouth daily.    . Fluticasone-Salmeterol (ADVAIR) 250-50 MCG/DOSE AEPB Inhale into the lungs.    . Insulin Detemir (LEVEMIR) 100 UNIT/ML Pen Inject 30 Units into the skin daily. 18 mL 0  . Insulin Pen Needle (PEN NEEDLES 3/16") 31G X 5 MM MISC Use as directed with insulin pen 100 each 0  . ipratropium-albuterol (DUONEB) 0.5-2.5 (3) MG/3ML SOLN Take 3 mLs by nebulization every 6 (six) hours as needed. J44.9 and J44.1 360 mL 0  . magic mouthwash SOLN Take 5 mLs by mouth 4 (four) times daily as needed for mouth pain. 240 mL 0  . magic mouthwash SOLN Take 5 mLs by mouth daily as needed.    . nystatin (MYCOSTATIN) 100000 UNIT/ML suspension Take 100,000 Units by mouth daily as needed.    . pantoprazole (PROTONIX) 40 MG tablet Take 1 tablet (40 mg total) by mouth daily. 30 tablet 11  . predniSONE (DELTASONE) 50 MG tablet Please take 4 tablets (80 mg) by mouth daily for 7 days starting  on 08/20/2019, then take 3 tablets (60 mg) by mouth daily for 7 days, followed by 2 tablets (40 mg) by mouth daily for 7 days, followed by 1 tablet (20 mg) daily for 7 days 70 tablet 0  . umeclidinium bromide (INCRUSE ELLIPTA) 62.5 MCG/INH AEPB Inhale into the lungs.    . vitamin B-12 (CYANOCOBALAMIN) 1000 MCG tablet Take 1 tablet (1,000 mcg total) by mouth daily. 90 tablet 0   No current facility-administered medications for this visit.    SURGICAL HISTORY: No past surgical history on file.  REVIEW OF SYSTEMS:  Constitutional: negative Eyes: negative Ears,  nose, mouth, throat, and face: negative Respiratory: negative Cardiovascular: negative Gastrointestinal: negative Genitourinary:negative Integument/breast: negative Hematologic/lymphatic: negative Musculoskeletal:negative Neurological: negative Behavioral/Psych: negative Endocrine: negative Allergic/Immunologic: negative   PHYSICAL EXAMINATION: General appearance: alert, cooperative and no distress Head: Normocephalic, without obvious abnormality, atraumatic Neck: no adenopathy, no JVD, supple, symmetrical, trachea midline and thyroid not enlarged, symmetric, no tenderness/mass/nodules Lymph nodes: Cervical, supraclavicular, and axillary nodes normal. Resp: clear to auscultation bilaterally Back: symmetric, no curvature. ROM normal. No CVA tenderness. Cardio: regular rate and rhythm, S1, S2 normal, no murmur, click, rub or gallop GI: soft, non-tender; bowel sounds normal; no masses,  no organomegaly Extremities: extremities normal, atraumatic, no cyanosis or edema Neurologic: Alert and oriented X 3, normal strength and tone. Normal symmetric reflexes. Normal coordination and gait  ECOG PERFORMANCE STATUS: 1 - Symptomatic but completely ambulatory  Blood pressure (!) 154/70, pulse (!) 110, temperature 100.1 F (37.8 C), temperature source Oral, resp. rate 20, height 5\' 10"  (1.778 m), weight 236 lb (107 kg), SpO2 97 %.  LABORATORY DATA: Lab Results  Component Value Date   WBC 8.0 11/03/2019   HGB 13.6 11/03/2019   HCT 41.7 11/03/2019   MCV 88.2 11/03/2019   PLT 177 11/03/2019      Chemistry      Component Value Date/Time   NA 136 10/06/2019 0740   NA 137 07/06/2012 0506   K 4.6 10/06/2019 0740   K 4.0 07/06/2012 0506   CL 99 10/06/2019 0740   CL 104 07/06/2012 0506   CO2 24 10/06/2019 0740   CO2 25 07/06/2012 0506   BUN 19 10/06/2019 0740   BUN 12 07/06/2012 0506   CREATININE 0.96 10/06/2019 0740   CREATININE 0.62 07/06/2012 0506      Component Value Date/Time    CALCIUM 9.5 10/06/2019 0740   CALCIUM 8.6 07/06/2012 0506   ALKPHOS 102 10/06/2019 0740   ALKPHOS 96 07/06/2012 0506   AST 9 (L) 10/06/2019 0740   ALT 12 10/06/2019 0740   ALT 30 07/06/2012 0506   BILITOT <0.2 (L) 10/06/2019 0740       RADIOGRAPHIC STUDIES: CT Chest W Contrast  Result Date: 11/01/2019 CLINICAL DATA:  CLL, disease progression in 2020, chemotherapy and oral immunotherapy EXAM: CT CHEST, ABDOMEN, AND PELVIS WITH CONTRAST TECHNIQUE: Multidetector CT imaging of the chest, abdomen and pelvis was performed following the standard protocol during bolus administration of intravenous contrast. CONTRAST:  11mL OMNIPAQUE IOHEXOL 300 MG/ML SOLN, additional oral enteric contrast COMPARISON:  PET-CT, 08/25/2019, CT abdomen pelvis, 08/06/2019, CT chest angiogram, 08/05/2019, CT chest abdomen pelvis, 04/30/2019 FINDINGS: CT CHEST FINDINGS Cardiovascular: Aortic atherosclerosis. Normal heart size. No pericardial effusion. Mediastinum/Nodes: Continued interval decrease in size of numerous bulky bilateral axillary and mediastinal lymph nodes, an index node in the left axilla measuring 2.2 x 1.5 cm, previously 3.6 x 1.8 cm when measured similarly (series 2, image 20). Thyroid gland, trachea, and  esophagus demonstrate no significant findings. Lungs/Pleura: Diffuse bilateral bronchial wall thickening. Redemonstrated fine centrilobular nodularity throughout the upper lobes of the lungs. Near total resolution of previously seen small pulmonary nodules in the lungs bilaterally, for example a 3 mm nodule of the right pulmonary apex, measuring 4 mm on prior examination dated 08/25/2019 and 6 mm on prior examination 08/05/2019. Other nodules are completely resolved. No pleural effusion or pneumothorax. Musculoskeletal: No chest wall mass or suspicious bone lesions identified. CT ABDOMEN PELVIS FINDINGS Hepatobiliary: No solid liver abnormality is seen. No gallstones, gallbladder wall thickening, or biliary  dilatation. Pancreas: Unremarkable. No pancreatic ductal dilatation or surrounding inflammatory changes. Spleen: Normal in size without significant abnormality. Adrenals/Urinary Tract: Adrenal glands are unremarkable. Kidneys are normal, without renal calculi, solid lesion, or hydronephrosis. Bladder is unremarkable. Stomach/Bowel: Stomach is within normal limits. Appendix appears normal. No evidence of bowel wall thickening, distention, or inflammatory changes. Vascular/Lymphatic: Aortic atherosclerosis. Essentially complete resolution of previously seen mesenteric, portal, and retroperitoneal lymphadenopathy, with residual subcentimeter lymph nodes (series 2, image 67). Significant interval reduction in size of bulky bilateral iliac and inguinal lymph nodes, an index right iliac lymph node measured 3.6 x 1.1 cm, previously 5.6 x 2.6 cm when measured similarly (series 2, image 107). Reproductive: No mass or other abnormality. Other: No abdominal wall hernia or abnormality. No abdominopelvic ascites. Musculoskeletal: No acute or significant osseous findings. IMPRESSION: 1. Continued, significant interval decrease in size numerous bulky lymph nodes in the chest, abdomen, and pelvis, consistent with continued treatment response. 2. Near-total resolution of previously seen small pulmonary nodules in the lungs bilaterally, for example a 3 mm nodule of the right pulmonary apex, measuring 4 mm on prior examination dated 08/25/2019 and 7 mm on prior examination 08/05/2019. Other nodules are completely resolved. Although an unusual manifestation, the time course and apparent treatment response of these nodules suggests pulmonary involvement of lymphoma rather than acute infection or inflammation. Attention on follow-up. 3. Redemonstrated diffuse bilateral bronchial wall thickening, consistent with nonspecific infectious or inflammatory bronchitis. 4. Redemonstrated fine centrilobular nodularity throughout the upper lobes of  the lungs, likely smoking-related respiratory bronchiolitis. 5. Aortic Atherosclerosis (ICD10-I70.0). Electronically Signed   By: Eddie Candle M.D.   On: 11/01/2019 11:12   CT Abdomen Pelvis W Contrast  Result Date: 11/01/2019 CLINICAL DATA:  CLL, disease progression in 2020, chemotherapy and oral immunotherapy EXAM: CT CHEST, ABDOMEN, AND PELVIS WITH CONTRAST TECHNIQUE: Multidetector CT imaging of the chest, abdomen and pelvis was performed following the standard protocol during bolus administration of intravenous contrast. CONTRAST:  127mL OMNIPAQUE IOHEXOL 300 MG/ML SOLN, additional oral enteric contrast COMPARISON:  PET-CT, 08/25/2019, CT abdomen pelvis, 08/06/2019, CT chest angiogram, 08/05/2019, CT chest abdomen pelvis, 04/30/2019 FINDINGS: CT CHEST FINDINGS Cardiovascular: Aortic atherosclerosis. Normal heart size. No pericardial effusion. Mediastinum/Nodes: Continued interval decrease in size of numerous bulky bilateral axillary and mediastinal lymph nodes, an index node in the left axilla measuring 2.2 x 1.5 cm, previously 3.6 x 1.8 cm when measured similarly (series 2, image 20). Thyroid gland, trachea, and esophagus demonstrate no significant findings. Lungs/Pleura: Diffuse bilateral bronchial wall thickening. Redemonstrated fine centrilobular nodularity throughout the upper lobes of the lungs. Near total resolution of previously seen small pulmonary nodules in the lungs bilaterally, for example a 3 mm nodule of the right pulmonary apex, measuring 4 mm on prior examination dated 08/25/2019 and 6 mm on prior examination 08/05/2019. Other nodules are completely resolved. No pleural effusion or pneumothorax. Musculoskeletal: No chest wall mass or suspicious bone lesions  identified. CT ABDOMEN PELVIS FINDINGS Hepatobiliary: No solid liver abnormality is seen. No gallstones, gallbladder wall thickening, or biliary dilatation. Pancreas: Unremarkable. No pancreatic ductal dilatation or surrounding  inflammatory changes. Spleen: Normal in size without significant abnormality. Adrenals/Urinary Tract: Adrenal glands are unremarkable. Kidneys are normal, without renal calculi, solid lesion, or hydronephrosis. Bladder is unremarkable. Stomach/Bowel: Stomach is within normal limits. Appendix appears normal. No evidence of bowel wall thickening, distention, or inflammatory changes. Vascular/Lymphatic: Aortic atherosclerosis. Essentially complete resolution of previously seen mesenteric, portal, and retroperitoneal lymphadenopathy, with residual subcentimeter lymph nodes (series 2, image 67). Significant interval reduction in size of bulky bilateral iliac and inguinal lymph nodes, an index right iliac lymph node measured 3.6 x 1.1 cm, previously 5.6 x 2.6 cm when measured similarly (series 2, image 107). Reproductive: No mass or other abnormality. Other: No abdominal wall hernia or abnormality. No abdominopelvic ascites. Musculoskeletal: No acute or significant osseous findings. IMPRESSION: 1. Continued, significant interval decrease in size numerous bulky lymph nodes in the chest, abdomen, and pelvis, consistent with continued treatment response. 2. Near-total resolution of previously seen small pulmonary nodules in the lungs bilaterally, for example a 3 mm nodule of the right pulmonary apex, measuring 4 mm on prior examination dated 08/25/2019 and 7 mm on prior examination 08/05/2019. Other nodules are completely resolved. Although an unusual manifestation, the time course and apparent treatment response of these nodules suggests pulmonary involvement of lymphoma rather than acute infection or inflammation. Attention on follow-up. 3. Redemonstrated diffuse bilateral bronchial wall thickening, consistent with nonspecific infectious or inflammatory bronchitis. 4. Redemonstrated fine centrilobular nodularity throughout the upper lobes of the lungs, likely smoking-related respiratory bronchiolitis. 5. Aortic  Atherosclerosis (ICD10-I70.0). Electronically Signed   By: Eddie Candle M.D.   On: 11/01/2019 11:12    ASSESSMENT AND PLAN: This is a very pleasant 49 years old white male with CLL/small lymphocytic lymphoma diagnosed in 2010 and has been in observation for several years but the patient had significant disease progression and August 2020. He is currently undergoing treatment with obinutuzumab every 4 weeks in addition to acalabrutinib 100 mg p.o. twice daily.  Status post 3 cycles. He has been tolerating this treatment well with no concerning complaints. He had repeat CT scan of the chest, abdomen pelvis performed recently.  I personally and independently reviewed the scans and discussed the results with the patient today. His scan showed significant interval decrease in the size of the numerous bulky lymph nodes in the chest, abdomen and pelvis with near total resolution of small pulmonary nodules bilaterally. I recommended for the patient to continue his current treatment and he will proceed with cycle #4 of obinutuzumab today. He will come back for follow-up visit in 4 weeks for evaluation with the next cycle of his treatment. For smoking cessation, I strongly encouraged the patient to quit smoking. For hypertension he was advised to take his blood pressure medications and to monitor it closely at home. The patient was advised to call immediately if he has any other concerning symptoms in the interval. The patient voices understanding of current disease status and treatment options and is in agreement with the current care plan.  All questions were answered. The patient knows to call the clinic with any problems, questions or concerns. We can certainly see the patient much sooner if necessary.   Disclaimer: This note was dictated with voice recognition software. Similar sounding words can inadvertently be transcribed and may not be corrected upon review.

## 2019-11-03 NOTE — Patient Instructions (Signed)
Cancer Center Discharge Instructions for Patients Receiving Chemotherapy  Today you received the following chemotherapy agents: Gazyva   To help prevent nausea and vomiting after your treatment, we encourage you to take your nausea medication as directed.    If you develop nausea and vomiting that is not controlled by your nausea medication, call the clinic.   BELOW ARE SYMPTOMS THAT SHOULD BE REPORTED IMMEDIATELY:  *FEVER GREATER THAN 100.5 F  *CHILLS WITH OR WITHOUT FEVER  NAUSEA AND VOMITING THAT IS NOT CONTROLLED WITH YOUR NAUSEA MEDICATION  *UNUSUAL SHORTNESS OF BREATH  *UNUSUAL BRUISING OR BLEEDING  TENDERNESS IN MOUTH AND THROAT WITH OR WITHOUT PRESENCE OF ULCERS  *URINARY PROBLEMS  *BOWEL PROBLEMS  UNUSUAL RASH Items with * indicate a potential emergency and should be followed up as soon as possible.  Feel free to call the clinic should you have any questions or concerns. The clinic phone number is [phone].  Please show the CHEMO ALERT CARD at check-in to the Emergency Department and triage nurse.   

## 2019-11-04 ENCOUNTER — Telehealth: Payer: Self-pay | Admitting: Internal Medicine

## 2019-11-04 NOTE — Telephone Encounter (Signed)
Scheduled per los. Called and left msg. Mailed printout  °

## 2019-11-09 ENCOUNTER — Other Ambulatory Visit: Payer: Self-pay | Admitting: Internal Medicine

## 2019-11-09 DIAGNOSIS — C9110 Chronic lymphocytic leukemia of B-cell type not having achieved remission: Secondary | ICD-10-CM

## 2019-11-25 NOTE — Progress Notes (Signed)

## 2019-12-01 ENCOUNTER — Other Ambulatory Visit: Payer: Self-pay

## 2019-12-01 ENCOUNTER — Encounter: Payer: Self-pay | Admitting: Internal Medicine

## 2019-12-01 ENCOUNTER — Other Ambulatory Visit: Payer: Self-pay | Admitting: Internal Medicine

## 2019-12-01 ENCOUNTER — Other Ambulatory Visit: Payer: Self-pay | Admitting: Medical Oncology

## 2019-12-01 ENCOUNTER — Inpatient Hospital Stay: Payer: Medicare Other

## 2019-12-01 ENCOUNTER — Inpatient Hospital Stay: Payer: Medicare Other | Attending: Physician Assistant | Admitting: Internal Medicine

## 2019-12-01 VITALS — BP 159/70 | HR 110 | Temp 99.1°F | Resp 18 | Ht 70.0 in | Wt 241.3 lb

## 2019-12-01 VITALS — BP 126/65 | HR 100 | Temp 97.9°F | Resp 18

## 2019-12-01 DIAGNOSIS — Z7982 Long term (current) use of aspirin: Secondary | ICD-10-CM | POA: Diagnosis not present

## 2019-12-01 DIAGNOSIS — E119 Type 2 diabetes mellitus without complications: Secondary | ICD-10-CM | POA: Insufficient documentation

## 2019-12-01 DIAGNOSIS — Z5112 Encounter for antineoplastic immunotherapy: Secondary | ICD-10-CM | POA: Insufficient documentation

## 2019-12-01 DIAGNOSIS — Z79899 Other long term (current) drug therapy: Secondary | ICD-10-CM | POA: Insufficient documentation

## 2019-12-01 DIAGNOSIS — R739 Hyperglycemia, unspecified: Secondary | ICD-10-CM

## 2019-12-01 DIAGNOSIS — C9110 Chronic lymphocytic leukemia of B-cell type not having achieved remission: Secondary | ICD-10-CM | POA: Diagnosis present

## 2019-12-01 DIAGNOSIS — Z794 Long term (current) use of insulin: Secondary | ICD-10-CM | POA: Diagnosis not present

## 2019-12-01 DIAGNOSIS — Z5111 Encounter for antineoplastic chemotherapy: Secondary | ICD-10-CM

## 2019-12-01 DIAGNOSIS — E1165 Type 2 diabetes mellitus with hyperglycemia: Secondary | ICD-10-CM

## 2019-12-01 DIAGNOSIS — Z7951 Long term (current) use of inhaled steroids: Secondary | ICD-10-CM | POA: Insufficient documentation

## 2019-12-01 DIAGNOSIS — I10 Essential (primary) hypertension: Secondary | ICD-10-CM | POA: Insufficient documentation

## 2019-12-01 DIAGNOSIS — J449 Chronic obstructive pulmonary disease, unspecified: Secondary | ICD-10-CM | POA: Insufficient documentation

## 2019-12-01 LAB — CBC WITH DIFFERENTIAL (CANCER CENTER ONLY)
Abs Immature Granulocytes: 0.09 10*3/uL — ABNORMAL HIGH (ref 0.00–0.07)
Basophils Absolute: 0.1 10*3/uL (ref 0.0–0.1)
Basophils Relative: 1 %
Eosinophils Absolute: 0.1 10*3/uL (ref 0.0–0.5)
Eosinophils Relative: 1 %
HCT: 44.8 % (ref 39.0–52.0)
Hemoglobin: 14.5 g/dL (ref 13.0–17.0)
Immature Granulocytes: 1 %
Lymphocytes Relative: 26 %
Lymphs Abs: 1.9 10*3/uL (ref 0.7–4.0)
MCH: 27.9 pg (ref 26.0–34.0)
MCHC: 32.4 g/dL (ref 30.0–36.0)
MCV: 86.2 fL (ref 80.0–100.0)
Monocytes Absolute: 1.5 10*3/uL — ABNORMAL HIGH (ref 0.1–1.0)
Monocytes Relative: 20 %
Neutro Abs: 3.8 10*3/uL (ref 1.7–7.7)
Neutrophils Relative %: 51 %
Platelet Count: 229 10*3/uL (ref 150–400)
RBC: 5.2 MIL/uL (ref 4.22–5.81)
RDW: 13.9 % (ref 11.5–15.5)
WBC Count: 7.4 10*3/uL (ref 4.0–10.5)
nRBC: 0 % (ref 0.0–0.2)

## 2019-12-01 LAB — CMP (CANCER CENTER ONLY)
ALT: 13 U/L (ref 0–44)
AST: 9 U/L — ABNORMAL LOW (ref 15–41)
Albumin: 3.7 g/dL (ref 3.5–5.0)
Alkaline Phosphatase: 100 U/L (ref 38–126)
Anion gap: 13 (ref 5–15)
BUN: 15 mg/dL (ref 6–20)
CO2: 26 mmol/L (ref 22–32)
Calcium: 9.9 mg/dL (ref 8.9–10.3)
Chloride: 98 mmol/L (ref 98–111)
Creatinine: 1.17 mg/dL (ref 0.61–1.24)
GFR, Est AFR Am: >60 mL/min (ref >60)
GFR, Estimated: >60 mL/min (ref >60)
Glucose, Bld: 357 mg/dL — ABNORMAL HIGH (ref 70–99)
Potassium: 4.4 mmol/L (ref 3.5–5.1)
Sodium: 137 mmol/L (ref 135–145)
Total Bilirubin: 0.3 mg/dL (ref 0.3–1.2)
Total Protein: 7.4 g/dL (ref 6.5–8.1)

## 2019-12-01 LAB — LACTATE DEHYDROGENASE: LDH: 164 U/L (ref 98–192)

## 2019-12-01 LAB — URIC ACID: Uric Acid, Serum: 6.5 mg/dL (ref 3.7–8.6)

## 2019-12-01 MED ORDER — DIPHENHYDRAMINE HCL 50 MG/ML IJ SOLN
INTRAMUSCULAR | Status: AC
  Filled 2019-12-01: qty 1

## 2019-12-01 MED ORDER — DEXAMETHASONE SODIUM PHOSPHATE 10 MG/ML IJ SOLN: 10.0000 mg | Freq: Once | INTRAMUSCULAR | Status: DC

## 2019-12-01 MED ORDER — SODIUM CHLORIDE 0.9 % IV SOLN
Freq: Once | INTRAVENOUS | Status: AC
  Filled 2019-12-01: qty 250

## 2019-12-01 MED ORDER — SODIUM CHLORIDE 0.9 % IV SOLN
1000.0000 mg | Freq: Once | INTRAVENOUS | Status: AC
  Administered 2019-12-01: 11:00:00 1000 mg via INTRAVENOUS
  Filled 2019-12-01: qty 40

## 2019-12-01 MED ORDER — SODIUM CHLORIDE 0.9 % IV SOLN
10.0000 mg | Freq: Once | INTRAVENOUS | Status: AC
  Administered 2019-12-01: 10:00:00 10 mg via INTRAVENOUS
  Filled 2019-12-01: qty 10

## 2019-12-01 MED ORDER — INSULIN REGULAR HUMAN 100 UNIT/ML IJ SOLN
8.0000 [IU] | Freq: Once | INTRAMUSCULAR | Status: AC
  Administered 2019-12-01: 11:00:00 8 [IU] via SUBCUTANEOUS

## 2019-12-01 MED ORDER — ACETAMINOPHEN 325 MG PO TABS
650.0000 mg | ORAL_TABLET | Freq: Once | ORAL | Status: AC
  Administered 2019-12-01: 650 mg via ORAL

## 2019-12-01 MED ORDER — ACETAMINOPHEN 325 MG PO TABS
ORAL_TABLET | ORAL | Status: AC
  Filled 2019-12-01: qty 2

## 2019-12-01 MED ORDER — DIPHENHYDRAMINE HCL 50 MG/ML IJ SOLN
25.0000 mg | Freq: Once | INTRAMUSCULAR | Status: AC
  Administered 2019-12-01: 10:00:00 25 mg via INTRAVENOUS

## 2019-12-01 MED ORDER — INSULIN REGULAR HUMAN 100 UNIT/ML IJ SOLN
INTRAMUSCULAR | Status: AC
  Filled 2019-12-01: qty 1

## 2019-12-01 NOTE — Progress Notes (Signed)
[place] [phone]   [phone]  OFFICE PROGRESS NOTE  Larry Coffin, MD [place] [id number]  DIAGNOSIS: CLL/small lymphocytic lymphoma diagnosed in 2010 and has been in observation for several years with no treatment. 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 The cytogenetics were negative for P 17.   PRIOR THERAPY: None  CURRENT THERAPY: Obinutuzumab 1,000 mg IV. First dose on 08/10/2019. He will also begin acalabrutinib 100 mg BID starting on 08/19/2019.  Status post 4 cycles.  INTERVAL HISTORY: [name] Lavallee 49 y.o. male returns to the clinic today for follow-up visit.  The patient is feeling fine today with no concerning complaints except for mild cough and soreness in the substernal area last week.  It is getting better.  He denied having any shortness of breath or hemoptysis.  He denied having any fever or chills.  He has no nausea, vomiting, diarrhea or constipation.  He has no headache or visual changes.  He has been tolerating his treatment with obinutuzumab and acalabrutinib fairly well.  He is here today for evaluation before starting cycle #5.   MEDICAL HISTORY: Past Medical History:  Diagnosis Date  . Asthma   . COPD (chronic obstructive pulmonary disease) ([place])   . Diabetes mellitus without complication ([place])   . Hypertension   . Seizures ([place])    3-4 years ago. Single episode.  . Tobacco use     ALLERGIES:  is allergic to amlodipine besylate-valsartan and hydrochlorothiazide.  MEDICATIONS:  Current Outpatient Medications  Medication Sig Dispense Refill  . albuterol (PROVENTIL HFA;VENTOLIN HFA) 108 (90 Base) MCG/ACT inhaler Inhale 2 puffs into the lungs every 6 (six) hours as needed for wheezing or shortness of breath. 1 Inhaler 0  . allopurinol (ZYLOPRIM) 100 MG tablet Take 1 tablet (100 mg total) by mouth 2 (two) times daily. 60 tablet 0   . ASPIRIN 81 PO Take 81 mg by mouth.    Marland Kitchen atorvastatin (LIPITOR) 10 MG tablet Take 10 mg by mouth daily.    Marland Kitchen CALQUENCE 100 MG capsule TAKE 1 CAPSULE (100 MG TOTAL) BY MOUTH 2 (TWO) TIMES DAILY. 60 capsule 2  . escitalopram (LEXAPRO) 20 MG tablet Take 20 mg by mouth daily.    . Fluticasone-Salmeterol (ADVAIR) 250-50 MCG/DOSE AEPB Inhale into the lungs.    . Insulin Detemir (LEVEMIR) 100 UNIT/ML Pen Inject 30 Units into the skin daily. 18 mL 0  . Insulin Pen Needle (PEN NEEDLES 3/16") 31G X 5 MM MISC Use as directed with insulin pen 100 each 0  . ipratropium-albuterol (DUONEB) 0.5-2.5 (3) MG/3ML SOLN Take 3 mLs by nebulization every 6 (six) hours as needed. J44.9 and J44.1 360 mL 0  . lisinopril (ZESTRIL) 40 MG tablet Take 40 mg by mouth daily.    . metFORMIN (GLUCOPHAGE-XR) 500 MG 24 hr tablet Take 1,000 mg by mouth 2 (two) times daily.    . pantoprazole (PROTONIX) 40 MG tablet Take 1 tablet (40 mg total) by mouth daily. 30 tablet 11  . TRELEGY ELLIPTA 100-62.5-25 MCG/INH AEPB Inhale 1 puff into the lungs daily.    Marland Kitchen umeclidinium bromide (INCRUSE ELLIPTA) 62.5 MCG/INH AEPB Inhale into the lungs.    . vitamin B-12 (CYANOCOBALAMIN) 1000 MCG tablet Take 1 tablet (1,000 mcg total) by mouth daily. 90 tablet 0   No current facility-administered medications for this visit.    SURGICAL HISTORY: History reviewed. No pertinent  surgical history.  REVIEW OF SYSTEMS:  A comprehensive review of systems was negative except for: Constitutional: positive for fatigue Respiratory: positive for cough   PHYSICAL EXAMINATION: General appearance: alert, cooperative and no distress Head: Normocephalic, without obvious abnormality, atraumatic Neck: no adenopathy, no JVD, supple, symmetrical, trachea midline and thyroid not enlarged, symmetric, no tenderness/mass/nodules Lymph nodes: Cervical, supraclavicular, and axillary nodes normal. Resp: clear to auscultation bilaterally Back: symmetric, no curvature. ROM  normal. No CVA tenderness. Cardio: regular rate and rhythm, S1, S2 normal, no murmur, click, rub or gallop GI: soft, non-tender; bowel sounds normal; no masses,  no organomegaly Extremities: extremities normal, atraumatic, no cyanosis or edema  ECOG PERFORMANCE STATUS: 1 - Symptomatic but completely ambulatory  Blood pressure (!) 159/70, pulse (!) 110, temperature 99.1 F (37.3 C), temperature source Temporal, resp. rate 18, height 5\' 10"  (1.778 m), weight 241 lb 4.8 oz (109.5 kg), SpO2 97 %.  LABORATORY DATA: Lab Results  Component Value Date   WBC 7.4 12/01/2019   HGB 14.5 12/01/2019   HCT 44.8 12/01/2019   MCV 86.2 12/01/2019   PLT 229 12/01/2019      Chemistry      Component Value Date/Time   NA 137 12/01/2019 0826   NA 137 07/06/2012 0506   K 4.4 12/01/2019 0826   K 4.0 07/06/2012 0506   CL 98 12/01/2019 0826   CL 104 07/06/2012 0506   CO2 26 12/01/2019 0826   CO2 25 07/06/2012 0506   BUN 15 12/01/2019 0826   BUN 12 07/06/2012 0506   CREATININE 1.17 12/01/2019 0826   CREATININE 0.62 07/06/2012 0506      Component Value Date/Time   CALCIUM 9.9 12/01/2019 0826   CALCIUM 8.6 07/06/2012 0506   ALKPHOS 100 12/01/2019 0826   ALKPHOS 96 07/06/2012 0506   AST 9 (L) 12/01/2019 0826   ALT 13 12/01/2019 0826   ALT 30 07/06/2012 0506   BILITOT 0.3 12/01/2019 0826       RADIOGRAPHIC STUDIES: CT Chest W Contrast  Result Date: 11/01/2019 CLINICAL DATA:  CLL, disease progression in 2020, chemotherapy and oral immunotherapy EXAM: CT CHEST, ABDOMEN, AND PELVIS WITH CONTRAST TECHNIQUE: Multidetector CT imaging of the chest, abdomen and pelvis was performed following the standard protocol during bolus administration of intravenous contrast. CONTRAST:  187mL OMNIPAQUE IOHEXOL 300 MG/ML SOLN, additional oral enteric contrast COMPARISON:  PET-CT, 08/25/2019, CT abdomen pelvis, 08/06/2019, CT chest angiogram, 08/05/2019, CT chest abdomen pelvis, 04/30/2019 FINDINGS: CT CHEST FINDINGS  Cardiovascular: Aortic atherosclerosis. Normal heart size. No pericardial effusion. Mediastinum/Nodes: Continued interval decrease in size of numerous bulky bilateral axillary and mediastinal lymph nodes, an index node in the left axilla measuring 2.2 x 1.5 cm, previously 3.6 x 1.8 cm when measured similarly (series 2, image 20). Thyroid gland, trachea, and esophagus demonstrate no significant findings. Lungs/Pleura: Diffuse bilateral bronchial wall thickening. Redemonstrated fine centrilobular nodularity throughout the upper lobes of the lungs. Near total resolution of previously seen small pulmonary nodules in the lungs bilaterally, for example a 3 mm nodule of the right pulmonary apex, measuring 4 mm on prior examination dated 08/25/2019 and 6 mm on prior examination 08/05/2019. Other nodules are completely resolved. No pleural effusion or pneumothorax. Musculoskeletal: No chest wall mass or suspicious bone lesions identified. CT ABDOMEN PELVIS FINDINGS Hepatobiliary: No solid liver abnormality is seen. No gallstones, gallbladder wall thickening, or biliary dilatation. Pancreas: Unremarkable. No pancreatic ductal dilatation or surrounding inflammatory changes. Spleen: Normal in size without significant abnormality. Adrenals/Urinary Tract: Adrenal glands are  unremarkable. Kidneys are normal, without renal calculi, solid lesion, or hydronephrosis. Bladder is unremarkable. Stomach/Bowel: Stomach is within normal limits. Appendix appears normal. No evidence of bowel wall thickening, distention, or inflammatory changes. Vascular/Lymphatic: Aortic atherosclerosis. Essentially complete resolution of previously seen mesenteric, portal, and retroperitoneal lymphadenopathy, with residual subcentimeter lymph nodes (series 2, image 67). Significant interval reduction in size of bulky bilateral iliac and inguinal lymph nodes, an index right iliac lymph node measured 3.6 x 1.1 cm, previously 5.6 x 2.6 cm when measured  similarly (series 2, image 107). Reproductive: No mass or other abnormality. Other: No abdominal wall hernia or abnormality. No abdominopelvic ascites. Musculoskeletal: No acute or significant osseous findings. IMPRESSION: 1. Continued, significant interval decrease in size numerous bulky lymph nodes in the chest, abdomen, and pelvis, consistent with continued treatment response. 2. Near-total resolution of previously seen small pulmonary nodules in the lungs bilaterally, for example a 3 mm nodule of the right pulmonary apex, measuring 4 mm on prior examination dated 08/25/2019 and 7 mm on prior examination 08/05/2019. Other nodules are completely resolved. Although an unusual manifestation, the time course and apparent treatment response of these nodules suggests pulmonary involvement of lymphoma rather than acute infection or inflammation. Attention on follow-up. 3. Redemonstrated diffuse bilateral bronchial wall thickening, consistent with nonspecific infectious or inflammatory bronchitis. 4. Redemonstrated fine centrilobular nodularity throughout the upper lobes of the lungs, likely smoking-related respiratory bronchiolitis. 5. Aortic Atherosclerosis (ICD10-I70.0). Electronically Signed   By: Eddie Candle M.D.   On: 11/01/2019 11:12   CT Abdomen Pelvis W Contrast  Result Date: 11/01/2019 CLINICAL DATA:  CLL, disease progression in 2020, chemotherapy and oral immunotherapy EXAM: CT CHEST, ABDOMEN, AND PELVIS WITH CONTRAST TECHNIQUE: Multidetector CT imaging of the chest, abdomen and pelvis was performed following the standard protocol during bolus administration of intravenous contrast. CONTRAST:  116mL OMNIPAQUE IOHEXOL 300 MG/ML SOLN, additional oral enteric contrast COMPARISON:  PET-CT, 08/25/2019, CT abdomen pelvis, 08/06/2019, CT chest angiogram, 08/05/2019, CT chest abdomen pelvis, 04/30/2019 FINDINGS: CT CHEST FINDINGS Cardiovascular: Aortic atherosclerosis. Normal heart size. No pericardial effusion.  Mediastinum/Nodes: Continued interval decrease in size of numerous bulky bilateral axillary and mediastinal lymph nodes, an index node in the left axilla measuring 2.2 x 1.5 cm, previously 3.6 x 1.8 cm when measured similarly (series 2, image 20). Thyroid gland, trachea, and esophagus demonstrate no significant findings. Lungs/Pleura: Diffuse bilateral bronchial wall thickening. Redemonstrated fine centrilobular nodularity throughout the upper lobes of the lungs. Near total resolution of previously seen small pulmonary nodules in the lungs bilaterally, for example a 3 mm nodule of the right pulmonary apex, measuring 4 mm on prior examination dated 08/25/2019 and 6 mm on prior examination 08/05/2019. Other nodules are completely resolved. No pleural effusion or pneumothorax. Musculoskeletal: No chest wall mass or suspicious bone lesions identified. CT ABDOMEN PELVIS FINDINGS Hepatobiliary: No solid liver abnormality is seen. No gallstones, gallbladder wall thickening, or biliary dilatation. Pancreas: Unremarkable. No pancreatic ductal dilatation or surrounding inflammatory changes. Spleen: Normal in size without significant abnormality. Adrenals/Urinary Tract: Adrenal glands are unremarkable. Kidneys are normal, without renal calculi, solid lesion, or hydronephrosis. Bladder is unremarkable. Stomach/Bowel: Stomach is within normal limits. Appendix appears normal. No evidence of bowel wall thickening, distention, or inflammatory changes. Vascular/Lymphatic: Aortic atherosclerosis. Essentially complete resolution of previously seen mesenteric, portal, and retroperitoneal lymphadenopathy, with residual subcentimeter lymph nodes (series 2, image 67). Significant interval reduction in size of bulky bilateral iliac and inguinal lymph nodes, an index right iliac lymph node measured 3.6 x 1.1 cm,  previously 5.6 x 2.6 cm when measured similarly (series 2, image 107). Reproductive: No mass or other abnormality. Other: No  abdominal wall hernia or abnormality. No abdominopelvic ascites. Musculoskeletal: No acute or significant osseous findings. IMPRESSION: 1. Continued, significant interval decrease in size numerous bulky lymph nodes in the chest, abdomen, and pelvis, consistent with continued treatment response. 2. Near-total resolution of previously seen small pulmonary nodules in the lungs bilaterally, for example a 3 mm nodule of the right pulmonary apex, measuring 4 mm on prior examination dated 08/25/2019 and 7 mm on prior examination 08/05/2019. Other nodules are completely resolved. Although an unusual manifestation, the time course and apparent treatment response of these nodules suggests pulmonary involvement of lymphoma rather than acute infection or inflammation. Attention on follow-up. 3. Redemonstrated diffuse bilateral bronchial wall thickening, consistent with nonspecific infectious or inflammatory bronchitis. 4. Redemonstrated fine centrilobular nodularity throughout the upper lobes of the lungs, likely smoking-related respiratory bronchiolitis. 5. Aortic Atherosclerosis (ICD10-I70.0). Electronically Signed   By: Eddie Candle M.D.   On: 11/01/2019 11:12    ASSESSMENT AND PLAN: This is a very pleasant 49 years old white male with CLL/small lymphocytic lymphoma diagnosed in 2010 and has been in observation for several years but the patient had significant disease progression and August 2020. He is currently undergoing treatment with obinutuzumab every 4 weeks in addition to acalabrutinib 100 mg p.o. twice daily.  Status post 4 cycles. The patient has been tolerating this treatment well with no concerning adverse effects. I recommended for him to proceed with cycle #5 today as planned. For the hypertension, the patient was strongly advised to monitor his blood pressure closely at home and to report to his primary care physician for adjustment of his medication if needed. He will come back for follow-up visit in 4  weeks for evaluation before the next cycle of his treatment. He was advised to call immediately if he has any concerning symptoms in the interval. The patient voices understanding of current disease status and treatment options and is in agreement with the current care plan.  All questions were answered. The patient knows to call the clinic with any problems, questions or concerns. We can certainly see the patient much sooner if necessary.   Disclaimer: This note was dictated with voice recognition software. Similar sounding words can inadvertently be transcribed and may not be corrected upon review.

## 2019-12-01 NOTE — Patient Instructions (Signed)
[facility] Discharge Instructions for Patients Receiving Chemotherapy  Today you received the following chemotherapy agents: obinutuzumab.  To help prevent nausea and vomiting after your treatment, we encourage you to take your nausea medication as directed.   If you develop nausea and vomiting that is not controlled by your nausea medication, call the clinic.   BELOW ARE SYMPTOMS THAT SHOULD BE REPORTED IMMEDIATELY:  *FEVER GREATER THAN 100.5 F  *CHILLS WITH OR WITHOUT FEVER  NAUSEA AND VOMITING THAT IS NOT CONTROLLED WITH YOUR NAUSEA MEDICATION  *UNUSUAL SHORTNESS OF BREATH  *UNUSUAL BRUISING OR BLEEDING  TENDERNESS IN MOUTH AND THROAT WITH OR WITHOUT PRESENCE OF ULCERS  *URINARY PROBLEMS  *BOWEL PROBLEMS  UNUSUAL RASH Items with * indicate a potential emergency and should be followed up as soon as possible.  Feel free to call the clinic should you have any questions or concerns. The clinic phone number is [phone].  Please show the CHEMO ALERT CARD at check-in to the Emergency Department and triage nurse.   

## 2019-12-17 ENCOUNTER — Other Ambulatory Visit: Payer: Self-pay | Admitting: *Deleted

## 2019-12-17 ENCOUNTER — Telehealth: Payer: Self-pay | Admitting: *Deleted

## 2019-12-17 DIAGNOSIS — C9110 Chronic lymphocytic leukemia of B-cell type not having achieved remission: Secondary | ICD-10-CM

## 2019-12-17 MED ORDER — CALQUENCE 100 MG PO CAPS: ORAL_CAPSULE | ORAL | 2 refills | Status: DC

## 2019-12-17 NOTE — Telephone Encounter (Signed)
Received message from [facility] stating they had faxed a request for refill of Calquence. Prescription escribed.   Pharmacy called and said they cannot fill Calquence as this is a specialty medication. They will be filling all of his other medications.

## 2019-12-22 ENCOUNTER — Telehealth: Payer: Self-pay | Admitting: Medical Oncology

## 2019-12-22 DIAGNOSIS — C9110 Chronic lymphocytic leukemia of B-cell type not having achieved remission: Secondary | ICD-10-CM

## 2019-12-22 MED ORDER — CALQUENCE 100 MG PO CAPS: ORAL_CAPSULE | ORAL | 2 refills | Status: DC

## 2019-12-22 NOTE — Telephone Encounter (Signed)
Patient is still active with [facility] and they have been filling his Calquence monthly. [facility] was the correct place to send his refill. They last filled his Calquence on 4/20 and they are set to give him a call for him next fill on 5/14. Everything should be good to go!  -[employer]

## 2019-12-22 NOTE — Telephone Encounter (Signed)
[facility] cannot fill calquence.

## 2019-12-24 NOTE — Progress Notes (Signed)
Pharmacist Chemotherapy Monitoring - Follow Up Assessment    I verify that I have reviewed each item in the below checklist:  . Regimen for the patient is scheduled for the appropriate day and plan matches scheduled date. Marland Kitchen Appropriate non-routine labs are ordered dependent on drug ordered. . If applicable, additional medications reviewed and ordered per protocol based on lifetime cumulative doses and/or treatment regimen.   Plan for follow-up and/or issues identified: No . I-vent associated with next due treatment: Yes . MD and/or nursing notified: No   Kennith Center, Pharm.D., CPP 12/24/2019@4 :16 PM

## 2019-12-28 NOTE — Progress Notes (Signed)
[place] OFFICE PROGRESS NOTE  Larry Coffin, MD [place] [id number]  DIAGNOSIS: CLL/small lymphocytic lymphoma diagnosed in 2010 and has been in observation for several years with no treatment. 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 The cytogenetics were negative for P 17.   PRIOR THERAPY: None  CURRENT THERAPY: Obinutuzumab 1,000 mg IV. First dose on 08/10/2019. He will also begin acalabrutinib 100 mg BID starting on 08/19/2019.  Status post 5 cycles.  INTERVAL HISTORY: Larry Lam 49 y.o. male returns to the clinic for a follow up visit. The patient is feeling well without any concerning complaints. He continues to tolerate his treatment well without any adverse side effects. He denies any fevers, chills, night sweats, or weight loss. He denies any adenopathy. He denies any recent signs or symptoms of infection including nasal congestion, sore throat, cough, shortness of breath, dysuria, or skin infections.  He denies any nausea, vomiting, diarrhea, or constipation.  He denies any peripheral neuropathy.  The patient is here today for evaluation and repeat blood work before starting cycle #6 today as scheduled.  MEDICAL HISTORY: Past Medical History:  Diagnosis Date  . Asthma   . COPD (chronic obstructive pulmonary disease) ([place])   . Diabetes mellitus without complication ([place])   . Hypertension   . Seizures ([place])    3-4 years ago. Single episode.  . Tobacco use     ALLERGIES:  is allergic to amlodipine besylate-valsartan and hydrochlorothiazide.  MEDICATIONS:  Current Outpatient Medications  Medication Sig Dispense Refill  . acalabrutinib (CALQUENCE) 100 MG capsule TAKE 1 CAPSULE (100 MG TOTAL) BY MOUTH 2 (TWO) TIMES DAILY. 60 capsule 2  . albuterol (PROVENTIL HFA;VENTOLIN HFA) 108 (90 Base) MCG/ACT inhaler Inhale 2 puffs into the lungs every 6 (six) hours as needed for wheezing or  shortness of breath. 1 Inhaler 0  . allopurinol (ZYLOPRIM) 100 MG tablet Take 1 tablet (100 mg total) by mouth 2 (two) times daily. 60 tablet 0  . ASPIRIN 81 PO Take 81 mg by mouth.    Marland Kitchen atorvastatin (LIPITOR) 10 MG tablet Take 10 mg by mouth daily.    Marland Kitchen escitalopram (LEXAPRO) 20 MG tablet Take 20 mg by mouth daily.    . Fluticasone-Salmeterol (ADVAIR) 250-50 MCG/DOSE AEPB Inhale into the lungs.    . Insulin Detemir (LEVEMIR) 100 UNIT/ML Pen Inject 30 Units into the skin daily. 18 mL 0  . Insulin Pen Needle (PEN NEEDLES 3/16") 31G X 5 MM MISC Use as directed with insulin pen 100 each 0  . ipratropium-albuterol (DUONEB) 0.5-2.5 (3) MG/3ML SOLN Take 3 mLs by nebulization every 6 (six) hours as needed. J44.9 and J44.1 360 mL 0  . lisinopril (ZESTRIL) 40 MG tablet Take 40 mg by mouth daily.    . metFORMIN (GLUCOPHAGE-XR) 500 MG 24 hr tablet Take 1,000 mg by mouth 2 (two) times daily.    . pantoprazole (PROTONIX) 40 MG tablet Take 1 tablet (40 mg total) by mouth daily. 30 tablet 11  . TRELEGY ELLIPTA 100-62.5-25 MCG/INH AEPB Inhale 1 puff into the lungs daily.    Marland Kitchen umeclidinium bromide (INCRUSE ELLIPTA) 62.5 MCG/INH AEPB Inhale into the lungs.    . vitamin B-12 (CYANOCOBALAMIN) 1000 MCG tablet Take 1 tablet (1,000 mcg total) by mouth daily. 90 tablet 0   No current facility-administered medications for this visit.   Facility-Administered Medications Ordered in Other Visits  Medication Dose Route Frequency Provider Last  Rate Last Admin  . insulin regular (NOVOLIN R) 100 units/mL injection 10 Units  10 Units Subcutaneous Once Curt Bears, MD      . obinutuzumab (GAZYVA) 1,000 mg in sodium chloride 0.9 % 250 mL (3.4483 mg/mL) chemo infusion  1,000 mg Intravenous Once Curt Bears, MD        SURGICAL HISTORY: No past surgical history on file.  REVIEW OF SYSTEMS:   Review of Systems  Constitutional: Negative for appetite change, chills, fatigue, fever and unexpected weight change.  HENT:  Negative for mouth sores, nosebleeds, sore throat and trouble swallowing.   Eyes: Negative for eye problems and icterus.  Respiratory: Negative for cough, hemoptysis, shortness of breath and wheezing.   Cardiovascular: Negative for chest pain and leg swelling.  Gastrointestinal: Negative for abdominal pain, constipation, diarrhea, nausea and vomiting.  Genitourinary: Negative for bladder incontinence, difficulty urinating, dysuria, frequency and hematuria.   Musculoskeletal: Negative for back pain, gait problem, neck pain and neck stiffness.  Skin: Negative for itching and rash.  Neurological: Negative for dizziness, extremity weakness, gait problem, headaches, light-headedness and seizures.  Hematological: Negative for adenopathy. Does not bruise/bleed easily.  Psychiatric/Behavioral: Negative for confusion, depression and sleep disturbance. The patient is not nervous/anxious.     PHYSICAL EXAMINATION:  There were no vitals taken for this visit.  ECOG PERFORMANCE STATUS: 1 - Symptomatic but completely ambulatory  Physical Exam  Constitutional: Oriented to person, place, and time and well-developed, well-nourished, and in no distress.  HENT:  Head: Normocephalic and atraumatic.  Mouth/Throat: Oropharynx is clear and moist. No oropharyngeal exudate.  Eyes: Conjunctivae are normal. Right eye exhibits no discharge. Left eye exhibits no discharge. No scleral icterus.  Neck: Normal range of motion. Neck supple.  Cardiovascular: Tachycardic, regular rhythm, normal heart sounds and intact distal pulses.   Pulmonary/Chest: Effort normal and breath sounds normal. No respiratory distress. No wheezes. No rales.  Abdominal: Soft. Bowel sounds are normal. Exhibits no distension and no mass. There is no tenderness.  Musculoskeletal: Normal range of motion. Exhibits no edema.  Lymphadenopathy:    No cervical adenopathy.  Neurological: Alert and oriented to person, place, and time. Exhibits normal  muscle tone. Gait normal. Coordination normal.  Skin: Skin is warm and dry. No rash noted. Not diaphoretic. No erythema. No pallor.  Psychiatric: Mood, memory and judgment normal.  Vitals reviewed.  LABORATORY DATA: Lab Results  Component Value Date   WBC 11.3 (H) 12/30/2019   HGB 15.1 12/30/2019   HCT 47.9 12/30/2019   MCV 85.5 12/30/2019   PLT 159 12/30/2019      Chemistry      Component Value Date/Time   NA 132 (L) 12/30/2019 0950   NA 137 07/06/2012 0506   K 5.4 (H) 12/30/2019 0950   K 4.0 07/06/2012 0506   CL 97 (L) 12/30/2019 0950   CL 104 07/06/2012 0506   CO2 24 12/30/2019 0950   CO2 25 07/06/2012 0506   BUN 33 (H) 12/30/2019 0950   BUN 12 07/06/2012 0506   CREATININE 1.42 (H) 12/30/2019 0950   CREATININE 0.62 07/06/2012 0506      Component Value Date/Time   CALCIUM 9.6 12/30/2019 0950   CALCIUM 8.6 07/06/2012 0506   ALKPHOS 111 12/30/2019 0950   ALKPHOS 96 07/06/2012 0506   AST 12 (L) 12/30/2019 0950   ALT 19 12/30/2019 0950   ALT 30 07/06/2012 0506   BILITOT 0.3 12/30/2019 0950       RADIOGRAPHIC STUDIES:  No results found.  ASSESSMENT/PLAN:  This is a very pleasant 49 year old Caucasian male diagnosed with CLL/small lymphocytic lymphoma diagnosed in 2010 and has been in observation for several years with no treatment. 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 The cytogenetics were negative for P 17.   The patient is currently undergoing treatment with Obinutuzumab 1,000 mg IV. He is status post day 1 cycle 5.  He tolerated well without any adverse side effects. He has been on acalabrutinib 100 mg BID starting on 08/19/2019  Labs were reviewed.  Recommend he proceed with cycle #6 today as scheduled.   I will arrange for a restaging CT scan of the chest, abdomen, and pelvis prior to his next cycle of treatment.   We will see him back for a follow up visit in 4 weeks for evaluation and to review his scan  before starting cycle #7  We will administer 10 units of insulin for his hyperglycemia. He was instructed to monitor his blood glucose closer at home. Patient advised to stay hydrated, his creatinine is a little elevated today compared to his baseline. Will continue to monitor.   The patient was advised to call immediately if he has any concerning symptoms in the interval. The patient voices understanding of current disease status and treatment options and is in agreement with the current care plan. All questions were answered. The patient knows to call the clinic with any problems, questions or concerns. We can certainly see the patient much sooner if necessary.   Orders Placed This Encounter  Procedures  . CT Chest W Contrast    Standing Status:   Future    Standing Expiration Date:   12/29/2020    Order Specific Question:   ** REASON FOR EXAM (FREE TEXT)    Answer:   Restaging CLL/SLL    Order Specific Question:   If indicated for the ordered procedure, I authorize the administration of contrast media per Radiology protocol    Answer:   Yes    Order Specific Question:   Preferred imaging location?    Answer:   [facility]    Order Specific Question:   Radiology Contrast Protocol - do NOT remove file path    Answer:   \\[email or web address]  . CT Abdomen Pelvis W Contrast    Standing Status:   Future    Standing Expiration Date:   12/29/2020    Order Specific Question:   ** REASON FOR EXAM (FREE TEXT)    Answer:   restaging CLL/SLL    Order Specific Question:   If indicated for the ordered procedure, I authorize the administration of contrast media per Radiology protocol    Answer:   Yes    Order Specific Question:   Preferred imaging location?    Answer:   [facility]    Order Specific Question:   Is Oral Contrast requested for this exam?    Answer:   Yes, Per Radiology protocol    Order Specific Question:   Radiology Contrast Protocol - do NOT  remove file path    Answer:   \\[email or web address]     [place], PA-C 12/30/19

## 2019-12-30 ENCOUNTER — Inpatient Hospital Stay: Payer: Medicare Other

## 2019-12-30 ENCOUNTER — Other Ambulatory Visit: Payer: Self-pay

## 2019-12-30 ENCOUNTER — Inpatient Hospital Stay: Payer: Medicare Other | Attending: Physician Assistant | Admitting: Physician Assistant

## 2019-12-30 VITALS — BP 137/65 | HR 94 | Temp 98.4°F | Resp 18 | Wt 244.5 lb

## 2019-12-30 DIAGNOSIS — E119 Type 2 diabetes mellitus without complications: Secondary | ICD-10-CM

## 2019-12-30 DIAGNOSIS — E1165 Type 2 diabetes mellitus with hyperglycemia: Secondary | ICD-10-CM | POA: Insufficient documentation

## 2019-12-30 DIAGNOSIS — Z5112 Encounter for antineoplastic immunotherapy: Secondary | ICD-10-CM | POA: Diagnosis not present

## 2019-12-30 DIAGNOSIS — C9110 Chronic lymphocytic leukemia of B-cell type not having achieved remission: Secondary | ICD-10-CM

## 2019-12-30 DIAGNOSIS — Z7951 Long term (current) use of inhaled steroids: Secondary | ICD-10-CM | POA: Diagnosis not present

## 2019-12-30 DIAGNOSIS — Z7982 Long term (current) use of aspirin: Secondary | ICD-10-CM | POA: Insufficient documentation

## 2019-12-30 DIAGNOSIS — E0865 Diabetes mellitus due to underlying condition with hyperglycemia: Secondary | ICD-10-CM | POA: Diagnosis not present

## 2019-12-30 DIAGNOSIS — Z5111 Encounter for antineoplastic chemotherapy: Secondary | ICD-10-CM

## 2019-12-30 DIAGNOSIS — I10 Essential (primary) hypertension: Secondary | ICD-10-CM | POA: Diagnosis not present

## 2019-12-30 DIAGNOSIS — J449 Chronic obstructive pulmonary disease, unspecified: Secondary | ICD-10-CM | POA: Insufficient documentation

## 2019-12-30 DIAGNOSIS — Z79899 Other long term (current) drug therapy: Secondary | ICD-10-CM | POA: Diagnosis not present

## 2019-12-30 DIAGNOSIS — Z794 Long term (current) use of insulin: Secondary | ICD-10-CM | POA: Insufficient documentation

## 2019-12-30 LAB — CMP (CANCER CENTER ONLY)
ALT: 19 U/L (ref 0–44)
AST: 12 U/L — ABNORMAL LOW (ref 15–41)
Albumin: 3.9 g/dL (ref 3.5–5.0)
Alkaline Phosphatase: 111 U/L (ref 38–126)
Anion gap: 11 (ref 5–15)
BUN: 33 mg/dL — ABNORMAL HIGH (ref 6–20)
CO2: 24 mmol/L (ref 22–32)
Calcium: 9.6 mg/dL (ref 8.9–10.3)
Chloride: 97 mmol/L — ABNORMAL LOW (ref 98–111)
Creatinine: 1.42 mg/dL — ABNORMAL HIGH (ref 0.61–1.24)
GFR, Est AFR Am: >60 mL/min (ref >60)
GFR, Estimated: 57 mL/min — ABNORMAL LOW (ref >60)
Glucose, Bld: 473 mg/dL — ABNORMAL HIGH (ref 70–99)
Potassium: 5.4 mmol/L — ABNORMAL HIGH (ref 3.5–5.1)
Sodium: 132 mmol/L — ABNORMAL LOW (ref 135–145)
Total Bilirubin: 0.3 mg/dL (ref 0.3–1.2)
Total Protein: 7.1 g/dL (ref 6.5–8.1)

## 2019-12-30 LAB — CBC WITH DIFFERENTIAL (CANCER CENTER ONLY)
Abs Immature Granulocytes: 0.14 10*3/uL — ABNORMAL HIGH (ref 0.00–0.07)
Basophils Absolute: 0.1 10*3/uL (ref 0.0–0.1)
Basophils Relative: 1 %
Eosinophils Absolute: 0.1 10*3/uL (ref 0.0–0.5)
Eosinophils Relative: 1 %
HCT: 47.9 % (ref 39.0–52.0)
Hemoglobin: 15.1 g/dL (ref 13.0–17.0)
Immature Granulocytes: 1 %
Lymphocytes Relative: 15 %
Lymphs Abs: 1.7 10*3/uL (ref 0.7–4.0)
MCH: 27 pg (ref 26.0–34.0)
MCHC: 31.5 g/dL (ref 30.0–36.0)
MCV: 85.5 fL (ref 80.0–100.0)
Monocytes Absolute: 0.8 10*3/uL (ref 0.1–1.0)
Monocytes Relative: 7 %
Neutro Abs: 8.5 10*3/uL — ABNORMAL HIGH (ref 1.7–7.7)
Neutrophils Relative %: 75 %
Platelet Count: 159 10*3/uL (ref 150–400)
RBC: 5.6 MIL/uL (ref 4.22–5.81)
RDW: 16.2 % — ABNORMAL HIGH (ref 11.5–15.5)
WBC Count: 11.3 10*3/uL — ABNORMAL HIGH (ref 4.0–10.5)
nRBC: 0 % (ref 0.0–0.2)

## 2019-12-30 LAB — URIC ACID: Uric Acid, Serum: 6.7 mg/dL (ref 3.7–8.6)

## 2019-12-30 LAB — LACTATE DEHYDROGENASE: LDH: 139 U/L (ref 98–192)

## 2019-12-30 MED ORDER — ACETAMINOPHEN 325 MG PO TABS
650.0000 mg | ORAL_TABLET | Freq: Once | ORAL | Status: AC
  Administered 2019-12-30: 650 mg via ORAL

## 2019-12-30 MED ORDER — DIPHENHYDRAMINE HCL 50 MG/ML IJ SOLN
INTRAMUSCULAR | Status: AC
  Filled 2019-12-30: qty 1

## 2019-12-30 MED ORDER — SODIUM CHLORIDE 0.9 % IV SOLN
Freq: Once | INTRAVENOUS | Status: AC
  Filled 2019-12-30: qty 250

## 2019-12-30 MED ORDER — INSULIN REGULAR HUMAN 100 UNIT/ML IJ SOLN
INTRAMUSCULAR | Status: AC
  Filled 2019-12-30: qty 1

## 2019-12-30 MED ORDER — ACETAMINOPHEN 325 MG PO TABS
ORAL_TABLET | ORAL | Status: AC
  Filled 2019-12-30: qty 2

## 2019-12-30 MED ORDER — DEXAMETHASONE SODIUM PHOSPHATE 10 MG/ML IJ SOLN: 10.0000 mg | Freq: Once | INTRAMUSCULAR | Status: DC

## 2019-12-30 MED ORDER — INSULIN REGULAR HUMAN 100 UNIT/ML IJ SOLN
10.0000 [IU] | Freq: Once | INTRAMUSCULAR | Status: AC
  Administered 2019-12-30: 10 [IU] via SUBCUTANEOUS

## 2019-12-30 MED ORDER — SODIUM CHLORIDE 0.9 % IV SOLN
10.0000 mg | Freq: Once | INTRAVENOUS | Status: AC
  Administered 2019-12-30: 10 mg via INTRAVENOUS
  Filled 2019-12-30: qty 10

## 2019-12-30 MED ORDER — SODIUM CHLORIDE 0.9 % IV SOLN
1000.0000 mg | Freq: Once | INTRAVENOUS | Status: AC
  Administered 2019-12-30: 1000 mg via INTRAVENOUS
  Filled 2019-12-30: qty 40

## 2019-12-30 MED ORDER — DIPHENHYDRAMINE HCL 50 MG/ML IJ SOLN
25.0000 mg | Freq: Once | INTRAMUSCULAR | Status: AC
  Administered 2019-12-30: 25 mg via INTRAVENOUS

## 2019-12-30 NOTE — Patient Instructions (Signed)
[facility] Discharge Instructions for Patients Receiving Chemotherapy  Today you received the following chemotherapy agents: obinutuzumab.  To help prevent nausea and vomiting after your treatment, we encourage you to take your nausea medication as directed.   If you develop nausea and vomiting that is not controlled by your nausea medication, call the clinic.   BELOW ARE SYMPTOMS THAT SHOULD BE REPORTED IMMEDIATELY:  *FEVER GREATER THAN 100.5 F  *CHILLS WITH OR WITHOUT FEVER  NAUSEA AND VOMITING THAT IS NOT CONTROLLED WITH YOUR NAUSEA MEDICATION  *UNUSUAL SHORTNESS OF BREATH  *UNUSUAL BRUISING OR BLEEDING  TENDERNESS IN MOUTH AND THROAT WITH OR WITHOUT PRESENCE OF ULCERS  *URINARY PROBLEMS  *BOWEL PROBLEMS  UNUSUAL RASH Items with * indicate a potential emergency and should be followed up as soon as possible.  Feel free to call the clinic should you have any questions or concerns. The clinic phone number is [phone].  Please show the CHEMO ALERT CARD at check-in to the Emergency Department and triage nurse.   

## 2019-12-30 NOTE — Progress Notes (Signed)
Per Cassie Heilingoetter, ok to proceed with treatment with only CBC results.

## 2020-01-03 LAB — GLUCOSE, CAPILLARY: Glucose-Capillary: 474 mg/dL — ABNORMAL HIGH (ref 70–99)

## 2020-01-04 ENCOUNTER — Telehealth: Payer: Self-pay | Admitting: Medical Oncology

## 2020-01-04 NOTE — Telephone Encounter (Signed)
-----   Message from Curt Bears, MD sent at 01/03/2020  4:47 PM EDT ----- He need to monitor his blood glucose closely at home. ----- Message ----- From: Interface, Lab In [place] Sent: 01/03/2020   4:39 PM EDT To: Curt Bears, MD

## 2020-01-04 NOTE — Telephone Encounter (Signed)
LVM re glucose.

## 2020-01-21 ENCOUNTER — Other Ambulatory Visit: Payer: Self-pay

## 2020-01-21 ENCOUNTER — Ambulatory Visit (HOSPITAL_COMMUNITY)
Admission: RE | Admit: 2020-01-21 | Discharge: 2020-01-21 | Disposition: A | Discharge status: Home / Self Care | Payer: Medicare Other | Source: Ambulatory Visit | Attending: Physician Assistant | Admitting: Physician Assistant

## 2020-01-21 ENCOUNTER — Encounter (HOSPITAL_COMMUNITY): Payer: Self-pay

## 2020-01-21 DIAGNOSIS — C9110 Chronic lymphocytic leukemia of B-cell type not having achieved remission: Secondary | ICD-10-CM

## 2020-01-21 HISTORY — DX: Malignant (primary) neoplasm, unspecified: C80.1

## 2020-01-21 MED ORDER — IOHEXOL 300 MG/ML  SOLN
100.0000 mL | Freq: Once | INTRAMUSCULAR | Status: AC | PRN
  Administered 2020-01-21: 100 mL via INTRAVENOUS

## 2020-01-21 MED ORDER — SODIUM CHLORIDE (PF) 0.9 % IJ SOLN
INTRAMUSCULAR | Status: AC
  Filled 2020-01-21: qty 50

## 2020-01-27 ENCOUNTER — Other Ambulatory Visit: Payer: Self-pay | Admitting: Medical Oncology

## 2020-01-27 ENCOUNTER — Inpatient Hospital Stay: Payer: Medicare Other

## 2020-01-27 ENCOUNTER — Encounter: Payer: Self-pay | Admitting: Internal Medicine

## 2020-01-27 ENCOUNTER — Inpatient Hospital Stay: Payer: Medicare Other | Attending: Physician Assistant | Admitting: Internal Medicine

## 2020-01-27 ENCOUNTER — Other Ambulatory Visit: Payer: Self-pay

## 2020-01-27 VITALS — BP 137/64 | HR 94 | Temp 98.8°F | Resp 18

## 2020-01-27 VITALS — BP 164/71 | HR 111 | Temp 99.3°F | Resp 18 | Ht 70.0 in | Wt 243.6 lb

## 2020-01-27 DIAGNOSIS — Z5111 Encounter for antineoplastic chemotherapy: Secondary | ICD-10-CM | POA: Diagnosis not present

## 2020-01-27 DIAGNOSIS — J449 Chronic obstructive pulmonary disease, unspecified: Secondary | ICD-10-CM | POA: Insufficient documentation

## 2020-01-27 DIAGNOSIS — E119 Type 2 diabetes mellitus without complications: Secondary | ICD-10-CM | POA: Diagnosis not present

## 2020-01-27 DIAGNOSIS — Z7982 Long term (current) use of aspirin: Secondary | ICD-10-CM | POA: Insufficient documentation

## 2020-01-27 DIAGNOSIS — E0865 Diabetes mellitus due to underlying condition with hyperglycemia: Secondary | ICD-10-CM

## 2020-01-27 DIAGNOSIS — C9110 Chronic lymphocytic leukemia of B-cell type not having achieved remission: Secondary | ICD-10-CM | POA: Diagnosis present

## 2020-01-27 DIAGNOSIS — Z5112 Encounter for antineoplastic immunotherapy: Secondary | ICD-10-CM | POA: Diagnosis present

## 2020-01-27 DIAGNOSIS — J441 Chronic obstructive pulmonary disease with (acute) exacerbation: Secondary | ICD-10-CM | POA: Diagnosis not present

## 2020-01-27 DIAGNOSIS — Z7951 Long term (current) use of inhaled steroids: Secondary | ICD-10-CM | POA: Insufficient documentation

## 2020-01-27 DIAGNOSIS — I10 Essential (primary) hypertension: Secondary | ICD-10-CM | POA: Diagnosis not present

## 2020-01-27 DIAGNOSIS — R739 Hyperglycemia, unspecified: Secondary | ICD-10-CM

## 2020-01-27 DIAGNOSIS — E1165 Type 2 diabetes mellitus with hyperglycemia: Secondary | ICD-10-CM

## 2020-01-27 DIAGNOSIS — Z79899 Other long term (current) drug therapy: Secondary | ICD-10-CM | POA: Diagnosis not present

## 2020-01-27 DIAGNOSIS — Z794 Long term (current) use of insulin: Secondary | ICD-10-CM | POA: Insufficient documentation

## 2020-01-27 LAB — CBC WITH DIFFERENTIAL (CANCER CENTER ONLY)
Abs Immature Granulocytes: 0.09 10*3/uL — ABNORMAL HIGH (ref 0.00–0.07)
Basophils Absolute: 0.1 10*3/uL (ref 0.0–0.1)
Basophils Relative: 1 %
Eosinophils Absolute: 0.1 10*3/uL (ref 0.0–0.5)
Eosinophils Relative: 1 %
HCT: 44.7 % (ref 39.0–52.0)
Hemoglobin: 14.6 g/dL (ref 13.0–17.0)
Immature Granulocytes: 1 %
Lymphocytes Relative: 14 %
Lymphs Abs: 1.4 10*3/uL (ref 0.7–4.0)
MCH: 27.8 pg (ref 26.0–34.0)
MCHC: 32.7 g/dL (ref 30.0–36.0)
MCV: 85 fL (ref 80.0–100.0)
Monocytes Absolute: 1.2 10*3/uL — ABNORMAL HIGH (ref 0.1–1.0)
Monocytes Relative: 12 %
Neutro Abs: 7.4 10*3/uL (ref 1.7–7.7)
Neutrophils Relative %: 71 %
Platelet Count: 171 10*3/uL (ref 150–400)
RBC: 5.26 MIL/uL (ref 4.22–5.81)
RDW: 16.1 % — ABNORMAL HIGH (ref 11.5–15.5)
WBC Count: 10.4 10*3/uL (ref 4.0–10.5)
nRBC: 0 % (ref 0.0–0.2)

## 2020-01-27 LAB — CMP (CANCER CENTER ONLY)
ALT: 23 U/L (ref 0–44)
AST: 15 U/L (ref 15–41)
Albumin: 3.9 g/dL (ref 3.5–5.0)
Alkaline Phosphatase: 118 U/L (ref 38–126)
Anion gap: 15 (ref 5–15)
BUN: 17 mg/dL (ref 6–20)
CO2: 21 mmol/L — ABNORMAL LOW (ref 22–32)
Calcium: 9.6 mg/dL (ref 8.9–10.3)
Chloride: 96 mmol/L — ABNORMAL LOW (ref 98–111)
Creatinine: 1.64 mg/dL — ABNORMAL HIGH (ref 0.61–1.24)
GFR, Est AFR Am: 56 mL/min — ABNORMAL LOW (ref >60)
GFR, Estimated: 48 mL/min — ABNORMAL LOW (ref >60)
Glucose, Bld: 546 mg/dL — ABNORMAL HIGH (ref 70–99)
Potassium: 4.7 mmol/L (ref 3.5–5.1)
Sodium: 132 mmol/L — ABNORMAL LOW (ref 135–145)
Total Bilirubin: 0.4 mg/dL (ref 0.3–1.2)
Total Protein: 6.9 g/dL (ref 6.5–8.1)

## 2020-01-27 LAB — GLUCOSE, CAPILLARY: Glucose-Capillary: 416 mg/dL — ABNORMAL HIGH (ref 70–99)

## 2020-01-27 LAB — LACTATE DEHYDROGENASE: LDH: 163 U/L (ref 98–192)

## 2020-01-27 LAB — URIC ACID: Uric Acid, Serum: 6.9 mg/dL (ref 3.7–8.6)

## 2020-01-27 MED ORDER — SODIUM CHLORIDE 0.9 % IV SOLN
Freq: Once | INTRAVENOUS | Status: AC
  Filled 2020-01-27: qty 250

## 2020-01-27 MED ORDER — DIPHENHYDRAMINE HCL 50 MG/ML IJ SOLN
25.0000 mg | Freq: Once | INTRAMUSCULAR | Status: AC
  Administered 2020-01-27: 25 mg via INTRAVENOUS

## 2020-01-27 MED ORDER — INSULIN REGULAR HUMAN 100 UNIT/ML IJ SOLN
INTRAMUSCULAR | Status: AC
  Filled 2020-01-27: qty 1

## 2020-01-27 MED ORDER — SODIUM CHLORIDE 0.9 % IV SOLN
1000.0000 mg | Freq: Once | INTRAVENOUS | Status: AC
  Administered 2020-01-27: 1000 mg via INTRAVENOUS
  Filled 2020-01-27: qty 40

## 2020-01-27 MED ORDER — ACETAMINOPHEN 325 MG PO TABS
ORAL_TABLET | ORAL | Status: AC
  Filled 2020-01-27: qty 2

## 2020-01-27 MED ORDER — SODIUM CHLORIDE 0.9 % IV SOLN
10.0000 mg | Freq: Once | INTRAVENOUS | Status: AC
  Administered 2020-01-27: 10 mg via INTRAVENOUS
  Filled 2020-01-27: qty 10

## 2020-01-27 MED ORDER — INSULIN REGULAR HUMAN 100 UNIT/ML IJ SOLN
15.0000 [IU] | Freq: Once | INTRAMUSCULAR | Status: AC
  Administered 2020-01-27: 15 [IU] via SUBCUTANEOUS

## 2020-01-27 MED ORDER — ACETAMINOPHEN 325 MG PO TABS
650.0000 mg | ORAL_TABLET | Freq: Once | ORAL | Status: AC
  Administered 2020-01-27: 650 mg via ORAL

## 2020-01-27 MED ORDER — DEXAMETHASONE SODIUM PHOSPHATE 10 MG/ML IJ SOLN: 10.0000 mg | Freq: Once | INTRAMUSCULAR | Status: DC

## 2020-01-27 MED ORDER — DIPHENHYDRAMINE HCL 50 MG/ML IJ SOLN
INTRAMUSCULAR | Status: AC
  Filled 2020-01-27: qty 1

## 2020-01-27 NOTE — Progress Notes (Signed)
Per Dr. Julien Nordmann patient will receive 15 units of insulin regular for glucose of 546. Orders entered.   Leron Croak, PharmD, BCPS PGY2 Hematology/Oncology Pharmacy Resident 01/27/2020 9:18 AM

## 2020-01-27 NOTE — Progress Notes (Signed)
[place] [phone]   Fax:([phone]  OFFICE PROGRESS NOTE  [name], MD [place] [id number]  DIAGNOSIS: CLL/small lymphocytic lymphoma diagnosed in 2010 and has been in observation for several years with no treatment. 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 The cytogenetics were negative for P 17.   PRIOR THERAPY: None  CURRENT THERAPY: Obinutuzumab 1,000 mg IV. First dose on 08/10/2019. He will also begin acalabrutinib 100 mg BID starting on 08/19/2019.  Status post 6 cycles.  INTERVAL HISTORY: Larry Lam 49 y.o. male returns to the clinic today for follow-up visit.  The patient is feeling fine today with no concerning complaints except for mild cough and shortness of breath with exertion.  He denied having any chest pain or hemoptysis.  He denied having any recent weight loss or night sweats.  He has no nausea, vomiting, diarrhea or constipation.  He denied having any headache or visual changes.  He has no fever or chills.  The patient continues to tolerate his treatment with obinutuzumab and acalabrutinib fairly well.  Is here today for evaluation with repeat CT scan of the chest, abdomen pelvis for restaging of his disease.  MEDICAL HISTORY: Past Medical History:  Diagnosis Date   Asthma    cll dx'd 04/2019   COPD (chronic obstructive pulmonary disease) (HCC)    Diabetes mellitus without complication ([place])    Hypertension    Seizures ([place])    3-4 years ago. Single episode.   Tobacco use     ALLERGIES:  is allergic to amlodipine besylate-valsartan and hydrochlorothiazide.  MEDICATIONS:  Current Outpatient Medications  Medication Sig Dispense Refill   acalabrutinib (CALQUENCE) 100 MG capsule TAKE 1 CAPSULE (100 MG TOTAL) BY MOUTH 2 (TWO) TIMES DAILY. 60 capsule 2   albuterol (PROVENTIL HFA;VENTOLIN HFA) 108 (90 Base) MCG/ACT inhaler Inhale 2  puffs into the lungs every 6 (six) hours as needed for wheezing or shortness of breath. 1 Inhaler 0   allopurinol (ZYLOPRIM) 100 MG tablet Take 1 tablet (100 mg total) by mouth 2 (two) times daily. 60 tablet 0   ASPIRIN 81 PO Take 81 mg by mouth.     atorvastatin (LIPITOR) 10 MG tablet Take 10 mg by mouth daily.     escitalopram (LEXAPRO) 20 MG tablet Take 20 mg by mouth daily.     Fluticasone-Salmeterol (ADVAIR) 250-50 MCG/DOSE AEPB Inhale into the lungs.     Insulin Detemir (LEVEMIR) 100 UNIT/ML Pen Inject 30 Units into the skin daily. 18 mL 0   Insulin Pen Needle (PEN NEEDLES 3/16") 31G X 5 MM MISC Use as directed with insulin pen 100 each 0   ipratropium-albuterol (DUONEB) 0.5-2.5 (3) MG/3ML SOLN Take 3 mLs by nebulization every 6 (six) hours as needed. J44.9 and J44.1 360 mL 0   lisinopril (ZESTRIL) 40 MG tablet Take 40 mg by mouth daily.     metFORMIN (GLUCOPHAGE-XR) 500 MG 24 hr tablet Take 1,000 mg by mouth 2 (two) times daily.     pantoprazole (PROTONIX) 40 MG tablet Take 1 tablet (40 mg total) by mouth daily. 30 tablet 11   TRELEGY ELLIPTA 100-62.5-25 MCG/INH AEPB Inhale 1 puff into the lungs daily.     umeclidinium bromide (INCRUSE ELLIPTA) 62.5 MCG/INH AEPB Inhale into the lungs.     vitamin B-12 (CYANOCOBALAMIN) 1000 MCG tablet Take 1 tablet (1,000 mcg total) by mouth daily. 90 tablet 0  No current facility-administered medications for this visit.    SURGICAL HISTORY: No past surgical history on file.  REVIEW OF SYSTEMS:  Constitutional: negative Eyes: negative Ears, nose, mouth, throat, and face: negative Respiratory: positive for cough and dyspnea on exertion Cardiovascular: negative Gastrointestinal: negative Genitourinary:negative Integument/breast: negative Hematologic/lymphatic: negative Musculoskeletal:negative Neurological: negative Behavioral/Psych: negative Endocrine: negative Allergic/Immunologic: negative   PHYSICAL EXAMINATION: General  appearance: alert, cooperative and no distress Head: Normocephalic, without obvious abnormality, atraumatic Neck: no adenopathy, no JVD, supple, symmetrical, trachea midline and thyroid not enlarged, symmetric, no tenderness/mass/nodules Lymph nodes: Cervical, supraclavicular, and axillary nodes normal. Resp: clear to auscultation bilaterally Back: symmetric, no curvature. ROM normal. No CVA tenderness. Cardio: regular rate and rhythm, S1, S2 normal, no murmur, click, rub or gallop GI: soft, non-tender; bowel sounds normal; no masses,  no organomegaly Extremities: extremities normal, atraumatic, no cyanosis or edema Neurologic: Alert and oriented X 3, normal strength and tone. Normal symmetric reflexes. Normal coordination and gait  ECOG PERFORMANCE STATUS: 1 - Symptomatic but completely ambulatory  Blood pressure (!) 164/71, pulse (!) 111, temperature 99.3 F (37.4 C), temperature source Temporal, resp. rate 18, height 5\' 10"  (1.778 m), weight 243 lb 9.6 oz (110.5 kg), SpO2 96 %.  LABORATORY DATA: Lab Results  Component Value Date   WBC 11.3 (H) 12/30/2019   HGB 15.1 12/30/2019   HCT 47.9 12/30/2019   MCV 85.5 12/30/2019   PLT 159 12/30/2019      Chemistry      Component Value Date/Time   NA 132 (L) 12/30/2019 0950   NA 137 07/06/2012 0506   K 5.4 (H) 12/30/2019 0950   K 4.0 07/06/2012 0506   CL 97 (L) 12/30/2019 0950   CL 104 07/06/2012 0506   CO2 24 12/30/2019 0950   CO2 25 07/06/2012 0506   BUN 33 (H) 12/30/2019 0950   BUN 12 07/06/2012 0506   CREATININE 1.42 (H) 12/30/2019 0950   CREATININE 0.62 07/06/2012 0506      Component Value Date/Time   CALCIUM 9.6 12/30/2019 0950   CALCIUM 8.6 07/06/2012 0506   ALKPHOS 111 12/30/2019 0950   ALKPHOS 96 07/06/2012 0506   AST 12 (L) 12/30/2019 0950   ALT 19 12/30/2019 0950   ALT 30 07/06/2012 0506   BILITOT 0.3 12/30/2019 0950       RADIOGRAPHIC STUDIES: CT Chest W Contrast  Result Date: 01/21/2020 CLINICAL DATA:   Primary Cancer Type: Chronic Lymphocytic Leukemia Imaging Indication: Routine surveillance Interval therapy since last imaging? Yes Initial Cancer Diagnosis Date: 2010 Detailed Pathology: CLL with mutated IGVH Recurrence? Yes; Date(s) of recurrence/progression: 08/13/2019; Established by: Biopsy-proven Chemotherapy: No Immunotherapy?  Yes; Type: Obinutuzumab, acalabrutinib; Ongoing? Yes EXAM: CT CHEST, ABDOMEN, AND PELVIS WITH CONTRAST TECHNIQUE: Multidetector CT imaging of the chest, abdomen and pelvis was performed following the standard protocol during bolus administration of intravenous contrast. CONTRAST:  144mL OMNIPAQUE IOHEXOL 300 MG/ML  SOLN COMPARISON:  Most recent CT chest, abdomen and pelvis 11/01/2019. 08/25/2019 PET-CT. FINDINGS: CT CHEST FINDINGS Cardiovascular: Normal heart size. No significant pericardial effusion/thickening. Atherosclerotic nonaneurysmal thoracic aorta. Normal caliber pulmonary arteries. No central pulmonary emboli. Mediastinum/Nodes: Subcentimeter coarsely calcified thyroid isthmus nodules, stable. Not clinically significant; no follow-up imaging recommended (ref: J Am Coll Radiol. 2015 Feb;12(2): 143-50). Unremarkable esophagus. Mildly enlarged bilateral axillary lymph nodes have decreased. Representative 1.2 cm left axillary node (series 2/image 18), previously 1.6 cm on 11/01/2019 CT. Representative 1.1 cm right axillary node (series 2/image 17), previously 1.4 cm. No residual pathologically enlarged mediastinal lymph  nodes. Previously visualized mildly enlarged right paratracheal and paraesophageal lymph nodes have decreased and are now subcentimeter in short axis diameter. Mildly enlarged 1.1 cm right hilar node (series 2/image 27), previously 1.3 cm, slightly decreased. No pathologically enlarged left hilar nodes. Lungs/Pleura: No pneumothorax. No pleural effusion. Diffuse bronchial wall thickening is unchanged. No acute consolidative airspace disease or lung masses. Stable  3 mm apical right upper lobe solid pulmonary nodule (series 4/image 21). No new significant pulmonary nodules. Musculoskeletal: No aggressive appearing focal osseous lesions. Mild thoracic spondylosis. CT ABDOMEN PELVIS FINDINGS Hepatobiliary: Normal liver with no liver mass. Normal gallbladder with no radiopaque cholelithiasis. No biliary ductal dilatation. Pancreas: Normal, with no mass or duct dilation. Spleen: Normal size spleen. Craniocaudal splenic length 11.7 cm, previously 11.4 cm, unchanged. No splenic mass. Adrenals/Urinary Tract: Normal adrenals. Normal kidneys with no hydronephrosis and no renal mass. Normal bladder. Stomach/Bowel: Normal non-distended stomach. Normal caliber small bowel with no small bowel wall thickening. Normal appendix. Oral contrast transits to colon. Normal large bowel with no diverticulosis, large bowel wall thickening or pericolonic fat stranding. Vascular/Lymphatic: Atherosclerotic nonaneurysmal abdominal aorta. Patent portal, splenic, hepatic and renal veins. No pathologically enlarged abdominal lymph nodes. Previously visualized mildly enlarged porta hepatis/portacaval nodes have decreased. For example 0.8 cm porta hepatis node (series 2/image 62), previously 1.2 cm. Previously visualized mild bilateral inguinal and external iliac lymphadenopathy is decreased. Representative 1.0 cm right inguinal node (series 2/image 126), previously 1.5 cm. Representative 0.9 cm right external iliac node (series 2/image 108), previously 1.3 cm. No new pathologically enlarged pelvic nodes. Reproductive: Normal size prostate. Other: No pneumoperitoneum, ascites or focal fluid collection. Musculoskeletal: No aggressive appearing focal osseous lesions. Mild lumbar spondylosis. IMPRESSION: 1. Continued positive response to therapy. Residual mild bilateral axillary, mild right hilar and mild bilateral inguinal lymphadenopathy, all decreased. Spleen is normal size. No new or progressive findings. 2.   Aortic Atherosclerosis (ICD10-I70.0). Electronically Signed   By: Ilona Sorrel M.D.   On: 01/21/2020 10:20   CT Abdomen Pelvis W Contrast  Result Date: 01/21/2020 CLINICAL DATA:  Primary Cancer Type: Chronic Lymphocytic Leukemia Imaging Indication: Routine surveillance Interval therapy since last imaging? Yes Initial Cancer Diagnosis Date: 2010 Detailed Pathology: CLL with mutated IGVH Recurrence? Yes; Date(s) of recurrence/progression: 08/13/2019; Established by: Biopsy-proven Chemotherapy: No Immunotherapy?  Yes; Type: Obinutuzumab, acalabrutinib; Ongoing? Yes EXAM: CT CHEST, ABDOMEN, AND PELVIS WITH CONTRAST TECHNIQUE: Multidetector CT imaging of the chest, abdomen and pelvis was performed following the standard protocol during bolus administration of intravenous contrast. CONTRAST:  169mL OMNIPAQUE IOHEXOL 300 MG/ML  SOLN COMPARISON:  Most recent CT chest, abdomen and pelvis 11/01/2019. 08/25/2019 PET-CT. FINDINGS: CT CHEST FINDINGS Cardiovascular: Normal heart size. No significant pericardial effusion/thickening. Atherosclerotic nonaneurysmal thoracic aorta. Normal caliber pulmonary arteries. No central pulmonary emboli. Mediastinum/Nodes: Subcentimeter coarsely calcified thyroid isthmus nodules, stable. Not clinically significant; no follow-up imaging recommended (ref: J Am Coll Radiol. 2015 Feb;12(2): 143-50). Unremarkable esophagus. Mildly enlarged bilateral axillary lymph nodes have decreased. Representative 1.2 cm left axillary node (series 2/image 18), previously 1.6 cm on 11/01/2019 CT. Representative 1.1 cm right axillary node (series 2/image 17), previously 1.4 cm. No residual pathologically enlarged mediastinal lymph nodes. Previously visualized mildly enlarged right paratracheal and paraesophageal lymph nodes have decreased and are now subcentimeter in short axis diameter. Mildly enlarged 1.1 cm right hilar node (series 2/image 27), previously 1.3 cm, slightly decreased. No pathologically  enlarged left hilar nodes. Lungs/Pleura: No pneumothorax. No pleural effusion. Diffuse bronchial wall thickening is unchanged. No acute consolidative airspace  disease or lung masses. Stable 3 mm apical right upper lobe solid pulmonary nodule (series 4/image 21). No new significant pulmonary nodules. Musculoskeletal: No aggressive appearing focal osseous lesions. Mild thoracic spondylosis. CT ABDOMEN PELVIS FINDINGS Hepatobiliary: Normal liver with no liver mass. Normal gallbladder with no radiopaque cholelithiasis. No biliary ductal dilatation. Pancreas: Normal, with no mass or duct dilation. Spleen: Normal size spleen. Craniocaudal splenic length 11.7 cm, previously 11.4 cm, unchanged. No splenic mass. Adrenals/Urinary Tract: Normal adrenals. Normal kidneys with no hydronephrosis and no renal mass. Normal bladder. Stomach/Bowel: Normal non-distended stomach. Normal caliber small bowel with no small bowel wall thickening. Normal appendix. Oral contrast transits to colon. Normal large bowel with no diverticulosis, large bowel wall thickening or pericolonic fat stranding. Vascular/Lymphatic: Atherosclerotic nonaneurysmal abdominal aorta. Patent portal, splenic, hepatic and renal veins. No pathologically enlarged abdominal lymph nodes. Previously visualized mildly enlarged porta hepatis/portacaval nodes have decreased. For example 0.8 cm porta hepatis node (series 2/image 62), previously 1.2 cm. Previously visualized mild bilateral inguinal and external iliac lymphadenopathy is decreased. Representative 1.0 cm right inguinal node (series 2/image 126), previously 1.5 cm. Representative 0.9 cm right external iliac node (series 2/image 108), previously 1.3 cm. No new pathologically enlarged pelvic nodes. Reproductive: Normal size prostate. Other: No pneumoperitoneum, ascites or focal fluid collection. Musculoskeletal: No aggressive appearing focal osseous lesions. Mild lumbar spondylosis. IMPRESSION: 1. Continued positive  response to therapy. Residual mild bilateral axillary, mild right hilar and mild bilateral inguinal lymphadenopathy, all decreased. Spleen is normal size. No new or progressive findings. 2.  Aortic Atherosclerosis (ICD10-I70.0). Electronically Signed   By: Ilona Sorrel M.D.   On: 01/21/2020 10:20    ASSESSMENT AND PLAN: This is a very pleasant 49 years old white male with CLL/small lymphocytic lymphoma diagnosed in 2010 and has been in observation for several years but the patient had significant disease progression and August 2020. He is currently undergoing treatment with obinutuzumab every 4 weeks in addition to acalabrutinib 100 mg p.o. twice daily.  Status post 6 cycles. The patient has been tolerating his treatment well with no concerning adverse effects. He had repeat CT scan of the chest, abdomen pelvis performed recently.  I personally and independently reviewed the scans and discussed the results with the patient today. His scan showed no concerning findings for disease progression and he continues to have improvement of his disease. I recommended for the patient to continue his current treatment with obinutuzumab and acalabrutinib and he will proceed with cycle #7 today. I will see him back for follow-up visit in 4 weeks for evaluation before the next cycle of his treatment. For the hypertension, he was advised to monitor his blood pressure closely and take his medication as prescribed. The patient was advised to call immediately if he has any other concerning symptoms in the interval.  The patient voices understanding of current disease status and treatment options and is in agreement with the current care plan.  All questions were answered. The patient knows to call the clinic with any problems, questions or concerns. We can certainly see the patient much sooner if necessary.   Disclaimer: This note was dictated with voice recognition software. Similar sounding words can inadvertently  be transcribed and may not be corrected upon review.

## 2020-01-27 NOTE — Patient Instructions (Signed)
[facility] Discharge Instructions for Patients Receiving Chemotherapy  Today you received the following chemotherapy agents Obinutuzumab (GAZYVA).  To help prevent nausea and vomiting after your treatment, we encourage you to take your nausea medication as prescribed.   If you develop nausea and vomiting that is not controlled by your nausea medication, call the clinic.   BELOW ARE SYMPTOMS THAT SHOULD BE REPORTED IMMEDIATELY:  *FEVER GREATER THAN 100.5 F  *CHILLS WITH OR WITHOUT FEVER  NAUSEA AND VOMITING THAT IS NOT CONTROLLED WITH YOUR NAUSEA MEDICATION  *UNUSUAL SHORTNESS OF BREATH  *UNUSUAL BRUISING OR BLEEDING  TENDERNESS IN MOUTH AND THROAT WITH OR WITHOUT PRESENCE OF ULCERS  *URINARY PROBLEMS  *BOWEL PROBLEMS  UNUSUAL RASH Items with * indicate a potential emergency and should be followed up as soon as possible.  Feel free to call the clinic should you have any questions or concerns. The clinic phone number is [phone].  Please show the CHEMO ALERT CARD at check-in to the Emergency Department and triage nurse.   

## 2020-01-27 NOTE — Progress Notes (Signed)
15 units Regular Insulin administered at 0930 for BG of 546.  Repeat CBG at 1040 is 416, desk RN Diane informed.

## 2020-01-27 NOTE — Progress Notes (Signed)
Per Dr. Mohamed, okay to treat with elevated HR. 

## 2020-01-28 ENCOUNTER — Telehealth: Payer: Self-pay | Admitting: Internal Medicine

## 2020-01-28 NOTE — Telephone Encounter (Signed)
Scheduled per los. Called and left msg. Mailed printout  °

## 2020-02-03 ENCOUNTER — Other Ambulatory Visit: Payer: Self-pay | Admitting: Internal Medicine

## 2020-02-03 DIAGNOSIS — C9110 Chronic lymphocytic leukemia of B-cell type not having achieved remission: Secondary | ICD-10-CM

## 2020-02-24 ENCOUNTER — Other Ambulatory Visit: Payer: Self-pay

## 2020-02-24 ENCOUNTER — Inpatient Hospital Stay: Payer: Medicare Other

## 2020-02-24 ENCOUNTER — Other Ambulatory Visit: Payer: Self-pay | Admitting: Physician Assistant

## 2020-02-24 ENCOUNTER — Inpatient Hospital Stay: Payer: Medicare Other | Attending: Physician Assistant | Admitting: Internal Medicine

## 2020-02-24 ENCOUNTER — Encounter: Payer: Self-pay | Admitting: Internal Medicine

## 2020-02-24 VITALS — BP 117/67 | HR 94 | Temp 99.2°F | Resp 18

## 2020-02-24 VITALS — BP 159/66 | HR 124 | Temp 97.9°F | Resp 18 | Ht 70.0 in | Wt 236.8 lb

## 2020-02-24 DIAGNOSIS — J449 Chronic obstructive pulmonary disease, unspecified: Secondary | ICD-10-CM | POA: Insufficient documentation

## 2020-02-24 DIAGNOSIS — Z794 Long term (current) use of insulin: Secondary | ICD-10-CM | POA: Insufficient documentation

## 2020-02-24 DIAGNOSIS — E119 Type 2 diabetes mellitus without complications: Secondary | ICD-10-CM | POA: Insufficient documentation

## 2020-02-24 DIAGNOSIS — F17210 Nicotine dependence, cigarettes, uncomplicated: Secondary | ICD-10-CM | POA: Insufficient documentation

## 2020-02-24 DIAGNOSIS — C9110 Chronic lymphocytic leukemia of B-cell type not having achieved remission: Secondary | ICD-10-CM | POA: Insufficient documentation

## 2020-02-24 DIAGNOSIS — Z5112 Encounter for antineoplastic immunotherapy: Secondary | ICD-10-CM | POA: Diagnosis present

## 2020-02-24 DIAGNOSIS — Z5111 Encounter for antineoplastic chemotherapy: Secondary | ICD-10-CM | POA: Diagnosis not present

## 2020-02-24 DIAGNOSIS — Z7982 Long term (current) use of aspirin: Secondary | ICD-10-CM | POA: Diagnosis not present

## 2020-02-24 DIAGNOSIS — I10 Essential (primary) hypertension: Secondary | ICD-10-CM | POA: Diagnosis not present

## 2020-02-24 DIAGNOSIS — Z7951 Long term (current) use of inhaled steroids: Secondary | ICD-10-CM | POA: Insufficient documentation

## 2020-02-24 DIAGNOSIS — R000 Tachycardia, unspecified: Secondary | ICD-10-CM | POA: Insufficient documentation

## 2020-02-24 DIAGNOSIS — Z79899 Other long term (current) drug therapy: Secondary | ICD-10-CM | POA: Diagnosis not present

## 2020-02-24 DIAGNOSIS — E1165 Type 2 diabetes mellitus with hyperglycemia: Secondary | ICD-10-CM

## 2020-02-24 LAB — CBC WITH DIFFERENTIAL (CANCER CENTER ONLY)
Abs Immature Granulocytes: 0.17 10*3/uL — ABNORMAL HIGH (ref 0.00–0.07)
Basophils Absolute: 0.1 10*3/uL (ref 0.0–0.1)
Basophils Relative: 1 %
Eosinophils Absolute: 0.2 10*3/uL (ref 0.0–0.5)
Eosinophils Relative: 1 %
HCT: 42 % (ref 39.0–52.0)
Hemoglobin: 13.7 g/dL (ref 13.0–17.0)
Immature Granulocytes: 1 %
Lymphocytes Relative: 16 %
Lymphs Abs: 1.8 10*3/uL (ref 0.7–4.0)
MCH: 27.8 pg (ref 26.0–34.0)
MCHC: 32.6 g/dL (ref 30.0–36.0)
MCV: 85.4 fL (ref 80.0–100.0)
Monocytes Absolute: 1.2 10*3/uL — ABNORMAL HIGH (ref 0.1–1.0)
Monocytes Relative: 10 %
Neutro Abs: 8.3 10*3/uL — ABNORMAL HIGH (ref 1.7–7.7)
Neutrophils Relative %: 71 %
Platelet Count: 220 10*3/uL (ref 150–400)
RBC: 4.92 MIL/uL (ref 4.22–5.81)
RDW: 15.3 % (ref 11.5–15.5)
WBC Count: 11.7 10*3/uL — ABNORMAL HIGH (ref 4.0–10.5)
nRBC: 0 % (ref 0.0–0.2)

## 2020-02-24 LAB — GLUCOSE, CAPILLARY: Glucose-Capillary: 433 mg/dL — ABNORMAL HIGH (ref 70–99)

## 2020-02-24 LAB — CMP (CANCER CENTER ONLY)
ALT: 15 U/L (ref 0–44)
AST: 13 U/L — ABNORMAL LOW (ref 15–41)
Albumin: 3.5 g/dL (ref 3.5–5.0)
Alkaline Phosphatase: 98 U/L (ref 38–126)
Anion gap: 14 (ref 5–15)
BUN: 17 mg/dL (ref 6–20)
CO2: 24 mmol/L (ref 22–32)
Calcium: 10.1 mg/dL (ref 8.9–10.3)
Chloride: 96 mmol/L — ABNORMAL LOW (ref 98–111)
Creatinine: 1.55 mg/dL — ABNORMAL HIGH (ref 0.61–1.24)
GFR, Est AFR Am: 60 mL/min — ABNORMAL LOW (ref >60)
GFR, Estimated: 51 mL/min — ABNORMAL LOW (ref >60)
Glucose, Bld: 440 mg/dL — ABNORMAL HIGH (ref 70–99)
Potassium: 4.4 mmol/L (ref 3.5–5.1)
Sodium: 134 mmol/L — ABNORMAL LOW (ref 135–145)
Total Bilirubin: 0.4 mg/dL (ref 0.3–1.2)
Total Protein: 7.3 g/dL (ref 6.5–8.1)

## 2020-02-24 LAB — URIC ACID: Uric Acid, Serum: 5.4 mg/dL (ref 3.7–8.6)

## 2020-02-24 LAB — LACTATE DEHYDROGENASE: LDH: 186 U/L (ref 98–192)

## 2020-02-24 MED ORDER — SODIUM CHLORIDE 0.9 % IV SOLN
Freq: Once | INTRAVENOUS | Status: AC
  Filled 2020-02-24: qty 250

## 2020-02-24 MED ORDER — INSULIN REGULAR HUMAN 100 UNIT/ML IJ SOLN
INTRAMUSCULAR | Status: AC
  Filled 2020-02-24: qty 1

## 2020-02-24 MED ORDER — DIPHENHYDRAMINE HCL 50 MG/ML IJ SOLN
25.0000 mg | Freq: Once | INTRAMUSCULAR | Status: AC
  Administered 2020-02-24: 25 mg via INTRAVENOUS

## 2020-02-24 MED ORDER — ACETAMINOPHEN 325 MG PO TABS
ORAL_TABLET | ORAL | Status: AC
  Filled 2020-02-24: qty 2

## 2020-02-24 MED ORDER — ACETAMINOPHEN 325 MG PO TABS
650.0000 mg | ORAL_TABLET | Freq: Once | ORAL | Status: AC
  Administered 2020-02-24: 650 mg via ORAL

## 2020-02-24 MED ORDER — SODIUM CHLORIDE 0.9 % IV SOLN
1000.0000 mg | Freq: Once | INTRAVENOUS | Status: AC
  Administered 2020-02-24: 1000 mg via INTRAVENOUS
  Filled 2020-02-24: qty 40

## 2020-02-24 MED ORDER — INSULIN REGULAR HUMAN 100 UNIT/ML IJ SOLN
15.0000 [IU] | Freq: Once | INTRAMUSCULAR | Status: AC
  Administered 2020-02-24: 15 [IU] via SUBCUTANEOUS

## 2020-02-24 MED ORDER — SODIUM CHLORIDE 0.9 % IV SOLN
10.0000 mg | Freq: Once | INTRAVENOUS | Status: AC
  Administered 2020-02-24: 10 mg via INTRAVENOUS
  Filled 2020-02-24: qty 10

## 2020-02-24 MED ORDER — DIPHENHYDRAMINE HCL 50 MG/ML IJ SOLN
INTRAMUSCULAR | Status: AC
  Filled 2020-02-24: qty 1

## 2020-02-24 NOTE — Progress Notes (Signed)
At end of treatment, repeat glucometer was 433 mg/dL. Cassie Heilingoetter, PA-C notified. Advised that Dr. Julien Nordmann did not want to give additional insulin. Also advised to have patient follow up with PMD for DM management. Jonnie Finner, RN informed patient of Dr. Worthy Flank request.

## 2020-02-24 NOTE — Progress Notes (Signed)
[place] [phone]   Fax:([phone]  OFFICE PROGRESS NOTE  [place] [id number]  DIAGNOSIS: CLL/small lymphocytic lymphoma diagnosed in 2010 and has been in observation for several years with no treatment. 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 The cytogenetics were negative for P 17.   PRIOR THERAPY: None  CURRENT THERAPY: Obinutuzumab 1,000 mg IV. First dose on 08/10/2019. He will also begin acalabrutinib 100 mg BID starting on 08/19/2019.  Status post 7 cycles.  INTERVAL HISTORY: Larry Lam 49 y.o. male returns to the clinic today for follow-up visit.  The patient is feeling fine today with no concerning complaints except for shortness of breath with exertion.  Unfortunately continues to smoke at regular basis and I strongly advised him to quit smoking.  He denied having any current chest pain, cough or hemoptysis.  He denied having any fever or chills.  He has no nausea, vomiting, diarrhea or constipation.  He lost few pounds since his last visit.  He continues to tolerate his chemotherapy fairly well.  MEDICAL HISTORY: Past Medical History:  Diagnosis Date  . Asthma   . cll dx'd 04/2019  . COPD (chronic obstructive pulmonary disease) ([place])   . Diabetes mellitus without complication ([place])   . Hypertension   . Seizures ([place])    3-4 years ago. Single episode.  . Tobacco use     ALLERGIES:  is allergic to amlodipine besylate-valsartan and hydrochlorothiazide.  MEDICATIONS:  Current Outpatient Medications  Medication Sig Dispense Refill  . albuterol (PROVENTIL HFA;VENTOLIN HFA) 108 (90 Base) MCG/ACT inhaler Inhale 2 puffs into the lungs every 6 (six) hours as needed for wheezing or shortness of breath. 1 Inhaler 0  . allopurinol (ZYLOPRIM) 100 MG tablet Take 1 tablet (100 mg total) by mouth 2 (two) times daily. 60 tablet 0  . ASPIRIN 81  PO Take 81 mg by mouth.    Marland Kitchen atorvastatin (LIPITOR) 10 MG tablet Take 10 mg by mouth daily.    Marland Kitchen CALQUENCE 100 MG capsule TAKE 1 CAPSULE (100 MG TOTAL) BY MOUTH 2 (TWO) TIMES DAILY. 60 capsule 2  . escitalopram (LEXAPRO) 20 MG tablet Take 20 mg by mouth daily.    . Fluticasone-Salmeterol (ADVAIR) 250-50 MCG/DOSE AEPB Inhale into the lungs.    . Insulin Detemir (LEVEMIR) 100 UNIT/ML Pen Inject 30 Units into the skin daily. 18 mL 0  . Insulin Pen Needle (PEN NEEDLES 3/16") 31G X 5 MM MISC Use as directed with insulin pen 100 each 0  . ipratropium-albuterol (DUONEB) 0.5-2.5 (3) MG/3ML SOLN Take 3 mLs by nebulization every 6 (six) hours as needed. J44.9 and J44.1 360 mL 0  . lisinopril (ZESTRIL) 40 MG tablet Take 40 mg by mouth daily.    . metFORMIN (GLUCOPHAGE-XR) 500 MG 24 hr tablet Take 1,000 mg by mouth 2 (two) times daily.    . pantoprazole (PROTONIX) 40 MG tablet Take 1 tablet (40 mg total) by mouth daily. 30 tablet 11  . TRELEGY ELLIPTA 100-62.5-25 MCG/INH AEPB Inhale 1 puff into the lungs daily.    Marland Kitchen umeclidinium bromide (INCRUSE ELLIPTA) 62.5 MCG/INH AEPB Inhale into the lungs.    . vitamin B-12 (CYANOCOBALAMIN) 1000 MCG tablet Take 1 tablet (1,000 mcg total) by mouth daily. 90 tablet 0   No current facility-administered medications for this visit.    SURGICAL HISTORY: No past surgical history on file.  REVIEW OF SYSTEMS:  A comprehensive review of systems was negative except for: Constitutional: positive for fatigue Respiratory: positive for dyspnea on exertion   PHYSICAL EXAMINATION: General appearance: alert, cooperative, fatigued and no distress Head: Normocephalic, without obvious abnormality, atraumatic Neck: no adenopathy, no JVD, supple, symmetrical, trachea midline and thyroid not enlarged, symmetric, no tenderness/mass/nodules Lymph nodes: Cervical, supraclavicular, and axillary nodes normal. Resp: clear to auscultation bilaterally Back: symmetric, no curvature. ROM normal.  No CVA tenderness. Cardio: regular rate and rhythm, S1, S2 normal, no murmur, click, rub or gallop GI: soft, non-tender; bowel sounds normal; no masses,  no organomegaly Extremities: extremities normal, atraumatic, no cyanosis or edema  ECOG PERFORMANCE STATUS: 1 - Symptomatic but completely ambulatory  Blood pressure (!) 159/66, pulse (!) 124, temperature 97.9 F (36.6 C), temperature source Temporal, resp. rate 18, height 5\' 10"  (1.778 m), weight 236 lb 12.8 oz (107.4 kg), SpO2 94 %.  LABORATORY DATA: Lab Results  Component Value Date   WBC 11.7 (H) 02/24/2020   HGB 13.7 02/24/2020   HCT 42.0 02/24/2020   MCV 85.4 02/24/2020   PLT 220 02/24/2020      Chemistry      Component Value Date/Time   NA 132 (L) 01/27/2020 0819   NA 137 07/06/2012 0506   K 4.7 01/27/2020 0819   K 4.0 07/06/2012 0506   CL 96 (L) 01/27/2020 0819   CL 104 07/06/2012 0506   CO2 21 (L) 01/27/2020 0819   CO2 25 07/06/2012 0506   BUN 17 01/27/2020 0819   BUN 12 07/06/2012 0506   CREATININE 1.64 (H) 01/27/2020 0819   CREATININE 0.62 07/06/2012 0506      Component Value Date/Time   CALCIUM 9.6 01/27/2020 0819   CALCIUM 8.6 07/06/2012 0506   ALKPHOS 118 01/27/2020 0819   ALKPHOS 96 07/06/2012 0506   AST 15 01/27/2020 0819   ALT 23 01/27/2020 0819   ALT 30 07/06/2012 0506   BILITOT 0.4 01/27/2020 0819       RADIOGRAPHIC STUDIES: No results found.  ASSESSMENT AND PLAN: This is a very pleasant 49 years old white male with CLL/small lymphocytic lymphoma diagnosed in 2010 and has been in observation for several years but the patient had significant disease progression and August 2020. He is currently undergoing treatment with obinutuzumab every 4 weeks in addition to acalabrutinib 100 mg p.o. twice daily.  Status post 7 cycles. The patient has been tolerating this treatment well with no concerning adverse effects. I recommended for him to proceed with cycle #8 today as planned. He will come back for  follow-up visit in 4 weeks for evaluation before the next cycle of his treatment. For the tachycardia, the patient has sinus tachycardia and advised him to increase his oral hydration. The patient was advised to call immediately if he has any other concerning symptoms in the interval.  The patient voices understanding of current disease status and treatment options and is in agreement with the current care plan.  All questions were answered. The patient knows to call the clinic with any problems, questions or concerns. We can certainly see the patient much sooner if necessary.   Disclaimer: This note was dictated with voice recognition software. Similar sounding words can inadvertently be transcribed and may not be corrected upon review.

## 2020-02-24 NOTE — Patient Instructions (Signed)
[facility] Discharge Instructions for Patients Receiving Chemotherapy  Today you received the following chemotherapy agents: Gazyva   To help prevent nausea and vomiting after your treatment, we encourage you to take your nausea medication as directed.    If you develop nausea and vomiting that is not controlled by your nausea medication, call the clinic.   BELOW ARE SYMPTOMS THAT SHOULD BE REPORTED IMMEDIATELY:  *FEVER GREATER THAN 100.5 F  *CHILLS WITH OR WITHOUT FEVER  NAUSEA AND VOMITING THAT IS NOT CONTROLLED WITH YOUR NAUSEA MEDICATION  *UNUSUAL SHORTNESS OF BREATH  *UNUSUAL BRUISING OR BLEEDING  TENDERNESS IN MOUTH AND THROAT WITH OR WITHOUT PRESENCE OF ULCERS  *URINARY PROBLEMS  *BOWEL PROBLEMS  UNUSUAL RASH Items with * indicate a potential emergency and should be followed up as soon as possible.  Feel free to call the clinic should you have any questions or concerns. The clinic phone number is [phone].  Please show the CHEMO ALERT CARD at check-in to the Emergency Department and triage nurse.   

## 2020-02-24 NOTE — Progress Notes (Signed)
OK to treat-Per Dr Julien Nordmann , it is okay to treat pt today with Obinutuzumab and heart rate of 124/min.

## 2020-03-22 NOTE — Progress Notes (Signed)
[facility] OFFICE PROGRESS NOTE  Larry Coffin, MD [place] [address]  DIAGNOSIS: CLL/small lymphocytic lymphoma diagnosed in 2010 and has been in observation for several years with no treatment. 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 The cytogenetics were negative for P 17.  PRIOR THERAPY: None  CURRENT THERAPY: Obinutuzumab 1,000 mg IV. First dose on 08/10/2019. He will also begin acalabrutinib 100 mg BID starting on 08/19/2019. Status post 8cycles.   INTERVAL HISTORY: Larry Lam 49 y.o. male returns to the clinic today for a follow-up visit.  The patient reports increased shortness of breath with minimal exertion, cough, and increased work of breathing over the last 2 weeks or so.  The patient reports that approximately 3 weeks ago or so, he decided to quit smoking because he was tired of coughing. He states that he would produce dark brown sputum with his cough. More recently, he reports his sputum is clear. The patient also notes progressively worsening dyspnea on exertion with mild activity which has worsened over the last 2 weeks or so. He states he was short of breath with minimal activity getting ready this morning. The patient denies any recent sick contacts.  He denies any fever or night sweats but reports chills.  He denies sore throat.  He reports left lateral lower rib cage pain/left upper quadrant pain which is worse with coughing or laying on his left side.  Denies any calf pain or swelling. Patient denies history of heart failure.  In December 2020, the patient was admitted to the hospital/ICU for acute on chronic respiratory failure with hypoxia and hypercapnia and pneumonia.  The patient carries diagnosis of COPD.  On 08/07/2019 the patient had respiratory/cardiac arrest and was subsequently intubated and transferred to [facility] (after being found without his nasal  cannula on). Sputum culture returned back Stenotropomonas and MSSA pneumonia.  He was treated with antibiotics accordingly for multifocal pneumonia.  He was discharged from the hospital with inhalers.  He also was discharged with supplemental oxygen at 2 L.  Currently, patient does not wear his oxygen except for at night because he did not feel that he needed it. He also ran out of his albuterol and was requesting a refill today.  Otherwise, today the patient denies any diarrhea, constipation, or vomiting.  He does report nausea.  He also lost approximately 5 pounds since his last appointment secondary to decreased appetite. Denies noticeable abnormal bloating or early satiety. He denies any peripheral neuropathy.  He denies any new  lymphadenopathy.  He was scheduled for evaluation, repeat labs, and to start cycle #9 of his treatment today.    MEDICAL HISTORY: Past Medical History:  Diagnosis Date  . Asthma   . cll dx'd 04/2019  . COPD (chronic obstructive pulmonary disease) ([place])   . Diabetes mellitus without complication ([place])   . Hypertension   . Seizures ([place])    3-4 years ago. Single episode.  . Tobacco use     ALLERGIES:  is allergic to amlodipine besylate-valsartan and hydrochlorothiazide.  MEDICATIONS:  Current Outpatient Medications  Medication Sig Dispense Refill  . albuterol (PROVENTIL HFA;VENTOLIN HFA) 108 (90 Base) MCG/ACT inhaler Inhale 2 puffs into the lungs every 6 (six) hours as needed for wheezing or shortness of breath. 1 Inhaler 0  . ASPIRIN 81 PO Take 81 mg by mouth.    Marland Kitchen atorvastatin (LIPITOR) 10 MG tablet Take 10 mg by  mouth daily.    Marland Kitchen CALQUENCE 100 MG capsule TAKE 1 CAPSULE (100 MG TOTAL) BY MOUTH 2 (TWO) TIMES DAILY. 60 capsule 2  . escitalopram (LEXAPRO) 20 MG tablet Take 20 mg by mouth daily.    . Fluticasone-Salmeterol (ADVAIR) 250-50 MCG/DOSE AEPB Inhale into the lungs.    . Insulin Pen Needle (PEN NEEDLES 3/16") 31G X 5 MM MISC Use as directed with insulin  pen 100 each 0  . ipratropium-albuterol (DUONEB) 0.5-2.5 (3) MG/3ML SOLN Take 3 mLs by nebulization every 6 (six) hours as needed. J44.9 and J44.1 360 mL 0  . lisinopril (ZESTRIL) 40 MG tablet Take 40 mg by mouth daily.    . metFORMIN (GLUCOPHAGE-XR) 500 MG 24 hr tablet Take 1,000 mg by mouth 2 (two) times daily.    . pantoprazole (PROTONIX) 40 MG tablet Take 1 tablet (40 mg total) by mouth daily. 30 tablet 11  . TRELEGY ELLIPTA 100-62.5-25 MCG/INH AEPB Inhale 1 puff into the lungs daily.    Marland Kitchen umeclidinium bromide (INCRUSE ELLIPTA) 62.5 MCG/INH AEPB Inhale into the lungs.    . vitamin B-12 (CYANOCOBALAMIN) 1000 MCG tablet Take 1 tablet (1,000 mcg total) by mouth daily. 90 tablet 0  . allopurinol (ZYLOPRIM) 100 MG tablet Take 1 tablet (100 mg total) by mouth 2 (two) times daily. 60 tablet 0  . Insulin Detemir (LEVEMIR) 100 UNIT/ML Pen Inject 30 Units into the skin daily. (Patient taking differently: Inject 40 Units into the skin daily. ) 18 mL 0   No current facility-administered medications for this visit.    SURGICAL HISTORY: No past surgical history on file.  REVIEW OF SYSTEMS:   Review of Systems  Constitutional: Positive for fatigue, chills, appetite change, and weight loss. Negative for fever.  HENT: Negative for mouth sores, nosebleeds, sore throat and trouble swallowing.   Eyes: Negative for eye problems and icterus.  Respiratory: Positive for productive cough and shortness of breath.  Cardiovascular: Positive for lateral left lower rib pain with coughing. Negative for leg swelling.  Gastrointestinal: Positive for nausea. Negative for abdominal pain, constipation, diarrhea,  and vomiting.  Genitourinary: Negative for bladder incontinence, difficulty urinating, dysuria, frequency and hematuria.   Musculoskeletal: Negative for back pain, gait problem, neck pain and neck stiffness.  Skin: Negative for itching and rash.  Neurological: Negative for dizziness, extremity weakness, gait  problem, headaches, light-headedness and seizures.  Hematological: Negative for adenopathy. Does not bruise/bleed easily.  Psychiatric/Behavioral: Negative for confusion, depression and sleep disturbance. The patient is not nervous/anxious.     PHYSICAL EXAMINATION:  Blood pressure (!) 136/58, pulse (!) 125, temperature 98.1 F (36.7 C), temperature source Temporal, resp. rate 20, height 5\' 10"  (1.778 m), weight (!) 231 lb 6.4 oz (105 kg), SpO2 92 %.  ECOG PERFORMANCE STATUS: 1 - Symptomatic but completely ambulatory  Physical Exam  Constitutional: Oriented to person, place, and time and well-developed, well-nourished, and in no distress.  HENT:  Head: Normocephalic and atraumatic.  Mouth/Throat: Oropharynx is clear and moist. No oropharyngeal exudate.  Eyes: Conjunctivae are normal. Right eye exhibits no discharge. Left eye exhibits no discharge. No scleral icterus.  Neck: Normal range of motion. Neck supple.  Cardiovascular: tachycardic, regular rhythm, normal heart sounds and intact distal pulses.  No tenderness to palpation of his chest wall.  Pulmonary/Chest: Effort normal. Quiet breath sounds with some wheezing. Crackles noted at left lung base. On 3 L of oxygen.  Abdominal: Soft. Bowel sounds are normal. Exhibits no distension and no mass. There is no  tenderness.  Musculoskeletal: Normal range of motion. Exhibits no edema.  Lymphadenopathy:    No cervical adenopathy.  Neurological: Alert and oriented to person, place, and time. Exhibits normal muscle tone. Gait normal. Coordination normal.  Skin: Skin is warm and dry. No rash noted. Not diaphoretic. No erythema. No pallor.  Psychiatric: Mood, memory and judgment normal.  Vitals reviewed.  LABORATORY DATA: Lab Results  Component Value Date   WBC 25.7 (H) 03/23/2020   HGB 10.6 (L) 03/23/2020   HCT 34.6 (L) 03/23/2020   MCV 87.4 03/23/2020   PLT 279 03/23/2020      Chemistry      Component Value Date/Time   NA 134 (L)  03/23/2020 0832   NA 137 07/06/2012 0506   K 5.2 (H) 03/23/2020 0832   K 4.0 07/06/2012 0506   CL 98 03/23/2020 0832   CL 104 07/06/2012 0506   CO2 21 (L) 03/23/2020 0832   CO2 25 07/06/2012 0506   BUN 29 (H) 03/23/2020 0832   BUN 12 07/06/2012 0506   CREATININE 2.33 (H) 03/23/2020 0832   CREATININE 0.62 07/06/2012 0506      Component Value Date/Time   CALCIUM 10.6 (H) 03/23/2020 0832   CALCIUM 8.6 07/06/2012 0506   ALKPHOS 78 03/23/2020 0832   ALKPHOS 96 07/06/2012 0506   AST 14 (L) 03/23/2020 0832   ALT 15 03/23/2020 0832   ALT 30 07/06/2012 0506   BILITOT 0.6 03/23/2020 0832       RADIOGRAPHIC STUDIES:  No results found.   ASSESSMENT/PLAN:  This is a very pleasant 49 year old Caucasian male diagnosed withCLL/small lymphocytic lymphoma diagnosed in 2010 and has been in observation for several years with no treatment. 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 The cytogenetics were negative for P 17.  The patient is currently undergoing treatment withObinutuzumab 1,000 mg IV. He is status post day cycle #8. He tolerated well without any adverse side effects. He has been on acalabrutinib 100 mg BID starting on 08/19/2019  The patient was seen with Dr. Julien Nordmann today. The patient's labs demonstrate creatinine of 2.33, elevated uric acid at 9.6, elevated WBC count at 25.7. He is tachycardic with a pulse of 125. Oxygen 87% now at 92% with 3 L of oxygen. Given his vitals, labs, and increased shortness of breath, the patient will be sent to the emergency room for further evaluation and management of his condition.   He was instructed to hold his chemotherapy for now until improvement in his current condition.   We will arrange outpatient follow up once he is discharged.   The patient was advised to call immediately if he has any concerning symptoms in the interval. The patient voices understanding of current disease status and  treatment options and is in agreement with the current care plan. All questions were answered. The patient knows to call the clinic with any problems, questions or concerns. We can certainly see the patient much sooner if necessary  No orders of the defined types were placed in this encounter.    Larry Lam L Octavis Sheeler, PA-C 03/23/20   ADDENDUM: Hematology/Oncology Attending: I had a face-to-face encounter with the patient today.  I recommended his care plan.  This is a very pleasant 49 years old white male with history of CLL/small lymphocytic lymphoma diagnosed in 2010.  The patient is started treatment with obinutuzumab and acalabrutinib in December 2020.  He has been tolerating this treatment well with no concerning adverse effects. He  presented to the clinic today complaining of increasing fatigue and weakness as well as shortness of breath and tachycardia.  His oxygen saturation was 87% improved with 3 L nasal cannula to 92%.   I recommended for the patient to hold his treatment with obinutuzumab today.  We will send the patient to the emergency department for further evaluation and to rule out any pneumonia or congestive heart failure. We will arrange his follow-up visit and treatment after discharge. He was advised to call immediately if he has any other concerning symptoms in the interval.  Disclaimer: This note was dictated with voice recognition software. Similar sounding words can inadvertently be transcribed and may be missed upon review. Eilleen Kempf, MD 03/23/20

## 2020-03-23 ENCOUNTER — Inpatient Hospital Stay (HOSPITAL_BASED_OUTPATIENT_CLINIC_OR_DEPARTMENT_OTHER): Payer: Medicare Other | Admitting: Physician Assistant

## 2020-03-23 ENCOUNTER — Inpatient Hospital Stay: Payer: Medicare Other

## 2020-03-23 ENCOUNTER — Encounter: Payer: Self-pay | Admitting: Physician Assistant

## 2020-03-23 ENCOUNTER — Emergency Department (HOSPITAL_COMMUNITY): Payer: Medicare Other

## 2020-03-23 ENCOUNTER — Other Ambulatory Visit: Payer: Self-pay

## 2020-03-23 ENCOUNTER — Encounter (HOSPITAL_COMMUNITY): Payer: Self-pay | Admitting: Emergency Medicine

## 2020-03-23 ENCOUNTER — Inpatient Hospital Stay (HOSPITAL_COMMUNITY)
Admission: EM | Admit: 2020-03-23 | Discharge: 2020-03-26 | DRG: 193 | Disposition: A | Discharge status: Home / Self Care | Payer: Medicare Other | Source: Ambulatory Visit | Attending: Internal Medicine | Admitting: Internal Medicine

## 2020-03-23 VITALS — BP 136/58 | HR 125 | Temp 98.1°F | Resp 20 | Ht 70.0 in | Wt 231.4 lb

## 2020-03-23 DIAGNOSIS — E538 Deficiency of other specified B group vitamins: Secondary | ICD-10-CM | POA: Diagnosis present

## 2020-03-23 DIAGNOSIS — Z7982 Long term (current) use of aspirin: Secondary | ICD-10-CM

## 2020-03-23 DIAGNOSIS — Z79899 Other long term (current) drug therapy: Secondary | ICD-10-CM

## 2020-03-23 DIAGNOSIS — T464X5A Adverse effect of angiotensin-converting-enzyme inhibitors, initial encounter: Secondary | ICD-10-CM | POA: Diagnosis present

## 2020-03-23 DIAGNOSIS — J449 Chronic obstructive pulmonary disease, unspecified: Secondary | ICD-10-CM | POA: Diagnosis not present

## 2020-03-23 DIAGNOSIS — Z20822 Contact with and (suspected) exposure to covid-19: Secondary | ICD-10-CM | POA: Diagnosis present

## 2020-03-23 DIAGNOSIS — N179 Acute kidney failure, unspecified: Secondary | ICD-10-CM | POA: Diagnosis present

## 2020-03-23 DIAGNOSIS — N1831 Chronic kidney disease, stage 3a: Secondary | ICD-10-CM | POA: Diagnosis present

## 2020-03-23 DIAGNOSIS — Z9981 Dependence on supplemental oxygen: Secondary | ICD-10-CM | POA: Diagnosis not present

## 2020-03-23 DIAGNOSIS — E119 Type 2 diabetes mellitus without complications: Secondary | ICD-10-CM

## 2020-03-23 DIAGNOSIS — J441 Chronic obstructive pulmonary disease with (acute) exacerbation: Secondary | ICD-10-CM | POA: Diagnosis present

## 2020-03-23 DIAGNOSIS — E1122 Type 2 diabetes mellitus with diabetic chronic kidney disease: Secondary | ICD-10-CM | POA: Diagnosis present

## 2020-03-23 DIAGNOSIS — E1165 Type 2 diabetes mellitus with hyperglycemia: Secondary | ICD-10-CM | POA: Diagnosis present

## 2020-03-23 DIAGNOSIS — C9110 Chronic lymphocytic leukemia of B-cell type not having achieved remission: Secondary | ICD-10-CM

## 2020-03-23 DIAGNOSIS — T380X5A Adverse effect of glucocorticoids and synthetic analogues, initial encounter: Secondary | ICD-10-CM | POA: Diagnosis present

## 2020-03-23 DIAGNOSIS — D72829 Elevated white blood cell count, unspecified: Secondary | ICD-10-CM | POA: Diagnosis present

## 2020-03-23 DIAGNOSIS — D519 Vitamin B12 deficiency anemia, unspecified: Secondary | ICD-10-CM | POA: Diagnosis present

## 2020-03-23 DIAGNOSIS — J189 Pneumonia, unspecified organism: Secondary | ICD-10-CM | POA: Diagnosis present

## 2020-03-23 DIAGNOSIS — I129 Hypertensive chronic kidney disease with stage 1 through stage 4 chronic kidney disease, or unspecified chronic kidney disease: Secondary | ICD-10-CM | POA: Diagnosis present

## 2020-03-23 DIAGNOSIS — J440 Chronic obstructive pulmonary disease with acute lower respiratory infection: Secondary | ICD-10-CM | POA: Diagnosis present

## 2020-03-23 DIAGNOSIS — E875 Hyperkalemia: Secondary | ICD-10-CM | POA: Diagnosis present

## 2020-03-23 DIAGNOSIS — I10 Essential (primary) hypertension: Secondary | ICD-10-CM | POA: Diagnosis present

## 2020-03-23 DIAGNOSIS — R569 Unspecified convulsions: Secondary | ICD-10-CM

## 2020-03-23 DIAGNOSIS — J9601 Acute respiratory failure with hypoxia: Secondary | ICD-10-CM | POA: Diagnosis present

## 2020-03-23 DIAGNOSIS — R7989 Other specified abnormal findings of blood chemistry: Secondary | ICD-10-CM | POA: Diagnosis not present

## 2020-03-23 DIAGNOSIS — E785 Hyperlipidemia, unspecified: Secondary | ICD-10-CM | POA: Diagnosis present

## 2020-03-23 DIAGNOSIS — Z794 Long term (current) use of insulin: Secondary | ICD-10-CM

## 2020-03-23 DIAGNOSIS — Z87891 Personal history of nicotine dependence: Secondary | ICD-10-CM | POA: Diagnosis not present

## 2020-03-23 DIAGNOSIS — G40909 Epilepsy, unspecified, not intractable, without status epilepticus: Secondary | ICD-10-CM | POA: Diagnosis present

## 2020-03-23 LAB — BASIC METABOLIC PANEL
Anion gap: 15 (ref 5–15)
Anion gap: 11 (ref 5–15)
BUN: 35 mg/dL — ABNORMAL HIGH (ref 6–20)
BUN: 38 mg/dL — ABNORMAL HIGH (ref 6–20)
CO2: 22 mmol/L (ref 22–32)
CO2: 21 mmol/L — ABNORMAL LOW (ref 22–32)
Calcium: 8.9 mg/dL (ref 8.9–10.3)
Calcium: 8.2 mg/dL — ABNORMAL LOW (ref 8.9–10.3)
Chloride: 98 mmol/L (ref 98–111)
Chloride: 104 mmol/L (ref 98–111)
Creatinine, Ser: 2.08 mg/dL — ABNORMAL HIGH (ref 0.61–1.24)
Creatinine, Ser: 1.76 mg/dL — ABNORMAL HIGH (ref 0.61–1.24)
GFR calc Af Amer: 42 mL/min — ABNORMAL LOW (ref >60)
GFR calc Af Amer: 51 mL/min — ABNORMAL LOW (ref >60)
GFR calc non Af Amer: 36 mL/min — ABNORMAL LOW (ref >60)
GFR calc non Af Amer: 44 mL/min — ABNORMAL LOW (ref >60)
Glucose, Bld: 216 mg/dL — ABNORMAL HIGH (ref 70–99)
Glucose, Bld: 277 mg/dL — ABNORMAL HIGH (ref 70–99)
Potassium: 6 mmol/L — ABNORMAL HIGH (ref 3.5–5.1)
Potassium: 5.7 mmol/L — ABNORMAL HIGH (ref 3.5–5.1)
Sodium: 135 mmol/L (ref 135–145)
Sodium: 136 mmol/L (ref 135–145)

## 2020-03-23 LAB — CMP (CANCER CENTER ONLY)
ALT: 15 U/L (ref 0–44)
AST: 14 U/L — ABNORMAL LOW (ref 15–41)
Albumin: 2.5 g/dL — ABNORMAL LOW (ref 3.5–5.0)
Alkaline Phosphatase: 78 U/L (ref 38–126)
Anion gap: 15 (ref 5–15)
BUN: 29 mg/dL — ABNORMAL HIGH (ref 6–20)
CO2: 21 mmol/L — ABNORMAL LOW (ref 22–32)
Calcium: 10.6 mg/dL — ABNORMAL HIGH (ref 8.9–10.3)
Chloride: 98 mmol/L (ref 98–111)
Creatinine: 2.33 mg/dL — ABNORMAL HIGH (ref 0.61–1.24)
GFR, Est AFR Am: 36 mL/min — ABNORMAL LOW (ref >60)
GFR, Estimated: 31 mL/min — ABNORMAL LOW (ref >60)
Glucose, Bld: 239 mg/dL — ABNORMAL HIGH (ref 70–99)
Potassium: 5.2 mmol/L — ABNORMAL HIGH (ref 3.5–5.1)
Sodium: 134 mmol/L — ABNORMAL LOW (ref 135–145)
Total Bilirubin: 0.6 mg/dL (ref 0.3–1.2)
Total Protein: 6.9 g/dL (ref 6.5–8.1)

## 2020-03-23 LAB — LACTIC ACID, PLASMA
Lactic Acid, Venous: 2.3 mmol/L (ref 0.5–1.9)
Lactic Acid, Venous: 1.9 mmol/L (ref 0.5–1.9)
Lactic Acid, Venous: 2.1 mmol/L (ref 0.5–1.9)

## 2020-03-23 LAB — CBC
HCT: 33.3 % — ABNORMAL LOW (ref 39.0–52.0)
Hemoglobin: 10 g/dL — ABNORMAL LOW (ref 13.0–17.0)
MCH: 27 pg (ref 26.0–34.0)
MCHC: 30 g/dL (ref 30.0–36.0)
MCV: 89.8 fL (ref 80.0–100.0)
Platelets: 267 10*3/uL (ref 150–400)
RBC: 3.71 MIL/uL — ABNORMAL LOW (ref 4.22–5.81)
RDW: 16.2 % — ABNORMAL HIGH (ref 11.5–15.5)
WBC: 22.5 10*3/uL — ABNORMAL HIGH (ref 4.0–10.5)
nRBC: 0 % (ref 0.0–0.2)

## 2020-03-23 LAB — CBC WITH DIFFERENTIAL (CANCER CENTER ONLY)
Abs Immature Granulocytes: 0.95 10*3/uL — ABNORMAL HIGH (ref 0.00–0.07)
Basophils Absolute: 0.1 10*3/uL (ref 0.0–0.1)
Basophils Relative: 1 %
Eosinophils Absolute: 0.2 10*3/uL (ref 0.0–0.5)
Eosinophils Relative: 1 %
HCT: 34.6 % — ABNORMAL LOW (ref 39.0–52.0)
Hemoglobin: 10.6 g/dL — ABNORMAL LOW (ref 13.0–17.0)
Immature Granulocytes: 4 %
Lymphocytes Relative: 6 %
Lymphs Abs: 1.5 10*3/uL (ref 0.7–4.0)
MCH: 26.8 pg (ref 26.0–34.0)
MCHC: 30.6 g/dL (ref 30.0–36.0)
MCV: 87.4 fL (ref 80.0–100.0)
Monocytes Absolute: 2 10*3/uL — ABNORMAL HIGH (ref 0.1–1.0)
Monocytes Relative: 8 %
Neutro Abs: 21 10*3/uL — ABNORMAL HIGH (ref 1.7–7.7)
Neutrophils Relative %: 80 %
Platelet Count: 279 10*3/uL (ref 150–400)
RBC: 3.96 MIL/uL — ABNORMAL LOW (ref 4.22–5.81)
RDW: 15.9 % — ABNORMAL HIGH (ref 11.5–15.5)
WBC Count: 25.7 10*3/uL — ABNORMAL HIGH (ref 4.0–10.5)
nRBC: 0 % (ref 0.0–0.2)

## 2020-03-23 LAB — LACTATE DEHYDROGENASE: LDH: 243 U/L — ABNORMAL HIGH (ref 98–192)

## 2020-03-23 LAB — URIC ACID: Uric Acid, Serum: 9.6 mg/dL — ABNORMAL HIGH (ref 3.7–8.6)

## 2020-03-23 LAB — CREATININE, SERUM
Creatinine, Ser: 1.82 mg/dL — ABNORMAL HIGH (ref 0.61–1.24)
GFR calc Af Amer: 49 mL/min — ABNORMAL LOW (ref >60)
GFR calc non Af Amer: 42 mL/min — ABNORMAL LOW (ref >60)

## 2020-03-23 LAB — HEMOGLOBIN A1C
Hgb A1c MFr Bld: 10 % — ABNORMAL HIGH (ref 4.8–5.6)
Mean Plasma Glucose: 240.3 mg/dL

## 2020-03-23 LAB — SARS CORONAVIRUS 2 BY RT PCR (HOSPITAL ORDER, PERFORMED IN ~~LOC~~ HOSPITAL LAB): SARS Coronavirus 2: NEGATIVE

## 2020-03-23 LAB — CBG MONITORING, ED
Glucose-Capillary: 217 mg/dL — ABNORMAL HIGH (ref 70–99)
Glucose-Capillary: 274 mg/dL — ABNORMAL HIGH (ref 70–99)

## 2020-03-23 LAB — BRAIN NATRIURETIC PEPTIDE: B Natriuretic Peptide: 19.5 pg/mL (ref 0.0–100.0)

## 2020-03-23 MED ORDER — ESCITALOPRAM OXALATE 20 MG PO TABS
20.0000 mg | ORAL_TABLET | Freq: Every day | ORAL | Status: DC
  Administered 2020-03-23 – 2020-03-26 (×4): 20 mg via ORAL
  Filled 2020-03-23 (×2): qty 1
  Filled 2020-03-23: qty 2
  Filled 2020-03-23: qty 1

## 2020-03-23 MED ORDER — ENOXAPARIN SODIUM 40 MG/0.4ML ~~LOC~~ SOLN
40.0000 mg | SUBCUTANEOUS | Status: DC
  Administered 2020-03-23: 40 mg via SUBCUTANEOUS
  Filled 2020-03-23: qty 0.4

## 2020-03-23 MED ORDER — DEXAMETHASONE SODIUM PHOSPHATE 10 MG/ML IJ SOLN
10.0000 mg | Freq: Once | INTRAMUSCULAR | Status: AC
  Administered 2020-03-23: 10 mg via INTRAVENOUS
  Filled 2020-03-23: qty 1

## 2020-03-23 MED ORDER — SODIUM CHLORIDE 0.9 % IV BOLUS
2000.0000 mL | Freq: Once | INTRAVENOUS | Status: AC
  Administered 2020-03-23: 2000 mL via INTRAVENOUS

## 2020-03-23 MED ORDER — SODIUM CHLORIDE 0.9 % IV BOLUS: 500.0000 mL | Freq: Once | INTRAVENOUS | Status: DC

## 2020-03-23 MED ORDER — SODIUM CHLORIDE 0.9 % IV SOLN
1.0000 g | Freq: Once | INTRAVENOUS | Status: AC
  Administered 2020-03-23: 1 g via INTRAVENOUS
  Filled 2020-03-23: qty 10

## 2020-03-23 MED ORDER — SODIUM CHLORIDE 0.9 % IV SOLN
2.0000 g | INTRAVENOUS | Status: DC
  Administered 2020-03-24 – 2020-03-26 (×3): 2 g via INTRAVENOUS
  Filled 2020-03-23 (×3): qty 2

## 2020-03-23 MED ORDER — ATORVASTATIN CALCIUM 10 MG PO TABS
10.0000 mg | ORAL_TABLET | Freq: Every day | ORAL | Status: DC
  Administered 2020-03-23 – 2020-03-26 (×4): 10 mg via ORAL
  Filled 2020-03-23 (×4): qty 1

## 2020-03-23 MED ORDER — ASPIRIN 81 MG PO CHEW
81.0000 mg | CHEWABLE_TABLET | Freq: Every day | ORAL | Status: DC
  Administered 2020-03-24 – 2020-03-26 (×3): 81 mg via ORAL
  Filled 2020-03-23 (×3): qty 1

## 2020-03-23 MED ORDER — PREDNISONE 20 MG PO TABS
20.0000 mg | ORAL_TABLET | Freq: Every day | ORAL | Status: DC
  Administered 2020-03-24 – 2020-03-26 (×3): 20 mg via ORAL
  Filled 2020-03-23 (×3): qty 1

## 2020-03-23 MED ORDER — SODIUM CHLORIDE 0.9 % IV SOLN: INTRAVENOUS | Status: DC

## 2020-03-23 MED ORDER — VITAMIN B-12 1000 MCG PO TABS
1000.0000 ug | ORAL_TABLET | Freq: Every day | ORAL | Status: DC
  Administered 2020-03-24 – 2020-03-26 (×3): 1000 ug via ORAL
  Filled 2020-03-23 (×3): qty 1

## 2020-03-23 MED ORDER — METOPROLOL TARTRATE 5 MG/5ML IV SOLN
5.0000 mg | Freq: Once | INTRAVENOUS | Status: AC
  Administered 2020-03-23: 5 mg via INTRAVENOUS
  Filled 2020-03-23: qty 5

## 2020-03-23 MED ORDER — SODIUM CHLORIDE 0.9 % IV BOLUS
1000.0000 mL | Freq: Once | INTRAVENOUS | Status: AC
  Administered 2020-03-23: 1000 mL via INTRAVENOUS

## 2020-03-23 MED ORDER — SODIUM CHLORIDE 0.9 % IV BOLUS (SEPSIS)
500.0000 mL | Freq: Once | INTRAVENOUS | Status: AC
  Administered 2020-03-23: 500 mL via INTRAVENOUS

## 2020-03-23 MED ORDER — ALBUTEROL SULFATE HFA 108 (90 BASE) MCG/ACT IN AERS
4.0000 | INHALATION_SPRAY | Freq: Once | RESPIRATORY_TRACT | Status: AC
  Administered 2020-03-23: 4 via RESPIRATORY_TRACT
  Filled 2020-03-23: qty 6.7

## 2020-03-23 MED ORDER — SODIUM CHLORIDE 0.9 % IV SOLN
1000.0000 mL | INTRAVENOUS | Status: DC
  Administered 2020-03-23: 1000 mL via INTRAVENOUS

## 2020-03-23 MED ORDER — ALBUTEROL SULFATE (2.5 MG/3ML) 0.083% IN NEBU: 2.5000 mg | INHALATION_SOLUTION | RESPIRATORY_TRACT | Status: DC | PRN

## 2020-03-23 MED ORDER — SODIUM ZIRCONIUM CYCLOSILICATE 10 G PO PACK
10.0000 g | PACK | Freq: Once | ORAL | Status: AC
  Administered 2020-03-23: 10 g via ORAL
  Filled 2020-03-23: qty 1

## 2020-03-23 MED ORDER — GUAIFENESIN-DM 100-10 MG/5ML PO SYRP
5.0000 mL | ORAL_SOLUTION | ORAL | Status: DC | PRN
  Administered 2020-03-23 – 2020-03-24 (×2): 5 mL via ORAL
  Filled 2020-03-23 (×2): qty 10

## 2020-03-23 MED ORDER — INSULIN ASPART 100 UNIT/ML ~~LOC~~ SOLN
0.0000 [IU] | Freq: Three times a day (TID) | SUBCUTANEOUS | Status: DC
  Administered 2020-03-23: 5 [IU] via SUBCUTANEOUS
  Administered 2020-03-24: 11 [IU] via SUBCUTANEOUS
  Administered 2020-03-24 (×2): 8 [IU] via SUBCUTANEOUS
  Filled 2020-03-23: qty 0.15

## 2020-03-23 MED ORDER — IPRATROPIUM-ALBUTEROL 0.5-2.5 (3) MG/3ML IN SOLN
3.0000 mL | Freq: Four times a day (QID) | RESPIRATORY_TRACT | Status: DC
  Administered 2020-03-23 – 2020-03-26 (×12): 3 mL via RESPIRATORY_TRACT
  Filled 2020-03-23 (×12): qty 3

## 2020-03-23 MED ORDER — PANTOPRAZOLE SODIUM 40 MG PO TBEC
40.0000 mg | DELAYED_RELEASE_TABLET | Freq: Every day | ORAL | Status: DC
  Administered 2020-03-23 – 2020-03-26 (×4): 40 mg via ORAL
  Filled 2020-03-23 (×4): qty 1

## 2020-03-23 MED ORDER — AZITHROMYCIN 250 MG PO TABS
500.0000 mg | ORAL_TABLET | Freq: Once | ORAL | Status: AC
  Administered 2020-03-23: 500 mg via ORAL
  Filled 2020-03-23: qty 2

## 2020-03-23 MED ORDER — INSULIN DETEMIR 100 UNIT/ML ~~LOC~~ SOLN
40.0000 [IU] | Freq: Every day | SUBCUTANEOUS | Status: DC
  Administered 2020-03-23 – 2020-03-24 (×2): 40 [IU] via SUBCUTANEOUS
  Filled 2020-03-23 (×4): qty 0.4

## 2020-03-23 MED ORDER — SODIUM CHLORIDE 0.9 % IV SOLN
500.0000 mg | INTRAVENOUS | Status: DC
  Administered 2020-03-24 – 2020-03-26 (×3): 500 mg via INTRAVENOUS
  Filled 2020-03-23 (×3): qty 500

## 2020-03-23 NOTE — ED Provider Notes (Signed)
[facility] DEPT Provider Note   CSN: [id number] Arrival date & time: 03/23/20  0160     History Chief Complaint  Patient presents with   Shortness of Breath   Abnormal Lab   Tachycardia    Larry Lam is a 49 y.o. male.  HPI   Patient presents to the ED for evaluation of cough and shortness of breath.  Patient quit smoking about 3 weeks ago.  The last couple of weeks he has had issues with increasing cough productive of sputum.  Initially there was some color to it but it is now mostly clear.  Patient's had progressive shortness of breath over the last couple of weeks increasing with mild activity.  Patient has COPD and oxygen available at home.  Normally only wears it at night.  Patient has not had any fevers.  He denies any ill contacts.  He has had some discomfort in his chest with coughing.  He denies any leg  swelling.  Patient states he was at the oncology office today for an appointment.  Patient does have a history of CLL.  They did lab test today.  It showed a leukocytosis of 25.7.  Hemoglobin is 10.6.  Electrolyte panel was notable for glucose of 239.  Elevated BUN and creatinine of 29 and 2.33 calcium also increased at 10.6.  LDH and uric acid were also elevated  Past Medical History:  Diagnosis Date   Asthma    cll dx'd 04/2019   COPD (chronic obstructive pulmonary disease) (HCC)    Diabetes mellitus without complication ([place])    Hypertension    Seizures ([place])    3-4 years ago. Single episode.   Tobacco use     Patient Active Problem List   Diagnosis Date Noted   Elevated serum creatinine 03/23/2020   Encounter for antineoplastic chemotherapy 12/01/2019   Idiopathic hypotension    Anemia due to vitamin B12 deficiency    Thrombocytopenia (H[facility])    Multifocal pneumonia 08/05/2019   Lymphadenopathy 05/06/2019   Splenomegaly 05/06/2019   Lung nodules 05/06/2019   CLL (chronic lymphocytic leukemia) ([place])  04/21/2019   Cervical lymphadenopathy 04/21/2019   Shortness of breath 04/21/2019   Goals of care, counseling/discussion 04/21/2019   Chest pain    Hypophosphatemia 04/28/2017   Uncontrolled type 2 diabetes mellitus with hyperglycemia, without long-term current use of insulin ([place]) 04/28/2017   COPD exacerbation ([place]) 04/27/2017   Tobacco use 04/27/2017   Seizures ([place]) 01/19/2015   Hypertension 01/19/2015   Hypokalemia 01/19/2015   Leucocytosis 01/19/2015   DM (diabetes mellitus) ([place]) 01/19/2015   Chronic obstructive pulmonary disease ([place]) 01/19/2015    History reviewed. No pertinent surgical history.     Family History  Problem Relation Age of Onset   Lung cancer Maternal Grandfather     Social History   Tobacco Use   Smoking status: Former Smoker    Packs/day: 1.00    Years: 33.00    Pack years: 33.00    Types: Cigarettes    Quit date: 03/23/2019    Years since quitting: 1.0   Smokeless tobacco: Former Counsellor Use: Never used  Substance Use Topics   Alcohol use: Yes    Alcohol/week: 0.0 standard drinks    Comment: rare   Drug use: No    Comment: smokes pot    Home Medications Prior to Admission medications   Medication Sig Start Date End Date Taking? Authorizing Provider  ASPIRIN 81 PO  Take 81 mg by mouth daily.    Yes [provider]  atorvastatin (LIPITOR) 10 MG tablet Take 10 mg by mouth daily. 11/18/19  Yes [provider]  CALQUENCE 100 MG capsule TAKE 1 CAPSULE (100 MG TOTAL) BY MOUTH 2 (TWO) TIMES DAILY. Patient taking differently: Take 100 mg by mouth 2 (two) times daily. TAKE 1 CAPSULE (100 MG TOTAL) BY MOUTH 2 (TWO) TIMES DAILY. 02/03/20  Yes Curt Bears, MD  escitalopram (LEXAPRO) 20 MG tablet Take 20 mg by mouth daily. 05/11/19  Yes [provider]  ibuprofen (ADVIL) 200 MG tablet Take 400 mg by mouth every 6 (six) hours as needed for headache or moderate pain.   Yes [provider]  Insulin Detemir (LEVEMIR) 100 UNIT/ML Pen Inject 30 Units into the skin daily. Patient taking differently: Inject 46 Units into the skin daily.  08/13/19 03/23/20 Yes [place] Eric J, DO  ipratropium-albuterol (DUONEB) 0.5-2.5 (3) MG/3ML SOLN Take 3 mLs by nebulization every 6 (six) hours as needed. J44.9 and J44.1 04/29/17  Yes Tat, Shanon Brow, MD  lisinopril (ZESTRIL) 40 MG tablet Take 40 mg by mouth daily. 11/12/19  Yes [provider]  metFORMIN (GLUCOPHAGE-XR) 500 MG 24 hr tablet Take 1,000 mg by mouth 2 (two) times daily. 11/18/19  Yes [provider]  pantoprazole (PROTONIX) 40 MG tablet Take 1 tablet (40 mg total) by mouth daily. 08/13/19 08/12/20 Yes [place] Eric J, DO  vitamin B-12 (CYANOCOBALAMIN) 1000 MCG tablet Take 1 tablet (1,000 mcg total) by mouth daily. 08/02/19  Yes Earlie Server, MD  albuterol (PROVENTIL HFA;VENTOLIN HFA) 108 (90 Base) MCG/ACT inhaler Inhale 2 puffs into the lungs every 6 (six) hours as needed for wheezing or shortness of breath. Patient not taking: Reported on 03/23/2020 04/29/17   Orson Eva, MD  allopurinol (ZYLOPRIM) 100 MG tablet Take 1 tablet (100 mg total) by mouth 2 (two) times daily. Patient not taking: Reported on 03/23/2020 08/13/19 09/12/19  [place] Eric J, DO  Insulin Pen Needle (PEN NEEDLES 3/16") 31G X 5 MM MISC Use as directed with insulin pen 08/13/19   [place] Donnamarie Poag, DO    Allergies    Amlodipine besylate-valsartan and Hydrochlorothiazide  Review of Systems   Review of Systems  Physical Exam Updated Vital Signs BP (!) 104/64 (BP Location: Right Arm)    Pulse 92    Temp 98.5 F (36.9 C) (Oral)    Resp (!) 29    SpO2 93%   Physical Exam Vitals and nursing note reviewed.  Constitutional:      General: He is not in acute distress.    Appearance: He is well-developed.  HENT:     Head: Normocephalic and atraumatic.     Right Ear: External ear normal.     Left Ear: External ear normal.  Eyes:     General: No scleral  icterus.       Right eye: No discharge.        Left eye: No discharge.     Conjunctiva/sclera: Conjunctivae normal.  Neck:     Trachea: No tracheal deviation.  Cardiovascular:     Rate and Rhythm: Tachycardia present.  Pulmonary:     Effort: Pulmonary effort is normal. No respiratory distress.     Breath sounds: No stridor.  Abdominal:     General: There is no distension.  Musculoskeletal:        General: No swelling or deformity.     Cervical back: Neck supple.  Skin:  General: Skin is warm and dry.     Findings: No rash.  Neurological:     Mental Status: He is alert.     Cranial Nerves: Cranial nerve deficit: no gross deficits.     ED Results / Procedures / Treatments   Labs (all labs ordered are listed, but only abnormal results are displayed) Labs Reviewed  SARS CORONAVIRUS 2 BY RT PCR [facility] ORDER, [place] LAB)  [place]    EKG EKG Interpretation  Date/Time:  Thursday March 23 2020 09:47:26 EDT Ventricular Rate:  114 PR Interval:    QRS Duration: 93 QT Interval:  305 QTC Calculation: 420 R Axis:   101 Text Interpretation: Sinus tachycardia Consider left atrial enlargement Right axis deviation Low voltage, extremity and precordial leads ST elev, probable normal early repol pattern No significant change since last tracing Confirmed by Dorie Rank [id number]) on 03/23/2020 10:14:05 AM   Radiology DG Chest Portable 1 View  Result Date: 03/23/2020 CLINICAL DATA:  Worsening shortness of breath, hypoxia. History of leukemia EXAM: PORTABLE CHEST 1 VIEW COMPARISON:  01/21/2020 FINDINGS: The heart size and mediastinal contours are within normal limits. Interval development of dense airspace consolidation within the left lung base and left mid lung. Small left pleural effusion. Right lung is clear. No pneumothorax. IMPRESSION: 1. Interval development of dense airspace consolidation within the left lung base and left mid lung,  concerning for pneumonia. 2. Small left pleural effusion. Electronically Signed   By: Davina Poke D.O.   On: 03/23/2020 11:04    Procedures Procedures (including critical care time)  Medications Ordered in ED Medications  sodium chloride 0.9 % bolus 500 mL (0 mLs Intravenous Stopped 03/23/20 1135)    Followed by  0.9 %  sodium chloride infusion (1,000 mLs Intravenous New Bag/Given 03/23/20 1135)  albuterol (VENTOLIN HFA) 108 (90 Base) MCG/ACT inhaler 4 puff (4 puffs Inhalation Given 03/23/20 1044)  dexamethasone (DECADRON) injection 10 mg (10 mg Intravenous Given 03/23/20 1044)  cefTRIAXone (ROCEPHIN) 1 g in sodium chloride 0.9 % 100 mL IVPB (1 g Intravenous New Bag/Given 03/23/20 1232)  azithromycin (ZITHROMAX) tablet 500 mg (500 mg Oral Given 03/23/20 1233)    ED Course  I have reviewed the triage vital signs and the nursing notes.  Pertinent labs & imaging results that were available during my care of the patient were reviewed by me and considered in my medical decision making (see chart for details).  Clinical Course as of Mar 23 1325  Thu Mar 23, 2020  1031 Notes reviewed at the cancer center.  When patient arrived his oxygen saturation was 82% on room air   [JK]  1209 Covid test is negative   [JK]  1209 Chest x-ray is concerning for pneumonia   [JK]    Clinical Course User Index [JK] Dorie Rank, MD   MDM Rules/Calculators/A&P                          Patient presented to the emergency room for evaluation of increasing shortness of breath.  Patient does have multiple comorbidities.  Patient is requiring more oxygen than usual.  His x-ray does show pneumonia.  Covid is negative.  Patient does have leukocytosis as well as an AKI.  I have ordered IV antibiotics.  I will consult with the medical service for admission and further treatment.  Final Clinical Impression(s) / ED Diagnoses Final diagnoses:  Community acquired pneumonia, unspecified laterality  AKI (acute kidney  injury) ([place])      Dorie Rank, MD 03/23/20 1329

## 2020-03-23 NOTE — ED Notes (Signed)
Pt wears 2L home o2 at bedtime as needed.

## 2020-03-23 NOTE — Progress Notes (Signed)
Patient arrived to unit, Yellow MEWS. Hospitalist provider updated. V.O received to administer Lopressor IV, 5 mg once.   03/23/20 1903  Assess: MEWS Score  Temp 98.2 F (36.8 C)  BP (!) 164/74  Pulse Rate (!) 124  Resp 20  SpO2 99 %  O2 Device Nasal Cannula  O2 Flow Rate (L/min) 2 L/min  Assess: MEWS Score  MEWS Temp 0  MEWS Systolic 0  MEWS Pulse 2  MEWS RR 0  MEWS LOC 0  MEWS Score 2  MEWS Score Color Yellow  Assess: if the MEWS score is Yellow or Red  Were vital signs taken at a resting state? Yes  Focused Assessment No change from prior assessment  Treat  MEWS Interventions Escalated (See documentation below)  Pain Scale 0-10  Take Vital Signs  Increase Vital Sign Frequency  Yellow: Q 2hr X 2 then Q 4hr X 2, if remains yellow, continue Q 4hrs  Escalate  MEWS: Escalate Yellow: discuss with charge nurse/RN and consider discussing with provider and RRT  Notify: Charge Nurse/RN  Name of Charge Nurse/RN Notified Sharyn Blitz  Date Charge Nurse/RN Notified 03/23/20  Time Charge Nurse/RN Notified 1909  Notify: Provider  Provider Name/Title J.MANSY (night provider updated)  Date Provider Notified 03/23/20  Time Provider Notified 1910  Notification Type Page  Notification Reason Other (Comment) (NEW ADMIT)

## 2020-03-23 NOTE — H&P (Signed)
History and Physical    Larry Lam QPY:[id number] DOB: 1971-01-06 DOA: 03/23/2020  PCP: [name], MD   Patient coming from: Oncology office, lives with wife/`50yo grandson.  Chief complaint: Tachycardia and shortness of breath.  HPI: Larry Lam is a 49 y.o. male with medical history significant for CLL followed up at oncology clinic on monthly Obinutuzmab, diabetes mellitus, COPD/asthma, seizure disorder, hypertension sent from oncology office due to fast heart rate and hypoxia. He reports he quit smoking 3 wks ago and feels everything started going down hill since. He started having shortness of breath, was using his Duoneb more than usual 4-5 times daily, was having cough with phlegm, along with chills, he was having rib pain with coughing- currently none.He has oxygen at home usually as needed basis and at bedtime.  Patient was seen at the oncology office today for chemo infusion and had lab work done this morning showing hyperkalemia elevated white counts and elevated creatinine. He was short of beeath and tachycardic at 130 chemo was not given  Was found to be hypoxic 80% room air, he was placed on nasal cannula and sent to the ED.  ED Course:Blood pressure fairly stable in 90s to 100, heart rate 88-101, saturating 91 to 93% on nasal cannula.Blood work was not done early morning at oncology clinic showing elevated creatinine 2.3 potassium 5.2, WBC count 25.7,LDH 243 and repeat BMP pending.  X-ray showed left lower lobe consolidation concerning for pneumonia.  90 -, patient was given bronchodilator, ceftriaxone, azithromycin, Decadron, fluid bolus and admission was requested for further management.  Review of Systems: All systems were reviewed and were negative except as mentioned in HPI above. Negative for fever Negative for chest pain Negative for focal weakness  Past Medical History:  Diagnosis Date  . Asthma   . cll dx'd 04/2019  . COPD (chronic obstructive  pulmonary disease) ([place])   . Diabetes mellitus without complication ([place])   . Hypertension   . Seizures ([place])    3-4 years ago. Single episode.  . Tobacco use     History reviewed. No pertinent surgical history.   reports that he quit smoking about a year ago. His smoking use included cigarettes. He has a 33.00 pack-year smoking history. He has quit using smokeless tobacco. He reports current alcohol use. He reports that he does not use drugs.  Allergies  Allergen Reactions  . Amlodipine Besylate-Valsartan Other (See Comments)    Lowered potassium  . Hydrochlorothiazide Other (See Comments)    Lowered potassium    Family History  Problem Relation Age of Onset  . Lung cancer Maternal Grandfather      Prior to Admission medications   Medication Sig Start Date End Date Taking? Authorizing Provider  ASPIRIN 81 PO Take 81 mg by mouth daily.    Yes [provider]  atorvastatin (LIPITOR) 10 MG tablet Take 10 mg by mouth daily. 11/18/19  Yes [provider]  CALQUENCE 100 MG capsule TAKE 1 CAPSULE (100 MG TOTAL) BY MOUTH 2 (TWO) TIMES DAILY. Patient taking differently: Take 100 mg by mouth 2 (two) times daily. TAKE 1 CAPSULE (100 MG TOTAL) BY MOUTH 2 (TWO) TIMES DAILY. 02/03/20  Yes Curt Bears, MD  escitalopram (LEXAPRO) 20 MG tablet Take 20 mg by mouth daily. 05/11/19  Yes [provider]  ibuprofen (ADVIL) 200 MG tablet Take 400 mg by mouth every 6 (six) hours as needed for headache or moderate pain.   Yes [provider]  Insulin Detemir (LEVEMIR) 100 UNIT/ML Pen Inject 30 Units into the skin daily. Patient taking differently: Inject 46 Units into the skin daily.  08/13/19 03/23/20 Yes [place] Eric J, DO  ipratropium-albuterol (DUONEB) 0.5-2.5 (3) MG/3ML SOLN Take 3 mLs by nebulization every 6 (six) hours as needed. J44.9 and J44.1 04/29/17  Yes Tat, Shanon Brow, MD  lisinopril (ZESTRIL) 40 MG tablet Take 40 mg by mouth daily. 11/12/19  Yes [provider]  metFORMIN (GLUCOPHAGE-XR) 500 MG 24 hr tablet Take 1,000 mg by mouth 2 (two) times daily. 11/18/19  Yes [provider]  pantoprazole (PROTONIX) 40 MG tablet Take 1 tablet (40 mg total) by mouth daily. 08/13/19 08/12/20 Yes [place] Eric J, DO  vitamin B-12 (CYANOCOBALAMIN) 1000 MCG tablet Take 1 tablet (1,000 mcg total) by mouth daily. 08/02/19  Yes Earlie Server, MD  albuterol (PROVENTIL HFA;VENTOLIN HFA) 108 (90 Base) MCG/ACT inhaler Inhale 2 puffs into the lungs every 6 (six) hours as needed for wheezing or shortness of breath. Patient not taking: Reported on 03/23/2020 04/29/17   Orson Eva, MD  allopurinol (ZYLOPRIM) 100 MG tablet Take 1 tablet (100 mg total) by mouth 2 (two) times daily. Patient not taking: Reported on 03/23/2020 08/13/19 09/12/19  [place] Eric J, DO  Insulin Pen Needle (PEN NEEDLES 3/16") 31G X 5 MM MISC Use as directed with insulin pen 08/13/19   [place] Eric J, [place]    Physical Exam: Vitals:   03/23/20 1313 03/23/20 1315 03/23/20 1345 03/23/20 1445  BP: (!) 104/64  (!) 98/54 (!) 106/51  Pulse: 88 92 100 97  Resp: (!) 28 (!) 29 (!) 29 (!) 25  Temp: 98.5 F (36.9 C)  98.5 F (36.9 C) 98.5 F (36.9 C)  TempSrc: Oral  Oral Oral  SpO2: 94% 93% 92% 95%    General exam: AAOx3,NAD, weak appearing. HEENT:Oral mucosa moist,Ear/Nose WNL grossly, dentition normal. Respiratory system: bilaterally expiratory,wheezing,no use of accessory muscle Cardiovascular system: S1 & S2 +, No JVD,. Gastrointestinal system: Abdomen soft, NT,ND, BS+ Nervous System:Alert, awake, moving extremities and grossly nonfocal Extremities: No edema, distal peripheral pulses palpable.  Skin: No rashes,no icterus. MSK: Normal muscle bulk,tone, power   Labs on Admission: I have personally reviewed following labs and imaging studies  CBC: Recent Labs  Lab 03/23/20 0832  WBC 25.7*  NEUTROABS 21.0*  HGB 10.6*  HCT 34.6*  MCV 87.4  PLT 998   Basic Metabolic Panel: Recent  Labs  Lab 03/23/20 0832  NA 134*  K 5.2*  CL 98  CO2 21*  GLUCOSE 239*  BUN 29*  CREATININE 2.33*  CALCIUM 10.6*   GFR: Estimated Creatinine Clearance: 46 mL/min (A) (by C-G formula based on SCr of 2.33 mg/dL (H)). Liver Function Tests: Recent Labs  Lab 03/23/20 0832  AST 14*  ALT 15  ALKPHOS 78  BILITOT 0.6  PROT 6.9  ALBUMIN 2.5*   No results for input(s): LIPASE, AMYLASE in the last 168 hours. No results for input(s): AMMONIA in the last 168 hours. Coagulation Profile: No results for input(s): INR, PROTIME in the last 168 hours. Cardiac Enzymes: No results for input(s): CKTOTAL, CKMB, CKMBINDEX, TROPONINI in the last 168 hours. BNP (last 3 results) No results for input(s): PROBNP in the last 8760 hours. HbA1C: No results for input(s): HGBA1C in the last 72 hours. CBG: No results for input(s): GLUCAP in the last 168 hours. Lipid Profile: No results for input(s): CHOL, HDL, LDLCALC, TRIG, CHOLHDL, LDLDIRECT in the last 72 hours. Thyroid Function Tests: No  results for input(s): TSH, T4TOTAL, FREET4, T3FREE, THYROIDAB in the last 72 hours. Anemia Panel: No results for input(s): VITAMINB12, FOLATE, FERRITIN, TIBC, IRON, RETICCTPCT in the last 72 hours. Urine analysis:    Component Value Date/Time   COLORURINE YELLOW 08/07/2019 1429   APPEARANCEUR CLEAR 08/07/2019 1429   APPEARANCEUR Clear 07/05/2012 0103   LABSPEC 1.023 08/07/2019 1429   LABSPEC 1.033 07/05/2012 0103   PHURINE 5.0 08/07/2019 1429   GLUCOSEU NEGATIVE 08/07/2019 1429   GLUCOSEU >=500 07/05/2012 0103   HGBUR SMALL (A) 08/07/2019 1429   BILIRUBINUR NEGATIVE 08/07/2019 1429   BILIRUBINUR Negative 07/05/2012 0103   KETONESUR 5 (A) 08/07/2019 1429   PROTEINUR 30 (A) 08/07/2019 1429   NITRITE NEGATIVE 08/07/2019 1429   LEUKOCYTESUR NEGATIVE 08/07/2019 1429   LEUKOCYTESUR Negative 07/05/2012 0103    Radiological Exams on Admission: DG Chest Portable 1 View  Result Date: 03/23/2020 CLINICAL  DATA:  Worsening shortness of breath, hypoxia. History of leukemia EXAM: PORTABLE CHEST 1 VIEW COMPARISON:  01/21/2020 FINDINGS: The heart size and mediastinal contours are within normal limits. Interval development of dense airspace consolidation within the left lung base and left mid lung. Small left pleural effusion. Right lung is clear. No pneumothorax. IMPRESSION: 1. Interval development of dense airspace consolidation within the left lung base and left mid lung, concerning for pneumonia. 2. Small left pleural effusion. Electronically Signed   By: Davina Poke D.O.   On: 03/23/2020 11:04   EKG personally reviewed sinus tachycardia no significant change since last EKG.  Assessment/Plan  LLL pneumonia with hypoxia tachycardia, patient met sepsis criteria POA with leukocytosis tachycardia: Patient has been started on ceftriaxone and azithromycin.  We will continue the same, check sputum culture, strep antigen and Legionella antigen follow-up CBC, continue supplemental oxygen/bronchodilators.  Order lactic acid and noted to be high at 2.3, patient has received  2.5l bolus- woll order  Liter bolus further. Repeat lactic acid in 3 hrs  Acute exacerbation of COPD: got decadro nin ED ,sensitive t osteroid 2/2 DM. Add prednisone 20 mg daily x5, cont dounebs, [place] 02.  Acute hypoxic respiratory failure likely from pneumonia  And copd exacerbation, with baseline oxygen requirement at bedtime oxygen in the setting of COPD  AKI on CKD stage IIIA-baseline creatinine 1.4-1.6, likely due to #1.  Currently BUN 29 creatinine 2.33.  Hold lisinopril, hydrate the patient and follow BMP if still not improving will do work-up with ultrasound kidney.  Hyperkalemia- mild- hold lisinopril. Repeat labs pending.  Repeat labs came with potassium further of 6.0, get an EKG we will give half liter bolus Lokelma x1.  Initial EKG no acute CHANGE. WILL ORDER EKG  Now- check potassium every 4 hours monitor in  telemetry.  Hypertension blood pressure soft hold lisinopril due to AKI.  DM on long-term insulin last A1c 7.7 07/31/2019.  On Levemir 46 units daily we will keep at 40 units and increase slowly- reasses in am, add sliding scale insulin, check hemoglobin A1c.  COPD/history of asthma, continue bronchodilators with DuoNeb.   CLL-followed at oncology clinic, Dr Mckinley Jewel, on monthly Obinutuzmab- due today but heldHas leukocytosis but previously WBC count was around 11.7K early July  Anemia due to vitamin B12 deficiency-cont b12 1000 mc gdaily  There is no height or weight on file to calculate BMI.   Severity of Illness:  * I certify that at the point of admission it is my clinical judgment that the patient will require inpatient hospital care spanning beyond 2 midnights from the  point of admission due to high intensity of service, high risk for further deterioration and high frequency of surveillance required due to pneumonia hypoxia    DVT prophylaxis: enoxaparin (LOVENOX) injection 40 mg Start: 03/23/20 1500 Code Status:   Code Status: Full Code  Family Communication: Admission, patients condition and plan of care including tests being ordered have been discussed with the patient  who indicate understanding and agree with the plan and Code Status.  Consults called:  None  Antonieta Pert MD [facility]  If 7PM-7AM, please contact night-coverage [email or web address]  03/23/2020, 3:01 PM

## 2020-03-23 NOTE — ED Notes (Signed)
Coming from cancer center with SOB, elevated HR

## 2020-03-23 NOTE — ED Notes (Signed)
ED TO INPATIENT HANDOFF REPORT  Name/Age/Gender Larry Lam 49 y.o. male  Code Status    Code Status Orders  (From admission, onward)         Start     Ordered   03/23/20 1457  Full code  Continuous        03/23/20 1457        Code Status History    Date Active Date Inactive Code Status Order ID Comments User Context   08/05/2019 2244 08/13/2019 1730 Full Code [id number]  Bethena Roys, MD Inpatient   07/31/2019 0427 08/02/2019 1436 Full Code [id number]  Lynetta Mare, MD Inpatient   04/28/2017 0149 04/29/2017 2035 Full Code [id number]  Reubin Milan, MD ED   01/19/2015 1153 01/19/2015 1927 Full Code [id number]  Juluis Mire, MD Inpatient   Advance Care Planning Activity      Home/SNF/Other Home  Chief Complaint Pneumonia [J18.9]  Level of Care/Admitting Diagnosis ED Disposition    ED Disposition Condition [place]: [facility] [[id number]]  Level of Care: Telemetry [5]  Admit to tele based on following criteria: Other see comments  Comments: hyperkalemia  May admit patient to [facility] or [facility] if equivalent level of care is available:: Yes  Covid Evaluation: Confirmed COVID Negative  Diagnosis: Pneumonia [227785]  Admitting Physician: Antonieta Pert [[id number]]  Attending Physician: Antonieta Pert [[id number]]  Estimated length of stay: past midnight tomorrow  Certification:: I certify this patient will need inpatient services for at least 2 midnights       Medical History Past Medical History:  Diagnosis Date  . Asthma   . cll dx'd 04/2019  . COPD (chronic obstructive pulmonary disease) ([place])   . Diabetes mellitus without complication ([place])   . Hypertension   . Seizures ([place])    3-4 years ago. Single episode.  . Tobacco use     Allergies Allergies  Allergen Reactions  . Amlodipine Besylate-Valsartan Other (See Comments)    Lowered potassium  . Hydrochlorothiazide Other (See Comments)     Lowered potassium    IV Location/Drains/Wounds Patient Lines/Drains/Airways Status    Active Line/Drains/Airways    Name Placement date Placement time Site Days   Peripheral IV 03/23/20 Right Forearm 03/23/20  1001  Forearm  less than 1   Peripheral IV 03/23/20 Left;Posterior Hand 03/23/20  1430  Hand  less than 1   Incision (Closed) 08/13/19 Buttocks Right 08/13/19  0921   223          Labs/Imaging Results for orders placed or performed during the hospital encounter of 03/23/20 (from the past 48 hour(s))  SARS Coronavirus 2 by RT PCR (hospital order, performed in [facility] hospital lab) Nasopharyngeal Nasopharyngeal Swab     Status: None   Collection Time: 03/23/20 10:26 AM   Specimen: Nasopharyngeal Swab  Result Value Ref Range   SARS Coronavirus 2 NEGATIVE NEGATIVE    Comment: (NOTE) SARS-CoV-2 target nucleic acids are NOT DETECTED.  The SARS-CoV-2 RNA is generally detectable in upper and lower respiratory specimens during the acute phase of infection. The lowest concentration of SARS-CoV-2 viral copies this assay can detect is 250 copies / mL. A negative result does not preclude SARS-CoV-2 infection and should not be used as the sole basis for treatment or other patient management decisions.  A negative result may occur with improper specimen collection / handling, submission of specimen other than nasopharyngeal swab, presence of viral mutation(s) within the  areas targeted by this assay, and inadequate number of viral copies (<250 copies / mL). A negative result must be combined with clinical observations, patient history, and epidemiological information.  Fact Sheet for Patients:   StrictlyIdeas.no  Fact Sheet for Healthcare Providers: BankingDealers.co.za  This test is not yet approved or  cleared by the [place] FDA and has been authorized for detection and/or diagnosis of SARS-CoV-2 by FDA under an Emergency  Use Authorization (EUA).  This EUA will remain in effect (meaning this test can be used) for the duration of the COVID-19 declaration under Section 564(b)(1) of the Act, 21 U.S.C. section 360bbb-3(b)(1), unless the authorization is terminated or revoked sooner.  Performed at [facility], [place] [address] [place], [place] [address]   Brain natriuretic peptide     Status: None   Collection Time: 03/23/20 10:27 AM  Result Value Ref Range   B Natriuretic Peptide 19.5 0.0 - 100.0 pg/mL    Comment: Performed at [facility], [place] [address] [place], [place] [address]  Basic metabolic panel     Status: Abnormal   Collection Time: 03/23/20  2:33 PM  Result Value Ref Range   Sodium 135 135 - 145 mmol/L   Potassium 6.0 (H) 3.5 - 5.1 mmol/L   Chloride 98 98 - 111 mmol/L   CO2 22 22 - 32 mmol/L   Glucose, Bld 216 (H) 70 - 99 mg/dL    Comment: Glucose reference range applies only to samples taken after fasting for at least 8 hours.   BUN 35 (H) 6 - 20 mg/dL   Creatinine, Ser 2.08 (H) 0.61 - 1.24 mg/dL   Calcium 8.9 8.9 - 10.3 mg/dL   GFR calc non Af Amer 36 (L) >60 mL/min   GFR calc Af Amer 42 (L) >60 mL/min   Anion gap 15 5 - 15    Comment: Performed at [facility], [place] [address], [place], [place] [address]  Lactic acid, plasma     Status: Abnormal   Collection Time: 03/23/20  2:33 PM  Result Value Ref Range   Lactic Acid, Venous 2.3 (HH) 0.5 - 1.9 mmol/L    Comment: CRITICAL RESULT CALLED TO, READ BACK BY AND VERIFIED WITH: WEST,S. RN @1531  ON 07.29.2021 BY COHEN,K Performed at [facility], [place] [address], [place], [place] [address]   CBG monitoring, ED     Status: Abnormal   Collection Time: 03/23/20  3:53 PM  Result Value Ref Range   Glucose-Capillary 217 (H) 70 - 99 mg/dL    Comment: Glucose reference range applies only to samples taken after fasting for at least 8 hours.  CBG monitoring, ED     Status:  Abnormal   Collection Time: 03/23/20  6:34 PM  Result Value Ref Range   Glucose-Capillary 274 (H) 70 - 99 mg/dL    Comment: Glucose reference range applies only to samples taken after fasting for at least 8 hours.   DG Chest Portable 1 View  Result Date: 03/23/2020 CLINICAL DATA:  Worsening shortness of breath, hypoxia. History of leukemia EXAM: PORTABLE CHEST 1 VIEW COMPARISON:  01/21/2020 FINDINGS: The heart size and mediastinal contours are within normal limits. Interval development of dense airspace consolidation within the left lung base and left mid lung. Small left pleural effusion. Right lung is clear. No pneumothorax. IMPRESSION: 1. Interval development of dense airspace consolidation within the left lung base and left mid lung, concerning for pneumonia. 2. Small left pleural  effusion. Electronically Signed   By: Davina Poke D.O.   On: 03/23/2020 11:04    Pending Labs Unresulted Labs (From admission, onward) Comment          Start     Ordered   03/30/20 0500  Creatinine, serum  (enoxaparin (LOVENOX)    CrCl >/= 30 ml/min)  Weekly,   R     Comments: while on enoxaparin therapy    03/23/20 1457   03/24/20 0500  Comprehensive metabolic panel  Tomorrow morning,   R        03/23/20 1457   03/24/20 0500  CBC WITH DIFFERENTIAL  Tomorrow morning,   R        03/23/20 1457   03/23/20 1838  Lactic acid, plasma  ONCE - STAT,   STAT        03/23/20 1838   03/23/20 5400  Basic metabolic panel  ONCE - STAT,   STAT        03/23/20 1836   03/23/20 1614  Potassium  Now then every 4 hours,   STAT     Comments: Until normal twice.    03/23/20 1615   03/23/20 1614  Na and K (sodium & potassium), rand urine  ONCE - STAT,   STAT        03/23/20 1615   03/23/20 1457  HIV Antibody (routine testing w rflx)  (HIV Antibody (Routine testing w reflex) panel)  Once,   STAT        03/23/20 1457   03/23/20 1457  CBC  (enoxaparin (LOVENOX)    CrCl >/= 30 ml/min)  Once,   STAT       Comments:  Baseline for enoxaparin therapy IF NOT ALREADY DRAWN.  Notify MD if PLT < 100 K.    03/23/20 1457   03/23/20 1457  Creatinine, serum  (enoxaparin (LOVENOX)    CrCl >/= 30 ml/min)  Once,   STAT       Comments: Baseline for enoxaparin therapy IF NOT ALREADY DRAWN.    03/23/20 1457   03/23/20 1457  Culture, sputum-assessment  Once,   R        03/23/20 1457   03/23/20 1457  Legionella Pneumophila Serogp 1 Ur Ag  Once,   STAT        03/23/20 1457   03/23/20 1457  Strep pneumoniae urinary antigen  Once,   STAT        03/23/20 1457   03/23/20 1417  Lactic acid, plasma  STAT Now then every 3 hours,   R (with STAT occurrences)      03/23/20 1416   03/23/20 1415  Hemoglobin A1c  Once,   STAT       Comments: To assess prior glycemic control    03/23/20 1415          Vitals/Pain Today's Vitals   03/23/20 1729 03/23/20 1757 03/23/20 1829 03/23/20 1830  BP: (!) 112/60 (!) 143/85 (!) 143/85 (!) 121/59  Pulse: 104 (!) 118 (!) 113 (!) 112  Resp: 23 (!) 24 23 (!) 25  Temp: 98.5 F (36.9 C) 98.5 F (36.9 C) 98.5 F (36.9 C)   TempSrc: Oral Oral Oral   SpO2: 91% 91% 92% 91%  PainSc:        Isolation Precautions No active isolations  Medications Medications  sodium chloride 0.9 % bolus 500 mL (0 mLs Intravenous Stopped 03/23/20 1135)    Followed by  0.9 %  sodium chloride infusion (0 mLs  Intravenous Stopped 03/23/20 1555)  enoxaparin (LOVENOX) injection 40 mg (has no administration in time range)  0.9 %  sodium chloride infusion ( Intravenous Rate/Dose Change 03/23/20 1555)  cefTRIAXone (ROCEPHIN) 2 g in sodium chloride 0.9 % 100 mL IVPB (has no administration in time range)  azithromycin (ZITHROMAX) 500 mg in sodium chloride 0.9 % 250 mL IVPB (has no administration in time range)  insulin aspart (novoLOG) injection 0-15 Units (5 Units Subcutaneous Given 03/23/20 1652)  predniSONE (DELTASONE) tablet 20 mg (has no administration in time range)  ipratropium-albuterol (DUONEB) 0.5-2.5 (3)  MG/3ML nebulizer solution 3 mL (3 mLs Nebulization Given 03/23/20 1556)  albuterol (PROVENTIL) (2.5 MG/3ML) 0.083% nebulizer solution 2.5 mg (has no administration in time range)  guaiFENesin-dextromethorphan (ROBITUSSIN DM) 100-10 MG/5ML syrup 5 mL (has no administration in time range)  aspirin chewable tablet 81 mg (has no administration in time range)  atorvastatin (LIPITOR) tablet 10 mg (10 mg Oral Given 03/23/20 1556)  escitalopram (LEXAPRO) tablet 20 mg (20 mg Oral Given 03/23/20 1556)  insulin detemir (LEVEMIR) injection 40 Units (40 Units Subcutaneous Given 03/23/20 1557)  pantoprazole (PROTONIX) EC tablet 40 mg (40 mg Oral Given 03/23/20 1556)  vitamin B-12 (CYANOCOBALAMIN) tablet 1,000 mcg (has no administration in time range)  albuterol (VENTOLIN HFA) 108 (90 Base) MCG/ACT inhaler 4 puff (4 puffs Inhalation Given 03/23/20 1044)  dexamethasone (DECADRON) injection 10 mg (10 mg Intravenous Given 03/23/20 1044)  cefTRIAXone (ROCEPHIN) 1 g in sodium chloride 0.9 % 100 mL IVPB (0 g Intravenous Stopped 03/23/20 1302)  azithromycin (ZITHROMAX) tablet 500 mg (500 mg Oral Given 03/23/20 1233)  sodium chloride 0.9 % bolus 2,000 mL (0 mLs Intravenous Stopped 03/23/20 1833)  sodium zirconium cyclosilicate (LOKELMA) packet 10 g (10 g Oral Given 03/23/20 1656)  sodium chloride 0.9 % bolus 1,000 mL (0 mLs Intravenous Stopped 03/23/20 1834)    Mobility walks

## 2020-03-23 NOTE — ED Triage Notes (Signed)
Patient was seen at [facility] center for infusion, arrived there 82% on RA requiring 3L o2 via  at this time. Pt tachy 132. Cancer center reports AKI per labs drawn this morning.

## 2020-03-24 DIAGNOSIS — D519 Vitamin B12 deficiency anemia, unspecified: Secondary | ICD-10-CM

## 2020-03-24 DIAGNOSIS — J189 Pneumonia, unspecified organism: Principal | ICD-10-CM

## 2020-03-24 DIAGNOSIS — N179 Acute kidney failure, unspecified: Secondary | ICD-10-CM

## 2020-03-24 LAB — CBC WITH DIFFERENTIAL/PLATELET
Abs Immature Granulocytes: 0.6 10*3/uL — ABNORMAL HIGH (ref 0.00–0.07)
Basophils Absolute: 0.1 10*3/uL (ref 0.0–0.1)
Basophils Relative: 0 %
Eosinophils Absolute: 0 10*3/uL (ref 0.0–0.5)
Eosinophils Relative: 0 %
HCT: 33.9 % — ABNORMAL LOW (ref 39.0–52.0)
Hemoglobin: 10.1 g/dL — ABNORMAL LOW (ref 13.0–17.0)
Immature Granulocytes: 3 %
Lymphocytes Relative: 4 %
Lymphs Abs: 0.7 10*3/uL (ref 0.7–4.0)
MCH: 26.9 pg (ref 26.0–34.0)
MCHC: 29.8 g/dL — ABNORMAL LOW (ref 30.0–36.0)
MCV: 90.4 fL (ref 80.0–100.0)
Monocytes Absolute: 0.9 10*3/uL (ref 0.1–1.0)
Monocytes Relative: 4 %
Neutro Abs: 17.7 10*3/uL — ABNORMAL HIGH (ref 1.7–7.7)
Neutrophils Relative %: 89 %
Platelets: 251 10*3/uL (ref 150–400)
RBC: 3.75 MIL/uL — ABNORMAL LOW (ref 4.22–5.81)
RDW: 16.3 % — ABNORMAL HIGH (ref 11.5–15.5)
WBC: 20 10*3/uL — ABNORMAL HIGH (ref 4.0–10.5)
nRBC: 0 % (ref 0.0–0.2)

## 2020-03-24 LAB — COMPREHENSIVE METABOLIC PANEL
ALT: 21 U/L (ref 0–44)
AST: 18 U/L (ref 15–41)
Albumin: 2.8 g/dL — ABNORMAL LOW (ref 3.5–5.0)
Alkaline Phosphatase: 69 U/L (ref 38–126)
Anion gap: 9 (ref 5–15)
BUN: 37 mg/dL — ABNORMAL HIGH (ref 6–20)
CO2: 20 mmol/L — ABNORMAL LOW (ref 22–32)
Calcium: 8.3 mg/dL — ABNORMAL LOW (ref 8.9–10.3)
Chloride: 106 mmol/L (ref 98–111)
Creatinine, Ser: 1.42 mg/dL — ABNORMAL HIGH (ref 0.61–1.24)
GFR calc Af Amer: >60 mL/min (ref >60)
GFR calc non Af Amer: 57 mL/min — ABNORMAL LOW (ref >60)
Glucose, Bld: 270 mg/dL — ABNORMAL HIGH (ref 70–99)
Potassium: 5.3 mmol/L — ABNORMAL HIGH (ref 3.5–5.1)
Sodium: 135 mmol/L (ref 135–145)
Total Bilirubin: 0.2 mg/dL — ABNORMAL LOW (ref 0.3–1.2)
Total Protein: 6.8 g/dL (ref 6.5–8.1)

## 2020-03-24 LAB — GLUCOSE, CAPILLARY
Glucose-Capillary: 264 mg/dL — ABNORMAL HIGH (ref 70–99)
Glucose-Capillary: 272 mg/dL — ABNORMAL HIGH (ref 70–99)
Glucose-Capillary: 330 mg/dL — ABNORMAL HIGH (ref 70–99)
Glucose-Capillary: 243 mg/dL — ABNORMAL HIGH (ref 70–99)

## 2020-03-24 LAB — HIV ANTIBODY (ROUTINE TESTING W REFLEX): HIV Screen 4th Generation wRfx: NONREACTIVE

## 2020-03-24 LAB — POTASSIUM
Potassium: 5.3 mmol/L — ABNORMAL HIGH (ref 3.5–5.1)
Potassium: 5.4 mmol/L — ABNORMAL HIGH (ref 3.5–5.1)
Potassium: 5.3 mmol/L — ABNORMAL HIGH (ref 3.5–5.1)

## 2020-03-24 MED ORDER — INSULIN ASPART 100 UNIT/ML ~~LOC~~ SOLN
3.0000 [IU] | Freq: Three times a day (TID) | SUBCUTANEOUS | Status: DC
  Administered 2020-03-25 – 2020-03-26 (×5): 3 [IU] via SUBCUTANEOUS

## 2020-03-24 MED ORDER — ENOXAPARIN SODIUM 60 MG/0.6ML ~~LOC~~ SOLN
50.0000 mg | SUBCUTANEOUS | Status: DC
  Administered 2020-03-24 – 2020-03-25 (×2): 50 mg via SUBCUTANEOUS
  Filled 2020-03-24 (×2): qty 0.6

## 2020-03-24 MED ORDER — SODIUM ZIRCONIUM CYCLOSILICATE 10 G PO PACK
10.0000 g | PACK | Freq: Two times a day (BID) | ORAL | Status: DC
  Administered 2020-03-24 (×2): 10 g via ORAL
  Filled 2020-03-24 (×3): qty 1

## 2020-03-24 MED ORDER — INSULIN ASPART 100 UNIT/ML ~~LOC~~ SOLN
0.0000 [IU] | Freq: Every day | SUBCUTANEOUS | Status: DC
  Administered 2020-03-24: 2 [IU] via SUBCUTANEOUS
  Administered 2020-03-25: 3 [IU] via SUBCUTANEOUS

## 2020-03-24 MED ORDER — INSULIN ASPART 100 UNIT/ML ~~LOC~~ SOLN
0.0000 [IU] | Freq: Three times a day (TID) | SUBCUTANEOUS | Status: DC
  Administered 2020-03-25: 11 [IU] via SUBCUTANEOUS
  Administered 2020-03-25: 4 [IU] via SUBCUTANEOUS
  Administered 2020-03-25 – 2020-03-26 (×2): 3 [IU] via SUBCUTANEOUS

## 2020-03-24 NOTE — Progress Notes (Signed)
PROGRESS NOTE    Larry Lam  ZTI:[id number] DOB: Dec 30, 1970 DOA: 03/23/2020 PCP: [name], MD   Chief Complaint  Patient presents with  . Shortness of Breath  . Abnormal Lab  . Tachycardia    Brief Narrative:  49 year old gentleman prior history of CLL followed by oncology clinic, diabetes mellitus, COPD, seizures, hypertension sent from oncology office for tachycardia and hypoxia.  On arrival he was found to be hypoxic requiring up to 3 L of nasal cannula oxygen.  Chest x-ray x-ray showed left lower consolidation concerning for pneumonia.  He was admitted for IV antibiotics. Patient seen and examined at bedside he is still requiring up to 3 L of nasal cannula oxygen to keep sats greater than 90%.  Reports his shortness of breath has improved but not resolved yet.  Assessment & Plan:   Principal Problem:   LLL pneumonia Active Problems:   Seizures ([place])   Hypertension   Leucocytosis   DM (diabetes mellitus) ([place])   Chronic obstructive pulmonary disease (HCC)   CLL (chronic lymphocytic leukemia) (HCC)   Anemia due to vitamin B12 deficiency   Pneumonia  Acute respiratory failure with hypoxia secondary to left lower lobe pneumonia. Continue with IV Rocephin and Zithromax.  Urine for strep antigen and Legionella antigen are pending.  Sputum cultures ordered. Continue with [place] oxygen to keep sats greater than 90%.     Acute mild COPD exacerbation Oral prednisone and continue with duo nebs.   AKI superimposed on stage IIIa CKD Hold lisinopril gently hydrated and repeat renal parameters have improved.    Hyperkalemia probably secondary to lisinopril.  Improved with IV fluids and Lokelma.  Currently potassium is around 5.3.  Repeat Lokelma ordered an EKG does not show any acute changes.  And patient denies any chest pain at this time.     Essential hypertension Blood pressure parameters are well controlled.    Diabetes mellitus Uncontrolled with  hyperglycemia Secondary to steroids. Start the patient on resistance SSI, continue with Levemir 40 units daily and start the patient on 3 units of NovoLog 3 times daily AC. Get hemoglobin A1c.    Leukocytosis Probably secondary to the CLL Follow-up with Dr. Julien Nordmann as an outpatient.    Continue secondary to vitamin B12 deficiency Continue with vitamin B 12 supplementation at this time.       DVT prophylaxis:  Code Status: (Full code Family Communication:none at bedside.  Disposition:   Status is: Inpatient  Remains inpatient appropriate because:IV treatments appropriate due to intensity of illness or inability to take PO   Dispo: The patient is from: Home              Anticipated d/c is to: pending.               Anticipated d/c date is: 2 days              Patient currently is not medically stable to d/c.       Consultants:   None.    Procedures: none.   Antimicrobials:  Anti-infectives (From admission, onward)   Start     Dose/Rate Route Frequency Ordered Stop   03/24/20 1000  cefTRIAXone (ROCEPHIN) 2 g in sodium chloride 0.9 % 100 mL IVPB     Discontinue     2 g 200 mL/hr over 30 Minutes Intravenous Every 24 hours 03/23/20 1457 03/29/20 0959   03/24/20 1000  azithromycin (ZITHROMAX) 500 mg in sodium chloride 0.9 % 250 mL IVPB  Discontinue     500 mg 250 mL/hr over 60 Minutes Intravenous Every 24 hours 03/23/20 1457 03/29/20 0959   03/23/20 1215  cefTRIAXone (ROCEPHIN) 1 g in sodium chloride 0.9 % 100 mL IVPB        1 g 200 mL/hr over 30 Minutes Intravenous  Once 03/23/20 1209 03/23/20 1302   03/23/20 1215  azithromycin (ZITHROMAX) tablet 500 mg        500 mg Oral  Once 03/23/20 1209 03/23/20 1233       Subjective: Breathing has improved.   Objective: Vitals:   03/24/20 0641 03/24/20 0808 03/24/20 1409 03/24/20 1522  BP: (!) 154/72  (!) 147/68   Pulse: 100  104   Resp: (!) 28  18   Temp: 97.6 F (36.4 C)  97.9 F (36.6 C)   TempSrc:  Oral  Oral   SpO2: 93% 93% 95% 93%    Intake/Output Summary (Last 24 hours) at 03/24/2020 1611 Last data filed at 03/24/2020 1600 Gross per 24 hour  Intake 6259.58 ml  Output 1600 ml  Net 4659.58 ml   There were no vitals filed for this visit.  Examination:  General exam: mild distress from SOB.  Respiratory system:  SCATTERED wheezing heard posteriorly, on 3 lit of [place] oxygen. Tachypnea present.  Cardiovascular system: S1 & S2 heard, TACHYCARDIC.Marland Kitchen No pedal edema. Gastrointestinal system: Abdomen is nondistended, soft and nontendert. Normal bowel sounds heard. Central nervous system: Alert and oriented. No focal neurological deficits. Extremities: Symmetric 5 x 5 power. Skin: No rashes, lesions or ulcers Psychiatry:  Mood & affect appropriate.     Data Reviewed: I have personally reviewed following labs and imaging studies  CBC: Recent Labs  Lab 03/23/20 0832 03/23/20 1825 03/24/20 0425  WBC 25.7* 22.5* 20.0*  NEUTROABS 21.0*  --  17.7*  HGB 10.6* 10.0* 10.1*  HCT 34.6* 33.3* 33.9*  MCV 87.4 89.8 90.4  PLT 279 267 431    Basic Metabolic Panel: Recent Labs  Lab 03/23/20 0832 03/23/20 0832 03/23/20 1433 03/23/20 1433 03/23/20 1825 03/23/20 2025 03/24/20 0020 03/24/20 0425 03/24/20 0814 03/24/20 1203  NA 134*  --  135  --   --  136  --  135  --   --   K 5.2*   < > 6.0*   < >  --  5.7* 5.3* 5.3* 5.4* 5.3*  CL 98  --  98  --   --  104  --  106  --   --   CO2 21*  --  22  --   --  21*  --  20*  --   --   GLUCOSE 239*  --  216*  --   --  277*  --  270*  --   --   BUN 29*  --  35*  --   --  38*  --  37*  --   --   CREATININE 2.33*  --  2.08*  --  1.82* 1.76*  --  1.42*  --   --   CALCIUM 10.6*  --  8.9  --   --  8.2*  --  8.3*  --   --    < > = values in this interval not displayed.    GFR: Estimated Creatinine Clearance: 75.5 mL/min (A) (by C-G formula based on SCr of 1.42 mg/dL (H)).  Liver Function Tests: Recent Labs  Lab 03/23/20 0832 03/24/20 0425    AST 14* 18  ALT 15  21  ALKPHOS 78 69  BILITOT 0.6 0.2*  PROT 6.9 6.8  ALBUMIN 2.5* 2.8*    CBG: Recent Labs  Lab 03/23/20 1553 03/23/20 1834 03/24/20 0846 03/24/20 1147  GLUCAP 217* 274* 264* 272*     Recent Results (from the past 240 hour(s))  SARS Coronavirus 2 by RT PCR (hospital order, performed in [facility] hospital lab) Nasopharyngeal Nasopharyngeal Swab     Status: None   Collection Time: 03/23/20 10:26 AM   Specimen: Nasopharyngeal Swab  Result Value Ref Range Status   SARS Coronavirus 2 NEGATIVE NEGATIVE Final    Comment: (NOTE) SARS-CoV-2 target nucleic acids are NOT DETECTED.  The SARS-CoV-2 RNA is generally detectable in upper and lower respiratory specimens during the acute phase of infection. The lowest concentration of SARS-CoV-2 viral copies this assay can detect is 250 copies / mL. A negative result does not preclude SARS-CoV-2 infection and should not be used as the sole basis for treatment or other patient management decisions.  A negative result may occur with improper specimen collection / handling, submission of specimen other than nasopharyngeal swab, presence of viral mutation(s) within the areas targeted by this assay, and inadequate number of viral copies (<250 copies / mL). A negative result must be combined with clinical observations, patient history, and epidemiological information.  Fact Sheet for Patients:   StrictlyIdeas.no  Fact Sheet for Healthcare Providers: BankingDealers.co.za  This test is not yet approved or  cleared by the [place] FDA and has been authorized for detection and/or diagnosis of SARS-CoV-2 by FDA under an Emergency Use Authorization (EUA).  This EUA will remain in effect (meaning this test can be used) for the duration of the COVID-19 declaration under Section 564(b)(1) of the Act, 21 U.S.C. section 360bbb-3(b)(1), unless the authorization is terminated  or revoked sooner.  Performed at [facility], [place] [address] [place], [place] [address]          Radiology Studies: DG Chest Portable 1 View  Result Date: 03/23/2020 CLINICAL DATA:  Worsening shortness of breath, hypoxia. History of leukemia EXAM: PORTABLE CHEST 1 VIEW COMPARISON:  01/21/2020 FINDINGS: The heart size and mediastinal contours are within normal limits. Interval development of dense airspace consolidation within the left lung base and left mid lung. Small left pleural effusion. Right lung is clear. No pneumothorax. IMPRESSION: 1. Interval development of dense airspace consolidation within the left lung base and left mid lung, concerning for pneumonia. 2. Small left pleural effusion. Electronically Signed   By: Davina Poke D.O.   On: 03/23/2020 11:04        Scheduled Meds: . aspirin  81 mg Oral Daily  . atorvastatin  10 mg Oral Daily  . enoxaparin (LOVENOX) injection  50 mg Subcutaneous Q24H  . escitalopram  20 mg Oral Daily  . insulin aspart  0-15 Units Subcutaneous TID WC  . insulin detemir  40 Units Subcutaneous Daily  . ipratropium-albuterol  3 mL Nebulization Q6H  . pantoprazole  40 mg Oral Daily  . predniSONE  20 mg Oral Q breakfast  . sodium zirconium cyclosilicate  10 g Oral BID  . vitamin B-12  1,000 mcg Oral Daily   Continuous Infusions: . sodium chloride Stopped (03/23/20 1555)  . sodium chloride 75 mL/hr at 03/24/20 0959  . azithromycin 500 mg (03/24/20 0951)  . cefTRIAXone (ROCEPHIN)  IV 2 g (03/24/20 0917)     LOS: 1 day        Hosie Poisson, MD [facility]  To contact the attending provider between 7A-7P or the covering provider during after hours 7P-7A, please log into the web site [email or web address] and access using universal [place] password for that web site. If you do not have the password, please call the hospital operator.  03/24/2020, 4:11 PM

## 2020-03-25 LAB — CBC
HCT: 35.5 % — ABNORMAL LOW (ref 39.0–52.0)
Hemoglobin: 10.4 g/dL — ABNORMAL LOW (ref 13.0–17.0)
MCH: 26.3 pg (ref 26.0–34.0)
MCHC: 29.3 g/dL — ABNORMAL LOW (ref 30.0–36.0)
MCV: 89.9 fL (ref 80.0–100.0)
Platelets: 237 10*3/uL (ref 150–400)
RBC: 3.95 MIL/uL — ABNORMAL LOW (ref 4.22–5.81)
RDW: 16.2 % — ABNORMAL HIGH (ref 11.5–15.5)
WBC: 16.3 10*3/uL — ABNORMAL HIGH (ref 4.0–10.5)
nRBC: 0 % (ref 0.0–0.2)

## 2020-03-25 LAB — BASIC METABOLIC PANEL
Anion gap: 10 (ref 5–15)
BUN: 26 mg/dL — ABNORMAL HIGH (ref 6–20)
CO2: 24 mmol/L (ref 22–32)
Calcium: 9.1 mg/dL (ref 8.9–10.3)
Chloride: 104 mmol/L (ref 98–111)
Creatinine, Ser: 0.81 mg/dL (ref 0.61–1.24)
GFR calc Af Amer: >60 mL/min (ref >60)
GFR calc non Af Amer: >60 mL/min (ref >60)
Glucose, Bld: 163 mg/dL — ABNORMAL HIGH (ref 70–99)
Potassium: 4.5 mmol/L (ref 3.5–5.1)
Sodium: 138 mmol/L (ref 135–145)

## 2020-03-25 LAB — EXPECTORATED SPUTUM ASSESSMENT W GRAM STAIN, RFLX TO RESP C

## 2020-03-25 LAB — GLUCOSE, CAPILLARY
Glucose-Capillary: 146 mg/dL — ABNORMAL HIGH (ref 70–99)
Glucose-Capillary: 197 mg/dL — ABNORMAL HIGH (ref 70–99)
Glucose-Capillary: 267 mg/dL — ABNORMAL HIGH (ref 70–99)
Glucose-Capillary: 286 mg/dL — ABNORMAL HIGH (ref 70–99)

## 2020-03-25 LAB — STREP PNEUMONIAE URINARY ANTIGEN: Strep Pneumo Urinary Antigen: NEGATIVE

## 2020-03-25 MED ORDER — INSULIN DETEMIR 100 UNIT/ML ~~LOC~~ SOLN
46.0000 [IU] | Freq: Every day | SUBCUTANEOUS | Status: DC
  Administered 2020-03-25 – 2020-03-26 (×2): 46 [IU] via SUBCUTANEOUS
  Filled 2020-03-25 (×2): qty 0.46

## 2020-03-25 NOTE — Evaluation (Signed)
Physical Therapy Evaluation Patient Details Name: Larry Lam MRN: [id number] DOB: Aug 02, 1971 Today's Date: 03/25/2020   History of Present Illness  49 year old gentleman prior history of CLL followed by oncology clinic, diabetes mellitus, COPD, seizures, hypertension sent from oncology office for tachycardia and hypoxia.  On arrival he was found to be hypoxic requiring up to 3 L of nasal cannula oxygen.  Chest x-ray x-ray showed left lower consolidation concerning for pneumonia.  He was admitted for IV antibiotics.  Clinical Impression  Pt stated he had gotten stronger after his previous hospitalization, however once this pneumonia and chemo set in, he started feeling bad again. Patient strong and safe on his feet, however limited tolerance to minimial activity due to cadriopulmonary capabilities at this time. Educated with some simple home exercises to help with his progression, as well as how to montior small bouts of exercise instead of large amounts to progress.  Educated with pursed lip breathing exercises for perfusion, at rest at 3L sitting and talking patient o2 sats were 92%, but with minimal standing walking in room for 10 feet immedicately dropped to 85-86% with labored breathing and recovered after 3-4 minutes of pursed lip breathing all on 3 L oxygen.   SATURATION QUALIFICATIONS: (This note is used to comply with regulatory documentation for home oxygen)  Pt has been using 2 L of oxygen at home, issued through [facility]. Recommend pt be assessed by [employer] for a portable Oxxygen as well.   Patient Saturations on Room Air at Rest = not tested due to pt dropping with 3 L with minimal activity in room   Patient Saturations on Room Air while Ambulating = ( see above)   Patient Saturations on 3 Liters of oxygen while Ambulating ( only 10 feet)  = 85%  Please briefly explain why patient needs home oxygen: Pt oxygen drops quickly and significantly with little ambulation and mobility  and takes 3-4 minutes to recover even on 3 liters at this time.     Follow Up Recommendations  (pt already has Oxygen at home with [employer]. Can he get an portable O2 from [employer] to assess for mobility?)    Equipment Recommendations  None recommended by PT    Recommendations for Other Services       Precautions / Restrictions        Mobility  Bed Mobility Overal bed mobility: Independent                Transfers Overall transfer level: Independent                  Ambulation/Gait Ambulation/Gait assistance: Independent (just limited by labored breathing, coughing and oxygen dropping) Gait Distance (Feet): 10 Feet   Gait Pattern/deviations: Step-through pattern     General Gait Details: normal gait pattern , no difficulty with the mobility piece of ambulation just the cadiopulmonary piece of it  Stairs            Wheelchair Mobility    Modified Rankin (Stroke Patients Only)       Balance Overall balance assessment: Independent;No apparent balance deficits (not formally assessed)                                           Pertinent Vitals/Pain Pain Assessment: No/denies pain    Home Living Family/patient expects to be discharged to:: Private residence Living Arrangements: Spouse/significant other;Other relatives  Available Help at Discharge: Family;Available 24 hours/day Type of Home: House Home Access: Stairs to enter Entrance Stairs-Rails: None Entrance Stairs-Number of Steps: 1 Home Layout: One level Home Equipment: Walker - 2 wheels      Prior Function Level of Independence: Independent               Hand Dominance        Extremity/Trunk Assessment        Lower Extremity Assessment Lower Extremity Assessment: Overall WFL for tasks assessed       Communication   Communication: No difficulties  Cognition Arousal/Alertness: Awake/alert Behavior During Therapy: WFL for tasks assessed/performed Overall  Cognitive Status: Within Functional Limits for tasks assessed                                        General Comments      Exercises Other Exercises Other Exercises: educated and practiced pursed lip breathing technique and why, also knee extension holds in sitting, sit to stand and theraband with UES for cardio and pulmonary strengthening tolearnce. Educated on HEP with these and to porgress slowly with small bouts of exercise to work up things. Theraband given as well   Assessment/Plan    PT Assessment Patent does not need any further PT services  PT Problem List Decreased activity tolerance       PT Treatment Interventions      PT Goals (Current goals can be found in the Care Plan section)  Acute Rehab PT Goals PT Goal Formulation: All assessment and education complete, DC therapy    Frequency     Barriers to discharge        Co-evaluation               AM-PAC PT "6 Clicks" Mobility  Outcome Measure Help needed turning from your back to your side while in a flat bed without using bedrails?: None Help needed moving from lying on your back to sitting on the side of a flat bed without using bedrails?: None Help needed moving to and from a bed to a chair (including a wheelchair)?: None Help needed standing up from a chair using your arms (e.g., wheelchair or bedside chair)?: None Help needed to walk in hospital room?: None Help needed climbing 3-5 steps with a railing? : None 6 Click Score: 24    End of Session Equipment Utilized During Treatment: Oxygen Activity Tolerance: Patient tolerated treatment well;Treatment limited secondary to medical complications (Comment) Patient left: in bed;with call bell/phone within reach Nurse Communication: Mobility status PT Visit Diagnosis: Difficulty in walking, not elsewhere classified (R26.2)    Time: 2423-5361 PT Time Calculation (min) (ACUTE ONLY): 31 min   Charges:   PT Evaluation $PT Eval Low  Complexity: 1 Low PT Treatments $Therapeutic Exercise: 8-22 mins        Larry Lam, PT, MPT Acute Rehabilitation Services Office: [phone] Pager: [phone] 03/25/2020   Larry Lam 03/25/2020, 12:36 PM

## 2020-03-25 NOTE — Progress Notes (Signed)
   03/25/20 1300  Assess: MEWS Score  Temp 97.7 F (36.5 C)  BP (!) 158/78  Pulse Rate (!) 116  Resp 18  Level of Consciousness Alert  SpO2 93 %  O2 Device Nasal Cannula  Patient Activity (if Appropriate) In chair  O2 Flow Rate (L/min) 3 L/min  Assess: MEWS Score  MEWS Temp 0  MEWS Systolic 0  MEWS Pulse 2  MEWS RR 0  MEWS LOC 0  MEWS Score 2  MEWS Score Color Yellow  Assess: if the MEWS score is Yellow or Red  Were vital signs taken at a resting state? Yes  Focused Assessment No change from prior assessment  Early Detection of Sepsis Score *See Row Information* High  MEWS guidelines implemented *See Row Information* No, previously yellow, continue vital signs every 4 hours

## 2020-03-25 NOTE — Progress Notes (Signed)
PROGRESS NOTE    Larry Lam  BLT:[id number] DOB: December 15, 1970 DOA: 03/23/2020 PCP: [name], MD   Chief Complaint  Patient presents with  . Shortness of Breath  . Abnormal Lab  . Tachycardia    Brief Narrative:  49 year old gentleman prior history of CLL followed by oncology clinic, diabetes mellitus, COPD, seizures, hypertension sent from oncology office for tachycardia and hypoxia.  On arrival he was found to be hypoxic requiring up to 3 L of nasal cannula oxygen.  Chest x-ray x-ray showed left lower consolidation concerning for pneumonia.  He was admitted for IV antibiotics. Patient seen and examined at bedside.  Reports his breathing has improved and is down to 2 L of nasal cannula oxygen.  Cough has improved.  But he is not back to baseline yet.    Assessment & Plan:   Principal Problem:   LLL pneumonia Active Problems:   Seizures ([place])   Hypertension   Leucocytosis   DM (diabetes mellitus) ([place])   Chronic obstructive pulmonary disease (H[facility])   CLL (chronic lymphocytic leukemia) (H[facility])   Anemia due to vitamin B12 deficiency   Pneumonia  Acute respiratory failure with hypoxia secondary to left lower lobe pneumonia. Continue with IV Rocephin and Zithromax, for another 24 hours..  Urine for strep antigen is negative and Legionella antigen is pending.  Sputum cultures ordered and pending. Continue with [place] oxygen to keep sats greater than 90%.    Acute mild COPD exacerbation Scattered wheezing heard posteriorly continue with Oral prednisone and continue with duo nebs every 6 hours..   AKI superimposed on stage IIIa CKD Hyperkalemia resolved. Back to baseline with IV fluids.    Hyperkalemia probably secondary to lisinopril.  Improved with IV fluids and Lokelma.  Potassium back to normal.     Essential hypertension Suboptimally controlled will add IV hydralazine as needed    Diabetes mellitus Uncontrolled with hyperglycemia Secondary to  steroids.  hemoglobin A1c is 10 . CBG (last 3)  Recent Labs    03/24/20 1707 03/24/20 2156 03/25/20 0725  GLUCAP 330* 243* 146*   increased the levemir to 46 units from 40 units. Continue with resistant SSI and NovoLog 3 units 3 times daily AC.    Leukocytosis Improving, Probably secondary to the CLL and infection.  Follow-up with Dr. Julien Nordmann as an outpatient.    Continue secondary to vitamin B12 deficiency Continue with vitamin B 12 supplementation at this time.  Hyperlipidemia Continue with the Lipitor.  DVT prophylaxis: Lovenox Code Status: (Full code Family Communication:none at bedside.  Disposition:   Status is: Inpatient  Remains inpatient appropriate because:IV treatments appropriate due to intensity of illness or inability to take PO   Dispo: The patient is from: Home              Anticipated d/c is to: pending.               Anticipated d/c date is: 2 days              Patient currently is not medically stable to d/c.       Consultants:   None.    Procedures: none.   Antimicrobials:  Anti-infectives (From admission, onward)   Start     Dose/Rate Route Frequency Ordered Stop   03/24/20 1000  cefTRIAXone (ROCEPHIN) 2 g in sodium chloride 0.9 % 100 mL IVPB     Discontinue     2 g 200 mL/hr over 30 Minutes Intravenous Every 24 hours 03/23/20  1457 03/29/20 0959   03/24/20 1000  azithromycin (ZITHROMAX) 500 mg in sodium chloride 0.9 % 250 mL IVPB     Discontinue     500 mg 250 mL/hr over 60 Minutes Intravenous Every 24 hours 03/23/20 1457 03/29/20 0959   03/23/20 1215  cefTRIAXone (ROCEPHIN) 1 g in sodium chloride 0.9 % 100 mL IVPB        1 g 200 mL/hr over 30 Minutes Intravenous  Once 03/23/20 1209 03/23/20 1302   03/23/20 1215  azithromycin (ZITHROMAX) tablet 500 mg        500 mg Oral  Once 03/23/20 1209 03/23/20 1233       Subjective: Breathing is improved but patient not back to baseline yet.  Objective: Vitals:   03/24/20 2027  03/25/20 0327 03/25/20 0523 03/25/20 0719  BP: (!) 137/64  (!) 166/74   Pulse: 93  100   Resp: (!) 24  (!) 24   Temp: 97.6 F (36.4 C)  (!) 97.4 F (36.3 C)   TempSrc: Oral  Oral   SpO2: 96% 96% 92% 93%  Weight:      Height:        Intake/Output Summary (Last 24 hours) at 03/25/2020 1031 Last data filed at 03/25/2020 0600 Gross per 24 hour  Intake 4379.58 ml  Output 900 ml  Net 3479.58 ml   Filed Weights   03/24/20 1915  Weight: (!) 110.2 kg    Examination:  General exam: Alert and comfortable not in distress. Respiratory system: Scattered wheezing heard posteriorly, tachypnea present, air entry fair. Cardiovascular system: S1-S2 heard, tachycardic, no pedal edema, no JVD Gastrointestinal system: Abdomen is soft, nontender, nondistended, bowel sounds normal Central nervous system: Alert and oriented, grossly nonfocal Extremities: No pedal edema Skin: No rashes seen Psychiatry: Mood is appropriate   Data Reviewed: I have personally reviewed following labs and imaging studies  CBC: Recent Labs  Lab 03/23/20 0832 03/23/20 1825 03/24/20 0425 03/25/20 0607  WBC 25.7* 22.5* 20.0* 16.3*  NEUTROABS 21.0*  --  17.7*  --   HGB 10.6* 10.0* 10.1* 10.4*  HCT 34.6* 33.3* 33.9* 35.5*  MCV 87.4 89.8 90.4 89.9  PLT 279 267 251 443    Basic Metabolic Panel: Recent Labs  Lab 03/23/20 0832 03/23/20 0832 03/23/20 1433 03/23/20 1433 03/23/20 1825 03/23/20 2025 03/23/20 2025 03/24/20 0020 03/24/20 0425 03/24/20 0814 03/24/20 1203 03/25/20 0607  NA 134*  --  135  --   --  136  --   --  135  --   --  138  K 5.2*   < > 6.0*   < >  --  5.7*   < > 5.3* 5.3* 5.4* 5.3* 4.5  CL 98  --  98  --   --  104  --   --  106  --   --  104  CO2 21*  --  22  --   --  21*  --   --  20*  --   --  24  GLUCOSE 239*  --  216*  --   --  277*  --   --  270*  --   --  163*  BUN 29*  --  35*  --   --  38*  --   --  37*  --   --  26*  CREATININE 2.33*  --  2.08*  --  1.82* 1.76*  --   --  1.42*   --   --  0.81  CALCIUM 10.6*  --  8.9  --   --  8.2*  --   --  8.3*  --   --  9.1   < > = values in this interval not displayed.    GFR: Estimated Creatinine Clearance: 135.6 mL/min (by C-G formula based on SCr of 0.81 mg/dL).  Liver Function Tests: Recent Labs  Lab 03/23/20 0832 03/24/20 0425  AST 14* 18  ALT 15 21  ALKPHOS 78 69  BILITOT 0.6 0.2*  PROT 6.9 6.8  ALBUMIN 2.5* 2.8*    CBG: Recent Labs  Lab 03/24/20 0846 03/24/20 1147 03/24/20 1707 03/24/20 2156 03/25/20 0725  GLUCAP 264* 272* 330* 243* 146*     Recent Results (from the past 240 hour(s))  SARS Coronavirus 2 by RT PCR (hospital order, performed in [facility] hospital lab) Nasopharyngeal Nasopharyngeal Swab     Status: None   Collection Time: 03/23/20 10:26 AM   Specimen: Nasopharyngeal Swab  Result Value Ref Range Status   SARS Coronavirus 2 NEGATIVE NEGATIVE Final    Comment: (NOTE) SARS-CoV-2 target nucleic acids are NOT DETECTED.  The SARS-CoV-2 RNA is generally detectable in upper and lower respiratory specimens during the acute phase of infection. The lowest concentration of SARS-CoV-2 viral copies this assay can detect is 250 copies / mL. A negative result does not preclude SARS-CoV-2 infection and should not be used as the sole basis for treatment or other patient management decisions.  A negative result may occur with improper specimen collection / handling, submission of specimen other than nasopharyngeal swab, presence of viral mutation(s) within the areas targeted by this assay, and inadequate number of viral copies (<250 copies / mL). A negative result must be combined with clinical observations, patient history, and epidemiological information.  Fact Sheet for Patients:   StrictlyIdeas.no  Fact Sheet for Healthcare Providers: BankingDealers.co.za  This test is not yet approved or  cleared by the [place] FDA and has been  authorized for detection and/or diagnosis of SARS-CoV-2 by FDA under an Emergency Use Authorization (EUA).  This EUA will remain in effect (meaning this test can be used) for the duration of the COVID-19 declaration under Section 564(b)(1) of the Act, 21 U.S.C. section 360bbb-3(b)(1), unless the authorization is terminated or revoked sooner.  Performed at [facility], [place] [address], [place], [place] [address]   Culture, sputum-assessment     Status: None   Collection Time: 03/23/20  2:57 PM   Specimen: Sputum  Result Value Ref Range Status   Specimen Description SPUTUM  Final   Special Requests NONE  Final   Sputum evaluation   Final    THIS SPECIMEN IS ACCEPTABLE FOR SPUTUM CULTURE Performed at [facility], [place] [address] [place], [place] [address]    Report Status 03/25/2020 FINAL  Final         Radiology Studies: DG Chest Portable 1 View  Result Date: 03/23/2020 CLINICAL DATA:  Worsening shortness of breath, hypoxia. History of leukemia EXAM: PORTABLE CHEST 1 VIEW COMPARISON:  01/21/2020 FINDINGS: The heart size and mediastinal contours are within normal limits. Interval development of dense airspace consolidation within the left lung base and left mid lung. Small left pleural effusion. Right lung is clear. No pneumothorax. IMPRESSION: 1. Interval development of dense airspace consolidation within the left lung base and left mid lung, concerning for pneumonia. 2. Small left pleural effusion. Electronically Signed   By: Davina Poke D.O.   On: 03/23/2020 11:04  Scheduled Meds: . aspirin  81 mg Oral Daily  . atorvastatin  10 mg Oral Daily  . enoxaparin (LOVENOX) injection  50 mg Subcutaneous Q24H  . escitalopram  20 mg Oral Daily  . insulin aspart  0-20 Units Subcutaneous TID WC  . insulin aspart  0-5 Units Subcutaneous QHS  . insulin aspart  3 Units Subcutaneous TID WC  . insulin detemir  40 Units Subcutaneous Daily   . ipratropium-albuterol  3 mL Nebulization Q6H  . pantoprazole  40 mg Oral Daily  . predniSONE  20 mg Oral Q breakfast  . vitamin B-12  1,000 mcg Oral Daily   Continuous Infusions: . sodium chloride 75 mL/hr at 03/24/20 2200  . azithromycin 500 mg (03/24/20 0951)  . cefTRIAXone (ROCEPHIN)  IV 2 g (03/24/20 0917)     LOS: 2 days        Hosie Poisson, MD [facility]   To contact the attending provider between 7A-7P or the covering provider during after hours 7P-7A, please log into the web site [email or web address] and access using universal [place] password for that web site. If you do not have the password, please call the hospital operator.  03/25/2020, 10:31 AM

## 2020-03-26 DIAGNOSIS — C9110 Chronic lymphocytic leukemia of B-cell type not having achieved remission: Secondary | ICD-10-CM

## 2020-03-26 LAB — GLUCOSE, CAPILLARY
Glucose-Capillary: 96 mg/dL (ref 70–99)
Glucose-Capillary: 142 mg/dL — ABNORMAL HIGH (ref 70–99)

## 2020-03-26 LAB — LEGIONELLA PNEUMOPHILA SEROGP 1 UR AG: L. pneumophila Serogp 1 Ur Ag: NEGATIVE

## 2020-03-26 MED ORDER — FLUTICASONE FUROATE-VILANTEROL 200-25 MCG/INH IN AEPB
1.0000 | INHALATION_SPRAY | Freq: Every day | RESPIRATORY_TRACT | Status: DC
  Filled 2020-03-26: qty 28

## 2020-03-26 MED ORDER — PREDNISONE 20 MG PO TABS: 20.0000 mg | ORAL_TABLET | Freq: Every day | ORAL | 0 refills | Status: DC

## 2020-03-26 MED ORDER — GUAIFENESIN-DM 100-10 MG/5ML PO SYRP: 5.0000 mL | ORAL_SOLUTION | ORAL | 0 refills | Status: DC | PRN

## 2020-03-26 MED ORDER — FLUTICASONE FUROATE-VILANTEROL 200-25 MCG/INH IN AEPB: 1.0000 | INHALATION_SPRAY | Freq: Every day | RESPIRATORY_TRACT | 1 refills | Status: AC

## 2020-03-26 MED ORDER — METOPROLOL TARTRATE 25 MG PO TABS
12.5000 mg | ORAL_TABLET | Freq: Once | ORAL | Status: AC
  Administered 2020-03-26: 12.5 mg via ORAL
  Filled 2020-03-26: qty 1

## 2020-03-26 MED ORDER — AMOXICILLIN-POT CLAVULANATE 875-125 MG PO TABS: 1.0000 | ORAL_TABLET | Freq: Two times a day (BID) | ORAL | 0 refills | Status: AC

## 2020-03-26 NOTE — Progress Notes (Signed)
   03/26/20 1048  Assess: MEWS Score  Temp 97.6 F (36.4 C)  BP (!) 157/82 (Dr Karleen Hampshire aware)  Pulse Rate (!) 121 (Dr Karleen Hampshire aware)  Resp (!) 28  Level of Consciousness Alert  SpO2 95 %  O2 Device Nasal Cannula  O2 Flow Rate (L/min) 2 L/min  Assess: MEWS Score  MEWS Temp 0  MEWS Systolic 0  MEWS Pulse 2  MEWS RR 2  MEWS LOC 0  MEWS Score 4  MEWS Score Color Red  Assess: if the MEWS score is Yellow or Red  Were vital signs taken at a resting state? Yes  Focused Assessment No change from prior assessment  Early Detection of Sepsis Score *See Row Information* High  MEWS guidelines implemented *See Row Information* Yes  Treat  MEWS Interventions Escalated (See documentation below)  Escalate  MEWS: Escalate Red: discuss with charge nurse/RN and provider, consider discussing with RRT  Notify: Charge Nurse/RN  Name of Charge Nurse/RN Notified Sharyn Blitz RN  Date Charge Nurse/RN Notified 03/26/20  Time Charge Nurse/RN Notified 1100  Notify: Provider  Provider Name/Title Dr. Karleen Hampshire  Date Provider Notified 03/26/20  Time Provider Notified 1048  Notification Type Face-to-face  Notification Reason Other (Comment) (pts HR and RR elevated, red mews)  Response See new orders  Date of Provider Response 03/26/20  Time of Provider Response 1100

## 2020-03-26 NOTE — Progress Notes (Signed)
Pt takes trelegy at home. Pt is out of his trelegy at home. RT isnt sure if its a financial reason. RT asked Pt to speak to the doctor

## 2020-03-26 NOTE — Progress Notes (Signed)
   03/26/20 0919  Assess: MEWS Score  Temp (!) 97.5 F (36.4 C)  BP (!) 111/96  Pulse Rate (!) 111  Resp (!) 24  Level of Consciousness Alert  SpO2 93 %  O2 Device Nasal Cannula  O2 Flow Rate (L/min) 2 L/min  Assess: MEWS Score  MEWS Temp 0  MEWS Systolic 0  MEWS Pulse 2  MEWS RR 1  MEWS LOC 0  MEWS Score 3  MEWS Score Color Yellow  Assess: if the MEWS score is Yellow or Red  Were vital signs taken at a resting state? Yes  Focused Assessment No change from prior assessment  Early Detection of Sepsis Score *See Row Information* Low  MEWS guidelines implemented *See Row Information* No, previously yellow, continue vital signs every 4 hours  Notify: Charge Nurse/RN  Name of Charge Nurse/RN Notified Sharyn Blitz, RN  Date Charge Nurse/RN Notified 03/26/20  Time Charge Nurse/RN Notified [phone]

## 2020-03-26 NOTE — Progress Notes (Signed)
All discharge information including medications and follow up appts discussed with patient. IV removed. All personal belongings including glasses, phone, watch, charger gathered and sent with patient. Pt d/c'd with home oxygen tank. No further questions from pt

## 2020-03-27 LAB — CULTURE, RESPIRATORY W GRAM STAIN: Culture: NORMAL

## 2020-03-27 NOTE — Discharge Summary (Signed)
Physician Discharge Summary  Larry Lam QHU:[id number] DOB: Jul 15, 1971 DOA: 03/23/2020  PCP: [name], MD  Admit date: 03/23/2020 Discharge date: 03/26/2020  Admitted From: [facility].  Disposition:  Home.  Recommendations for Outpatient Follow-up:  1. Follow up with PCP in 1-2 weeks 2. Please obtain BMP/CBC in one week Please follow up with pulmonology in one week.   Discharge Condition:stable.  CODE STATUS: full code.  Diet recommendation: Heart Healthy  Brief/Interim Summary: 49 year old gentleman prior history of CLL followed by oncology clinic, diabetes mellitus, COPD, seizures, hypertension sent from oncology office for tachycardia and hypoxia.  On arrival he was found to be hypoxic requiring up to 3 L of nasal cannula oxygen.  Chest x-ray x-ray showed left lower consolidation concerning for pneumonia.  He was admitted for IV antibiotics. Patient seen and examined at bedside.  Reports his breathing has improved and is down to 2 L of nasal cannula oxygen.  Cough has improved.     Discharge Diagnoses:  Principal Problem:   LLL pneumonia Active Problems:   Seizures ([place])   Hypertension   Leucocytosis   DM (diabetes mellitus) ([place])   Chronic obstructive pulmonary disease (HCC)   CLL (chronic lymphocytic leukemia) (HCC)   Anemia due to vitamin B12 deficiency   Pneumonia  Acute respiratory failure with hypoxia secondary to left lower lobe pneumonia. Continue with IV Rocephin and Zithromax, for another 24 hours..  Urine for strep antigen is negative and Legionella antigen is negative. Marland Kitchen  Sputum cultures ordered and normal.. Continue with [place] oxygen to keep sats greater than 90%.    Acute mild COPD exacerbation Improved wheezing, prednisone taper and added trelegy ellipta.    AKI superimposed on stage IIIa CKD Hyperkalemia resolved. Back to baseline with IV fluids.    Hyperkalemia probably secondary to lisinopril.  Improved with IV fluids and Lokelma.   Potassium back to normal.     Essential hypertension Suboptimally controlled. Adjusted meds on discharge.     Diabetes mellitus Uncontrolled with hyperglycemia Secondary to steroids.  hemoglobin A1c is 10 . Resume home meds on discharge.     Leukocytosis Improving, Probably secondary to the CLL and infection.  Follow-up with Dr. Julien Nordmann as an outpatient.    Continue secondary to vitamin B12 deficiency Continue with vitamin B 12 supplementation at this time.  Hyperlipidemia Continue with the Lipitor.    Discharge Instructions  Discharge Instructions    Diet - low sodium heart healthy   Complete by: As directed    Discharge instructions   Complete by: As directed    Follow up with pulmonologist in one week     Allergies as of 03/26/2020      Reactions   Amlodipine Besylate-valsartan Other (See Comments)   Lowered potassium   Hydrochlorothiazide Other (See Comments)   Lowered potassium      Medication List    STOP taking these medications   allopurinol 100 MG tablet Commonly known as: ZYLOPRIM   ibuprofen 200 MG tablet Commonly known as: ADVIL   lisinopril 40 MG tablet Commonly known as: ZESTRIL     TAKE these medications   amoxicillin-clavulanate 875-125 MG tablet Commonly known as: Augmentin Take 1 tablet by mouth every 12 (twelve) hours for 3 days.   ASPIRIN 81 PO Take 81 mg by mouth daily.   atorvastatin 10 MG tablet Commonly known as: LIPITOR Take 10 mg by mouth daily.   Calquence 100 MG capsule Generic drug: acalabrutinib TAKE 1 CAPSULE (100 MG TOTAL) BY MOUTH  2 (TWO) TIMES DAILY. What changed: See the new instructions.   escitalopram 20 MG tablet Commonly known as: LEXAPRO Take 20 mg by mouth daily.   fluticasone furoate-vilanterol 200-25 MCG/INH Aepb Commonly known as: BREO ELLIPTA Inhale 1 puff into the lungs daily.   guaiFENesin-dextromethorphan 100-10 MG/5ML syrup Commonly known as: ROBITUSSIN DM Take  5 mLs by mouth every 4 (four) hours as needed for cough.   insulin detemir 100 UNIT/ML FlexPen Commonly known as: LEVEMIR Inject 30 Units into the skin daily. What changed: how much to take   ipratropium-albuterol 0.5-2.5 (3) MG/3ML Soln Commonly known as: DUONEB Take 3 mLs by nebulization every 6 (six) hours as needed. J44.9 and J44.1   metFORMIN 500 MG 24 hr tablet Commonly known as: GLUCOPHAGE-XR Take 1,000 mg by mouth 2 (two) times daily.   pantoprazole 40 MG tablet Commonly known as: Protonix Take 1 tablet (40 mg total) by mouth daily.   Pen Needles 3/16" 31G X 5 MM Misc Use as directed with insulin pen   predniSONE 20 MG tablet Commonly known as: DELTASONE Take 1 tablet (20 mg total) by mouth daily with breakfast.   vitamin B-12 1000 MCG tablet Commonly known as: CYANOCOBALAMIN Take 1 tablet (1,000 mcg total) by mouth daily.       Follow-up Information    Aycock, Ngwe A, MD. Schedule an appointment as soon as possible for a visit in 1 week(s).   Specialty: Family Medicine Contact information: [address] [place]  [address] [phone]              Allergies  Allergen Reactions  . Amlodipine Besylate-Valsartan Other (See Comments)    Lowered potassium  . Hydrochlorothiazide Other (See Comments)    Lowered potassium    Consultations:  None.    Procedures/Studies: DG Chest Portable 1 View  Result Date: 03/23/2020 CLINICAL DATA:  Worsening shortness of breath, hypoxia. History of leukemia EXAM: PORTABLE CHEST 1 VIEW COMPARISON:  01/21/2020 FINDINGS: The heart size and mediastinal contours are within normal limits. Interval development of dense airspace consolidation within the left lung base and left mid lung. Small left pleural effusion. Right lung is clear. No pneumothorax. IMPRESSION: 1. Interval development of dense airspace consolidation within the left lung base and left mid lung, concerning for pneumonia. 2. Small left pleural  effusion. Electronically Signed   By: Davina Poke D.O.   On: 03/23/2020 11:04      Subjective: No new complaints.   Discharge Exam: Vitals:   03/26/20 1152 03/26/20 1245  BP: (!) 148/79 (!) 161/76  Pulse: 92 103  Resp: 19   Temp: 97.6 F (36.4 C)   SpO2: 93% 94%   Vitals:   03/26/20 0919 03/26/20 1048 03/26/20 1152 03/26/20 1245  BP: (!) 111/96 (!) 157/82 (!) 148/79 (!) 161/76  Pulse: (!) 111 (!) 121 92 103  Resp: (!) 24 (!) 28 19   Temp: (!) 97.5 F (36.4 C) 97.6 F (36.4 C) 97.6 F (36.4 C)   TempSrc: Oral Oral Oral   SpO2: 93% 95% 93% 94%  Weight:      Height:        General: Pt is alert, awake, not in acute distress Cardiovascular: RRR, S1/S2 +, no rubs, no gallops Respiratory: CTA bilaterally, no wheezing, no rhonchi Abdominal: Soft, NT, ND, bowel sounds + Extremities: no edema, no cyanosis    The results of significant diagnostics from this hospitalization (including imaging, microbiology, ancillary and laboratory) are listed below for reference.  Microbiology: Recent Results (from the past 240 hour(s))  SARS Coronavirus 2 by RT PCR (hospital order, performed in [facility] hospital lab) Nasopharyngeal Nasopharyngeal Swab     Status: None   Collection Time: 03/23/20 10:26 AM   Specimen: Nasopharyngeal Swab  Result Value Ref Range Status   SARS Coronavirus 2 NEGATIVE NEGATIVE Final    Comment: (NOTE) SARS-CoV-2 target nucleic acids are NOT DETECTED.  The SARS-CoV-2 RNA is generally detectable in upper and lower respiratory specimens during the acute phase of infection. The lowest concentration of SARS-CoV-2 viral copies this assay can detect is 250 copies / mL. A negative result does not preclude SARS-CoV-2 infection and should not be used as the sole basis for treatment or other patient management decisions.  A negative result may occur with improper specimen collection / handling, submission of specimen other than nasopharyngeal swab,  presence of viral mutation(s) within the areas targeted by this assay, and inadequate number of viral copies (<250 copies / mL). A negative result must be combined with clinical observations, patient history, and epidemiological information.  Fact Sheet for Patients:   StrictlyIdeas.no  Fact Sheet for Healthcare Providers: BankingDealers.co.za  This test is not yet approved or  cleared by the [place] FDA and has been authorized for detection and/or diagnosis of SARS-CoV-2 by FDA under an Emergency Use Authorization (EUA).  This EUA will remain in effect (meaning this test can be used) for the duration of the COVID-19 declaration under Section 564(b)(1) of the Act, 21 U.S.C. section 360bbb-3(b)(1), unless the authorization is terminated or revoked sooner.  Performed at [facility], [place] [address], [place], [place] [address]   Culture, sputum-assessment     Status: None   Collection Time: 03/23/20  2:57 PM   Specimen: Sputum  Result Value Ref Range Status   Specimen Description SPUTUM  Final   Special Requests NONE  Final   Sputum evaluation   Final    THIS SPECIMEN IS ACCEPTABLE FOR SPUTUM CULTURE Performed at [facility], [place] [address] [place], [place] [address]    Report Status 03/25/2020 FINAL  Final  Culture, respiratory     Status: None   Collection Time: 03/23/20  2:57 PM   Specimen: SPU  Result Value Ref Range Status   Specimen Description   Final    SPUTUM Performed at [facility] [address] [place], [place] [address]    Special Requests   Final    NONE Reflexed from [id number] Performed at [facility], [place] [address] [place], [place] [address]    Gram Stain   Final    RARE WBC PRESENT, PREDOMINANTLY PMN RARE GRAM VARIABLE ROD    Culture   Final    RARE Consistent with normal respiratory flora. Performed at [facility], [place] [address], [place], [place] [address]    Report Status 03/27/2020 FINAL  Final     Labs: BNP (last 3 results) Recent Labs    08/05/19 0839 08/09/19 0558 03/23/20 1027  BNP 265.0* 108.1* 32.4   Basic Metabolic Panel: Recent Labs  Lab 03/23/20 0832 03/23/20 0832 03/23/20 1433 03/23/20 1433 03/23/20 1825 03/23/20 2025 03/23/20 2025 03/24/20 0020 03/24/20 0425 03/24/20 0814 03/24/20 1203 03/25/20 0607  NA 134*  --  135  --   --  136  --   --  135  --   --  138  K 5.2*   < > 6.0*   < >  --  5.7*   < > 5.3* 5.3* 5.4* 5.3* 4.5  CL 98  --  98  --   --  104  --   --  106  --   --  104  CO2 21*  --  22  --   --  21*  --   --  20*  --   --  24  GLUCOSE 239*  --  216*  --   --  277*  --   --  270*  --   --  163*  BUN 29*  --  35*  --   --  38*  --   --  37*  --   --  26*  CREATININE 2.33*  --  2.08*  --  1.82* 1.76*  --   --  1.42*  --   --  0.81  CALCIUM 10.6*  --  8.9  --   --  8.2*  --   --  8.3*  --   --  9.1   < > = values in this interval not displayed.   Liver Function Tests: Recent Labs  Lab 03/23/20 0832 03/24/20 0425  AST 14* 18  ALT 15 21  ALKPHOS 78 69  BILITOT 0.6 0.2*  PROT 6.9 6.8  ALBUMIN 2.5* 2.8*   No results for input(s): LIPASE, AMYLASE in the last 168 hours. No results for input(s): AMMONIA in the last 168 hours. CBC: Recent Labs  Lab 03/23/20 0832 03/23/20 1825 03/24/20 0425 03/25/20 0607  WBC 25.7* 22.5* 20.0* 16.3*  NEUTROABS 21.0*  --  17.7*  --   HGB 10.6* 10.0* 10.1* 10.4*  HCT 34.6* 33.3* 33.9* 35.5*  MCV 87.4 89.8 90.4 89.9  PLT 279 267 251 237   Cardiac Enzymes: No results for input(s): CKTOTAL, CKMB, CKMBINDEX, TROPONINI in the last 168 hours. BNP: Invalid input(s): POCBNP CBG: Recent Labs  Lab 03/25/20 1128 03/25/20 1658 03/25/20 2041 03/26/20 0744 03/26/20 1149  GLUCAP 197* 267* 286* 96 142*   D-Dimer No results for input(s): DDIMER in the last 72 hours. Hgb A1c No results for input(s): HGBA1C in  the last 72 hours. Lipid Profile No results for input(s): CHOL, HDL, LDLCALC, TRIG, CHOLHDL, LDLDIRECT in the last 72 hours. Thyroid function studies No results for input(s): TSH, T4TOTAL, T3FREE, THYROIDAB in the last 72 hours.  Invalid input(s): FREET3 Anemia work up No results for input(s): VITAMINB12, FOLATE, FERRITIN, TIBC, IRON, RETICCTPCT in the last 72 hours. Urinalysis    Component Value Date/Time   COLORURINE YELLOW 08/07/2019 1429   APPEARANCEUR CLEAR 08/07/2019 1429   APPEARANCEUR Clear 07/05/2012 0103   LABSPEC 1.023 08/07/2019 1429   LABSPEC 1.033 07/05/2012 0103   PHURINE 5.0 08/07/2019 1429   GLUCOSEU NEGATIVE 08/07/2019 1429   GLUCOSEU >=500 07/05/2012 0103   HGBUR SMALL (A) 08/07/2019 1429   BILIRUBINUR NEGATIVE 08/07/2019 1429   BILIRUBINUR Negative 07/05/2012 0103   KETONESUR 5 (A) 08/07/2019 1429   PROTEINUR 30 (A) 08/07/2019 1429   NITRITE NEGATIVE 08/07/2019 1429   LEUKOCYTESUR NEGATIVE 08/07/2019 1429   LEUKOCYTESUR Negative 07/05/2012 0103   Sepsis Labs Invalid input(s): PROCALCITONIN,  WBC,  LACTICIDVEN Microbiology Recent Results (from the past 240 hour(s))  SARS Coronavirus 2 by RT PCR (hospital order, performed in [place] hospital lab) Nasopharyngeal Nasopharyngeal Swab     Status: None   Collection Time: 03/23/20 10:26 AM   Specimen: Nasopharyngeal Swab  Result Value Ref Range Status   SARS Coronavirus 2 NEGATIVE NEGATIVE Final  Comment: (NOTE) SARS-CoV-2 target nucleic acids are NOT DETECTED.  The SARS-CoV-2 RNA is generally detectable in upper and lower respiratory specimens during the acute phase of infection. The lowest concentration of SARS-CoV-2 viral copies this assay can detect is 250 copies / mL. A negative result does not preclude SARS-CoV-2 infection and should not be used as the sole basis for treatment or other patient management decisions.  A negative result may occur with improper specimen collection / handling,  submission of specimen other than nasopharyngeal swab, presence of viral mutation(s) within the areas targeted by this assay, and inadequate number of viral copies (<250 copies / mL). A negative result must be combined with clinical observations, patient history, and epidemiological information.  Fact Sheet for Patients:   StrictlyIdeas.no  Fact Sheet for Healthcare Providers: BankingDealers.co.za  This test is not yet approved or  cleared by the [place] FDA and has been authorized for detection and/or diagnosis of SARS-CoV-2 by FDA under an Emergency Use Authorization (EUA).  This EUA will remain in effect (meaning this test can be used) for the duration of the COVID-19 declaration under Section 564(b)(1) of the Act, 21 U.S.C. section 360bbb-3(b)(1), unless the authorization is terminated or revoked sooner.  Performed at [facility], [place] [address] [place], [place] [address]   Culture, sputum-assessment     Status: None   Collection Time: 03/23/20  2:57 PM   Specimen: Sputum  Result Value Ref Range Status   Specimen Description SPUTUM  Final   Special Requests NONE  Final   Sputum evaluation   Final    THIS SPECIMEN IS ACCEPTABLE FOR SPUTUM CULTURE Performed at [facility], [place] [address], [place], [place] [address]    Report Status 03/25/2020 FINAL  Final  Culture, respiratory     Status: None   Collection Time: 03/23/20  2:57 PM   Specimen: SPU  Result Value Ref Range Status   Specimen Description   Final    SPUTUM Performed at [place] [address] [place], [place] [address]    Special Requests   Final    NONE Reflexed from [id number] Performed at [facility], [place] [address], [place], [place] [address]    Gram Stain   Final    RARE WBC PRESENT, PREDOMINANTLY PMN RARE GRAM VARIABLE ROD    Culture   Final    RARE Consistent with  normal respiratory flora. Performed at [facility], [place] [address], [place],  [address]    Report Status 03/27/2020 FINAL  Final     Time coordinating discharge: 35 minutes.   SIGNED:   Hosie Poisson, MD  [facility] 03/27/2020, 4:59 PM

## 2020-03-29 ENCOUNTER — Telehealth: Payer: Self-pay | Admitting: Physician Assistant

## 2020-03-29 NOTE — Telephone Encounter (Signed)
Scheduled appt per 8/2 sch msg  - pt is aware of appt  

## 2020-04-02 NOTE — Progress Notes (Signed)
[facility] OFFICE PROGRESS NOTE  Larry Coffin, MD [place] [address]  DIAGNOSIS: CLL/small lymphocytic lymphoma diagnosed in 2010 and has been in observation for several years with no treatment. 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 The cytogenetics were negative for P 17.  PRIOR THERAPY: None  CURRENT THERAPY: Obinutuzumab 1,000 mg IV. First dose on 08/10/2019. He will also begin acalabrutinib 100 mg BID starting on 08/19/2019. Status post8cycles.   INTERVAL HISTORY: Larry Lam 49 y.o. male returns to the clinic today for a follow up visit. The patient came to the clinic on 7/29 for cycle #9. At that appointment, he was hypoxic, tachycardic, had an AKI, and a worsening cough/shortness of breath. He was hospitalized for 03/23/20-03/26/20 and treated for pneumonia.   Since being discharged, he is feeling significantly better. He denies fever, chills, or night sweats. Denies lymphadenopathy. Denies chest pain or hemoptysis. His cough and shortness of breath have completely resolved. He uses albuterol if needed. He wears oxygen via nasal cannula if needed at night. He notes that his BP medications and diabetes medications were discontinued since being discharged from the hospital. He did not check his blood sugar this morning prior to coming to his appointment. He has forms that he needs filled out from his recent hospitalization for his insurance. He needs to do a post hospitalization follow up with his PCP. He denies peripheral neuropathy. He denies nausea, vomiting, diarrhea, or constipation. Denies abdominal bloating or early satiety. He is here for evaluation before starting cycle #9.   MEDICAL HISTORY: Past Medical History:  Diagnosis Date  . Asthma   . cll dx'd 04/2019  . COPD (chronic obstructive pulmonary disease) ([place])   . Diabetes mellitus without complication ([place])   . Hypertension    . Seizures ([place])    3-4 years ago. Single episode.  . Tobacco use     ALLERGIES:  is allergic to amlodipine besylate-valsartan and hydrochlorothiazide.  MEDICATIONS:  Current Outpatient Medications  Medication Sig Dispense Refill  . ASPIRIN 81 PO Take 81 mg by mouth daily.     Marland Kitchen atorvastatin (LIPITOR) 10 MG tablet Take 10 mg by mouth daily.    Marland Kitchen CALQUENCE 100 MG capsule TAKE 1 CAPSULE (100 MG TOTAL) BY MOUTH 2 (TWO) TIMES DAILY. (Patient taking differently: Take 100 mg by mouth 2 (two) times daily. TAKE 1 CAPSULE (100 MG TOTAL) BY MOUTH 2 (TWO) TIMES DAILY.) 60 capsule 2  . escitalopram (LEXAPRO) 20 MG tablet Take 20 mg by mouth daily.    . fluticasone furoate-vilanterol (BREO ELLIPTA) 200-25 MCG/INH AEPB Inhale 1 puff into the lungs daily. 28 each 1  . guaiFENesin-dextromethorphan (ROBITUSSIN DM) 100-10 MG/5ML syrup Take 5 mLs by mouth every 4 (four) hours as needed for cough. 118 mL 0  . Insulin Pen Needle (PEN NEEDLES 3/16") 31G X 5 MM MISC Use as directed with insulin pen 100 each 0  . ipratropium-albuterol (DUONEB) 0.5-2.5 (3) MG/3ML SOLN Take 3 mLs by nebulization every 6 (six) hours as needed. J44.9 and J44.1 360 mL 0  . metFORMIN (GLUCOPHAGE-XR) 500 MG 24 hr tablet Take 1,000 mg by mouth 2 (two) times daily.    . pantoprazole (PROTONIX) 40 MG tablet Take 1 tablet (40 mg total) by mouth daily. 30 tablet 11  . predniSONE (DELTASONE) 20 MG tablet Take 1 tablet (20 mg total) by mouth daily with breakfast. 3 tablet 0  . vitamin B-12 (  CYANOCOBALAMIN) 1000 MCG tablet Take 1 tablet (1,000 mcg total) by mouth daily. 90 tablet 0  . Insulin Detemir (LEVEMIR) 100 UNIT/ML Pen Inject 30 Units into the skin daily. (Patient taking differently: Inject 46 Units into the skin daily. ) 18 mL 0   No current facility-administered medications for this visit.   Facility-Administered Medications Ordered in Other Visits  Medication Dose Route Frequency Provider Last Rate Last Admin  . insulin regular  (NOVOLIN R) 100 units/mL injection 10 Units  10 Units Subcutaneous Once Langdon Crosson L, PA-C        SURGICAL HISTORY: History reviewed. No pertinent surgical history.  REVIEW OF SYSTEMS:   Review of Systems  Constitutional: Negative for appetite change, chills, fatigue, fever and unexpected weight change.  HENT: Negative for mouth sores, nosebleeds, sore throat and trouble swallowing.   Eyes: Negative for eye problems and icterus.  Respiratory: Negative for cough, hemoptysis, shortness of breath and wheezing.   Cardiovascular: Negative for chest pain and leg swelling.  Gastrointestinal: Negative for abdominal pain, constipation, diarrhea, nausea and vomiting.  Genitourinary: Negative for bladder incontinence, difficulty urinating, dysuria, frequency and hematuria.   Musculoskeletal: Negative for back pain, gait problem, neck pain and neck stiffness.  Skin: Negative for itching and rash.  Neurological: Negative for dizziness, extremity weakness, gait problem, headaches, light-headedness and seizures.  Hematological: Negative for adenopathy. Does not bruise/bleed easily.  Psychiatric/Behavioral: Negative for confusion, depression and sleep disturbance. The patient is not nervous/anxious.     PHYSICAL EXAMINATION:  Blood pressure 132/73, pulse (!) 105, resp. rate 18, weight 226 lb 8 oz (102.7 kg), SpO2 97 %.  ECOG PERFORMANCE STATUS: 1 - Symptomatic but completely ambulatory  Physical Exam  Constitutional: Oriented to person, place, and time and well-developed, well-nourished, and in no distress.  HENT:  Head: Normocephalic and atraumatic.  Mouth/Throat: Oropharynx is clear and moist. No oropharyngeal exudate.  Eyes: Conjunctivae are normal. Right eye exhibits no discharge. Left eye exhibits no discharge. No scleral icterus.  Neck: Normal range of motion. Neck supple.  Cardiovascular: Normal rate, regular rhythm, normal heart sounds and intact distal pulses.    Pulmonary/Chest: Effort normal. Some crackles at the base of the left lung. No respiratory distress. No wheezes. No rales.  Abdominal: Soft. Bowel sounds are normal. Exhibits no distension and no mass. There is no tenderness.  Musculoskeletal: Normal range of motion. Exhibits no edema.  Lymphadenopathy:    No cervical adenopathy.  Neurological: Alert and oriented to person, place, and time. Exhibits normal muscle tone. Gait normal. Coordination normal.  Skin: Skin is warm and dry. No rash noted. Not diaphoretic. No erythema. No pallor.  Psychiatric: Mood, memory and judgment normal.  Vitals reviewed.  LABORATORY DATA: Lab Results  Component Value Date   WBC 8.9 04/04/2020   HGB 13.6 04/04/2020   HCT 43.3 04/04/2020   MCV 85.1 04/04/2020   PLT 374 04/04/2020      Chemistry      Component Value Date/Time   NA 131 (L) 04/04/2020 0733   NA 137 07/06/2012 0506   K 5.1 04/04/2020 0733   K 4.0 07/06/2012 0506   CL 95 (L) 04/04/2020 0733   CL 104 07/06/2012 0506   CO2 23 04/04/2020 0733   CO2 25 07/06/2012 0506   BUN 19 04/04/2020 0733   BUN 12 07/06/2012 0506   CREATININE 1.35 (H) 04/04/2020 0733   CREATININE 0.62 07/06/2012 0506      Component Value Date/Time   CALCIUM 10.7 (H) 04/04/2020 1610  CALCIUM 8.6 07/06/2012 0506   ALKPHOS 105 04/04/2020 0733   ALKPHOS 96 07/06/2012 0506   AST 15 04/04/2020 0733   ALT 22 04/04/2020 0733   ALT 30 07/06/2012 0506   BILITOT 0.3 04/04/2020 0733       RADIOGRAPHIC STUDIES:  DG Chest Portable 1 View  Result Date: 03/23/2020 CLINICAL DATA:  Worsening shortness of breath, hypoxia. History of leukemia EXAM: PORTABLE CHEST 1 VIEW COMPARISON:  01/21/2020 FINDINGS: The heart size and mediastinal contours are within normal limits. Interval development of dense airspace consolidation within the left lung base and left mid lung. Small left pleural effusion. Right lung is clear. No pneumothorax. IMPRESSION: 1. Interval development of dense  airspace consolidation within the left lung base and left mid lung, concerning for pneumonia. 2. Small left pleural effusion. Electronically Signed   By: Davina Poke D.O.   On: 03/23/2020 11:04     ASSESSMENT/PLAN:  This is a very pleasant46 year old Caucasian male diagnosed withCLL/small lymphocytic lymphoma diagnosed in 2010 and has been in observation for several years with no treatment. 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 The cytogenetics were negative for P 17.  The patient is currently undergoing treatment withObinutuzumab 1,000 mg IV. He is status post day cycle #8. He tolerated well without any adverse side effects.He has been onacalabrutinib 100 mg BID starting on 08/19/2019.   The patient was recently hospitalized and treated for pneumonia. He is feeling much better at this time.   The patient was seen with Dr. Julien Nordmann. Labs were reviewed. Recommend that he proceed with cycle #9 today as scheduled. Dr. Julien Nordmann also instructed him to resume his Calquence.  We will see him back for a follow up visit in 4 weeks for evaluation before starting cycle #10.   The patient did not take his diabetes medications yet this AM. His blood sugar is elevated. He will receive 10 units of insulin while in the clinic. He was instructed to follow up with his PCP regarding management of his diabetes as well as his anti-hypertensive when he sees her for his post hospitalization follow up. He was instructed to monitor his blood sugar upon returning home today.   Regarding his forms from the hospital. He was instructed to reach out to the financial office for the information that is required for his paperwork.   The patient was advised to call immediately if he has any concerning symptoms in the interval. The patient voices understanding of current disease status and treatment options and is in agreement with the current care plan. All questions  were answered. The patient knows to call the clinic with any problems, questions or concerns. We can certainly see the patient much sooner if necessary  No orders of the defined types were placed in this encounter.   Keva Darty L Fortune Torosian, PA-C 04/04/20  ADDENDUM: Hematology/Oncology Attending: I had a face-to-face encounter with the patient today.  I recommended his care plan.  This is a very pleasant 49 years old white male with history of small lymphocytic lymphoma/CLL diagnosed in 2010 and has been in observation for several years before having significant disease progression in December 2020.  The patient is currently undergoing systemic chemotherapy with obinutuzumab and acalabrutinib since December 2020.  He has been tolerating the treatment well.  His last cycle of the treatment was delayed by 1 week after the patient was admitted to the hospital with pneumonia. He is feeling much better with no concerning complaints.  We will resume his treatment with cycle #9 today as planned. The patient will come back for follow-up visit in 4 weeks for evaluation before starting the next cycle of his treatment. For the hyperglycemia, will give the patient 10 units of regular insulin in the clinic today and he was advised to monitor his blood sugar closely at home and to discuss with his primary care physician for adjustment of his medication. The patient was advised to call immediately if he has any concerning symptoms in the interval.  Disclaimer: This note was dictated with voice recognition software. Similar sounding words can inadvertently be transcribed and may be missed upon review. Eilleen Kempf, MD 04/04/20

## 2020-04-04 ENCOUNTER — Encounter: Payer: Self-pay | Admitting: Physician Assistant

## 2020-04-04 ENCOUNTER — Inpatient Hospital Stay: Payer: Medicare Other | Attending: Physician Assistant | Admitting: Physician Assistant

## 2020-04-04 ENCOUNTER — Inpatient Hospital Stay: Payer: Medicare Other

## 2020-04-04 ENCOUNTER — Other Ambulatory Visit: Payer: Self-pay

## 2020-04-04 VITALS — BP 132/73 | HR 105 | Resp 18 | Wt 226.5 lb

## 2020-04-04 VITALS — BP 106/63 | HR 89 | Temp 98.5°F | Resp 18

## 2020-04-04 DIAGNOSIS — C9110 Chronic lymphocytic leukemia of B-cell type not having achieved remission: Secondary | ICD-10-CM

## 2020-04-04 DIAGNOSIS — E119 Type 2 diabetes mellitus without complications: Secondary | ICD-10-CM | POA: Insufficient documentation

## 2020-04-04 DIAGNOSIS — Z5112 Encounter for antineoplastic immunotherapy: Secondary | ICD-10-CM | POA: Diagnosis present

## 2020-04-04 DIAGNOSIS — Z79899 Other long term (current) drug therapy: Secondary | ICD-10-CM | POA: Diagnosis not present

## 2020-04-04 DIAGNOSIS — J449 Chronic obstructive pulmonary disease, unspecified: Secondary | ICD-10-CM | POA: Diagnosis not present

## 2020-04-04 DIAGNOSIS — Z7951 Long term (current) use of inhaled steroids: Secondary | ICD-10-CM | POA: Diagnosis not present

## 2020-04-04 DIAGNOSIS — E1165 Type 2 diabetes mellitus with hyperglycemia: Secondary | ICD-10-CM | POA: Diagnosis not present

## 2020-04-04 DIAGNOSIS — R739 Hyperglycemia, unspecified: Secondary | ICD-10-CM

## 2020-04-04 DIAGNOSIS — I10 Essential (primary) hypertension: Secondary | ICD-10-CM | POA: Insufficient documentation

## 2020-04-04 DIAGNOSIS — Z7952 Long term (current) use of systemic steroids: Secondary | ICD-10-CM | POA: Diagnosis not present

## 2020-04-04 DIAGNOSIS — Z7982 Long term (current) use of aspirin: Secondary | ICD-10-CM | POA: Insufficient documentation

## 2020-04-04 LAB — CBC WITH DIFFERENTIAL (CANCER CENTER ONLY)
Abs Immature Granulocytes: 0.07 10*3/uL (ref 0.00–0.07)
Basophils Absolute: 0.1 10*3/uL (ref 0.0–0.1)
Basophils Relative: 1 %
Eosinophils Absolute: 0.2 10*3/uL (ref 0.0–0.5)
Eosinophils Relative: 2 %
HCT: 43.3 % (ref 39.0–52.0)
Hemoglobin: 13.6 g/dL (ref 13.0–17.0)
Immature Granulocytes: 1 %
Lymphocytes Relative: 16 %
Lymphs Abs: 1.4 10*3/uL (ref 0.7–4.0)
MCH: 26.7 pg (ref 26.0–34.0)
MCHC: 31.4 g/dL (ref 30.0–36.0)
MCV: 85.1 fL (ref 80.0–100.0)
Monocytes Absolute: 1 10*3/uL (ref 0.1–1.0)
Monocytes Relative: 12 %
Neutro Abs: 6.1 10*3/uL (ref 1.7–7.7)
Neutrophils Relative %: 68 %
Platelet Count: 374 10*3/uL (ref 150–400)
RBC: 5.09 MIL/uL (ref 4.22–5.81)
RDW: 16.2 % — ABNORMAL HIGH (ref 11.5–15.5)
WBC Count: 8.9 10*3/uL (ref 4.0–10.5)
nRBC: 0 % (ref 0.0–0.2)

## 2020-04-04 LAB — CMP (CANCER CENTER ONLY)
ALT: 22 U/L (ref 0–44)
AST: 15 U/L (ref 15–41)
Albumin: 3.5 g/dL (ref 3.5–5.0)
Alkaline Phosphatase: 105 U/L (ref 38–126)
Anion gap: 13 (ref 5–15)
BUN: 19 mg/dL (ref 6–20)
CO2: 23 mmol/L (ref 22–32)
Calcium: 10.7 mg/dL — ABNORMAL HIGH (ref 8.9–10.3)
Chloride: 95 mmol/L — ABNORMAL LOW (ref 98–111)
Creatinine: 1.35 mg/dL — ABNORMAL HIGH (ref 0.61–1.24)
GFR, Est AFR Am: >60 mL/min (ref >60)
GFR, Estimated: >60 mL/min (ref >60)
Glucose, Bld: 399 mg/dL — ABNORMAL HIGH (ref 70–99)
Potassium: 5.1 mmol/L (ref 3.5–5.1)
Sodium: 131 mmol/L — ABNORMAL LOW (ref 135–145)
Total Bilirubin: 0.3 mg/dL (ref 0.3–1.2)
Total Protein: 7.2 g/dL (ref 6.5–8.1)

## 2020-04-04 LAB — URIC ACID: Uric Acid, Serum: 5.1 mg/dL (ref 3.7–8.6)

## 2020-04-04 LAB — LACTATE DEHYDROGENASE: LDH: 206 U/L — ABNORMAL HIGH (ref 98–192)

## 2020-04-04 MED ORDER — INSULIN REGULAR HUMAN 100 UNIT/ML IJ SOLN
INTRAMUSCULAR | Status: AC
  Filled 2020-04-04: qty 1

## 2020-04-04 MED ORDER — INSULIN REGULAR HUMAN 100 UNIT/ML IJ SOLN
10.0000 [IU] | Freq: Once | INTRAMUSCULAR | Status: AC
  Administered 2020-04-04: 10 [IU] via SUBCUTANEOUS

## 2020-04-04 MED ORDER — ACETAMINOPHEN 325 MG PO TABS
650.0000 mg | ORAL_TABLET | Freq: Once | ORAL | Status: AC
  Administered 2020-04-04: 650 mg via ORAL

## 2020-04-04 MED ORDER — SODIUM CHLORIDE 0.9 % IV SOLN
1000.0000 mg | Freq: Once | INTRAVENOUS | Status: AC
  Administered 2020-04-04: 1000 mg via INTRAVENOUS
  Filled 2020-04-04: qty 40

## 2020-04-04 MED ORDER — ACETAMINOPHEN 325 MG PO TABS
ORAL_TABLET | ORAL | Status: AC
  Filled 2020-04-04: qty 2

## 2020-04-04 MED ORDER — DIPHENHYDRAMINE HCL 50 MG/ML IJ SOLN
INTRAMUSCULAR | Status: AC
  Filled 2020-04-04: qty 1

## 2020-04-04 MED ORDER — SODIUM CHLORIDE 0.9 % IV SOLN
Freq: Once | INTRAVENOUS | Status: AC
  Filled 2020-04-04: qty 250

## 2020-04-04 MED ORDER — DIPHENHYDRAMINE HCL 50 MG/ML IJ SOLN
25.0000 mg | Freq: Once | INTRAMUSCULAR | Status: AC
  Administered 2020-04-04: 25 mg via INTRAVENOUS

## 2020-04-04 MED ORDER — SODIUM CHLORIDE 0.9 % IV SOLN
10.0000 mg | Freq: Once | INTRAVENOUS | Status: AC
  Administered 2020-04-04: 10 mg via INTRAVENOUS
  Filled 2020-04-04: qty 1
  Filled 2020-04-04: qty 10

## 2020-04-04 NOTE — Patient Instructions (Signed)
[facility] Discharge Instructions for Patients Receiving Chemotherapy  Today you received the following chemotherapy agents: Gazyva   To help prevent nausea and vomiting after your treatment, we encourage you to take your nausea medication as directed.    If you develop nausea and vomiting that is not controlled by your nausea medication, call the clinic.   BELOW ARE SYMPTOMS THAT SHOULD BE REPORTED IMMEDIATELY:  *FEVER GREATER THAN 100.5 F  *CHILLS WITH OR WITHOUT FEVER  NAUSEA AND VOMITING THAT IS NOT CONTROLLED WITH YOUR NAUSEA MEDICATION  *UNUSUAL SHORTNESS OF BREATH  *UNUSUAL BRUISING OR BLEEDING  TENDERNESS IN MOUTH AND THROAT WITH OR WITHOUT PRESENCE OF ULCERS  *URINARY PROBLEMS  *BOWEL PROBLEMS  UNUSUAL RASH Items with * indicate a potential emergency and should be followed up as soon as possible.  Feel free to call the clinic should you have any questions or concerns. The clinic phone number is [phone].  Please show the CHEMO ALERT CARD at check-in to the Emergency Department and triage nurse.   

## 2020-04-04 NOTE — Progress Notes (Signed)
Per Cassie OK to treat with HR 111

## 2020-04-06 ENCOUNTER — Telehealth: Payer: Self-pay | Admitting: Physician Assistant

## 2020-04-06 NOTE — Telephone Encounter (Signed)
Scheduled per 08/10 los, patient has been called and notified. 

## 2020-04-20 ENCOUNTER — Inpatient Hospital Stay: Payer: Medicare Other | Admitting: Internal Medicine

## 2020-04-20 ENCOUNTER — Ambulatory Visit: Payer: Medicare Other

## 2020-04-20 ENCOUNTER — Other Ambulatory Visit: Payer: Medicare Other

## 2020-04-25 NOTE — Progress Notes (Signed)
[facility] OFFICE PROGRESS NOTE  [name], MD [place] [address]  DIAGNOSIS: CLL/small lymphocytic lymphoma diagnosed in 2010 and has been in observation for several years with no treatment. 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 The cytogenetics were negative for P 17.  PRIOR THERAPY: None  CURRENT THERAPY: Obinutuzumab 1,000 mg IV. First dose on 08/10/2019. He will also begin acalabrutinib 100 mg BID starting on 08/19/2019. Status post9cycles.  INTERVAL HISTORY: Larry Lam 49 y.o. male returns to the clinic today for a follow up visit. The patient is feeling well today without any concerning complaints. He denies fever, chills, or night sweats. Denies lymphadenopathy. Denies chest pain or hemoptysis. He denies cough or shortness of breath. He uses albuterol if needed. He wears oxygen via nasal cannula if needed at night. He denies peripheral neuropathy. He denies nausea, vomiting, diarrhea, or constipation. Denies abdominal bloating or early satiety. He is here for evaluation before starting cycle #10.    MEDICAL HISTORY: Past Medical History:  Diagnosis Date  . Asthma   . cll dx'd 04/2019  . COPD (chronic obstructive pulmonary disease) ([place])   . Diabetes mellitus without complication ([place])   . Hypertension   . Seizures ([place])    3-4 years ago. Single episode.  . Tobacco use     ALLERGIES:  is allergic to amlodipine besylate-valsartan and hydrochlorothiazide.  MEDICATIONS:  Current Outpatient Medications  Medication Sig Dispense Refill  . ASPIRIN 81 PO Take 81 mg by mouth daily.     Marland Kitchen atorvastatin (LIPITOR) 40 MG tablet Take 40 mg by mouth at bedtime.    Marland Kitchen CALQUENCE 100 MG capsule TAKE 1 CAPSULE (100 MG TOTAL) BY MOUTH 2 (TWO) TIMES DAILY. (Patient taking differently: Take 100 mg by mouth 2 (two) times daily. TAKE 1 CAPSULE (100 MG TOTAL) BY MOUTH 2 (TWO) TIMES DAILY.) 60  capsule 2  . escitalopram (LEXAPRO) 20 MG tablet Take 20 mg by mouth daily.    . fluticasone furoate-vilanterol (BREO ELLIPTA) 200-25 MCG/INH AEPB Inhale 1 puff into the lungs daily. 28 each 1  . HUMALOG KWIKPEN 100 UNIT/ML KwikPen Inject 15 Units into the skin as directed.    . Insulin Pen Needle (PEN NEEDLES 3/16") 31G X 5 MM MISC Use as directed with insulin pen 100 each 0  . ipratropium-albuterol (DUONEB) 0.5-2.5 (3) MG/3ML SOLN Take 3 mLs by nebulization every 6 (six) hours as needed. J44.9 and J44.1 360 mL 0  . metFORMIN (GLUCOPHAGE-XR) 500 MG 24 hr tablet Take 1,000 mg by mouth 2 (two) times daily.    . pantoprazole (PROTONIX) 40 MG tablet Take 1 tablet (40 mg total) by mouth daily. 30 tablet 11  . vitamin B-12 (CYANOCOBALAMIN) 1000 MCG tablet Take 1 tablet (1,000 mcg total) by mouth daily. 90 tablet 0  . Insulin Detemir (LEVEMIR) 100 UNIT/ML Pen Inject 30 Units into the skin daily. (Patient taking differently: Inject 46 Units into the skin daily. ) 18 mL 0   No current facility-administered medications for this visit.    SURGICAL HISTORY: No past surgical history on file.  REVIEW OF SYSTEMS:   Review of Systems  Constitutional: Negative for appetite change, chills, fatigue, fever and unexpected weight change.  HENT: Negative for mouth sores, nosebleeds, sore throat and trouble swallowing.   Eyes: Negative for eye problems and icterus.  Respiratory: Negative for cough, hemoptysis, shortness of breath and wheezing.   Cardiovascular: Negative for  chest pain and leg swelling.  Gastrointestinal: Negative for abdominal pain, constipation, diarrhea, nausea and vomiting.  Genitourinary: Negative for bladder incontinence, difficulty urinating, dysuria, frequency and hematuria.   Musculoskeletal: Negative for back pain, gait problem, neck pain and neck stiffness.  Skin: Negative for itching and rash.  Neurological: Negative for dizziness, extremity weakness, gait problem, headaches,  light-headedness and seizures.  Hematological: Negative for adenopathy. Does not bruise/bleed easily.  Psychiatric/Behavioral: Negative for confusion, depression and sleep disturbance. The patient is not nervous/anxious.     PHYSICAL EXAMINATION:  Blood pressure (!) 143/69, pulse (!) 105, temperature (!) 97.5 F (36.4 C), temperature source Tympanic, resp. rate 18, height 5\' 10"  (1.778 m), weight 235 lb (106.6 kg), SpO2 97 %.  ECOG PERFORMANCE STATUS: 1 - Symptomatic but completely ambulatory  Physical Exam  Constitutional: Oriented to person, place, and time and well-developed, well-nourished, and in no distress.  HENT:  Head: Normocephalic and atraumatic.  Mouth/Throat: Oropharynx is clear and moist. No oropharyngeal exudate.  Eyes: Conjunctivae are normal. Right eye exhibits no discharge. Left eye exhibits no discharge. No scleral icterus.  Neck: Normal range of motion. Neck supple.  Cardiovascular: Normal rate, regular rhythm, normal heart sounds and intact distal pulses.   Pulmonary/Chest: Effort normal. No respiratory distress. No wheezes. No rales.  Abdominal: Soft. Bowel sounds are normal. Exhibits no distension and no mass. There is no tenderness.  Musculoskeletal: Normal range of motion. Exhibits no edema.  Lymphadenopathy:    No cervical adenopathy.  Neurological: Alert and oriented to person, place, and time. Exhibits normal muscle tone. Gait normal. Coordination normal.  Skin: Skin is warm and dry. No rash noted. Not diaphoretic. No erythema. No pallor.  Psychiatric: Mood, memory and judgment normal.   LABORATORY DATA: Lab Results  Component Value Date   WBC 10.2 05/02/2020   HGB 14.2 05/02/2020   HCT 45.6 05/02/2020   MCV 85.6 05/02/2020   PLT 175 05/02/2020      Chemistry      Component Value Date/Time   NA 137 05/02/2020 1352   NA 137 07/06/2012 0506   K 5.0 05/02/2020 1352   K 4.0 07/06/2012 0506   CL 103 05/02/2020 1352   CL 104 07/06/2012 0506   CO2  26 05/02/2020 1352   CO2 25 07/06/2012 0506   BUN 14 05/02/2020 1352   BUN 12 07/06/2012 0506   CREATININE 1.11 05/02/2020 1352   CREATININE 0.62 07/06/2012 0506      Component Value Date/Time   CALCIUM 9.7 05/02/2020 1352   CALCIUM 8.6 07/06/2012 0506   ALKPHOS 100 05/02/2020 1352   ALKPHOS 96 07/06/2012 0506   AST 9 (L) 05/02/2020 1352   ALT 14 05/02/2020 1352   ALT 30 07/06/2012 0506   BILITOT 0.3 05/02/2020 1352       RADIOGRAPHIC STUDIES:  No results found.   ASSESSMENT/PLAN:  This is a very pleasant89 year old Caucasian male diagnosed withCLL/small lymphocytic lymphoma diagnosed in 2010 and has been in observation for several years with no treatment. 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 The cytogenetics were negative for P 17.  The patient is currently undergoing treatment withObinutuzumab 1,000 mg IV. He is status post cycle #9.He tolerated well without any adverse side effects.He has been onacalabrutinib 100 mg BID starting on 08/19/2019.   Labs were reviewed. Recommend that he proceed with cycle #10 tomorrow as scheduled.   We will see him back for a follow up visit in 4 weeks for  evaluation before starting cycle #11.   The patient was advised to call immediately if he has any concerning symptoms in the interval. The patient voices understanding of current disease status and treatment options and is in agreement with the current care plan. All questions were answered. The patient knows to call the clinic with any problems, questions or concerns. We can certainly see the patient much sooner if necessary    No orders of the defined types were placed in this encounter.    Quanetta Truss L Trinity Hyland, PA-C 05/02/20

## 2020-05-02 ENCOUNTER — Other Ambulatory Visit: Payer: Self-pay

## 2020-05-02 ENCOUNTER — Other Ambulatory Visit: Payer: Medicare Other

## 2020-05-02 ENCOUNTER — Inpatient Hospital Stay: Payer: Medicare Other | Attending: Physician Assistant | Admitting: Physician Assistant

## 2020-05-02 ENCOUNTER — Inpatient Hospital Stay: Payer: Medicare Other

## 2020-05-02 VITALS — BP 143/69 | HR 105 | Temp 97.5°F | Resp 18 | Ht 70.0 in | Wt 235.0 lb

## 2020-05-02 DIAGNOSIS — Z5112 Encounter for antineoplastic immunotherapy: Secondary | ICD-10-CM | POA: Insufficient documentation

## 2020-05-02 DIAGNOSIS — Z7982 Long term (current) use of aspirin: Secondary | ICD-10-CM | POA: Diagnosis not present

## 2020-05-02 DIAGNOSIS — I10 Essential (primary) hypertension: Secondary | ICD-10-CM | POA: Diagnosis not present

## 2020-05-02 DIAGNOSIS — C9110 Chronic lymphocytic leukemia of B-cell type not having achieved remission: Secondary | ICD-10-CM | POA: Diagnosis present

## 2020-05-02 DIAGNOSIS — Z5111 Encounter for antineoplastic chemotherapy: Secondary | ICD-10-CM | POA: Diagnosis not present

## 2020-05-02 DIAGNOSIS — E119 Type 2 diabetes mellitus without complications: Secondary | ICD-10-CM | POA: Diagnosis not present

## 2020-05-02 DIAGNOSIS — J449 Chronic obstructive pulmonary disease, unspecified: Secondary | ICD-10-CM | POA: Diagnosis not present

## 2020-05-02 DIAGNOSIS — Z79899 Other long term (current) drug therapy: Secondary | ICD-10-CM | POA: Insufficient documentation

## 2020-05-02 LAB — CMP (CANCER CENTER ONLY)
ALT: 14 U/L (ref 0–44)
AST: 9 U/L — ABNORMAL LOW (ref 15–41)
Albumin: 3.8 g/dL (ref 3.5–5.0)
Alkaline Phosphatase: 100 U/L (ref 38–126)
Anion gap: 8 (ref 5–15)
BUN: 14 mg/dL (ref 6–20)
CO2: 26 mmol/L (ref 22–32)
Calcium: 9.7 mg/dL (ref 8.9–10.3)
Chloride: 103 mmol/L (ref 98–111)
Creatinine: 1.11 mg/dL (ref 0.61–1.24)
GFR, Est AFR Am: >60 mL/min (ref >60)
GFR, Estimated: >60 mL/min (ref >60)
Glucose, Bld: 131 mg/dL — ABNORMAL HIGH (ref 70–99)
Potassium: 5 mmol/L (ref 3.5–5.1)
Sodium: 137 mmol/L (ref 135–145)
Total Bilirubin: 0.3 mg/dL (ref 0.3–1.2)
Total Protein: 6.9 g/dL (ref 6.5–8.1)

## 2020-05-02 LAB — CBC WITH DIFFERENTIAL (CANCER CENTER ONLY)
Abs Immature Granulocytes: 0.08 10*3/uL — ABNORMAL HIGH (ref 0.00–0.07)
Basophils Absolute: 0 10*3/uL (ref 0.0–0.1)
Basophils Relative: 0 %
Eosinophils Absolute: 0.1 10*3/uL (ref 0.0–0.5)
Eosinophils Relative: 1 %
HCT: 45.6 % (ref 39.0–52.0)
Hemoglobin: 14.2 g/dL (ref 13.0–17.0)
Immature Granulocytes: 1 %
Lymphocytes Relative: 16 %
Lymphs Abs: 1.6 10*3/uL (ref 0.7–4.0)
MCH: 26.6 pg (ref 26.0–34.0)
MCHC: 31.1 g/dL (ref 30.0–36.0)
MCV: 85.6 fL (ref 80.0–100.0)
Monocytes Absolute: 0.9 10*3/uL (ref 0.1–1.0)
Monocytes Relative: 9 %
Neutro Abs: 7.4 10*3/uL (ref 1.7–7.7)
Neutrophils Relative %: 73 %
Platelet Count: 175 10*3/uL (ref 150–400)
RBC: 5.33 MIL/uL (ref 4.22–5.81)
RDW: 17.2 % — ABNORMAL HIGH (ref 11.5–15.5)
WBC Count: 10.2 10*3/uL (ref 4.0–10.5)
nRBC: 0 % (ref 0.0–0.2)

## 2020-05-02 LAB — URIC ACID: Uric Acid, Serum: 7.1 mg/dL (ref 3.7–8.6)

## 2020-05-02 LAB — LACTATE DEHYDROGENASE: LDH: 154 U/L (ref 98–192)

## 2020-05-03 ENCOUNTER — Other Ambulatory Visit: Payer: Self-pay

## 2020-05-03 ENCOUNTER — Inpatient Hospital Stay: Payer: Medicare Other

## 2020-05-03 VITALS — BP 142/69 | HR 86 | Temp 97.8°F | Resp 16

## 2020-05-03 DIAGNOSIS — Z5112 Encounter for antineoplastic immunotherapy: Secondary | ICD-10-CM | POA: Diagnosis not present

## 2020-05-03 DIAGNOSIS — C9110 Chronic lymphocytic leukemia of B-cell type not having achieved remission: Secondary | ICD-10-CM

## 2020-05-03 MED ORDER — DIPHENHYDRAMINE HCL 50 MG/ML IJ SOLN
25.0000 mg | Freq: Once | INTRAMUSCULAR | Status: AC
  Administered 2020-05-03: 25 mg via INTRAVENOUS

## 2020-05-03 MED ORDER — ACETAMINOPHEN 325 MG PO TABS
ORAL_TABLET | ORAL | Status: AC
  Filled 2020-05-03: qty 2

## 2020-05-03 MED ORDER — SODIUM CHLORIDE 0.9 % IV SOLN
Freq: Once | INTRAVENOUS | Status: AC
  Filled 2020-05-03: qty 250

## 2020-05-03 MED ORDER — DIPHENHYDRAMINE HCL 50 MG/ML IJ SOLN
INTRAMUSCULAR | Status: AC
  Filled 2020-05-03: qty 1

## 2020-05-03 MED ORDER — SODIUM CHLORIDE 0.9 % IV SOLN
1000.0000 mg | Freq: Once | INTRAVENOUS | Status: AC
  Administered 2020-05-03: 1000 mg via INTRAVENOUS
  Filled 2020-05-03: qty 40

## 2020-05-03 MED ORDER — SODIUM CHLORIDE 0.9 % IV SOLN
10.0000 mg | Freq: Once | INTRAVENOUS | Status: AC
  Administered 2020-05-03: 10 mg via INTRAVENOUS
  Filled 2020-05-03: qty 10

## 2020-05-03 MED ORDER — ACETAMINOPHEN 325 MG PO TABS
650.0000 mg | ORAL_TABLET | Freq: Once | ORAL | Status: AC
  Administered 2020-05-03: 650 mg via ORAL

## 2020-05-03 NOTE — Patient Instructions (Signed)
[facility] Discharge Instructions for Patients Receiving Chemotherapy  Today you received the following chemotherapy agents: Obinutuzumab (Gazyva)  To help prevent nausea and vomiting after your treatment, we encourage you to take your nausea medication  as prescribed.    If you develop nausea and vomiting that is not controlled by your nausea medication, call the clinic.   BELOW ARE SYMPTOMS THAT SHOULD BE REPORTED IMMEDIATELY:  *FEVER GREATER THAN 100.5 F  *CHILLS WITH OR WITHOUT FEVER  NAUSEA AND VOMITING THAT IS NOT CONTROLLED WITH YOUR NAUSEA MEDICATION  *UNUSUAL SHORTNESS OF BREATH  *UNUSUAL BRUISING OR BLEEDING  TENDERNESS IN MOUTH AND THROAT WITH OR WITHOUT PRESENCE OF ULCERS  *URINARY PROBLEMS  *BOWEL PROBLEMS  UNUSUAL RASH Items with * indicate a potential emergency and should be followed up as soon as possible.  Feel free to call the clinic should you have any questions or concerns. The clinic phone number is [phone].  Please show the CHEMO ALERT CARD at check-in to the Emergency Department and triage nurse.   

## 2020-05-03 NOTE — Progress Notes (Signed)
Per Cassandra Heilingoetter,, PA - okay to proceed with treatment with elevated heart rate.

## 2020-05-11 ENCOUNTER — Other Ambulatory Visit: Payer: Self-pay | Admitting: Internal Medicine

## 2020-05-11 DIAGNOSIS — C9110 Chronic lymphocytic leukemia of B-cell type not having achieved remission: Secondary | ICD-10-CM

## 2020-05-30 ENCOUNTER — Other Ambulatory Visit: Payer: Self-pay | Admitting: Physician Assistant

## 2020-05-30 ENCOUNTER — Encounter: Payer: Self-pay | Admitting: Internal Medicine

## 2020-05-30 ENCOUNTER — Inpatient Hospital Stay: Payer: Medicare Other | Attending: Physician Assistant | Admitting: Internal Medicine

## 2020-05-30 ENCOUNTER — Inpatient Hospital Stay: Payer: Medicare Other

## 2020-05-30 ENCOUNTER — Other Ambulatory Visit: Payer: Medicare Other

## 2020-05-30 ENCOUNTER — Other Ambulatory Visit: Payer: Self-pay

## 2020-05-30 VITALS — BP 143/69 | HR 107 | Temp 98.2°F | Resp 17 | Ht 70.0 in | Wt 234.5 lb

## 2020-05-30 VITALS — BP 114/65 | HR 65 | Temp 97.8°F | Resp 16

## 2020-05-30 DIAGNOSIS — E875 Hyperkalemia: Secondary | ICD-10-CM

## 2020-05-30 DIAGNOSIS — Z794 Long term (current) use of insulin: Secondary | ICD-10-CM | POA: Diagnosis not present

## 2020-05-30 DIAGNOSIS — Z79899 Other long term (current) drug therapy: Secondary | ICD-10-CM | POA: Insufficient documentation

## 2020-05-30 DIAGNOSIS — Z7951 Long term (current) use of inhaled steroids: Secondary | ICD-10-CM | POA: Diagnosis not present

## 2020-05-30 DIAGNOSIS — J449 Chronic obstructive pulmonary disease, unspecified: Secondary | ICD-10-CM | POA: Diagnosis not present

## 2020-05-30 DIAGNOSIS — C9110 Chronic lymphocytic leukemia of B-cell type not having achieved remission: Secondary | ICD-10-CM

## 2020-05-30 DIAGNOSIS — Z7982 Long term (current) use of aspirin: Secondary | ICD-10-CM | POA: Diagnosis not present

## 2020-05-30 DIAGNOSIS — E119 Type 2 diabetes mellitus without complications: Secondary | ICD-10-CM | POA: Diagnosis not present

## 2020-05-30 DIAGNOSIS — Z5111 Encounter for antineoplastic chemotherapy: Secondary | ICD-10-CM

## 2020-05-30 DIAGNOSIS — Z5112 Encounter for antineoplastic immunotherapy: Secondary | ICD-10-CM | POA: Diagnosis not present

## 2020-05-30 DIAGNOSIS — I10 Essential (primary) hypertension: Secondary | ICD-10-CM | POA: Diagnosis not present

## 2020-05-30 LAB — CMP (CANCER CENTER ONLY)
ALT: 16 U/L (ref 0–44)
AST: 15 U/L (ref 15–41)
Albumin: 3.8 g/dL (ref 3.5–5.0)
Alkaline Phosphatase: 70 U/L (ref 38–126)
Anion gap: 6 (ref 5–15)
BUN: 20 mg/dL (ref 6–20)
CO2: 26 mmol/L (ref 22–32)
Calcium: 9.9 mg/dL (ref 8.9–10.3)
Chloride: 106 mmol/L (ref 98–111)
Creatinine: 1.29 mg/dL — ABNORMAL HIGH (ref 0.61–1.24)
GFR, Estimated: >60 mL/min (ref >60)
Glucose, Bld: 130 mg/dL — ABNORMAL HIGH (ref 70–99)
Potassium: 5.5 mmol/L — ABNORMAL HIGH (ref 3.5–5.1)
Sodium: 138 mmol/L (ref 135–145)
Total Bilirubin: 0.3 mg/dL (ref 0.3–1.2)
Total Protein: 6.7 g/dL (ref 6.5–8.1)

## 2020-05-30 LAB — BASIC METABOLIC PANEL - CANCER CENTER ONLY
Anion gap: 5 (ref 5–15)
BUN: 20 mg/dL (ref 6–20)
CO2: 26 mmol/L (ref 22–32)
Calcium: 10 mg/dL (ref 8.9–10.3)
Chloride: 105 mmol/L (ref 98–111)
Creatinine: 1.25 mg/dL — ABNORMAL HIGH (ref 0.61–1.24)
GFR, Estimated: >60 mL/min (ref >60)
Glucose, Bld: 90 mg/dL (ref 70–99)
Potassium: 5.7 mmol/L — ABNORMAL HIGH (ref 3.5–5.1)
Sodium: 136 mmol/L (ref 135–145)

## 2020-05-30 LAB — CBC WITH DIFFERENTIAL (CANCER CENTER ONLY)
Abs Immature Granulocytes: 0.03 10*3/uL (ref 0.00–0.07)
Basophils Absolute: 0 10*3/uL (ref 0.0–0.1)
Basophils Relative: 1 %
Eosinophils Absolute: 0.1 10*3/uL (ref 0.0–0.5)
Eosinophils Relative: 2 %
HCT: 43.2 % (ref 39.0–52.0)
Hemoglobin: 13.8 g/dL (ref 13.0–17.0)
Immature Granulocytes: 0 %
Lymphocytes Relative: 15 %
Lymphs Abs: 1.1 10*3/uL (ref 0.7–4.0)
MCH: 27.3 pg (ref 26.0–34.0)
MCHC: 31.9 g/dL (ref 30.0–36.0)
MCV: 85.5 fL (ref 80.0–100.0)
Monocytes Absolute: 1 10*3/uL (ref 0.1–1.0)
Monocytes Relative: 13 %
Neutro Abs: 5.3 10*3/uL (ref 1.7–7.7)
Neutrophils Relative %: 69 %
Platelet Count: 159 10*3/uL (ref 150–400)
RBC: 5.05 MIL/uL (ref 4.22–5.81)
RDW: 15.9 % — ABNORMAL HIGH (ref 11.5–15.5)
WBC Count: 7.6 10*3/uL (ref 4.0–10.5)
nRBC: 0 % (ref 0.0–0.2)

## 2020-05-30 LAB — URIC ACID: Uric Acid, Serum: 7.2 mg/dL (ref 3.7–8.6)

## 2020-05-30 LAB — LACTATE DEHYDROGENASE: LDH: 158 U/L (ref 98–192)

## 2020-05-30 MED ORDER — SODIUM CHLORIDE 0.9 % IV SOLN
10.0000 mg | Freq: Once | INTRAVENOUS | Status: AC
  Administered 2020-05-30: 10 mg via INTRAVENOUS
  Filled 2020-05-30: qty 10

## 2020-05-30 MED ORDER — HEPARIN SOD (PORK) LOCK FLUSH 100 UNIT/ML IV SOLN
500.0000 [IU] | Freq: Once | INTRAVENOUS | Status: DC | PRN
  Filled 2020-05-30: qty 5

## 2020-05-30 MED ORDER — DIPHENHYDRAMINE HCL 50 MG/ML IJ SOLN
INTRAMUSCULAR | Status: AC
  Filled 2020-05-30: qty 1

## 2020-05-30 MED ORDER — ACETAMINOPHEN 325 MG PO TABS
650.0000 mg | ORAL_TABLET | Freq: Once | ORAL | Status: AC
  Administered 2020-05-30: 650 mg via ORAL

## 2020-05-30 MED ORDER — DIPHENHYDRAMINE HCL 50 MG/ML IJ SOLN
25.0000 mg | Freq: Once | INTRAMUSCULAR | Status: AC
  Administered 2020-05-30: 25 mg via INTRAVENOUS

## 2020-05-30 MED ORDER — ACETAMINOPHEN 325 MG PO TABS
ORAL_TABLET | ORAL | Status: AC
  Filled 2020-05-30: qty 2

## 2020-05-30 MED ORDER — SODIUM CHLORIDE 0.9% FLUSH
10.0000 mL | INTRAVENOUS | Status: DC | PRN
  Filled 2020-05-30: qty 10

## 2020-05-30 MED ORDER — SODIUM CHLORIDE 0.9 % IV SOLN
Freq: Once | INTRAVENOUS | Status: AC
  Filled 2020-05-30: qty 250

## 2020-05-30 MED ORDER — SODIUM POLYSTYRENE SULFONATE 15 GM/60ML PO SUSP: 15.0000 g | Freq: Once | ORAL | 0 refills | Status: AC

## 2020-05-30 MED ORDER — SODIUM CHLORIDE 0.9 % IV SOLN
1000.0000 mg | Freq: Once | INTRAVENOUS | Status: AC
  Administered 2020-05-30: 1000 mg via INTRAVENOUS
  Filled 2020-05-30: qty 40

## 2020-05-30 NOTE — Progress Notes (Signed)
[facility] [phone]   [phone]  OFFICE PROGRESS NOTE  [name], MD [place] [address]  DIAGNOSIS: CLL/small lymphocytic lymphoma diagnosed in 2010 and has been in observation for several years with no treatment. 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 The cytogenetics were negative for P 17.   PRIOR THERAPY: None  CURRENT THERAPY: Obinutuzumab 1,000 mg IV. First dose on 08/10/2019. He will also begin acalabrutinib 100 mg BID starting on 08/19/2019.  Status post 10 cycles.  INTERVAL HISTORY: Larry Lam 49 y.o. male returns to the clinic today for follow-up visit.  The patient is feeling fine today with no concerning complaints.  He has been tolerating his treatment with obinutuzumab and acalabrutinib fairly well.  He denied having any current nausea, vomiting, diarrhea or constipation.  He denied having any chest pain, shortness of breath, cough or hemoptysis.  He denied having any fever or chills.  He is here today for evaluation before starting cycle #11 of his treatment.  MEDICAL HISTORY: Past Medical History:  Diagnosis Date  . Asthma   . cll dx'd 04/2019  . COPD (chronic obstructive pulmonary disease) ([place])   . Diabetes mellitus without complication ([place])   . Hypertension   . Seizures ([place])    3-4 years ago. Single episode.  . Tobacco use     ALLERGIES:  is allergic to amlodipine besylate-valsartan and hydrochlorothiazide.  MEDICATIONS:  Current Outpatient Medications  Medication Sig Dispense Refill  . ASPIRIN 81 PO Take 81 mg by mouth daily.     Marland Kitchen atorvastatin (LIPITOR) 40 MG tablet Take 40 mg by mouth at bedtime.    Marland Kitchen CALQUENCE 100 MG capsule TAKE 1 CAPSULE (100 MG TOTAL) BY MOUTH 2 (TWO) TIMES DAILY. 60 capsule 2  . escitalopram (LEXAPRO) 20 MG tablet Take 20 mg by mouth daily.    . fluticasone furoate-vilanterol (BREO ELLIPTA) 200-25  MCG/INH AEPB Inhale 1 puff into the lungs daily. 28 each 1  . HUMALOG KWIKPEN 100 UNIT/ML KwikPen Inject 15 Units into the skin as directed.    . Insulin Detemir (LEVEMIR) 100 UNIT/ML Pen Inject 30 Units into the skin daily. (Patient taking differently: Inject 46 Units into the skin daily. ) 18 mL 0  . Insulin Pen Needle (PEN NEEDLES 3/16") 31G X 5 MM MISC Use as directed with insulin pen 100 each 0  . ipratropium-albuterol (DUONEB) 0.5-2.5 (3) MG/3ML SOLN Take 3 mLs by nebulization every 6 (six) hours as needed. J44.9 and J44.1 360 mL 0  . metFORMIN (GLUCOPHAGE-XR) 500 MG 24 hr tablet Take 1,000 mg by mouth 2 (two) times daily.    . pantoprazole (PROTONIX) 40 MG tablet Take 1 tablet (40 mg total) by mouth daily. 30 tablet 11  . vitamin B-12 (CYANOCOBALAMIN) 1000 MCG tablet Take 1 tablet (1,000 mcg total) by mouth daily. 90 tablet 0   No current facility-administered medications for this visit.    SURGICAL HISTORY: No past surgical history on file.  REVIEW OF SYSTEMS:  A comprehensive review of systems was negative except for: Constitutional: positive for fatigue   PHYSICAL EXAMINATION: General appearance: alert, cooperative, fatigued and no distress Head: Normocephalic, without obvious abnormality, atraumatic Neck: no adenopathy, no JVD, supple, symmetrical, trachea midline and thyroid not enlarged, symmetric, no tenderness/mass/nodules Lymph nodes: Cervical, supraclavicular, and axillary nodes normal. Resp: clear to auscultation bilaterally Back: symmetric, no curvature. ROM normal. No CVA tenderness.  Cardio: regular rate and rhythm, S1, S2 normal, no murmur, click, rub or gallop GI: soft, non-tender; bowel sounds normal; no masses,  no organomegaly Extremities: extremities normal, atraumatic, no cyanosis or edema  ECOG PERFORMANCE STATUS: 1 - Symptomatic but completely ambulatory  Blood pressure (!) 143/69, pulse (!) 107, temperature 98.2 F (36.8 C), temperature source Tympanic,  resp. rate 17, height 5\' 10"  (1.778 m), weight 234 lb 8 oz (106.4 kg), SpO2 97 %.  LABORATORY DATA: Lab Results  Component Value Date   WBC 7.6 05/30/2020   HGB 13.8 05/30/2020   HCT 43.2 05/30/2020   MCV 85.5 05/30/2020   PLT 159 05/30/2020      Chemistry      Component Value Date/Time   NA 137 05/02/2020 1352   NA 137 07/06/2012 0506   K 5.0 05/02/2020 1352   K 4.0 07/06/2012 0506   CL 103 05/02/2020 1352   CL 104 07/06/2012 0506   CO2 26 05/02/2020 1352   CO2 25 07/06/2012 0506   BUN 14 05/02/2020 1352   BUN 12 07/06/2012 0506   CREATININE 1.11 05/02/2020 1352   CREATININE 0.62 07/06/2012 0506      Component Value Date/Time   CALCIUM 9.7 05/02/2020 1352   CALCIUM 8.6 07/06/2012 0506   ALKPHOS 100 05/02/2020 1352   ALKPHOS 96 07/06/2012 0506   AST 9 (L) 05/02/2020 1352   ALT 14 05/02/2020 1352   ALT 30 07/06/2012 0506   BILITOT 0.3 05/02/2020 1352       RADIOGRAPHIC STUDIES: No results found.  ASSESSMENT AND PLAN: This is a very pleasant 49 years old white male with CLL/small lymphocytic lymphoma diagnosed in 2010 and has been in observation for several years but the patient had significant disease progression and August 2020. He is currently undergoing treatment with obinutuzumab every 4 weeks in addition to acalabrutinib 100 mg p.o. twice daily.  Status post 10 cycles. The patient continues to to tolerate his treatment well with no concerning adverse effects. I recommended for him to proceed with cycle #11 today as planned. For the hyperkalemia we will repeat his potassium level today and I advised the patient to cut on any potassium supplements. He will come back for follow-up visit in 4 weeks for evaluation before the next cycle of his treatment. The patient was advised to call immediately if he has any concerning symptoms in the interval. The patient voices understanding of current disease status and treatment options and is in agreement with the current care  plan.  All questions were answered. The patient knows to call the clinic with any problems, questions or concerns. We can certainly see the patient much sooner if necessary.   Disclaimer: This note was dictated with voice recognition software. Similar sounding words can inadvertently be transcribed and may not be corrected upon review.

## 2020-05-30 NOTE — Patient Instructions (Signed)
[facility] Discharge Instructions for Patients Receiving Chemotherapy  Today you received the following chemotherapy agents: Obinutuzumab (Gazyva)  To help prevent nausea and vomiting after your treatment, we encourage you to take your nausea medication  as prescribed.    If you develop nausea and vomiting that is not controlled by your nausea medication, call the clinic.   BELOW ARE SYMPTOMS THAT SHOULD BE REPORTED IMMEDIATELY:  *FEVER GREATER THAN 100.5 F  *CHILLS WITH OR WITHOUT FEVER  NAUSEA AND VOMITING THAT IS NOT CONTROLLED WITH YOUR NAUSEA MEDICATION  *UNUSUAL SHORTNESS OF BREATH  *UNUSUAL BRUISING OR BLEEDING  TENDERNESS IN MOUTH AND THROAT WITH OR WITHOUT PRESENCE OF ULCERS  *URINARY PROBLEMS  *BOWEL PROBLEMS  UNUSUAL RASH Items with * indicate a potential emergency and should be followed up as soon as possible.  Feel free to call the clinic should you have any questions or concerns. The clinic phone number is [phone].  Please show the CHEMO ALERT CARD at check-in to the Emergency Department and triage nurse.   

## 2020-06-23 NOTE — Progress Notes (Signed)
[facility] OFFICE PROGRESS NOTE  [place] [address]  DIAGNOSIS: CLL/small lymphocytic lymphoma diagnosed in 2010 and has been in observation for several years with no treatment. 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 The cytogenetics were negative for P 17.  PRIOR THERAPY: None  CURRENT THERAPY: Obinutuzumab 1,000 mg IV. First dose on 08/10/2019. He will also begin acalabrutinib 100 mg BID starting on 08/19/2019. Status post11cycles.  INTERVAL HISTORY: Larry Lam 49 y.o. male returns to the clinic today for a follow up visit. The patient is feeling well today without any concerning complaints. He denies fever, chills,or night sweats.Denies lymphadenopathy. Denies chest pain or hemoptysis. He denies cough. He reports his baseline shortness of breath, but nothing unusual. He uses albuterol if needed.He wears oxygen via nasal cannula if needed at night. He denies peripheral neuropathy. He denies nausea, vomiting, diarrhea, or constipation. Denies abdominal bloating, abdominal pain, or early satiety. His potassium is usually high on routine labs. He denies taking potassium supplements. He is here for evaluation before starting cycle #12.    MEDICAL HISTORY: Past Medical History:  Diagnosis Date  . Asthma   . cll dx'd 04/2019  . COPD (chronic obstructive pulmonary disease) ([place])   . Diabetes mellitus without complication ([place])   . Hypertension   . Seizures ([place])    3-4 years ago. Single episode.  . Tobacco use     ALLERGIES:  is allergic to amlodipine besylate-valsartan and hydrochlorothiazide.  MEDICATIONS:  Current Outpatient Medications  Medication Sig Dispense Refill  . albuterol (PROVENTIL) (2.5 MG/3ML) 0.083% nebulizer solution Take 1 mL by nebulization as needed. Every 4-6 hours as needed    . ASPIRIN 81 PO Take 81 mg by mouth daily.     Marland Kitchen atorvastatin  (LIPITOR) 40 MG tablet Take 40 mg by mouth at bedtime.    Marland Kitchen CALQUENCE 100 MG capsule TAKE 1 CAPSULE (100 MG TOTAL) BY MOUTH 2 (TWO) TIMES DAILY. 60 capsule 2  . EASY TOUCH PEN NEEDLES 31G X 6 MM MISC Inject 1 application into the skin daily.    Marland Kitchen escitalopram (LEXAPRO) 20 MG tablet Take 20 mg by mouth daily.    . fluticasone furoate-vilanterol (BREO ELLIPTA) 200-25 MCG/INH AEPB Inhale 1 puff into the lungs daily. 28 each 1  . HUMALOG KWIKPEN 100 UNIT/ML KwikPen Inject 15 Units into the skin as directed.    . Insulin Pen Needle (PEN NEEDLES 3/16") 31G X 5 MM MISC Use as directed with insulin pen 100 each 0  . ipratropium-albuterol (DUONEB) 0.5-2.5 (3) MG/3ML SOLN Take 3 mLs by nebulization every 6 (six) hours as needed. J44.9 and J44.1 360 mL 0  . lisinopril (ZESTRIL) 40 MG tablet Take 40 mg by mouth daily.    . metFORMIN (GLUCOPHAGE-XR) 500 MG 24 hr tablet Take 1,000 mg by mouth 2 (two) times daily.    Marland Kitchen NOVOLOG FLEXPEN 100 UNIT/ML FlexPen Inject into the skin as directed.     . pantoprazole (PROTONIX) 40 MG tablet Take 1 tablet (40 mg total) by mouth daily. 30 tablet 11  . VENTOLIN HFA 108 (90 Base) MCG/ACT inhaler Inhale 1-2 puffs into the lungs as directed.    . vitamin B-12 (CYANOCOBALAMIN) 1000 MCG tablet Take 1 tablet (1,000 mcg total) by mouth daily. 90 tablet 0  . Insulin Detemir (LEVEMIR) 100 UNIT/ML Pen Inject 30 Units into the skin daily. (Patient taking differently: Inject 46 Units  into the skin daily. ) 18 mL 0   No current facility-administered medications for this visit.    SURGICAL HISTORY: No past surgical history on file.  REVIEW OF SYSTEMS:   Review of Systems  Constitutional: Negative for appetite change, chills, fatigue, fever and unexpected weight change.  HENT: Negative for mouth sores, nosebleeds, sore throat and trouble swallowing.   Eyes: Negative for eye problems and icterus.  Respiratory: Positive for baseline shortness of breath. Negative for cough, hemoptysis,  and wheezing.   Cardiovascular: Negative for chest pain and leg swelling.  Gastrointestinal: Negative for abdominal pain, constipation, diarrhea, nausea and vomiting.  Genitourinary: Negative for bladder incontinence, difficulty urinating, dysuria, frequency and hematuria.   Musculoskeletal: Negative for back pain, gait problem, neck pain and neck stiffness.  Skin: Negative for itching and rash.  Neurological: Negative for dizziness, extremity weakness, gait problem, headaches, light-headedness and seizures.  Hematological: Negative for adenopathy. Does not bruise/bleed easily.  Psychiatric/Behavioral: Negative for confusion, depression and sleep disturbance. The patient is not nervous/anxious.     PHYSICAL EXAMINATION:  Blood pressure (!) 154/67, pulse (!) 116, temperature 99 F (37.2 C), temperature source Tympanic, resp. rate 18, height 5\' 10"  (1.778 m), weight 231 lb 14.4 oz (105.2 kg), SpO2 96 %.  ECOG PERFORMANCE STATUS: 1 - Symptomatic but completely ambulatory  Physical Exam  Constitutional: Oriented to person, place, and time and well-developed, well-nourished, and in no distress.  HENT:  Head: Normocephalic and atraumatic.  Mouth/Throat: Oropharynx is clear and moist. No oropharyngeal exudate.  Eyes: Conjunctivae are normal. Right eye exhibits no discharge. Left eye exhibits no discharge. No scleral icterus.  Neck: Normal range of motion. Neck supple.  Cardiovascular: tachycardic, regular rhythm, normal heart sounds and intact distal pulses.   Pulmonary/Chest: Effort normal and breath sounds normal. No respiratory distress. No wheezes. No rales.  Abdominal: Soft. Bowel sounds are normal. Exhibits no distension and no mass. There is no tenderness.  Musculoskeletal: Normal range of motion. Exhibits no edema.  Lymphadenopathy:    No cervical adenopathy.  Neurological: Alert and oriented to person, place, and time. Exhibits normal muscle tone. Gait normal. Coordination normal.   Skin: Skin is warm and dry. No rash noted. Not diaphoretic. No erythema. No pallor.  Psychiatric: Mood, memory and judgment normal.  Vitals reviewed.  LABORATORY DATA: Lab Results  Component Value Date   WBC 10.6 (H) 06/27/2020   HGB 13.7 06/27/2020   HCT 43.0 06/27/2020   MCV 85.0 06/27/2020   PLT 148 (L) 06/27/2020      Chemistry      Component Value Date/Time   NA 133 (L) 06/27/2020 0905   NA 137 07/06/2012 0506   K 5.2 (H) 06/27/2020 0905   K 4.0 07/06/2012 0506   CL 102 06/27/2020 0905   CL 104 07/06/2012 0506   CO2 23 06/27/2020 0905   CO2 25 07/06/2012 0506   BUN 21 (H) 06/27/2020 0905   BUN 12 07/06/2012 0506   CREATININE 1.35 (H) 06/27/2020 0905   CREATININE 0.62 07/06/2012 0506      Component Value Date/Time   CALCIUM 9.2 06/27/2020 0905   CALCIUM 8.6 07/06/2012 0506   ALKPHOS 79 06/27/2020 0905   ALKPHOS 96 07/06/2012 0506   AST 17 06/27/2020 0905   ALT 16 06/27/2020 0905   ALT 30 07/06/2012 0506   BILITOT 0.3 06/27/2020 0905       RADIOGRAPHIC STUDIES:  No results found.   ASSESSMENT/PLAN:  This is a very pleasant1 year old Caucasian male diagnosed  withCLL/small lymphocytic lymphoma diagnosed in 2010 and has been in observation for several years with no treatment. 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 The cytogenetics were negative for P 17.  The patient is currently undergoing treatment withObinutuzumab 1,000 mg IV. He is status post cycle #11.He tolerated well without any adverse side effects.He has been onacalabrutinib 100 mg BID starting on 08/19/2019.   Labs were reviewed. Recommend that heproceedwith cycle #12 today as scheduled.   We will see him back for a follow up visit in 4 weeks for evaluation before starting cycle #13.  I will arrange for a restaging CT scan of the chest, abdomen, and pelvis prior to starting his next cycle of treatment.   The patient's labs typically  demonstrate hyperkalemia. His potassium is slightly elevated at 5.2 today. I provided the patient with handouts on foods low in potassium and encouraged a low potassium diet. I also provided him with a high potassium diet handout so he would know what foods to avoid.   The patient was advised to call immediately if he has any concerning symptoms in the interval. The patient voices understanding of current disease status and treatment options and is in agreement with the current care plan. All questions were answered. The patient knows to call the clinic with any problems, questions or concerns. We can certainly see the patient much sooner if necessary     Orders Placed This Encounter  Procedures  . CT Chest W Contrast    Standing Status:   Future    Standing Expiration Date:   06/27/2021    Order Specific Question:   If indicated for the ordered procedure, I authorize the administration of contrast media per Radiology protocol    Answer:   Yes    Order Specific Question:   Preferred imaging location?    Answer:   [facility]  . CT Abdomen Pelvis W Contrast    Standing Status:   Future    Standing Expiration Date:   06/27/2021    Order Specific Question:   If indicated for the ordered procedure, I authorize the administration of contrast media per Radiology protocol    Answer:   Yes    Order Specific Question:   Preferred imaging location?    Answer:   [facility]    Order Specific Question:   Is Oral Contrast requested for this exam?    Answer:   Yes, Per Radiology protocol     Elzena Muston L Pravin Perezperez, PA-C 06/27/20

## 2020-06-27 ENCOUNTER — Inpatient Hospital Stay: Payer: Medicare Other

## 2020-06-27 ENCOUNTER — Other Ambulatory Visit: Payer: Medicare Other

## 2020-06-27 ENCOUNTER — Encounter: Payer: Self-pay | Admitting: Physician Assistant

## 2020-06-27 ENCOUNTER — Inpatient Hospital Stay: Payer: Medicare Other | Attending: Physician Assistant | Admitting: Physician Assistant

## 2020-06-27 ENCOUNTER — Other Ambulatory Visit: Payer: Self-pay

## 2020-06-27 VITALS — BP 154/67 | HR 116 | Temp 99.0°F | Resp 18 | Ht 70.0 in | Wt 231.9 lb

## 2020-06-27 VITALS — BP 131/52 | HR 90 | Temp 98.0°F | Resp 18

## 2020-06-27 DIAGNOSIS — Z5111 Encounter for antineoplastic chemotherapy: Secondary | ICD-10-CM | POA: Diagnosis not present

## 2020-06-27 DIAGNOSIS — Z794 Long term (current) use of insulin: Secondary | ICD-10-CM | POA: Insufficient documentation

## 2020-06-27 DIAGNOSIS — Z79899 Other long term (current) drug therapy: Secondary | ICD-10-CM | POA: Insufficient documentation

## 2020-06-27 DIAGNOSIS — I10 Essential (primary) hypertension: Secondary | ICD-10-CM | POA: Diagnosis not present

## 2020-06-27 DIAGNOSIS — Z7951 Long term (current) use of inhaled steroids: Secondary | ICD-10-CM | POA: Insufficient documentation

## 2020-06-27 DIAGNOSIS — C9110 Chronic lymphocytic leukemia of B-cell type not having achieved remission: Secondary | ICD-10-CM | POA: Diagnosis present

## 2020-06-27 DIAGNOSIS — R590 Localized enlarged lymph nodes: Secondary | ICD-10-CM

## 2020-06-27 DIAGNOSIS — E119 Type 2 diabetes mellitus without complications: Secondary | ICD-10-CM | POA: Diagnosis not present

## 2020-06-27 DIAGNOSIS — Z7982 Long term (current) use of aspirin: Secondary | ICD-10-CM | POA: Insufficient documentation

## 2020-06-27 DIAGNOSIS — Z5112 Encounter for antineoplastic immunotherapy: Secondary | ICD-10-CM | POA: Diagnosis present

## 2020-06-27 DIAGNOSIS — E875 Hyperkalemia: Secondary | ICD-10-CM | POA: Diagnosis not present

## 2020-06-27 DIAGNOSIS — J449 Chronic obstructive pulmonary disease, unspecified: Secondary | ICD-10-CM | POA: Insufficient documentation

## 2020-06-27 LAB — CMP (CANCER CENTER ONLY)
ALT: 16 U/L (ref 0–44)
AST: 17 U/L (ref 15–41)
Albumin: 3.8 g/dL (ref 3.5–5.0)
Alkaline Phosphatase: 79 U/L (ref 38–126)
Anion gap: 8 (ref 5–15)
BUN: 21 mg/dL — ABNORMAL HIGH (ref 6–20)
CO2: 23 mmol/L (ref 22–32)
Calcium: 9.2 mg/dL (ref 8.9–10.3)
Chloride: 102 mmol/L (ref 98–111)
Creatinine: 1.35 mg/dL — ABNORMAL HIGH (ref 0.61–1.24)
GFR, Estimated: >60 mL/min (ref >60)
Glucose, Bld: 190 mg/dL — ABNORMAL HIGH (ref 70–99)
Potassium: 5.2 mmol/L — ABNORMAL HIGH (ref 3.5–5.1)
Sodium: 133 mmol/L — ABNORMAL LOW (ref 135–145)
Total Bilirubin: 0.3 mg/dL (ref 0.3–1.2)
Total Protein: 6.5 g/dL (ref 6.5–8.1)

## 2020-06-27 LAB — CBC WITH DIFFERENTIAL (CANCER CENTER ONLY)
Abs Immature Granulocytes: 0.08 10*3/uL — ABNORMAL HIGH (ref 0.00–0.07)
Basophils Absolute: 0.1 10*3/uL (ref 0.0–0.1)
Basophils Relative: 1 %
Eosinophils Absolute: 0.1 10*3/uL (ref 0.0–0.5)
Eosinophils Relative: 1 %
HCT: 43 % (ref 39.0–52.0)
Hemoglobin: 13.7 g/dL (ref 13.0–17.0)
Immature Granulocytes: 1 %
Lymphocytes Relative: 7 %
Lymphs Abs: 0.8 10*3/uL (ref 0.7–4.0)
MCH: 27.1 pg (ref 26.0–34.0)
MCHC: 31.9 g/dL (ref 30.0–36.0)
MCV: 85 fL (ref 80.0–100.0)
Monocytes Absolute: 1.3 10*3/uL — ABNORMAL HIGH (ref 0.1–1.0)
Monocytes Relative: 12 %
Neutro Abs: 8.2 10*3/uL — ABNORMAL HIGH (ref 1.7–7.7)
Neutrophils Relative %: 78 %
Platelet Count: 148 10*3/uL — ABNORMAL LOW (ref 150–400)
RBC: 5.06 MIL/uL (ref 4.22–5.81)
RDW: 15.3 % (ref 11.5–15.5)
WBC Count: 10.6 10*3/uL — ABNORMAL HIGH (ref 4.0–10.5)
nRBC: 0 % (ref 0.0–0.2)

## 2020-06-27 LAB — LACTATE DEHYDROGENASE: LDH: 145 U/L (ref 98–192)

## 2020-06-27 LAB — URIC ACID: Uric Acid, Serum: 7.7 mg/dL (ref 3.7–8.6)

## 2020-06-27 MED ORDER — SODIUM CHLORIDE 0.9 % IV SOLN
1000.0000 mg | Freq: Once | INTRAVENOUS | Status: AC
  Administered 2020-06-27: 1000 mg via INTRAVENOUS
  Filled 2020-06-27: qty 40

## 2020-06-27 MED ORDER — SODIUM CHLORIDE 0.9 % IV SOLN
10.0000 mg | Freq: Once | INTRAVENOUS | Status: AC
  Administered 2020-06-27: 10 mg via INTRAVENOUS
  Filled 2020-06-27: qty 10

## 2020-06-27 MED ORDER — DIPHENHYDRAMINE HCL 50 MG/ML IJ SOLN
INTRAMUSCULAR | Status: AC
  Filled 2020-06-27: qty 1

## 2020-06-27 MED ORDER — DIPHENHYDRAMINE HCL 50 MG/ML IJ SOLN
25.0000 mg | Freq: Once | INTRAMUSCULAR | Status: AC
  Administered 2020-06-27: 25 mg via INTRAVENOUS

## 2020-06-27 MED ORDER — SODIUM CHLORIDE 0.9 % IV SOLN
Freq: Once | INTRAVENOUS | Status: AC
  Filled 2020-06-27: qty 250

## 2020-06-27 MED ORDER — ACETAMINOPHEN 325 MG PO TABS
650.0000 mg | ORAL_TABLET | Freq: Once | ORAL | Status: AC
  Administered 2020-06-27: 650 mg via ORAL

## 2020-06-27 MED ORDER — ACETAMINOPHEN 325 MG PO TABS
ORAL_TABLET | ORAL | Status: AC
  Filled 2020-06-27: qty 2

## 2020-06-27 NOTE — Progress Notes (Signed)
Per Cassie, PA: okay to treat with elevated HR of 107.

## 2020-06-27 NOTE — Patient Instructions (Signed)
[facility] Discharge Instructions for Patients Receiving Chemotherapy  Today you received the following chemotherapy agents Obinutuzumab (GAZYVA).  To help prevent nausea and vomiting after your treatment, we encourage you to take your nausea medication as prescribed.   If you develop nausea and vomiting that is not controlled by your nausea medication, call the clinic.   BELOW ARE SYMPTOMS THAT SHOULD BE REPORTED IMMEDIATELY:  *FEVER GREATER THAN 100.5 F  *CHILLS WITH OR WITHOUT FEVER  NAUSEA AND VOMITING THAT IS NOT CONTROLLED WITH YOUR NAUSEA MEDICATION  *UNUSUAL SHORTNESS OF BREATH  *UNUSUAL BRUISING OR BLEEDING  TENDERNESS IN MOUTH AND THROAT WITH OR WITHOUT PRESENCE OF ULCERS  *URINARY PROBLEMS  *BOWEL PROBLEMS  UNUSUAL RASH Items with * indicate a potential emergency and should be followed up as soon as possible.  Feel free to call the clinic should you have any questions or concerns. The clinic phone number is [phone].  Please show the [place] at check-in to the Emergency Department and triage nurse.

## 2020-06-29 ENCOUNTER — Telehealth: Payer: Self-pay | Admitting: Physician Assistant

## 2020-06-29 NOTE — Telephone Encounter (Signed)
Scheduled per los. Called and left msg. Mailed printout  °

## 2020-07-19 ENCOUNTER — Ambulatory Visit (HOSPITAL_COMMUNITY): Payer: Medicare Other

## 2020-07-25 ENCOUNTER — Telehealth: Payer: Self-pay | Admitting: Medical Oncology

## 2020-07-25 ENCOUNTER — Inpatient Hospital Stay: Payer: Medicare Other | Admitting: Internal Medicine

## 2020-07-25 ENCOUNTER — Inpatient Hospital Stay: Payer: Medicare Other

## 2020-07-25 NOTE — Telephone Encounter (Signed)
At [facility] transitioning from ICU to step down at [facility] -19 and kidney failure.  He has intermittent dialysis for "Kidney failure". He is not taking his Calquence -last dose 07/16/20. Wife said he will call back for appt after he is discharged.

## 2020-08-22 ENCOUNTER — Inpatient Hospital Stay: Payer: Medicare Other

## 2020-08-22 ENCOUNTER — Inpatient Hospital Stay: Payer: Medicare Other | Attending: Physician Assistant | Admitting: Internal Medicine

## 2020-08-22 ENCOUNTER — Other Ambulatory Visit: Payer: Self-pay

## 2020-08-22 VITALS — BP 119/57 | HR 86 | Temp 98.4°F | Resp 18 | Ht 70.0 in | Wt 201.0 lb

## 2020-08-22 DIAGNOSIS — Z7982 Long term (current) use of aspirin: Secondary | ICD-10-CM | POA: Insufficient documentation

## 2020-08-22 DIAGNOSIS — J449 Chronic obstructive pulmonary disease, unspecified: Secondary | ICD-10-CM | POA: Diagnosis not present

## 2020-08-22 DIAGNOSIS — Z79899 Other long term (current) drug therapy: Secondary | ICD-10-CM | POA: Insufficient documentation

## 2020-08-22 DIAGNOSIS — Z5111 Encounter for antineoplastic chemotherapy: Secondary | ICD-10-CM

## 2020-08-22 DIAGNOSIS — Z7951 Long term (current) use of inhaled steroids: Secondary | ICD-10-CM | POA: Diagnosis not present

## 2020-08-22 DIAGNOSIS — Z5112 Encounter for antineoplastic immunotherapy: Secondary | ICD-10-CM | POA: Diagnosis present

## 2020-08-22 DIAGNOSIS — E119 Type 2 diabetes mellitus without complications: Secondary | ICD-10-CM | POA: Diagnosis not present

## 2020-08-22 DIAGNOSIS — I10 Essential (primary) hypertension: Secondary | ICD-10-CM | POA: Insufficient documentation

## 2020-08-22 DIAGNOSIS — C9110 Chronic lymphocytic leukemia of B-cell type not having achieved remission: Secondary | ICD-10-CM | POA: Diagnosis present

## 2020-08-22 DIAGNOSIS — Z794 Long term (current) use of insulin: Secondary | ICD-10-CM | POA: Diagnosis not present

## 2020-08-22 DIAGNOSIS — Z8616 Personal history of COVID-19: Secondary | ICD-10-CM | POA: Insufficient documentation

## 2020-08-22 LAB — CBC WITH DIFFERENTIAL (CANCER CENTER ONLY)
Abs Immature Granulocytes: 0.37 10*3/uL — ABNORMAL HIGH (ref 0.00–0.07)
Basophils Absolute: 0.1 10*3/uL (ref 0.0–0.1)
Basophils Relative: 1 %
Eosinophils Absolute: 0.2 10*3/uL (ref 0.0–0.5)
Eosinophils Relative: 1 %
HCT: 36.2 % — ABNORMAL LOW (ref 39.0–52.0)
Hemoglobin: 11.6 g/dL — ABNORMAL LOW (ref 13.0–17.0)
Immature Granulocytes: 2 %
Lymphocytes Relative: 15 %
Lymphs Abs: 3 10*3/uL (ref 0.7–4.0)
MCH: 28.5 pg (ref 26.0–34.0)
MCHC: 32 g/dL (ref 30.0–36.0)
MCV: 88.9 fL (ref 80.0–100.0)
Monocytes Absolute: 1.4 10*3/uL — ABNORMAL HIGH (ref 0.1–1.0)
Monocytes Relative: 7 %
Neutro Abs: 15.7 10*3/uL — ABNORMAL HIGH (ref 1.7–7.7)
Neutrophils Relative %: 74 %
Platelet Count: 383 10*3/uL (ref 150–400)
RBC: 4.07 MIL/uL — ABNORMAL LOW (ref 4.22–5.81)
RDW: 16.1 % — ABNORMAL HIGH (ref 11.5–15.5)
WBC Count: 20.7 10*3/uL — ABNORMAL HIGH (ref 4.0–10.5)
nRBC: 0 % (ref 0.0–0.2)

## 2020-08-22 LAB — CMP (CANCER CENTER ONLY)
ALT: 14 U/L (ref 0–44)
AST: 11 U/L — ABNORMAL LOW (ref 15–41)
Albumin: 3.4 g/dL — ABNORMAL LOW (ref 3.5–5.0)
Alkaline Phosphatase: 55 U/L (ref 38–126)
Anion gap: 9 (ref 5–15)
BUN: 16 mg/dL (ref 6–20)
CO2: 26 mmol/L (ref 22–32)
Calcium: 10.2 mg/dL (ref 8.9–10.3)
Chloride: 102 mmol/L (ref 98–111)
Creatinine: 0.97 mg/dL (ref 0.61–1.24)
GFR, Estimated: >60 mL/min (ref >60)
Glucose, Bld: 188 mg/dL — ABNORMAL HIGH (ref 70–99)
Potassium: 5.2 mmol/L — ABNORMAL HIGH (ref 3.5–5.1)
Sodium: 137 mmol/L (ref 135–145)
Total Bilirubin: 0.3 mg/dL (ref 0.3–1.2)
Total Protein: 7 g/dL (ref 6.5–8.1)

## 2020-08-22 LAB — LACTATE DEHYDROGENASE: LDH: 115 U/L (ref 98–192)

## 2020-08-22 NOTE — Progress Notes (Signed)
[place] [phone]   [phone]  OFFICE PROGRESS NOTE  [name], MD [place] [id number]  DIAGNOSIS: CLL/small lymphocytic lymphoma diagnosed in 2010 and has been in observation for several years with no treatment. 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 The cytogenetics were negative for P 17.   PRIOR THERAPY: None  CURRENT THERAPY: Obinutuzumab 1,000 mg IV. First dose on 08/10/2019. He will also begin acalabrutinib 100 mg BID starting on 08/19/2019.  Status post 12 cycles.  INTERVAL HISTORY: [name] Dougal 49 y.o. male returns to the clinic today for follow-up visit accompanied by his son.  The patient has a rough time over the last few months.  He was diagnosed with COVID-19 and admitted to the intensive care unit for a long time.  He recovered from his infection.  He was also seen at the emergency department few weeks ago for hypoglycemia and hypomagnesemia.  He is feeling a little bit better today.  He continues to have shortness of breath with exertion.  He denied having any chest pain, cough or hemoptysis.  He denied having any fever or chills.  He has no nausea, vomiting, diarrhea or constipation.  He continues his current treatment with acalabrutinib and tolerating it fairly well.  The patient is here today for evaluation and repeat blood work.  MEDICAL HISTORY: Past Medical History:  Diagnosis Date  . Asthma   . cll dx'd 04/2019  . COPD (chronic obstructive pulmonary disease) ([place])   . Diabetes mellitus without complication ([place])   . Hypertension   . Seizures ([place])    3-4 years ago. Single episode.  . Tobacco use     ALLERGIES:  is allergic to amlodipine besylate-valsartan and hydrochlorothiazide.  MEDICATIONS:  Current Outpatient Medications  Medication Sig Dispense Refill  . albuterol (PROVENTIL) (2.5 MG/3ML) 0.083% nebulizer solution Take 1  mL by nebulization as needed. Every 4-6 hours as needed    . ASPIRIN 81 PO Take 81 mg by mouth daily.     Marland Kitchen atorvastatin (LIPITOR) 40 MG tablet Take 40 mg by mouth at bedtime.    Marland Kitchen CALQUENCE 100 MG capsule TAKE 1 CAPSULE (100 MG TOTAL) BY MOUTH 2 (TWO) TIMES DAILY. 60 capsule 2  . EASY TOUCH PEN NEEDLES 31G X 6 MM MISC Inject 1 application into the skin daily.    Marland Kitchen escitalopram (LEXAPRO) 20 MG tablet Take 20 mg by mouth daily.    . fluticasone furoate-vilanterol (BREO ELLIPTA) 200-25 MCG/INH AEPB Inhale 1 puff into the lungs daily. 28 each 1  . HUMALOG KWIKPEN 100 UNIT/ML KwikPen Inject 15 Units into the skin as directed.    . Insulin Detemir (LEVEMIR) 100 UNIT/ML Pen Inject 30 Units into the skin daily. (Patient taking differently: Inject 46 Units into the skin daily. ) 18 mL 0  . Insulin Pen Needle (PEN NEEDLES 3/16") 31G X 5 MM MISC Use as directed with insulin pen 100 each 0  . ipratropium-albuterol (DUONEB) 0.5-2.5 (3) MG/3ML SOLN Take 3 mLs by nebulization every 6 (six) hours as needed. J44.9 and J44.1 360 mL 0  . lisinopril (ZESTRIL) 40 MG tablet Take 40 mg by mouth daily.    . metFORMIN (GLUCOPHAGE-XR) 500 MG 24 hr tablet Take 1,000 mg by mouth 2 (two) times daily.    Marland Kitchen NOVOLOG FLEXPEN 100 UNIT/ML FlexPen Inject into the skin as directed.     . pantoprazole (  PROTONIX) 40 MG tablet Take 1 tablet (40 mg total) by mouth daily. 30 tablet 11  . VENTOLIN HFA 108 (90 Base) MCG/ACT inhaler Inhale 1-2 puffs into the lungs as directed.    . vitamin B-12 (CYANOCOBALAMIN) 1000 MCG tablet Take 1 tablet (1,000 mcg total) by mouth daily. 90 tablet 0   No current facility-administered medications for this visit.    SURGICAL HISTORY: No past surgical history on file.  REVIEW OF SYSTEMS:  Constitutional: positive for fatigue Eyes: negative Ears, nose, mouth, throat, and face: negative Respiratory: positive for dyspnea on exertion Cardiovascular: negative Gastrointestinal:  negative Genitourinary:negative Integument/breast: negative Hematologic/lymphatic: negative Musculoskeletal:positive for muscle weakness Neurological: negative Behavioral/Psych: negative Endocrine: negative Allergic/Immunologic: negative   PHYSICAL EXAMINATION: General appearance: alert, cooperative, fatigued and no distress Head: Normocephalic, without obvious abnormality, atraumatic Neck: no adenopathy, no JVD, supple, symmetrical, trachea midline and thyroid not enlarged, symmetric, no tenderness/mass/nodules Lymph nodes: Cervical, supraclavicular, and axillary nodes normal. Resp: clear to auscultation bilaterally Back: symmetric, no curvature. ROM normal. No CVA tenderness. Cardio: regular rate and rhythm, S1, S2 normal, no murmur, click, rub or gallop GI: soft, non-tender; bowel sounds normal; no masses,  no organomegaly Extremities: extremities normal, atraumatic, no cyanosis or edema Neurologic: Alert and oriented X 3, normal strength and tone. Normal symmetric reflexes. Normal coordination and gait  ECOG PERFORMANCE STATUS: 1 - Symptomatic but completely ambulatory  Blood pressure (!) 119/57, pulse 86, temperature 98.4 F (36.9 C), temperature source Tympanic, resp. rate 18, height 5\' 10"  (1.778 m), weight 201 lb (91.2 kg), SpO2 96 %.  LABORATORY DATA: Lab Results  Component Value Date   WBC 10.6 (H) 06/27/2020   HGB 13.7 06/27/2020   HCT 43.0 06/27/2020   MCV 85.0 06/27/2020   PLT 148 (L) 06/27/2020      Chemistry      Component Value Date/Time   NA 133 (L) 06/27/2020 0905   NA 137 07/06/2012 0506   K 5.2 (H) 06/27/2020 0905   K 4.0 07/06/2012 0506   CL 102 06/27/2020 0905   CL 104 07/06/2012 0506   CO2 23 06/27/2020 0905   CO2 25 07/06/2012 0506   BUN 21 (H) 06/27/2020 0905   BUN 12 07/06/2012 0506   CREATININE 1.35 (H) 06/27/2020 0905   CREATININE 0.62 07/06/2012 0506      Component Value Date/Time   CALCIUM 9.2 06/27/2020 0905   CALCIUM 8.6 07/06/2012  0506   ALKPHOS 79 06/27/2020 0905   ALKPHOS 96 07/06/2012 0506   AST 17 06/27/2020 0905   ALT 16 06/27/2020 0905   ALT 30 07/06/2012 0506   BILITOT 0.3 06/27/2020 0905       RADIOGRAPHIC STUDIES: No results found.  ASSESSMENT AND PLAN: This is a very pleasant 49 years old white male with CLL/small lymphocytic lymphoma diagnosed in 2010 and has been in observation for several years but the patient had significant disease progression and August 2020. He is currently undergoing treatment with obinutuzumab every 4 weeks in addition to acalabrutinib 100 mg p.o. twice daily.  Status post 12 cycles. The patient has been tolerating this treatment well with no concerning adverse effects.  He has a lot of medical issues in the last 2 months including COVID-19 infection as well as hypoglycemia and hypomagnesemia. He is still recovering from his recent infection. I recommended for the patient to delay the start of his treatment with obinutuzumab for now by another 4 weeks. He will continue his current treatment with acalabrutinib. I will see him  back for follow-up visit in 4 weeks for evaluation before resuming his treatment. He was advised to call immediately if he has any other concerning symptoms in the interval. The patient voices understanding of current disease status and treatment options and is in agreement with the current care plan.  All questions were answered. The patient knows to call the clinic with any problems, questions or concerns. We can certainly see the patient much sooner if necessary.   Disclaimer: This note was dictated with voice recognition software. Similar sounding words can inadvertently be transcribed and may not be corrected upon review.

## 2020-08-23 ENCOUNTER — Telehealth: Payer: Self-pay | Admitting: Internal Medicine

## 2020-08-23 NOTE — Telephone Encounter (Signed)
Scheduled per 12/28 los. Called and spoke with pt, confirmed 1/26 appts  °

## 2020-09-06 ENCOUNTER — Other Ambulatory Visit: Payer: Self-pay | Admitting: Internal Medicine

## 2020-09-06 DIAGNOSIS — C9110 Chronic lymphocytic leukemia of B-cell type not having achieved remission: Secondary | ICD-10-CM

## 2020-09-19 ENCOUNTER — Other Ambulatory Visit: Payer: Self-pay | Admitting: Medical Oncology

## 2020-09-19 ENCOUNTER — Ambulatory Visit: Payer: Medicare Other

## 2020-09-19 ENCOUNTER — Other Ambulatory Visit: Payer: Medicare Other

## 2020-09-19 ENCOUNTER — Ambulatory Visit: Payer: Medicare Other | Admitting: Internal Medicine

## 2020-09-19 DIAGNOSIS — C9110 Chronic lymphocytic leukemia of B-cell type not having achieved remission: Secondary | ICD-10-CM

## 2020-09-20 ENCOUNTER — Other Ambulatory Visit: Payer: Self-pay

## 2020-09-20 ENCOUNTER — Inpatient Hospital Stay: Payer: Medicare Other | Attending: Physician Assistant

## 2020-09-20 ENCOUNTER — Inpatient Hospital Stay: Payer: Medicare Other

## 2020-09-20 ENCOUNTER — Inpatient Hospital Stay (HOSPITAL_BASED_OUTPATIENT_CLINIC_OR_DEPARTMENT_OTHER): Payer: Medicare Other | Admitting: Internal Medicine

## 2020-09-20 VITALS — BP 154/67 | HR 100 | Temp 98.5°F | Resp 18 | Ht 70.0 in | Wt 208.9 lb

## 2020-09-20 DIAGNOSIS — C9110 Chronic lymphocytic leukemia of B-cell type not having achieved remission: Secondary | ICD-10-CM

## 2020-09-20 DIAGNOSIS — Z79899 Other long term (current) drug therapy: Secondary | ICD-10-CM | POA: Insufficient documentation

## 2020-09-20 DIAGNOSIS — Z794 Long term (current) use of insulin: Secondary | ICD-10-CM | POA: Insufficient documentation

## 2020-09-20 DIAGNOSIS — I10 Essential (primary) hypertension: Secondary | ICD-10-CM | POA: Insufficient documentation

## 2020-09-20 DIAGNOSIS — J449 Chronic obstructive pulmonary disease, unspecified: Secondary | ICD-10-CM | POA: Insufficient documentation

## 2020-09-20 DIAGNOSIS — Z7951 Long term (current) use of inhaled steroids: Secondary | ICD-10-CM | POA: Insufficient documentation

## 2020-09-20 DIAGNOSIS — Z7982 Long term (current) use of aspirin: Secondary | ICD-10-CM | POA: Insufficient documentation

## 2020-09-20 DIAGNOSIS — Z9221 Personal history of antineoplastic chemotherapy: Secondary | ICD-10-CM | POA: Insufficient documentation

## 2020-09-20 DIAGNOSIS — E119 Type 2 diabetes mellitus without complications: Secondary | ICD-10-CM | POA: Insufficient documentation

## 2020-09-20 LAB — CBC WITH DIFFERENTIAL (CANCER CENTER ONLY)
Abs Immature Granulocytes: 0.1 10*3/uL — ABNORMAL HIGH (ref 0.00–0.07)
Basophils Absolute: 0.1 10*3/uL (ref 0.0–0.1)
Basophils Relative: 1 %
Eosinophils Absolute: 0.3 10*3/uL (ref 0.0–0.5)
Eosinophils Relative: 2 %
HCT: 44.3 % (ref 39.0–52.0)
Hemoglobin: 13.8 g/dL (ref 13.0–17.0)
Immature Granulocytes: 1 %
Lymphocytes Relative: 21 %
Lymphs Abs: 3.4 10*3/uL (ref 0.7–4.0)
MCH: 28.2 pg (ref 26.0–34.0)
MCHC: 31.2 g/dL (ref 30.0–36.0)
MCV: 90.4 fL (ref 80.0–100.0)
Monocytes Absolute: 1.2 10*3/uL — ABNORMAL HIGH (ref 0.1–1.0)
Monocytes Relative: 7 %
Neutro Abs: 11 10*3/uL — ABNORMAL HIGH (ref 1.7–7.7)
Neutrophils Relative %: 68 %
Platelet Count: 299 10*3/uL (ref 150–400)
RBC: 4.9 MIL/uL (ref 4.22–5.81)
RDW: 15.1 % (ref 11.5–15.5)
WBC Count: 16.1 10*3/uL — ABNORMAL HIGH (ref 4.0–10.5)
nRBC: 0 % (ref 0.0–0.2)

## 2020-09-20 LAB — CMP (CANCER CENTER ONLY)
ALT: 14 U/L (ref 0–44)
AST: 10 U/L — ABNORMAL LOW (ref 15–41)
Albumin: 4 g/dL (ref 3.5–5.0)
Alkaline Phosphatase: 72 U/L (ref 38–126)
Anion gap: 11 (ref 5–15)
BUN: 10 mg/dL (ref 6–20)
CO2: 23 mmol/L (ref 22–32)
Calcium: 9.9 mg/dL (ref 8.9–10.3)
Chloride: 103 mmol/L (ref 98–111)
Creatinine: 1.03 mg/dL (ref 0.61–1.24)
GFR, Estimated: >60 mL/min (ref >60)
Glucose, Bld: 177 mg/dL — ABNORMAL HIGH (ref 70–99)
Potassium: 4.7 mmol/L (ref 3.5–5.1)
Sodium: 137 mmol/L (ref 135–145)
Total Bilirubin: 0.2 mg/dL — ABNORMAL LOW (ref 0.3–1.2)
Total Protein: 7 g/dL (ref 6.5–8.1)

## 2020-09-20 LAB — LACTATE DEHYDROGENASE: LDH: 151 U/L (ref 98–192)

## 2020-09-20 LAB — URIC ACID: Uric Acid, Serum: 6.5 mg/dL (ref 3.7–8.6)

## 2020-09-20 NOTE — Progress Notes (Signed)
[facility] [phone]   [phone]  OFFICE PROGRESS NOTE  [name], MD [place] [id number]  DIAGNOSIS: CLL/small lymphocytic lymphoma diagnosed in 2010 and has been in observation for several years with no treatment. 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 The cytogenetics were negative for P 17.   PRIOR THERAPY: None  CURRENT THERAPY: Obinutuzumab 1,000 mg IV. First dose on 08/10/2019. He will also begin acalabrutinib 100 mg BID starting on 08/19/2019.  Status post 12 cycles.  Obinutuzumab was discontinued after cycle #12 and the patient is currently on the oral acalabrutinib 100 mg p.o. twice daily.  INTERVAL HISTORY: Larry Lam 50 y.o. male returns to the clinic today for follow-up visit.  The patient is feeling fine today with no concerning complaints.  He denied having any current chest pain, shortness of breath except with exertion with no cough or hemoptysis.  He denied having any fever or chills.  He has no nausea, vomiting, diarrhea or constipation.  He has no headache or visual changes.  He continues to tolerate his treatment with acalabrutinib fairly well.  The patient is here today for evaluation and repeat blood work.  MEDICAL HISTORY: Past Medical History:  Diagnosis Date  . Asthma   . cll dx'd 04/2019  . COPD (chronic obstructive pulmonary disease) ([place])   . Diabetes mellitus without complication ([place])   . Hypertension   . Seizures ([place])    3-4 years ago. Single episode.  . Tobacco use     ALLERGIES:  is allergic to amlodipine besylate-valsartan and hydrochlorothiazide.  MEDICATIONS:  Current Outpatient Medications  Medication Sig Dispense Refill  . albuterol (PROVENTIL) (2.5 MG/3ML) 0.083% nebulizer solution Take 1 mL by nebulization as needed. Every 4-6 hours as needed    . ASPIRIN 81 PO Take 81 mg by mouth daily.     Marland Kitchen atorvastatin  (LIPITOR) 40 MG tablet Take 40 mg by mouth at bedtime.    Marland Kitchen CALQUENCE 100 MG capsule TAKE 1 CAPSULE (100 MG TOTAL) BY MOUTH 2 (TWO) TIMES DAILY. 60 capsule 2  . EASY TOUCH PEN NEEDLES 31G X 6 MM MISC Inject 1 application into the skin daily.    Marland Kitchen escitalopram (LEXAPRO) 20 MG tablet Take 20 mg by mouth daily.    . fluticasone furoate-vilanterol (BREO ELLIPTA) 200-25 MCG/INH AEPB Inhale 1 puff into the lungs daily. 28 each 1  . HUMALOG KWIKPEN 100 UNIT/ML KwikPen Inject 15 Units into the skin as directed.    . Insulin Detemir (LEVEMIR) 100 UNIT/ML Pen Inject 30 Units into the skin daily. (Patient taking differently: Inject 46 Units into the skin daily. ) 18 mL 0  . Insulin Pen Needle (PEN NEEDLES 3/16") 31G X 5 MM MISC Use as directed with insulin pen 100 each 0  . ipratropium-albuterol (DUONEB) 0.5-2.5 (3) MG/3ML SOLN Take 3 mLs by nebulization every 6 (six) hours as needed. J44.9 and J44.1 360 mL 0  . lisinopril (ZESTRIL) 40 MG tablet Take 40 mg by mouth daily.    . metFORMIN (GLUCOPHAGE-XR) 500 MG 24 hr tablet Take 1,000 mg by mouth 2 (two) times daily.    Marland Kitchen NOVOLOG FLEXPEN 100 UNIT/ML FlexPen Inject into the skin as directed.     . pantoprazole (PROTONIX) 40 MG tablet Take 1 tablet (40 mg total) by mouth daily. 30 tablet 11  . VENTOLIN HFA 108 (90 Base) MCG/ACT inhaler Inhale 1-2  puffs into the lungs as directed.    . vitamin B-12 (CYANOCOBALAMIN) 1000 MCG tablet Take 1 tablet (1,000 mcg total) by mouth daily. 90 tablet 0   No current facility-administered medications for this visit.    SURGICAL HISTORY: No past surgical history on file.  REVIEW OF SYSTEMS:  A comprehensive review of systems was negative except for: Constitutional: positive for fatigue Respiratory: positive for dyspnea on exertion   PHYSICAL EXAMINATION: General appearance: alert, cooperative, fatigued and no distress Head: Normocephalic, without obvious abnormality, atraumatic Neck: no adenopathy, no JVD, supple,  symmetrical, trachea midline and thyroid not enlarged, symmetric, no tenderness/mass/nodules Lymph nodes: Cervical, supraclavicular, and axillary nodes normal. Resp: clear to auscultation bilaterally Back: symmetric, no curvature. ROM normal. No CVA tenderness. Cardio: regular rate and rhythm, S1, S2 normal, no murmur, click, rub or gallop GI: soft, non-tender; bowel sounds normal; no masses,  no organomegaly Extremities: extremities normal, atraumatic, no cyanosis or edema  ECOG PERFORMANCE STATUS: 1 - Symptomatic but completely ambulatory  Blood pressure (!) 154/67, pulse 100, temperature 98.5 F (36.9 C), temperature source Tympanic, resp. rate 18, height 5\' 10"  (1.778 m), weight 208 lb 14.4 oz (94.8 kg), SpO2 100 %.  LABORATORY DATA: Lab Results  Component Value Date   WBC 16.1 (H) 09/20/2020   HGB 13.8 09/20/2020   HCT 44.3 09/20/2020   MCV 90.4 09/20/2020   PLT 299 09/20/2020      Chemistry      Component Value Date/Time   NA 137 08/22/2020 0910   NA 137 07/06/2012 0506   K 5.2 (H) 08/22/2020 0910   K 4.0 07/06/2012 0506   CL 102 08/22/2020 0910   CL 104 07/06/2012 0506   CO2 26 08/22/2020 0910   CO2 25 07/06/2012 0506   BUN 16 08/22/2020 0910   BUN 12 07/06/2012 0506   CREATININE 0.97 08/22/2020 0910   CREATININE 0.62 07/06/2012 0506      Component Value Date/Time   CALCIUM 10.2 08/22/2020 0910   CALCIUM 8.6 07/06/2012 0506   ALKPHOS 55 08/22/2020 0910   ALKPHOS 96 07/06/2012 0506   AST 11 (L) 08/22/2020 0910   ALT 14 08/22/2020 0910   ALT 30 07/06/2012 0506   BILITOT 0.3 08/22/2020 0910       RADIOGRAPHIC STUDIES: No results found.  ASSESSMENT AND PLAN: This is a very pleasant 50 years old white male with CLL/small lymphocytic lymphoma diagnosed in 2010 and has been in observation for several years but the patient had significant disease progression and August 2020. He is currently undergoing treatment with obinutuzumab every 4 weeks in addition to  acalabrutinib 100 mg p.o. twice daily.  Status post 12 cycles. Obinutuzumab was discontinued after cycle #12 and the patient is currently on treatment with acalabrutinib 100 mg p.o. twice daily and tolerating it fairly well. I recommended for the patient to continue his current treatment with the oral acalabrutinib for now. I will see him back for follow-up visit in 1 months for evaluation with repeat CT scan of the chest, abdomen pelvis for restaging of his disease. The patient was advised to call immediately if he has any other concerning symptoms in the interval.  The patient voices understanding of current disease status and treatment options and is in agreement with the current care plan.  All questions were answered. The patient knows to call the clinic with any problems, questions or concerns. We can certainly see the patient much sooner if necessary.   Disclaimer: This note was dictated  with voice recognition software. Similar sounding words can inadvertently be transcribed and may not be corrected upon review.

## 2020-09-25 ENCOUNTER — Telehealth: Payer: Self-pay | Admitting: Internal Medicine

## 2020-09-25 NOTE — Telephone Encounter (Signed)
Informed patient of his upcoming appointment. Patient is aware. 

## 2020-10-16 ENCOUNTER — Other Ambulatory Visit: Payer: Self-pay

## 2020-10-16 ENCOUNTER — Encounter (HOSPITAL_COMMUNITY): Payer: Self-pay

## 2020-10-16 ENCOUNTER — Ambulatory Visit (HOSPITAL_COMMUNITY)
Admission: RE | Admit: 2020-10-16 | Discharge: 2020-10-16 | Disposition: A | Discharge status: Home / Self Care | Payer: Medicare Other | Source: Ambulatory Visit | Attending: Physician Assistant | Admitting: Physician Assistant

## 2020-10-16 ENCOUNTER — Inpatient Hospital Stay: Payer: Medicare Other | Attending: Physician Assistant

## 2020-10-16 ENCOUNTER — Other Ambulatory Visit: Payer: Medicare Other

## 2020-10-16 DIAGNOSIS — J449 Chronic obstructive pulmonary disease, unspecified: Secondary | ICD-10-CM | POA: Diagnosis not present

## 2020-10-16 DIAGNOSIS — I10 Essential (primary) hypertension: Secondary | ICD-10-CM | POA: Insufficient documentation

## 2020-10-16 DIAGNOSIS — C9110 Chronic lymphocytic leukemia of B-cell type not having achieved remission: Secondary | ICD-10-CM | POA: Insufficient documentation

## 2020-10-16 DIAGNOSIS — Z7982 Long term (current) use of aspirin: Secondary | ICD-10-CM | POA: Diagnosis not present

## 2020-10-16 DIAGNOSIS — Z794 Long term (current) use of insulin: Secondary | ICD-10-CM | POA: Diagnosis not present

## 2020-10-16 DIAGNOSIS — R590 Localized enlarged lymph nodes: Secondary | ICD-10-CM | POA: Insufficient documentation

## 2020-10-16 DIAGNOSIS — Z79899 Other long term (current) drug therapy: Secondary | ICD-10-CM | POA: Diagnosis not present

## 2020-10-16 DIAGNOSIS — E119 Type 2 diabetes mellitus without complications: Secondary | ICD-10-CM | POA: Insufficient documentation

## 2020-10-16 LAB — CMP (CANCER CENTER ONLY)
ALT: 13 U/L (ref 0–44)
AST: 11 U/L — ABNORMAL LOW (ref 15–41)
Albumin: 4.2 g/dL (ref 3.5–5.0)
Alkaline Phosphatase: 65 U/L (ref 38–126)
Anion gap: 11 (ref 5–15)
BUN: 15 mg/dL (ref 6–20)
CO2: 24 mmol/L (ref 22–32)
Calcium: 9.6 mg/dL (ref 8.9–10.3)
Chloride: 102 mmol/L (ref 98–111)
Creatinine: 1.04 mg/dL (ref 0.61–1.24)
GFR, Estimated: >60 mL/min (ref >60)
Glucose, Bld: 130 mg/dL — ABNORMAL HIGH (ref 70–99)
Potassium: 4.6 mmol/L (ref 3.5–5.1)
Sodium: 137 mmol/L (ref 135–145)
Total Bilirubin: <0.2 mg/dL — ABNORMAL LOW (ref 0.3–1.2)
Total Protein: 7 g/dL (ref 6.5–8.1)

## 2020-10-16 LAB — CBC WITH DIFFERENTIAL (CANCER CENTER ONLY)
Abs Immature Granulocytes: 0.11 10*3/uL — ABNORMAL HIGH (ref 0.00–0.07)
Basophils Absolute: 0.1 10*3/uL (ref 0.0–0.1)
Basophils Relative: 1 %
Eosinophils Absolute: 0.2 10*3/uL (ref 0.0–0.5)
Eosinophils Relative: 1 %
HCT: 43.7 % (ref 39.0–52.0)
Hemoglobin: 14 g/dL (ref 13.0–17.0)
Immature Granulocytes: 1 %
Lymphocytes Relative: 15 %
Lymphs Abs: 2.9 10*3/uL (ref 0.7–4.0)
MCH: 28.3 pg (ref 26.0–34.0)
MCHC: 32 g/dL (ref 30.0–36.0)
MCV: 88.3 fL (ref 80.0–100.0)
Monocytes Absolute: 1.4 10*3/uL — ABNORMAL HIGH (ref 0.1–1.0)
Monocytes Relative: 7 %
Neutro Abs: 14.6 10*3/uL — ABNORMAL HIGH (ref 1.7–7.7)
Neutrophils Relative %: 75 %
Platelet Count: 255 10*3/uL (ref 150–400)
RBC: 4.95 MIL/uL (ref 4.22–5.81)
RDW: 14.2 % (ref 11.5–15.5)
WBC Count: 19.2 10*3/uL — ABNORMAL HIGH (ref 4.0–10.5)
nRBC: 0 % (ref 0.0–0.2)

## 2020-10-16 LAB — LACTATE DEHYDROGENASE: LDH: 109 U/L (ref 98–192)

## 2020-10-16 LAB — URIC ACID: Uric Acid, Serum: 6.4 mg/dL (ref 3.7–8.6)

## 2020-10-16 MED ORDER — IOHEXOL 300 MG/ML  SOLN
100.0000 mL | Freq: Once | INTRAMUSCULAR | Status: AC | PRN
  Administered 2020-10-16: 100 mL via INTRAVENOUS

## 2020-10-17 ENCOUNTER — Ambulatory Visit: Payer: Medicare Other

## 2020-10-17 ENCOUNTER — Inpatient Hospital Stay (HOSPITAL_BASED_OUTPATIENT_CLINIC_OR_DEPARTMENT_OTHER): Payer: Medicare Other | Admitting: Internal Medicine

## 2020-10-17 ENCOUNTER — Other Ambulatory Visit: Payer: Medicare Other

## 2020-10-17 ENCOUNTER — Ambulatory Visit: Payer: Medicare Other | Admitting: Internal Medicine

## 2020-10-17 ENCOUNTER — Other Ambulatory Visit: Payer: Self-pay

## 2020-10-17 ENCOUNTER — Inpatient Hospital Stay: Payer: Medicare Other

## 2020-10-17 VITALS — BP 144/62 | HR 91 | Temp 98.2°F | Resp 17 | Ht 70.0 in | Wt 214.4 lb

## 2020-10-17 DIAGNOSIS — Z23 Encounter for immunization: Secondary | ICD-10-CM

## 2020-10-17 DIAGNOSIS — C9110 Chronic lymphocytic leukemia of B-cell type not having achieved remission: Secondary | ICD-10-CM | POA: Diagnosis not present

## 2020-10-17 DIAGNOSIS — Z5111 Encounter for antineoplastic chemotherapy: Secondary | ICD-10-CM | POA: Diagnosis not present

## 2020-10-17 NOTE — Progress Notes (Signed)
[place] [phone]   [phone]) [phone]  OFFICE PROGRESS NOTE  [name], MD [place] [address]  DIAGNOSIS: CLL/small lymphocytic lymphoma diagnosed in 2010 and has been in observation for several years with no treatment. 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 The cytogenetics were negative for P 17.   PRIOR THERAPY: None  CURRENT THERAPY: Obinutuzumab 1,000 mg IV. First dose on 08/10/2019. He will also begin acalabrutinib 100 mg BID starting on 08/19/2019.  Status post 12 cycles.  Obinutuzumab was discontinued after cycle #12 and the patient is currently on the oral acalabrutinib 100 mg p.o. twice daily.  INTERVAL HISTORY: Larry Lam 50 y.o. male returns to the clinic today for follow-up visit.  The patient is feeling fine today with no concerning complaints.  He denied having any current chest pain, shortness of breath, cough or hemoptysis.  He denied having any fever or chills.  He has no nausea, vomiting, diarrhea or constipation.  He has no headache or visual changes.  He has no recent weight loss or night sweats.  He continues to tolerate his treatment with acalabrutinib fairly well.  The patient had repeat blood work as well as CT scan of the chest, abdomen pelvis performed recently and he is here for evaluation and discussion of his scan results.  MEDICAL HISTORY: Past Medical History:  Diagnosis Date  . Asthma   . cll dx'd 04/2019  . COPD (chronic obstructive pulmonary disease) ([place])   . Diabetes mellitus without complication ([place])   . Hypertension   . Seizures ([place])    3-4 years ago. Single episode.  . Tobacco use     ALLERGIES:  is allergic to amlodipine besylate-valsartan and hydrochlorothiazide.  MEDICATIONS:  Current Outpatient Medications  Medication Sig Dispense Refill  . albuterol (PROVENTIL) (2.5 MG/3ML) 0.083% nebulizer solution Take 1  mL by nebulization as needed. Every 4-6 hours as needed    . amLODipine (NORVASC) 10 MG tablet Take 10 mg by mouth daily.    . ASPIRIN 81 PO Take 81 mg by mouth daily.     Marland Kitchen atorvastatin (LIPITOR) 40 MG tablet Take 40 mg by mouth at bedtime.    Marland Kitchen CALQUENCE 100 MG capsule TAKE 1 CAPSULE (100 MG TOTAL) BY MOUTH 2 (TWO) TIMES DAILY. 60 capsule 2  . carvedilol (COREG) 12.5 MG tablet Take 12.5 mg by mouth 2 (two) times daily.    Marland Kitchen EASY TOUCH PEN NEEDLES 31G X 6 MM MISC Inject 1 application into the skin daily.    Marland Kitchen escitalopram (LEXAPRO) 20 MG tablet Take 20 mg by mouth daily.    . fluticasone furoate-vilanterol (BREO ELLIPTA) 200-25 MCG/INH AEPB Inhale 1 puff into the lungs daily. 28 each 1  . HUMALOG KWIKPEN 100 UNIT/ML KwikPen Inject 15 Units into the skin as directed.    . Insulin Detemir (LEVEMIR) 100 UNIT/ML Pen Inject 30 Units into the skin daily. (Patient taking differently: Inject 46 Units into the skin daily. ) 18 mL 0  . Insulin Pen Needle (PEN NEEDLES 3/16") 31G X 5 MM MISC Use as directed with insulin pen 100 each 0  . ipratropium-albuterol (DUONEB) 0.5-2.5 (3) MG/3ML SOLN Take 3 mLs by nebulization every 6 (six) hours as needed. J44.9 and J44.1 360 mL 0  . lisinopril (ZESTRIL) 40 MG tablet Take 40 mg by mouth daily.    . metFORMIN (GLUCOPHAGE) 1000 MG tablet Take 1,000  mg by mouth 2 (two) times daily.    . metFORMIN (GLUCOPHAGE-XR) 500 MG 24 hr tablet Take 1,000 mg by mouth 2 (two) times daily.    . metoprolol succinate (TOPROL-XL) 25 MG 24 hr tablet Take 25 mg by mouth daily.    Marland Kitchen NOVOLOG FLEXPEN 100 UNIT/ML FlexPen Inject into the skin as directed.     . pantoprazole (PROTONIX) 40 MG tablet Take 1 tablet (40 mg total) by mouth daily. 30 tablet 11  . VENTOLIN HFA 108 (90 Base) MCG/ACT inhaler Inhale 1-2 puffs into the lungs as directed.    . vitamin B-12 (CYANOCOBALAMIN) 1000 MCG tablet Take 1 tablet (1,000 mcg total) by mouth daily. 90 tablet 0   No current facility-administered  medications for this visit.    SURGICAL HISTORY: No past surgical history on file.  REVIEW OF SYSTEMS:  Constitutional: negative Eyes: negative Ears, nose, mouth, throat, and face: negative Respiratory: negative Cardiovascular: negative Gastrointestinal: negative Genitourinary:negative Integument/breast: negative Hematologic/lymphatic: negative Musculoskeletal:negative Neurological: negative Behavioral/Psych: negative Endocrine: negative Allergic/Immunologic: negative   PHYSICAL EXAMINATION: General appearance: alert, cooperative, fatigued and no distress Head: Normocephalic, without obvious abnormality, atraumatic Neck: no adenopathy, no JVD, supple, symmetrical, trachea midline and thyroid not enlarged, symmetric, no tenderness/mass/nodules Lymph nodes: Cervical, supraclavicular, and axillary nodes normal. Resp: clear to auscultation bilaterally Back: symmetric, no curvature. ROM normal. No CVA tenderness. Cardio: regular rate and rhythm, S1, S2 normal, no murmur, click, rub or gallop GI: soft, non-tender; bowel sounds normal; no masses,  no organomegaly Extremities: extremities normal, atraumatic, no cyanosis or edema Neurologic: Alert and oriented X 3, normal strength and tone. Normal symmetric reflexes. Normal coordination and gait  ECOG PERFORMANCE STATUS: 1 - Symptomatic but completely ambulatory  Blood pressure (!) 144/62, pulse 91, temperature 98.2 F (36.8 C), temperature source Tympanic, resp. rate 17, height 5\' 10"  (1.778 m), weight 214 lb 6.4 oz (97.3 kg), SpO2 99 %.  LABORATORY DATA: Lab Results  Component Value Date   WBC 19.2 (H) 10/16/2020   HGB 14.0 10/16/2020   HCT 43.7 10/16/2020   MCV 88.3 10/16/2020   PLT 255 10/16/2020      Chemistry      Component Value Date/Time   NA 137 10/16/2020 0809   NA 137 07/06/2012 0506   K 4.6 10/16/2020 0809   K 4.0 07/06/2012 0506   CL 102 10/16/2020 0809   CL 104 07/06/2012 0506   CO2 24 10/16/2020 0809    CO2 25 07/06/2012 0506   BUN 15 10/16/2020 0809   BUN 12 07/06/2012 0506   CREATININE 1.04 10/16/2020 0809   CREATININE 0.62 07/06/2012 0506      Component Value Date/Time   CALCIUM 9.6 10/16/2020 0809   CALCIUM 8.6 07/06/2012 0506   ALKPHOS 65 10/16/2020 0809   ALKPHOS 96 07/06/2012 0506   AST 11 (L) 10/16/2020 0809   ALT 13 10/16/2020 0809   ALT 30 07/06/2012 0506   BILITOT <0.2 (L) 10/16/2020 0809       RADIOGRAPHIC STUDIES: CT Chest W Contrast  Result Date: 10/16/2020 CLINICAL DATA:  Follow-up chronic lymphocytic leukemia diagnosed in September 2020. Chemotherapy ongoing. EXAM: CT CHEST, ABDOMEN, AND PELVIS WITH CONTRAST TECHNIQUE: Multidetector CT imaging of the chest, abdomen and pelvis was performed following the standard protocol during bolus administration of intravenous contrast. CONTRAST:  1105mL OMNIPAQUE IOHEXOL 300 MG/ML  SOLN COMPARISON:  CTs 01/21/2020 and 11/01/2019. FINDINGS: CT CHEST FINDINGS Cardiovascular: No acute vascular findings. The pulmonary arteries appear patent. There is mild atherosclerosis of  the aorta and coronary arteries. The heart size is normal. There is no pericardial effusion. Mediastinum/Nodes: There are no enlarged mediastinal, hilar or axillary lymph nodes. Previously identified prominent mediastinal, hilar and axillary lymph nodes have decreased in size. For example, a right axillary node which previously measured 1.1 cm now measures 0.8 cm on image 16/2. A left axillary node which previously measured 1.2 cm now measures 0.6 cm on image 18/2. A right hilar node which previously measured 1.1 cm now measures 1.1 cm on image 25/2. No progressive adenopathy. Stable coarse calcifications within the thyroid isthmus. The trachea and esophagus appear unremarkable. Lungs/Pleura: No pleural effusion or pneumothorax. There is stable chronic central airway thickening. There is a new small nodule centrally in the left upper lobe, measuring 5 mm on image 57/7.  Additional smaller clustered nodules are present more peripherally in the left upper lobe, some of which appear partially calcified. These are likely inflammatory. Musculoskeletal/Chest wall: No chest wall mass or suspicious osseous findings. CT ABDOMEN AND PELVIS FINDINGS Hepatobiliary: The liver is normal in density without suspicious focal abnormality. No evidence of gallstones, gallbladder wall thickening or biliary dilatation. Pancreas: Unremarkable. No pancreatic ductal dilatation or surrounding inflammatory changes. Spleen: Normal in size without focal abnormality. Adrenals/Urinary Tract: Both adrenal glands appear normal. The kidneys appear normal without evidence of urinary tract calculus, suspicious lesion or hydronephrosis. No bladder abnormalities are seen. Stomach/Bowel: The stomach appears unremarkable for its degree of distension. No evidence of bowel wall thickening, distention or surrounding inflammatory change. The appendix appears normal. Vascular/Lymphatic: There are no enlarged abdominal or pelvic lymph nodes. Previously noted prominent inguinal lymph nodes demonstrate further decrease in size. There is diffuse aortic and branch vessel atherosclerosis without acute vascular findings. The portal, superior mesenteric and splenic veins are patent. Reproductive: The prostate gland and seminal vesicles appear normal. Other: No evidence of abdominal wall mass or hernia. No ascites. Musculoskeletal: No acute or focal osseous lesions are identified. 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 There are mild degenerative changes in the hips and lower lumbar spine. IMPRESSION: 1. Interval decreased size of previously identified prominent mediastinal, hilar and axillary lymph nodes, consistent with a positive response to therapy. No residual enlarged lymph nodes or progressive adenopathy. 2. New small clustered nodules centrally in the left upper lobe,  likely inflammatory. Suggest attention on routine follow-up within 1 year. 3. 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 4. Aortic Atherosclerosis (ICD10-I70.0). Electronically Signed   By: Richardean Sale M.D.   On: 10/16/2020 12:56   CT Abdomen Pelvis W Contrast  Result Date: 10/16/2020 CLINICAL DATA:  Follow-up chronic lymphocytic leukemia diagnosed in September 2020. Chemotherapy ongoing. EXAM: CT CHEST, ABDOMEN, AND PELVIS WITH CONTRAST TECHNIQUE: Multidetector CT imaging of the chest, abdomen and pelvis was performed following the standard protocol during bolus administration of intravenous contrast. CONTRAST:  176mL OMNIPAQUE IOHEXOL 300 MG/ML  SOLN COMPARISON:  CTs 01/21/2020 and 11/01/2019. FINDINGS: CT CHEST FINDINGS Cardiovascular: No acute vascular findings. The pulmonary arteries appear patent. There is mild atherosclerosis of the aorta and coronary arteries. The heart size is normal. There is no pericardial effusion. Mediastinum/Nodes: There are no enlarged mediastinal, hilar or axillary lymph nodes. Previously identified prominent mediastinal, hilar and axillary lymph nodes have decreased in size. For example, a right axillary node which previously measured 1.1 cm now measures 0.8 cm on image 16/2. A left axillary node which previously measured 1.2 cm now measures 0.6 cm  on image 18/2. A right hilar node which previously measured 1.1 cm now measures 1.1 cm on image 25/2. No progressive adenopathy. Stable coarse calcifications within the thyroid isthmus. The trachea and esophagus appear unremarkable. Lungs/Pleura: No pleural effusion or pneumothorax. There is stable chronic central airway thickening. There is a new small nodule centrally in the left upper lobe, measuring 5 mm on image 57/7. Additional smaller clustered nodules are present more peripherally in the left upper lobe, some of which appear partially calcified. These are likely  inflammatory. Musculoskeletal/Chest wall: No chest wall mass or suspicious osseous findings. CT ABDOMEN AND PELVIS FINDINGS Hepatobiliary: The liver is normal in density without suspicious focal abnormality. No evidence of gallstones, gallbladder wall thickening or biliary dilatation. Pancreas: Unremarkable. No pancreatic ductal dilatation or surrounding inflammatory changes. Spleen: Normal in size without focal abnormality. Adrenals/Urinary Tract: Both adrenal glands appear normal. The kidneys appear normal without evidence of urinary tract calculus, suspicious lesion or hydronephrosis. No bladder abnormalities are seen. Stomach/Bowel: The stomach appears unremarkable for its degree of distension. No evidence of bowel wall thickening, distention or surrounding inflammatory change. The appendix appears normal. Vascular/Lymphatic: There are no enlarged abdominal or pelvic lymph nodes. Previously noted prominent inguinal lymph nodes demonstrate further decrease in size. There is diffuse aortic and branch vessel atherosclerosis without acute vascular findings. The portal, superior mesenteric and splenic veins are patent. Reproductive: The prostate gland and seminal vesicles appear normal. Other: No evidence of abdominal wall mass or hernia. No ascites. Musculoskeletal: No acute or focal osseous lesions are identified. 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 There are mild degenerative changes in the hips and lower lumbar spine. IMPRESSION: 1. Interval decreased size of previously identified prominent mediastinal, hilar and axillary lymph nodes, consistent with a positive response to therapy. No residual enlarged lymph nodes or progressive adenopathy. 2. New small clustered nodules centrally in the left upper lobe, likely inflammatory. Suggest attention on routine follow-up within 1 year. 3. 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 4. Aortic Atherosclerosis (ICD10-I70.0). Electronically Signed   By: Richardean Sale M.D.   On: 10/16/2020 12:56    ASSESSMENT AND PLAN: This is a very pleasant 50 years old white male with CLL/small lymphocytic lymphoma diagnosed in 2010 and has been in observation for several years but the patient had significant disease progression and August 2020. He is currently undergoing treatment with obinutuzumab every 4 weeks in addition to acalabrutinib 100 mg p.o. twice daily.  Status post 12 cycles. Obinutuzumab was discontinued after cycle #12.He is currently on treatment with acalabrutinib 100 mg p.o. twice daily and has been tolerating this treatment well with no concerning adverse effects. The patient had repeat CT scan of the chest, abdomen pelvis performed recently.  I personally and independently reviewed the scan and discussed the results with the patient today. His scan showed further improvement of his disease with no concerning findings for progression. I recommended for the patient to continue his current treatment with acalabrutinib with the same dose. I will see him back for follow-up visit in 1 months for evaluation and repeat blood work. The patient interested in receiving the first dose of his Covid vaccine today and will check his eligibility especially after his Covid infection in December and if appropriate he will receive his first dose of the Covid vaccine today. The patient was advised to call immediately if he has any other concerning symptoms in the interval. The patient  voices understanding of current disease status and treatment options and is in agreement with the current care plan.  All questions were answered. The patient knows to call the clinic with any problems, questions or concerns. We can certainly see the patient much sooner if necessary.   Disclaimer: This note was dictated with voice recognition software. Similar sounding words can  inadvertently be transcribed and may not be corrected upon review.

## 2020-10-18 ENCOUNTER — Telehealth: Payer: Self-pay | Admitting: Internal Medicine

## 2020-10-18 NOTE — Telephone Encounter (Signed)
Scheduled appointments per 2/22 los. Spoke to patient who is aware of appointments date and times.  

## 2020-11-07 ENCOUNTER — Inpatient Hospital Stay: Payer: Medicare Other | Attending: Physician Assistant

## 2020-11-07 ENCOUNTER — Other Ambulatory Visit: Payer: Self-pay

## 2020-11-07 DIAGNOSIS — C9110 Chronic lymphocytic leukemia of B-cell type not having achieved remission: Secondary | ICD-10-CM | POA: Insufficient documentation

## 2020-11-07 DIAGNOSIS — Z79899 Other long term (current) drug therapy: Secondary | ICD-10-CM | POA: Insufficient documentation

## 2020-11-07 DIAGNOSIS — E119 Type 2 diabetes mellitus without complications: Secondary | ICD-10-CM | POA: Insufficient documentation

## 2020-11-07 DIAGNOSIS — Z794 Long term (current) use of insulin: Secondary | ICD-10-CM | POA: Insufficient documentation

## 2020-11-07 DIAGNOSIS — Z7951 Long term (current) use of inhaled steroids: Secondary | ICD-10-CM | POA: Insufficient documentation

## 2020-11-07 DIAGNOSIS — Z23 Encounter for immunization: Secondary | ICD-10-CM

## 2020-11-07 DIAGNOSIS — Z7982 Long term (current) use of aspirin: Secondary | ICD-10-CM | POA: Insufficient documentation

## 2020-11-07 DIAGNOSIS — I10 Essential (primary) hypertension: Secondary | ICD-10-CM | POA: Insufficient documentation

## 2020-11-14 ENCOUNTER — Inpatient Hospital Stay: Payer: Medicare Other

## 2020-11-14 ENCOUNTER — Inpatient Hospital Stay (HOSPITAL_BASED_OUTPATIENT_CLINIC_OR_DEPARTMENT_OTHER): Payer: Medicare Other | Admitting: Internal Medicine

## 2020-11-14 ENCOUNTER — Other Ambulatory Visit: Payer: Self-pay

## 2020-11-14 VITALS — BP 136/59 | HR 85 | Temp 98.0°F | Resp 16 | Ht 70.0 in | Wt 213.5 lb

## 2020-11-14 DIAGNOSIS — Z7982 Long term (current) use of aspirin: Secondary | ICD-10-CM | POA: Diagnosis not present

## 2020-11-14 DIAGNOSIS — Z7951 Long term (current) use of inhaled steroids: Secondary | ICD-10-CM | POA: Diagnosis not present

## 2020-11-14 DIAGNOSIS — E119 Type 2 diabetes mellitus without complications: Secondary | ICD-10-CM | POA: Diagnosis not present

## 2020-11-14 DIAGNOSIS — C9110 Chronic lymphocytic leukemia of B-cell type not having achieved remission: Secondary | ICD-10-CM

## 2020-11-14 DIAGNOSIS — Z794 Long term (current) use of insulin: Secondary | ICD-10-CM | POA: Diagnosis not present

## 2020-11-14 DIAGNOSIS — Z79899 Other long term (current) drug therapy: Secondary | ICD-10-CM | POA: Diagnosis not present

## 2020-11-14 DIAGNOSIS — I10 Essential (primary) hypertension: Secondary | ICD-10-CM | POA: Diagnosis not present

## 2020-11-14 LAB — CBC WITH DIFFERENTIAL (CANCER CENTER ONLY)
Abs Immature Granulocytes: 0.07 10*3/uL (ref 0.00–0.07)
Basophils Absolute: 0.1 10*3/uL (ref 0.0–0.1)
Basophils Relative: 1 %
Eosinophils Absolute: 0.2 10*3/uL (ref 0.0–0.5)
Eosinophils Relative: 1 %
HCT: 42.1 % (ref 39.0–52.0)
Hemoglobin: 13.5 g/dL (ref 13.0–17.0)
Immature Granulocytes: 1 %
Lymphocytes Relative: 36 %
Lymphs Abs: 4.2 10*3/uL — ABNORMAL HIGH (ref 0.7–4.0)
MCH: 27.8 pg (ref 26.0–34.0)
MCHC: 32.1 g/dL (ref 30.0–36.0)
MCV: 86.6 fL (ref 80.0–100.0)
Monocytes Absolute: 0.8 10*3/uL (ref 0.1–1.0)
Monocytes Relative: 7 %
Neutro Abs: 6.5 10*3/uL (ref 1.7–7.7)
Neutrophils Relative %: 54 %
Platelet Count: 221 10*3/uL (ref 150–400)
RBC: 4.86 MIL/uL (ref 4.22–5.81)
RDW: 14.2 % (ref 11.5–15.5)
WBC Count: 11.9 10*3/uL — ABNORMAL HIGH (ref 4.0–10.5)
nRBC: 0 % (ref 0.0–0.2)

## 2020-11-14 LAB — CMP (CANCER CENTER ONLY)
ALT: 29 U/L (ref 0–44)
AST: 14 U/L — ABNORMAL LOW (ref 15–41)
Albumin: 4.1 g/dL (ref 3.5–5.0)
Alkaline Phosphatase: 74 U/L (ref 38–126)
Anion gap: 10 (ref 5–15)
BUN: 24 mg/dL — ABNORMAL HIGH (ref 6–20)
CO2: 24 mmol/L (ref 22–32)
Calcium: 9.6 mg/dL (ref 8.9–10.3)
Chloride: 103 mmol/L (ref 98–111)
Creatinine: 1.53 mg/dL — ABNORMAL HIGH (ref 0.61–1.24)
GFR, Estimated: 55 mL/min — ABNORMAL LOW (ref >60)
Glucose, Bld: 178 mg/dL — ABNORMAL HIGH (ref 70–99)
Potassium: 5.8 mmol/L — ABNORMAL HIGH (ref 3.5–5.1)
Sodium: 137 mmol/L (ref 135–145)
Total Bilirubin: 0.3 mg/dL (ref 0.3–1.2)
Total Protein: 6.7 g/dL (ref 6.5–8.1)

## 2020-11-14 LAB — LACTATE DEHYDROGENASE: LDH: 125 U/L (ref 98–192)

## 2020-11-14 LAB — URIC ACID: Uric Acid, Serum: 8.4 mg/dL (ref 3.7–8.6)

## 2020-11-14 NOTE — Addendum Note (Signed)
Addended by: Ardeen Garland on: 11/14/2020 10:02 AM   Modules accepted: Orders

## 2020-11-14 NOTE — Progress Notes (Signed)
[place] [phone]   [phone]  OFFICE PROGRESS NOTE  Larry Coffin, MD [place] [address]  DIAGNOSIS: CLL/small lymphocytic lymphoma diagnosed in 2010 and has been in observation for several years with no treatment. 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 The cytogenetics were negative for P 17.   PRIOR THERAPY: None  CURRENT THERAPY: Obinutuzumab 1,000 mg IV. First dose on 08/10/2019. He will also begin acalabrutinib 100 mg BID starting on 08/19/2019.  Status post 13 cycles.  Obinutuzumab was discontinued after cycle #12 and the patient is currently on the oral acalabrutinib 100 mg p.o. twice daily.  INTERVAL HISTORY: [name] Traub 50 y.o. male returns to the clinic today for follow-up visit.  The patient is feeling fine today with no concerning complaints.  He denied having any recent weight loss or night sweats.  He has no palpable lymphadenopathy.  He has no nausea, vomiting, diarrhea or constipation.  He has no headache or visual changes.  He has no chest pain, shortness of breath, cough or hemoptysis.  The swelling in the lower extremities has significantly improved.  He continues to tolerate his treatment with acalabrutinib fairly well.  The patient is here today for evaluation and repeat blood work.  MEDICAL HISTORY: Past Medical History:  Diagnosis Date  . Asthma   . cll dx'd 04/2019  . COPD (chronic obstructive pulmonary disease) ([place])   . Diabetes mellitus without complication ([place])   . Hypertension   . Seizures ([place])    3-4 years ago. Single episode.  . Tobacco use     ALLERGIES:  is allergic to amlodipine besylate-valsartan and hydrochlorothiazide.  MEDICATIONS:  Current Outpatient Medications  Medication Sig Dispense Refill  . albuterol (PROVENTIL) (2.5 MG/3ML) 0.083% nebulizer solution Take 1 mL by nebulization as needed. Every 4-6 hours  as needed    . amLODipine (NORVASC) 10 MG tablet Take 10 mg by mouth daily.    . ASPIRIN 81 PO Take 81 mg by mouth daily.     Marland Kitchen atorvastatin (LIPITOR) 40 MG tablet Take 40 mg by mouth at bedtime.    Marland Kitchen CALQUENCE 100 MG capsule TAKE 1 CAPSULE (100 MG TOTAL) BY MOUTH 2 (TWO) TIMES DAILY. 60 capsule 2  . carvedilol (COREG) 12.5 MG tablet Take 12.5 mg by mouth 2 (two) times daily.    Marland Kitchen EASY TOUCH PEN NEEDLES 31G X 6 MM MISC Inject 1 application into the skin daily.    Marland Kitchen escitalopram (LEXAPRO) 20 MG tablet Take 20 mg by mouth daily.    . fluticasone furoate-vilanterol (BREO ELLIPTA) 200-25 MCG/INH AEPB Inhale 1 puff into the lungs daily. 28 each 1  . HUMALOG KWIKPEN 100 UNIT/ML KwikPen Inject 15 Units into the skin as directed.    . Insulin Detemir (LEVEMIR) 100 UNIT/ML Pen Inject 30 Units into the skin daily. (Patient taking differently: Inject 46 Units into the skin daily. ) 18 mL 0  . Insulin Pen Needle (PEN NEEDLES 3/16") 31G X 5 MM MISC Use as directed with insulin pen 100 each 0  . ipratropium-albuterol (DUONEB) 0.5-2.5 (3) MG/3ML SOLN Take 3 mLs by nebulization every 6 (six) hours as needed. J44.9 and J44.1 360 mL 0  . lisinopril (ZESTRIL) 40 MG tablet Take 40 mg by mouth daily.    . metFORMIN (GLUCOPHAGE) 1000 MG tablet Take 1,000 mg by mouth 2 (two) times daily.    . metFORMIN (  GLUCOPHAGE-XR) 500 MG 24 hr tablet Take 1,000 mg by mouth 2 (two) times daily.    . metoprolol succinate (TOPROL-XL) 25 MG 24 hr tablet Take 25 mg by mouth daily.    Marland Kitchen NOVOLOG FLEXPEN 100 UNIT/ML FlexPen Inject into the skin as directed.     . pantoprazole (PROTONIX) 40 MG tablet Take 1 tablet (40 mg total) by mouth daily. 30 tablet 11  . VENTOLIN HFA 108 (90 Base) MCG/ACT inhaler Inhale 1-2 puffs into the lungs as directed.    . vitamin B-12 (CYANOCOBALAMIN) 1000 MCG tablet Take 1 tablet (1,000 mcg total) by mouth daily. 90 tablet 0   No current facility-administered medications for this visit.    SURGICAL  HISTORY: No past surgical history on file.  REVIEW OF SYSTEMS:  A comprehensive review of systems was negative.   PHYSICAL EXAMINATION: General appearance: alert, cooperative, fatigued and no distress Head: Normocephalic, without obvious abnormality, atraumatic Neck: no adenopathy, no JVD, supple, symmetrical, trachea midline and thyroid not enlarged, symmetric, no tenderness/mass/nodules Lymph nodes: Cervical, supraclavicular, and axillary nodes normal. Resp: clear to auscultation bilaterally Back: symmetric, no curvature. ROM normal. No CVA tenderness. Cardio: regular rate and rhythm, S1, S2 normal, no murmur, click, rub or gallop GI: soft, non-tender; bowel sounds normal; no masses,  no organomegaly Extremities: extremities normal, atraumatic, no cyanosis or edema  ECOG PERFORMANCE STATUS: 1 - Symptomatic but completely ambulatory  Blood pressure (!) 136/59, pulse 85, temperature 98 F (36.7 C), temperature source Tympanic, resp. rate 16, height 5\' 10"  (1.778 m), weight 213 lb 8 oz (96.8 kg), SpO2 100 %.  LABORATORY DATA: Lab Results  Component Value Date   WBC 19.2 (H) 10/16/2020   HGB 14.0 10/16/2020   HCT 43.7 10/16/2020   MCV 88.3 10/16/2020   PLT 255 10/16/2020      Chemistry      Component Value Date/Time   NA 137 10/16/2020 0809   NA 137 07/06/2012 0506   K 4.6 10/16/2020 0809   K 4.0 07/06/2012 0506   CL 102 10/16/2020 0809   CL 104 07/06/2012 0506   CO2 24 10/16/2020 0809   CO2 25 07/06/2012 0506   BUN 15 10/16/2020 0809   BUN 12 07/06/2012 0506   CREATININE 1.04 10/16/2020 0809   CREATININE 0.62 07/06/2012 0506      Component Value Date/Time   CALCIUM 9.6 10/16/2020 0809   CALCIUM 8.6 07/06/2012 0506   ALKPHOS 65 10/16/2020 0809   ALKPHOS 96 07/06/2012 0506   AST 11 (L) 10/16/2020 0809   ALT 13 10/16/2020 0809   ALT 30 07/06/2012 0506   BILITOT <0.2 (L) 10/16/2020 0809       RADIOGRAPHIC STUDIES: CT Chest W Contrast  Result Date:  10/16/2020 CLINICAL DATA:  Follow-up chronic lymphocytic leukemia diagnosed in September 2020. Chemotherapy ongoing. EXAM: CT CHEST, ABDOMEN, AND PELVIS WITH CONTRAST TECHNIQUE: Multidetector CT imaging of the chest, abdomen and pelvis was performed following the standard protocol during bolus administration of intravenous contrast. CONTRAST:  124mL OMNIPAQUE IOHEXOL 300 MG/ML  SOLN COMPARISON:  CTs 01/21/2020 and 11/01/2019. FINDINGS: CT CHEST FINDINGS Cardiovascular: No acute vascular findings. The pulmonary arteries appear patent. There is mild atherosclerosis of the aorta and coronary arteries. The heart size is normal. There is no pericardial effusion. Mediastinum/Nodes: There are no enlarged mediastinal, hilar or axillary lymph nodes. Previously identified prominent mediastinal, hilar and axillary lymph nodes have decreased in size. For example, a right axillary node which previously measured 1.1 cm now measures 0.8  cm on image 16/2. A left axillary node which previously measured 1.2 cm now measures 0.6 cm on image 18/2. A right hilar node which previously measured 1.1 cm now measures 1.1 cm on image 25/2. No progressive adenopathy. Stable coarse calcifications within the thyroid isthmus. The trachea and esophagus appear unremarkable. Lungs/Pleura: No pleural effusion or pneumothorax. There is stable chronic central airway thickening. There is a new small nodule centrally in the left upper lobe, measuring 5 mm on image 57/7. Additional smaller clustered nodules are present more peripherally in the left upper lobe, some of which appear partially calcified. These are likely inflammatory. Musculoskeletal/Chest wall: No chest wall mass or suspicious osseous findings. CT ABDOMEN AND PELVIS FINDINGS Hepatobiliary: The liver is normal in density without suspicious focal abnormality. No evidence of gallstones, gallbladder wall thickening or biliary dilatation. Pancreas: Unremarkable. No pancreatic ductal dilatation or  surrounding inflammatory changes. Spleen: Normal in size without focal abnormality. Adrenals/Urinary Tract: Both adrenal glands appear normal. The kidneys appear normal without evidence of urinary tract calculus, suspicious lesion or hydronephrosis. No bladder abnormalities are seen. Stomach/Bowel: The stomach appears unremarkable for its degree of distension. No evidence of bowel wall thickening, distention or surrounding inflammatory change. The appendix appears normal. Vascular/Lymphatic: There are no enlarged abdominal or pelvic lymph nodes. Previously noted prominent inguinal lymph nodes demonstrate further decrease in size. There is diffuse aortic and branch vessel atherosclerosis without acute vascular findings. The portal, superior mesenteric and splenic veins are patent. Reproductive: The prostate gland and seminal vesicles appear normal. Other: No evidence of abdominal wall mass or hernia. No ascites. Musculoskeletal: No acute or focal osseous lesions are identified. 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 There are mild degenerative changes in the hips and lower lumbar spine. IMPRESSION: 1. Interval decreased size of previously identified prominent mediastinal, hilar and axillary lymph nodes, consistent with a positive response to therapy. No residual enlarged lymph nodes or progressive adenopathy. 2. New small clustered nodules centrally in the left upper lobe, likely inflammatory. Suggest attention on routine follow-up within 1 year. 3. 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 4. Aortic Atherosclerosis (ICD10-I70.0). Electronically Signed   By: Richardean Sale M.D.   On: 10/16/2020 12:56   CT Abdomen Pelvis W Contrast  Result Date: 10/16/2020 CLINICAL DATA:  Follow-up chronic lymphocytic leukemia diagnosed in September 2020. Chemotherapy ongoing. EXAM: CT CHEST, ABDOMEN, AND PELVIS  WITH CONTRAST TECHNIQUE: Multidetector CT imaging of the chest, abdomen and pelvis was performed following the standard protocol during bolus administration of intravenous contrast. CONTRAST:  175mL OMNIPAQUE IOHEXOL 300 MG/ML  SOLN COMPARISON:  CTs 01/21/2020 and 11/01/2019. FINDINGS: CT CHEST FINDINGS Cardiovascular: No acute vascular findings. The pulmonary arteries appear patent. There is mild atherosclerosis of the aorta and coronary arteries. The heart size is normal. There is no pericardial effusion. Mediastinum/Nodes: There are no enlarged mediastinal, hilar or axillary lymph nodes. Previously identified prominent mediastinal, hilar and axillary lymph nodes have decreased in size. For example, a right axillary node which previously measured 1.1 cm now measures 0.8 cm on image 16/2. A left axillary node which previously measured 1.2 cm now measures 0.6 cm on image 18/2. A right hilar node which previously measured 1.1 cm now measures 1.1 cm on image 25/2. No progressive adenopathy. Stable coarse calcifications within the thyroid isthmus. The trachea and esophagus appear unremarkable. Lungs/Pleura: No pleural effusion or pneumothorax. There is stable chronic central airway thickening. There is a new small  nodule centrally in the left upper lobe, measuring 5 mm on image 57/7. Additional smaller clustered nodules are present more peripherally in the left upper lobe, some of which appear partially calcified. These are likely inflammatory. Musculoskeletal/Chest wall: No chest wall mass or suspicious osseous findings. CT ABDOMEN AND PELVIS FINDINGS Hepatobiliary: The liver is normal in density without suspicious focal abnormality. No evidence of gallstones, gallbladder wall thickening or biliary dilatation. Pancreas: Unremarkable. No pancreatic ductal dilatation or surrounding inflammatory changes. Spleen: Normal in size without focal abnormality. Adrenals/Urinary Tract: Both adrenal glands appear normal. The  kidneys appear normal without evidence of urinary tract calculus, suspicious lesion or hydronephrosis. No bladder abnormalities are seen. Stomach/Bowel: The stomach appears unremarkable for its degree of distension. No evidence of bowel wall thickening, distention or surrounding inflammatory change. The appendix appears normal. Vascular/Lymphatic: There are no enlarged abdominal or pelvic lymph nodes. Previously noted prominent inguinal lymph nodes demonstrate further decrease in size. There is diffuse aortic and branch vessel atherosclerosis without acute vascular findings. The portal, superior mesenteric and splenic veins are patent. Reproductive: The prostate gland and seminal vesicles appear normal. Other: No evidence of abdominal wall mass or hernia. No ascites. Musculoskeletal: No acute or focal osseous lesions are identified. 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 There are mild degenerative changes in the hips and lower lumbar spine. IMPRESSION: 1. Interval decreased size of previously identified prominent mediastinal, hilar and axillary lymph nodes, consistent with a positive response to therapy. No residual enlarged lymph nodes or progressive adenopathy. 2. New small clustered nodules centrally in the left upper lobe, likely inflammatory. Suggest attention on routine follow-up within 1 year. 3. 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 4. Aortic Atherosclerosis (ICD10-I70.0). Electronically Signed   By: Richardean Sale M.D.   On: 10/16/2020 12:56    ASSESSMENT AND PLAN: This is a very pleasant 50 years old white male with CLL/small lymphocytic lymphoma diagnosed in 2010 and has been in observation for several years but the patient had significant disease progression and August 2020. He is currently undergoing treatment with obinutuzumab every 4 weeks in addition to acalabrutinib 100 mg  p.o. twice daily.  Status post 13 cycles. Obinutuzumab was discontinued after cycle #12.He is currently on treatment with acalabrutinib 100 mg p.o. twice daily. The patient has been tolerating this treatment well with no concerning adverse effects. I recommended for him to continue his current treatment with acalabrutinib with the same dose. I will see him back for follow-up visit in 1 months for evaluation and repeat blood work. He was advised to call immediately if he has any concerning symptoms in the interval. The patient voices understanding of current disease status and treatment options and is in agreement with the current care plan.  All questions were answered. The patient knows to call the clinic with any problems, questions or concerns. We can certainly see the patient much sooner if necessary.   Disclaimer: This note was dictated with voice recognition software. Similar sounding words can inadvertently be transcribed and may not be corrected upon review.

## 2020-11-15 ENCOUNTER — Telehealth: Payer: Self-pay | Admitting: Internal Medicine

## 2020-11-15 NOTE — Telephone Encounter (Signed)
Scheduled per 03/21 los, patient has been called and notified. 

## 2020-11-24 ENCOUNTER — Other Ambulatory Visit (HOSPITAL_COMMUNITY): Payer: Self-pay

## 2020-11-30 ENCOUNTER — Other Ambulatory Visit (HOSPITAL_COMMUNITY): Payer: Self-pay

## 2020-11-30 ENCOUNTER — Other Ambulatory Visit: Payer: Self-pay | Admitting: Internal Medicine

## 2020-11-30 DIAGNOSIS — C9110 Chronic lymphocytic leukemia of B-cell type not having achieved remission: Secondary | ICD-10-CM

## 2020-11-30 MED ORDER — CALQUENCE 100 MG PO CAPS
ORAL_CAPSULE | Freq: Two times a day (BID) | ORAL | 2 refills | Status: DC
  Filled 2020-11-30: qty 60, fill #0
  Filled 2020-12-01: qty 60, 30d supply, fill #0
  Filled 2020-12-28: qty 60, 30d supply, fill #1
  Filled 2021-01-30: qty 60, 30d supply, fill #2

## 2020-12-01 ENCOUNTER — Other Ambulatory Visit (HOSPITAL_COMMUNITY): Payer: Self-pay

## 2020-12-05 ENCOUNTER — Other Ambulatory Visit (HOSPITAL_COMMUNITY): Payer: Self-pay

## 2020-12-09 IMAGING — CT NM PET TUM IMG RESTAG (PS) SKULL BASE T - THIGH
4 series · 25 of 25 positions shown · non-contrast
Comparison: CT scan 08/06/2019

CLINICAL DATA: Initial treatment strategy for CLL with rapid
progression of bulky adenopathy worrisome for Gay[place]
transformation. Undergoing treatment.

EXAM:
NUCLEAR MEDICINE PET SKULL BASE TO THIGH
TECHNIQUE: 9.8 mCi F-18 FDG was injected intravenously. Full-ring PET imaging
was performed from the skull base to thigh after the radiotracer. CT
data was obtained and used for attenuation correction and anatomic
localization.
Fasting blood glucose: 197 mg/dl

[Series 3: pet sk_thigh ac · axial · 5.0mm · 4.07mm/px · z∈[-1122,-170]mm · 8 of 239 slices shown]
[im 1/239]
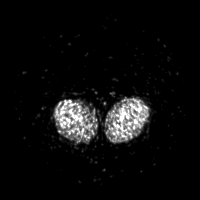
[im 35/239]
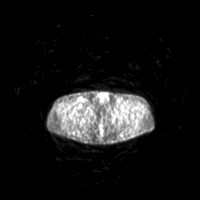
[im 69/239]
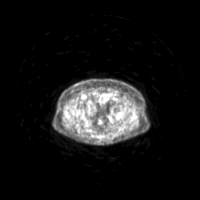
[im 103/239]
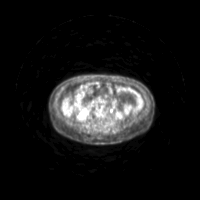
[im 137/239]
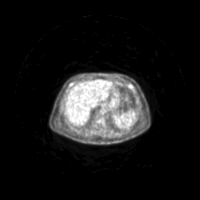
[im 171/239]
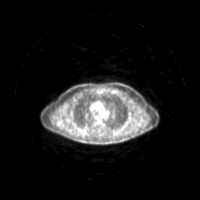
[im 205/239]
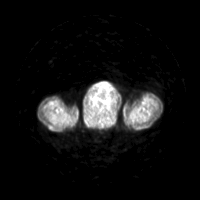
[im 239/239]
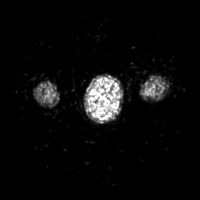

[Series 4: ct sk_thigh 5.0 b31f · axial · 5.0mm · 0.98mm/px · z∈[-1122,-170]mm · 8 of 239 slices shown]
[im 1/239]
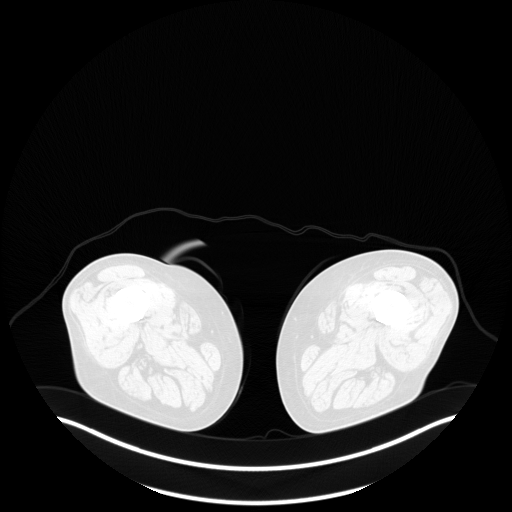
[im 35/239]
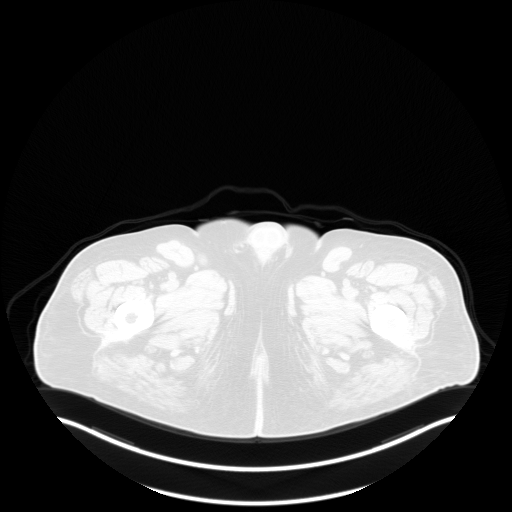
[im 69/239]
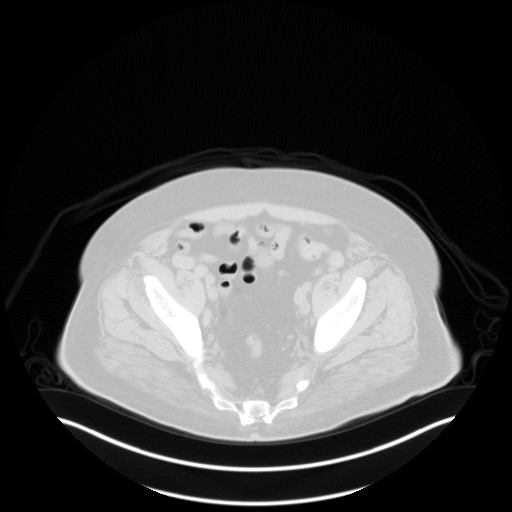
[im 103/239]
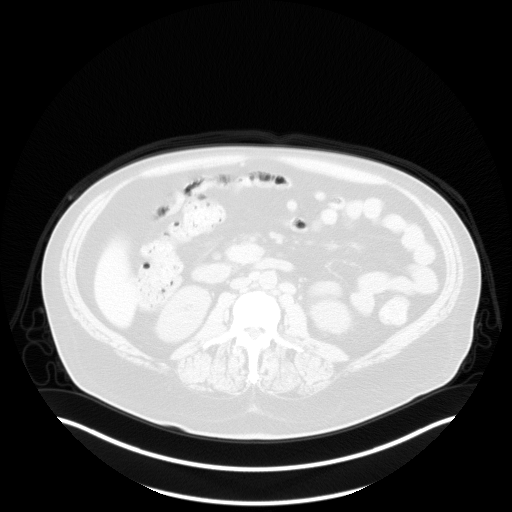
[im 137/239]
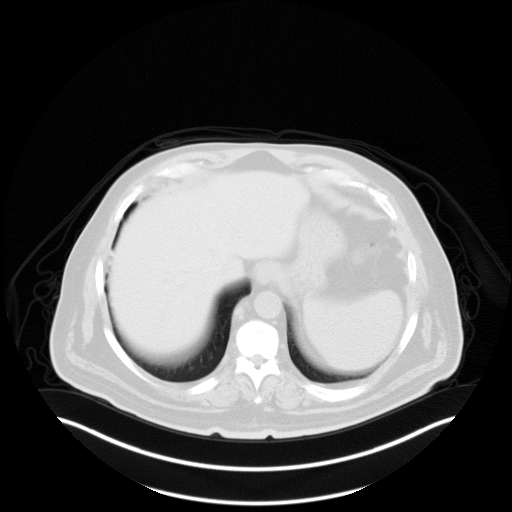
[im 171/239]
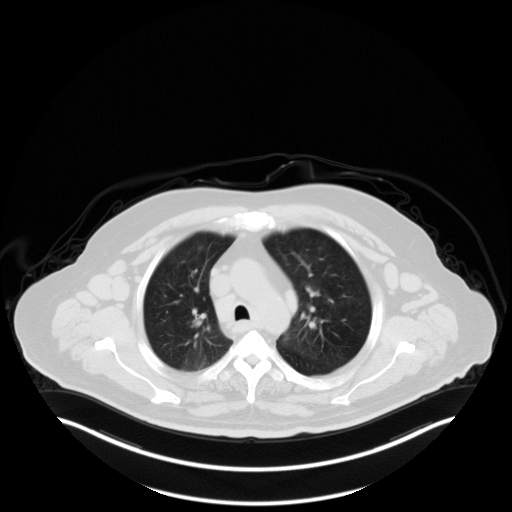
[im 205/239]
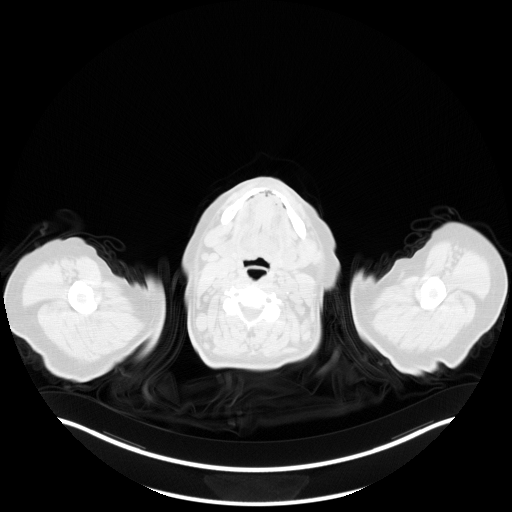
[im 239/239  brain]
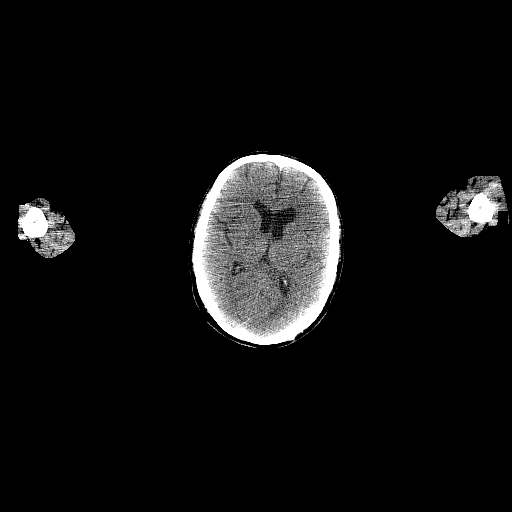

[Series 5: pet sk_thigh nac · axial · 5.0mm · 4.07mm/px · z∈[-1122,-170]mm · 8 of 239 slices shown]
[im 1/239]
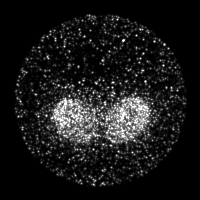
[im 35/239]
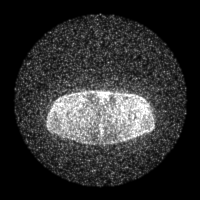
[im 69/239]
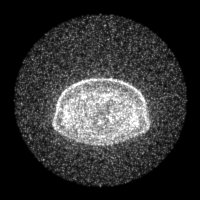
[im 103/239]
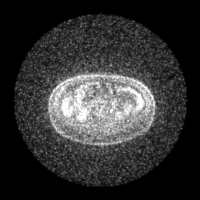
[im 137/239]
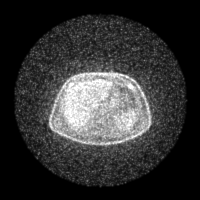
[im 171/239]
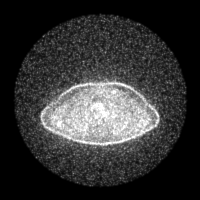
[im 205/239]
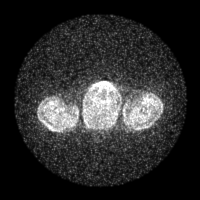
[im 239/239]
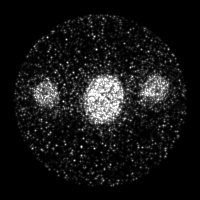

[Series 604: mip range 2 · coronal · 1.98mm/px · 1 of 32 slices shown]
[im 1/32]
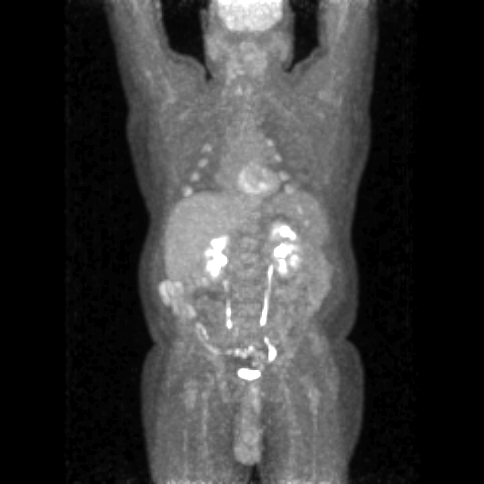

[25 of 25 positions shown; findings below may reference images not displayed]

FINDINGS: Mediastinal blood pool activity: SUV max

Liver activity: SUV max

NECK: Small multi station neck adenopathy but no hypermetabolism.

Incidental CT findings: none

CHEST: Supraclavicular, axillary and subpectoral adenopathy,
slightly decreased in size since the recent CT examinations. Left
axillary node on image 61/4 measures 18.5 mm and previously measured
27 mm.

25 mm right axillary node on image 71/4 previously measured 35 mm.

Right paratracheal lymph node on image 69/4 measures 15 mm and
previously measured 22.5 mm.

10 mm prevascular lymph node on image 70/for previously measured 18
mm.

No hypermetabolism is demonstrated.

No worrisome pulmonary lesions or pleural effusion. The pleural
effusion seen on the prior CT have resolved.

Incidental CT findings: none

ABDOMEN/PELVIS: Gastrohepatic ligament, celiac axis, mesenteric and
retroperitoneal adenopathy all demonstrating interval decrease in
size. No hypermetabolism.

Bulky pelvic lymphadenopathy and inguinal lymphadenopathy also
decreased in size since the prior study. No hypermetabolism.

21 mm right obturator lymph node on image 180/for previously
measured 33 mm.

21.5 mm right axillary node on image 196/4 previously measured 31
mm.

Incidental CT findings: none

SKELETON: No significant osseous findings. Uptake noted at the
costochondral junction of the ribs, likely inflammatory.

Incidental CT findings: none
IMPRESSION: 1. Interval decrease in size of the diffuse adenopathy in the neck,
chest, abdomen and pelvis when compared to the recent prior CT scans
from 08/05/2019 and 08/06/2019. This would suggest a good, early
response to treatment.
2. No hypermetabolism is demonstrated ([HOSPITAL] 1).

3. Resolution of pleural effusions.

## 2020-12-19 ENCOUNTER — Other Ambulatory Visit: Payer: Self-pay

## 2020-12-19 ENCOUNTER — Inpatient Hospital Stay: Payer: Medicare Other | Attending: Internal Medicine

## 2020-12-19 ENCOUNTER — Inpatient Hospital Stay (HOSPITAL_BASED_OUTPATIENT_CLINIC_OR_DEPARTMENT_OTHER): Payer: Medicare Other | Admitting: Internal Medicine

## 2020-12-19 VITALS — BP 120/76 | HR 108 | Temp 97.7°F | Resp 20 | Ht 70.0 in | Wt 216.9 lb

## 2020-12-19 DIAGNOSIS — Z5111 Encounter for antineoplastic chemotherapy: Secondary | ICD-10-CM | POA: Diagnosis not present

## 2020-12-19 DIAGNOSIS — C9110 Chronic lymphocytic leukemia of B-cell type not having achieved remission: Secondary | ICD-10-CM

## 2020-12-19 DIAGNOSIS — I10 Essential (primary) hypertension: Secondary | ICD-10-CM | POA: Insufficient documentation

## 2020-12-19 DIAGNOSIS — Z79899 Other long term (current) drug therapy: Secondary | ICD-10-CM | POA: Insufficient documentation

## 2020-12-19 DIAGNOSIS — R079 Chest pain, unspecified: Secondary | ICD-10-CM | POA: Insufficient documentation

## 2020-12-19 DIAGNOSIS — R0602 Shortness of breath: Secondary | ICD-10-CM | POA: Insufficient documentation

## 2020-12-19 LAB — CMP (CANCER CENTER ONLY)
ALT: 16 U/L (ref 0–44)
AST: 14 U/L — ABNORMAL LOW (ref 15–41)
Albumin: 4.1 g/dL (ref 3.5–5.0)
Alkaline Phosphatase: 79 U/L (ref 38–126)
Anion gap: 9 (ref 5–15)
BUN: 25 mg/dL — ABNORMAL HIGH (ref 6–20)
CO2: 24 mmol/L (ref 22–32)
Calcium: 9.7 mg/dL (ref 8.9–10.3)
Chloride: 102 mmol/L (ref 98–111)
Creatinine: 1.61 mg/dL — ABNORMAL HIGH (ref 0.61–1.24)
GFR, Estimated: 51 mL/min — ABNORMAL LOW (ref >60)
Glucose, Bld: 221 mg/dL — ABNORMAL HIGH (ref 70–99)
Potassium: 5.8 mmol/L — ABNORMAL HIGH (ref 3.5–5.1)
Sodium: 135 mmol/L (ref 135–145)
Total Bilirubin: 0.3 mg/dL (ref 0.3–1.2)
Total Protein: 7 g/dL (ref 6.5–8.1)

## 2020-12-19 LAB — CBC WITH DIFFERENTIAL (CANCER CENTER ONLY)
Abs Immature Granulocytes: 0.08 10*3/uL — ABNORMAL HIGH (ref 0.00–0.07)
Basophils Absolute: 0.1 10*3/uL (ref 0.0–0.1)
Basophils Relative: 0 %
Eosinophils Absolute: 0.3 10*3/uL (ref 0.0–0.5)
Eosinophils Relative: 2 %
HCT: 43.4 % (ref 39.0–52.0)
Hemoglobin: 14.1 g/dL (ref 13.0–17.0)
Immature Granulocytes: 1 %
Lymphocytes Relative: 13 %
Lymphs Abs: 2.1 10*3/uL (ref 0.7–4.0)
MCH: 28.5 pg (ref 26.0–34.0)
MCHC: 32.5 g/dL (ref 30.0–36.0)
MCV: 87.9 fL (ref 80.0–100.0)
Monocytes Absolute: 1.2 10*3/uL — ABNORMAL HIGH (ref 0.1–1.0)
Monocytes Relative: 8 %
Neutro Abs: 12.2 10*3/uL — ABNORMAL HIGH (ref 1.7–7.7)
Neutrophils Relative %: 76 %
Platelet Count: 235 10*3/uL (ref 150–400)
RBC: 4.94 MIL/uL (ref 4.22–5.81)
RDW: 14.9 % (ref 11.5–15.5)
WBC Count: 15.9 10*3/uL — ABNORMAL HIGH (ref 4.0–10.5)
nRBC: 0 % (ref 0.0–0.2)

## 2020-12-19 LAB — LACTATE DEHYDROGENASE: LDH: 119 U/L (ref 98–192)

## 2020-12-19 LAB — URIC ACID: Uric Acid, Serum: 7.3 mg/dL (ref 3.7–8.6)

## 2020-12-19 NOTE — Progress Notes (Signed)
[place] [phone]   [phone]  OFFICE PROGRESS NOTE  [name], MD [place] [address]  DIAGNOSIS: CLL/small lymphocytic lymphoma diagnosed in 2010 and has been in observation for several years with no treatment. 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 The cytogenetics were negative for P 17.   PRIOR THERAPY: None  CURRENT THERAPY: Obinutuzumab 1,000 mg IV. First dose on 08/10/2019. He will also begin acalabrutinib 100 mg BID starting on 08/19/2019.  Status post 13 cycles.  Obinutuzumab was discontinued after cycle #12 and the patient is currently on the oral acalabrutinib 100 mg p.o. twice daily.  INTERVAL HISTORY: Larry Lam 50 y.o. male returns to the clinic today for follow-up visit.  The patient is feeling fine today with no concerning complaints.  He continues to tolerate his treatment with acalabrutinib fairly well.  He denied having any nausea, vomiting, diarrhea or constipation.  He denied having any fever or chills.  Chest pain, shortness of breath, cough or hemoptysis.  He denied having any fever or chills.  He has no weight loss or night sweats.  He is here today for evaluation and repeat blood work.  MEDICAL HISTORY: Past Medical History:  Diagnosis Date  . Asthma   . cll dx'd 04/2019  . COPD (chronic obstructive pulmonary disease) ([facility])   . Diabetes mellitus without complication ([place])   . Hypertension   . Seizures ([place])    3-4 years ago. Single episode.  . Tobacco use     ALLERGIES:  is allergic to amlodipine besylate-valsartan and hydrochlorothiazide.  MEDICATIONS:  Current Outpatient Medications  Medication Sig Dispense Refill  . acalabrutinib (CALQUENCE) 100 MG capsule TAKE 1 CAPSULE (100 MG TOTAL) BY MOUTH 2 (TWO) TIMES DAILY. 60 capsule 2  . albuterol (PROVENTIL) (2.5 MG/3ML) 0.083% nebulizer solution Take 1 mL by nebulization  as needed. Every 4-6 hours as needed    . amLODipine (NORVASC) 10 MG tablet Take 10 mg by mouth daily.    . ASPIRIN 81 PO Take 81 mg by mouth daily.     Marland Kitchen atorvastatin (LIPITOR) 40 MG tablet Take 40 mg by mouth at bedtime.    . carvedilol (COREG) 12.5 MG tablet Take 12.5 mg by mouth 2 (two) times daily.    Marland Kitchen EASY TOUCH PEN NEEDLES 31G X 6 MM MISC Inject 1 application into the skin daily.    Marland Kitchen escitalopram (LEXAPRO) 20 MG tablet Take 20 mg by mouth daily.    . fluticasone furoate-vilanterol (BREO ELLIPTA) 200-25 MCG/INH AEPB Inhale 1 puff into the lungs daily. (Patient not taking: Reported on 11/14/2020) 28 each 1  . HUMALOG KWIKPEN 100 UNIT/ML KwikPen Inject 15 Units into the skin as directed.    . Insulin Detemir (LEVEMIR) 100 UNIT/ML Pen Inject 30 Units into the skin daily. (Patient taking differently: Inject 46 Units into the skin daily. ) 18 mL 0  . Insulin Pen Needle (PEN NEEDLES 3/16") 31G X 5 MM MISC Use as directed with insulin pen 100 each 0  . ipratropium-albuterol (DUONEB) 0.5-2.5 (3) MG/3ML SOLN Take 3 mLs by nebulization every 6 (six) hours as needed. J44.9 and J44.1 360 mL 0  . lisinopril (ZESTRIL) 40 MG tablet Take 40 mg by mouth daily.    . metFORMIN (GLUCOPHAGE) 1000 MG tablet Take 1,000 mg by mouth 2 (two) times daily.    . metoprolol succinate (TOPROL-XL) 25 MG 24 hr  tablet Take 25 mg by mouth daily.    Marland Kitchen NOVOLOG FLEXPEN 100 UNIT/ML FlexPen Inject into the skin as directed.     . pantoprazole (PROTONIX) 40 MG tablet Take 1 tablet (40 mg total) by mouth daily. 30 tablet 11  . VENTOLIN HFA 108 (90 Base) MCG/ACT inhaler Inhale 1-2 puffs into the lungs as directed.    . vitamin B-12 (CYANOCOBALAMIN) 1000 MCG tablet Take 1 tablet (1,000 mcg total) by mouth daily. 90 tablet 0   No current facility-administered medications for this visit.    SURGICAL HISTORY: No past surgical history on file.  REVIEW OF SYSTEMS:  A comprehensive review of systems was negative.   PHYSICAL  EXAMINATION: General appearance: alert, cooperative and no distress Head: Normocephalic, without obvious abnormality, atraumatic Neck: no adenopathy, no JVD, supple, symmetrical, trachea midline and thyroid not enlarged, symmetric, no tenderness/mass/nodules Lymph nodes: Cervical, supraclavicular, and axillary nodes normal. Resp: clear to auscultation bilaterally Back: symmetric, no curvature. ROM normal. No CVA tenderness. Cardio: regular rate and rhythm, S1, S2 normal, no murmur, click, rub or gallop GI: soft, non-tender; bowel sounds normal; no masses,  no organomegaly Extremities: extremities normal, atraumatic, no cyanosis or edema  ECOG PERFORMANCE STATUS: 1 - Symptomatic but completely ambulatory  Blood pressure 120/76, pulse (!) 108, temperature 97.7 F (36.5 C), temperature source Tympanic, resp. rate 20, height 5\' 10"  (1.778 m), weight 216 lb 14.4 oz (98.4 kg), SpO2 95 %.  LABORATORY DATA: Lab Results  Component Value Date   WBC 15.9 (H) 12/19/2020   HGB 14.1 12/19/2020   HCT 43.4 12/19/2020   MCV 87.9 12/19/2020   PLT 235 12/19/2020      Chemistry      Component Value Date/Time   NA 137 11/14/2020 0916   NA 137 07/06/2012 0506   K 5.8 (H) 11/14/2020 0916   K 4.0 07/06/2012 0506   CL 103 11/14/2020 0916   CL 104 07/06/2012 0506   CO2 24 11/14/2020 0916   CO2 25 07/06/2012 0506   BUN 24 (H) 11/14/2020 0916   BUN 12 07/06/2012 0506   CREATININE 1.53 (H) 11/14/2020 0916   CREATININE 0.62 07/06/2012 0506      Component Value Date/Time   CALCIUM 9.6 11/14/2020 0916   CALCIUM 8.6 07/06/2012 0506   ALKPHOS 74 11/14/2020 0916   ALKPHOS 96 07/06/2012 0506   AST 14 (L) 11/14/2020 0916   ALT 29 11/14/2020 0916   ALT 30 07/06/2012 0506   BILITOT 0.3 11/14/2020 0916       RADIOGRAPHIC STUDIES: No results found.  ASSESSMENT AND PLAN: This is a very pleasant 50 years old white male with CLL/small lymphocytic lymphoma diagnosed in 2010 and has been in observation  for several years but the patient had significant disease progression and August 2020. He is currently undergoing treatment with obinutuzumab every 4 weeks in addition to acalabrutinib 100 mg p.o. twice daily.  Status post 14 cycles. Obinutuzumab was discontinued after cycle #12.He is currently on treatment with acalabrutinib 100 mg p.o. twice daily. The patient has been tolerating this treatment well with no concerning adverse effects. I recommended for him to continue his current treatment with acalabrutinib with the same dose. The patient will come back for follow-up visit in 1 months for evaluation and repeat blood work. The patient voices understanding of current disease status and treatment options and is in agreement with the current care plan.  All questions were answered. The patient knows to call the clinic with any problems,  questions or concerns. We can certainly see the patient much sooner if necessary.   Disclaimer: This note was dictated with voice recognition software. Similar sounding words can inadvertently be transcribed and may not be corrected upon review.

## 2020-12-21 ENCOUNTER — Telehealth: Payer: Self-pay | Admitting: Internal Medicine

## 2020-12-21 NOTE — Telephone Encounter (Signed)
Scheduled per los. Called and left msg. Mailed printout  °

## 2020-12-28 ENCOUNTER — Other Ambulatory Visit (HOSPITAL_COMMUNITY): Payer: Self-pay

## 2021-01-04 ENCOUNTER — Other Ambulatory Visit (HOSPITAL_COMMUNITY): Payer: Self-pay

## 2021-01-16 ENCOUNTER — Inpatient Hospital Stay: Payer: Medicare Other | Attending: Internal Medicine | Admitting: Internal Medicine

## 2021-01-16 ENCOUNTER — Encounter (HOSPITAL_COMMUNITY): Payer: Self-pay

## 2021-01-16 ENCOUNTER — Other Ambulatory Visit: Payer: Self-pay | Admitting: Medical Oncology

## 2021-01-16 ENCOUNTER — Inpatient Hospital Stay: Payer: Medicare Other

## 2021-01-16 ENCOUNTER — Encounter: Payer: Self-pay | Admitting: Internal Medicine

## 2021-01-16 ENCOUNTER — Emergency Department (HOSPITAL_COMMUNITY)
Admission: EM | Admit: 2021-01-16 | Discharge: 2021-01-16 | Discharge status: Against Medical Advice | Payer: Medicare Other | Source: Home / Self Care | Attending: Emergency Medicine | Admitting: Emergency Medicine

## 2021-01-16 ENCOUNTER — Other Ambulatory Visit: Payer: Self-pay

## 2021-01-16 VITALS — BP 113/70 | HR 94 | Temp 98.0°F | Resp 20 | Ht 70.0 in | Wt 214.2 lb

## 2021-01-16 DIAGNOSIS — I10 Essential (primary) hypertension: Secondary | ICD-10-CM | POA: Insufficient documentation

## 2021-01-16 DIAGNOSIS — R531 Weakness: Secondary | ICD-10-CM | POA: Diagnosis not present

## 2021-01-16 DIAGNOSIS — Z87891 Personal history of nicotine dependence: Secondary | ICD-10-CM | POA: Insufficient documentation

## 2021-01-16 DIAGNOSIS — Z794 Long term (current) use of insulin: Secondary | ICD-10-CM | POA: Insufficient documentation

## 2021-01-16 DIAGNOSIS — E876 Hypokalemia: Secondary | ICD-10-CM | POA: Insufficient documentation

## 2021-01-16 DIAGNOSIS — Z5321 Procedure and treatment not carried out due to patient leaving prior to being seen by health care provider: Secondary | ICD-10-CM | POA: Insufficient documentation

## 2021-01-16 DIAGNOSIS — Z7984 Long term (current) use of oral hypoglycemic drugs: Secondary | ICD-10-CM | POA: Insufficient documentation

## 2021-01-16 DIAGNOSIS — Z7982 Long term (current) use of aspirin: Secondary | ICD-10-CM | POA: Insufficient documentation

## 2021-01-16 DIAGNOSIS — J449 Chronic obstructive pulmonary disease, unspecified: Secondary | ICD-10-CM | POA: Insufficient documentation

## 2021-01-16 DIAGNOSIS — E875 Hyperkalemia: Secondary | ICD-10-CM

## 2021-01-16 DIAGNOSIS — Z5111 Encounter for antineoplastic chemotherapy: Secondary | ICD-10-CM

## 2021-01-16 DIAGNOSIS — J45909 Unspecified asthma, uncomplicated: Secondary | ICD-10-CM | POA: Insufficient documentation

## 2021-01-16 DIAGNOSIS — E119 Type 2 diabetes mellitus without complications: Secondary | ICD-10-CM | POA: Diagnosis not present

## 2021-01-16 DIAGNOSIS — C9110 Chronic lymphocytic leukemia of B-cell type not having achieved remission: Secondary | ICD-10-CM

## 2021-01-16 DIAGNOSIS — Z7951 Long term (current) use of inhaled steroids: Secondary | ICD-10-CM | POA: Diagnosis not present

## 2021-01-16 DIAGNOSIS — Z79899 Other long term (current) drug therapy: Secondary | ICD-10-CM | POA: Insufficient documentation

## 2021-01-16 DIAGNOSIS — Z7952 Long term (current) use of systemic steroids: Secondary | ICD-10-CM | POA: Insufficient documentation

## 2021-01-16 LAB — COMPREHENSIVE METABOLIC PANEL
ALT: 15 U/L (ref 0–44)
AST: 16 U/L (ref 15–41)
Albumin: 4.3 g/dL (ref 3.5–5.0)
Alkaline Phosphatase: 70 U/L (ref 38–126)
Anion gap: 9 (ref 5–15)
BUN: 24 mg/dL — ABNORMAL HIGH (ref 6–20)
CO2: 24 mmol/L (ref 22–32)
Calcium: 9.5 mg/dL (ref 8.9–10.3)
Chloride: 101 mmol/L (ref 98–111)
Creatinine, Ser: 1.4 mg/dL — ABNORMAL HIGH (ref 0.61–1.24)
GFR, Estimated: >60 mL/min (ref >60)
Glucose, Bld: 90 mg/dL (ref 70–99)
Potassium: 6.7 mmol/L (ref 3.5–5.1)
Sodium: 134 mmol/L — ABNORMAL LOW (ref 135–145)
Total Bilirubin: 0.4 mg/dL (ref 0.3–1.2)
Total Protein: 7.4 g/dL (ref 6.5–8.1)

## 2021-01-16 LAB — CBC WITH DIFFERENTIAL (CANCER CENTER ONLY)
Abs Immature Granulocytes: 0.1 10*3/uL — ABNORMAL HIGH (ref 0.00–0.07)
Basophils Absolute: 0.1 10*3/uL (ref 0.0–0.1)
Basophils Relative: 0 %
Eosinophils Absolute: 0.3 10*3/uL (ref 0.0–0.5)
Eosinophils Relative: 2 %
HCT: 39.8 % (ref 39.0–52.0)
Hemoglobin: 12.5 g/dL — ABNORMAL LOW (ref 13.0–17.0)
Immature Granulocytes: 1 %
Lymphocytes Relative: 17 %
Lymphs Abs: 3 10*3/uL (ref 0.7–4.0)
MCH: 28.4 pg (ref 26.0–34.0)
MCHC: 31.4 g/dL (ref 30.0–36.0)
MCV: 90.5 fL (ref 80.0–100.0)
Monocytes Absolute: 1 10*3/uL (ref 0.1–1.0)
Monocytes Relative: 6 %
Neutro Abs: 13.5 10*3/uL — ABNORMAL HIGH (ref 1.7–7.7)
Neutrophils Relative %: 74 %
Platelet Count: 275 10*3/uL (ref 150–400)
RBC: 4.4 MIL/uL (ref 4.22–5.81)
RDW: 14.9 % (ref 11.5–15.5)
WBC Count: 17.9 10*3/uL — ABNORMAL HIGH (ref 4.0–10.5)
nRBC: 0 % (ref 0.0–0.2)

## 2021-01-16 LAB — CBC WITH DIFFERENTIAL/PLATELET
Abs Immature Granulocytes: 0.15 10*3/uL — ABNORMAL HIGH (ref 0.00–0.07)
Basophils Absolute: 0.1 10*3/uL (ref 0.0–0.1)
Basophils Relative: 1 %
Eosinophils Absolute: 0.2 10*3/uL (ref 0.0–0.5)
Eosinophils Relative: 1 %
HCT: 39.7 % (ref 39.0–52.0)
Hemoglobin: 12.6 g/dL — ABNORMAL LOW (ref 13.0–17.0)
Immature Granulocytes: 1 %
Lymphocytes Relative: 17 %
Lymphs Abs: 2.8 10*3/uL (ref 0.7–4.0)
MCH: 28.5 pg (ref 26.0–34.0)
MCHC: 31.7 g/dL (ref 30.0–36.0)
MCV: 89.8 fL (ref 80.0–100.0)
Monocytes Absolute: 1.1 10*3/uL — ABNORMAL HIGH (ref 0.1–1.0)
Monocytes Relative: 7 %
Neutro Abs: 12.1 10*3/uL — ABNORMAL HIGH (ref 1.7–7.7)
Neutrophils Relative %: 73 %
Platelets: 279 10*3/uL (ref 150–400)
RBC: 4.42 MIL/uL (ref 4.22–5.81)
RDW: 15 % (ref 11.5–15.5)
WBC: 16.4 10*3/uL — ABNORMAL HIGH (ref 4.0–10.5)
nRBC: 0 % (ref 0.0–0.2)

## 2021-01-16 LAB — CMP (CANCER CENTER ONLY)
ALT: 11 U/L (ref 0–44)
AST: 11 U/L — ABNORMAL LOW (ref 15–41)
Albumin: 3.8 g/dL (ref 3.5–5.0)
Alkaline Phosphatase: 75 U/L (ref 38–126)
Anion gap: 7 (ref 5–15)
BUN: 24 mg/dL — ABNORMAL HIGH (ref 6–20)
CO2: 24 mmol/L (ref 22–32)
Calcium: 9.6 mg/dL (ref 8.9–10.3)
Chloride: 102 mmol/L (ref 98–111)
Creatinine: 1.53 mg/dL — ABNORMAL HIGH (ref 0.61–1.24)
GFR, Estimated: 55 mL/min — ABNORMAL LOW (ref >60)
Glucose, Bld: 126 mg/dL — ABNORMAL HIGH (ref 70–99)
Potassium: 7.1 mmol/L (ref 3.5–5.1)
Sodium: 133 mmol/L — ABNORMAL LOW (ref 135–145)
Total Bilirubin: 0.3 mg/dL (ref 0.3–1.2)
Total Protein: 6.8 g/dL (ref 6.5–8.1)

## 2021-01-16 LAB — URIC ACID: Uric Acid, Serum: 7.1 mg/dL (ref 3.7–8.6)

## 2021-01-16 LAB — POTASSIUM: Potassium: 6.9 mmol/L (ref 3.5–5.1)

## 2021-01-16 LAB — LACTATE DEHYDROGENASE: LDH: 103 U/L (ref 98–192)

## 2021-01-16 MED ORDER — SODIUM BICARBONATE 8.4 % IV SOLN
50.0000 meq | Freq: Once | INTRAVENOUS | Status: AC
  Administered 2021-01-16: 50 meq via INTRAVENOUS
  Filled 2021-01-16: qty 50

## 2021-01-16 MED ORDER — INSULIN ASPART 100 UNIT/ML IV SOLN
10.0000 [IU] | Freq: Once | INTRAVENOUS | Status: AC
  Administered 2021-01-16: 10 [IU] via INTRAVENOUS
  Filled 2021-01-16: qty 0.1

## 2021-01-16 MED ORDER — DEXTROSE 50 % IV SOLN
1.0000 | Freq: Once | INTRAVENOUS | Status: AC
  Administered 2021-01-16: 50 mL via INTRAVENOUS
  Filled 2021-01-16: qty 50

## 2021-01-16 MED ORDER — SODIUM ZIRCONIUM CYCLOSILICATE 10 G PO PACK
10.0000 g | PACK | Freq: Once | ORAL | Status: AC
  Administered 2021-01-16: 10 g via ORAL
  Filled 2021-01-16: qty 1

## 2021-01-16 NOTE — ED Provider Notes (Signed)
[facility] DEPT Provider Note   CSN: [id number] Arrival date & time: 01/16/21  1624     History Chief Complaint  Patient presents with  . Abnormal Lab    Larry Lam is a 50 y.o. male.  Patient was sent over to the emergency department because at the doctor's office his potassium was elevated.  Patient states he feels okay  The history is provided by the patient and medical records. No language interpreter was used.  Weakness Severity:  Mild Onset quality:  Sudden Timing:  Intermittent Chronicity:  New Relieved by:  Nothing Associated symptoms: no abdominal pain, no chest pain, no cough, no diarrhea, no frequency, no headaches and no seizures        Past Medical History:  Diagnosis Date  . Asthma   . cll dx'd 04/2019  . COPD (chronic obstructive pulmonary disease) ([place])   . Diabetes mellitus without complication ([place])   . Hypertension   . Seizures ([place])    3-4 years ago. Single episode.  . Tobacco use     Patient Active Problem List   Diagnosis Date Noted  . Hyperkalemia 01/16/2021  . Elevated serum creatinine 03/23/2020  . LLL pneumonia 03/23/2020  . Pneumonia 03/23/2020  . Encounter for antineoplastic chemotherapy 12/01/2019  . Idiopathic hypotension   . Anemia due to vitamin B12 deficiency   . Thrombocytopenia ([place])   . Multifocal pneumonia 08/05/2019  . Lymphadenopathy 05/06/2019  . Splenomegaly 05/06/2019  . Lung nodules 05/06/2019  . CLL (chronic lymphocytic leukemia) ([place]) 04/21/2019  . Cervical lymphadenopathy 04/21/2019  . Shortness of breath 04/21/2019  . Goals of care, counseling/discussion 04/21/2019  . Chest pain   . Hypophosphatemia 04/28/2017  . Uncontrolled type 2 diabetes mellitus with hyperglycemia, without long-term current use of insulin ([place]) 04/28/2017  . COPD exacerbation ([place]) 04/27/2017  . Tobacco use 04/27/2017  . Seizures ([place]) 01/19/2015  . Hypertension 01/19/2015  . Hypokalemia  01/19/2015  . Leucocytosis 01/19/2015  . DM (diabetes mellitus) ([place]) 01/19/2015  . Chronic obstructive pulmonary disease ([place]) 01/19/2015    History reviewed. No pertinent surgical history.     Family History  Problem Relation Age of Onset  . Lung cancer Maternal Grandfather   . Lung cancer Father     Social History   Tobacco Use  . Smoking status: Former Smoker    Packs/day: 1.00    Years: 33.00    Pack years: 33.00    Types: Cigarettes    Quit date: 03/23/2019    Years since quitting: 1.8  . Smokeless tobacco: Former Network engineer  . Vaping Use: Never used  Substance Use Topics  . Alcohol use: Yes    Alcohol/week: 0.0 standard drinks    Comment: rare  . Drug use: No    Home Medications Prior to Admission medications   Medication Sig Start Date End Date Taking? Authorizing Provider  acalabrutinib (CALQUENCE) 100 MG capsule TAKE 1 CAPSULE (100 MG TOTAL) BY MOUTH 2 (TWO) TIMES DAILY. 11/30/20 11/30/21  Curt Bears, MD  albuterol (PROVENTIL) (2.5 MG/3ML) 0.083% nebulizer solution Take 1 mL by nebulization as needed. Every 4-6 hours as needed 05/17/20   [provider]  amLODipine (NORVASC) 10 MG tablet Take 10 mg by mouth daily. 07/27/20   [provider]  ASPIRIN 81 PO Take 81 mg by mouth daily.     [provider]  atorvastatin (LIPITOR) 40 MG tablet Take 40 mg by mouth at bedtime. 04/24/20   [provider]  carvedilol (COREG) 12.5 MG tablet Take 12.5 mg by mouth 2 (two) times daily. 07/27/20   [provider]  EASY TOUCH PEN NEEDLES 31G X 6 MM MISC Inject 1 application into the skin daily. 05/22/20   [provider]  escitalopram (LEXAPRO) 20 MG tablet Take 20 mg by mouth daily. 05/11/19   [provider]  fluticasone furoate-vilanterol (BREO ELLIPTA) 200-25 MCG/INH AEPB Inhale 1 puff into the lungs daily. 03/27/20   Hosie Poisson, MD  HUMALOG KWIKPEN 100 UNIT/ML KwikPen Inject 15 Units into the skin as  directed. 04/13/20   [provider]  Insulin Detemir (LEVEMIR) 100 UNIT/ML Pen Inject 30 Units into the skin daily. Patient taking differently: Inject 46 Units into the skin daily.  08/13/19 03/23/20  [place] Donnamarie Poag, DO  Insulin Pen Needle (PEN NEEDLES 3/16") 31G X 5 MM MISC Use as directed with insulin pen 08/13/19   [place] Eric J, DO  ipratropium-albuterol (DUONEB) 0.5-2.5 (3) MG/3ML SOLN Take 3 mLs by nebulization every 6 (six) hours as needed. J44.9 and J44.1 04/29/17   Tat, Shanon Brow, MD  lisinopril (ZESTRIL) 40 MG tablet Take 40 mg by mouth daily. 05/06/20   [provider]  metFORMIN (GLUCOPHAGE) 1000 MG tablet Take 1,000 mg by mouth 2 (two) times daily. 08/22/20   [provider]  metoprolol succinate (TOPROL-XL) 25 MG 24 hr tablet Take 25 mg by mouth daily. 09/19/20   [provider]  NOVOLOG FLEXPEN 100 UNIT/ML FlexPen Inject into the skin as directed.  05/18/20   [provider]  pantoprazole (PROTONIX) 40 MG tablet Take 1 tablet (40 mg total) by mouth daily. 08/13/19 08/12/20  [place] Eric J, DO  VENTOLIN HFA 108 (90 Base) MCG/ACT inhaler Inhale 1-2 puffs into the lungs as directed. 05/17/20   [provider]  vitamin B-12 (CYANOCOBALAMIN) 1000 MCG tablet Take 1 tablet (1,000 mcg total) by mouth daily. 08/02/19   Earlie Server, MD    Allergies    Amlodipine besylate-valsartan and Hydrochlorothiazide  Review of Systems   Review of Systems  Constitutional: Negative for appetite change and fatigue.  HENT: Negative for congestion, ear discharge and sinus pressure.   Eyes: Negative for discharge.  Respiratory: Negative for cough.   Cardiovascular: Negative for chest pain.  Gastrointestinal: Negative for abdominal pain and diarrhea.  Genitourinary: Negative for frequency and hematuria.  Musculoskeletal: Negative for back pain.  Skin: Negative for rash.  Neurological: Positive for weakness. Negative for seizures and headaches.   Psychiatric/Behavioral: Negative for hallucinations.    Physical Exam Updated Vital Signs BP 109/61   Pulse 77   Temp 97.6 F (36.4 C) (Oral)   Resp (!) 24   Ht 5\' 10"  (1.778 m)   Wt 97.2 kg   SpO2 97%   BMI 30.73 kg/m   Physical Exam Vitals and nursing note reviewed.  Constitutional:      Appearance: He is well-developed.  HENT:     Head: Normocephalic.     Nose: Nose normal.  Eyes:     General: No scleral icterus.    Conjunctiva/sclera: Conjunctivae normal.  Neck:     Thyroid: No thyromegaly.  Cardiovascular:     Rate and Rhythm: Normal rate and regular rhythm.     Heart sounds: No murmur heard. No friction rub. No gallop.   Pulmonary:     Breath sounds: No stridor. No wheezing or rales.  Chest:     Chest wall: No tenderness.  Abdominal:     General:  There is no distension.     Tenderness: There is no abdominal tenderness. There is no rebound.  Musculoskeletal:        General: Normal range of motion.     Cervical back: Neck supple.  Lymphadenopathy:     Cervical: No cervical adenopathy.  Skin:    Findings: No erythema or rash.  Neurological:     Mental Status: He is alert and oriented to person, place, and time.     Motor: No abnormal muscle tone.     Coordination: Coordination normal.  Psychiatric:        Behavior: Behavior normal.     ED Results / Procedures / Treatments   Labs (all labs ordered are listed, but only abnormal results are displayed) Labs Reviewed  CBC WITH DIFFERENTIAL/PLATELET - Abnormal; Notable for the following components:      Result Value   WBC 16.4 (*)    Hemoglobin 12.6 (*)    Neutro Abs 12.1 (*)    Monocytes Absolute 1.1 (*)    Abs Immature Granulocytes 0.15 (*)    All other components within normal limits  COMPREHENSIVE METABOLIC PANEL - Abnormal; Notable for the following components:   Sodium 134 (*)    Potassium 6.7 (*)    BUN 24 (*)    Creatinine, Ser 1.40 (*)    All other components within normal limits     EKG None  Radiology No results found.  Procedures Procedures   Medications Ordered in ED Medications  insulin aspart (novoLOG) injection 10 Units (has no administration in time range)    And  dextrose 50 % solution 50 mL (has no administration in time range)  sodium bicarbonate injection 50 mEq (has no administration in time range)  sodium zirconium cyclosilicate (LOKELMA) packet 10 g (has no administration in time range)    ED Course  I have reviewed the triage vital signs and the nursing notes.  Pertinent labs & imaging results that were available during my care of the patient were reviewed by me and considered in my medical decision making (see chart for details). CRITICAL CARE Performed by: Milton Ferguson Total critical care time: 40 minutes Critical care time was exclusive of separately billable procedures and treating other patients. Critical care was necessary to treat or prevent imminent or life-threatening deterioration. Critical care was time spent personally by me on the following activities: development of treatment plan with patient and/or surrogate as well as nursing, discussions with consultants, evaluation of patient's response to treatment, examination of patient, obtaining history from patient or surrogate, ordering and performing treatments and interventions, ordering and review of laboratory studies, ordering and review of radiographic studies, pulse oximetry and re-evaluation of patient's condition. Patient with hyperkalemia.  Patient will be given bicarb low, I will and glucose and insulin.  Patient states he refuses to be admitted to the hospital I told him that he could have a fatal arrhythmia and die but he was not interested in staying in the hospital so he left AMA   MDM Rules/Calculators/A&P                          Patient with hyperkalemia.  Left AMA Final Clinical Impression(s) / ED Diagnoses Final diagnoses:  Hyperkalemia    Rx / DC  Orders ED Discharge Orders    None       Milton Ferguson, MD 01/16/21 1816

## 2021-01-16 NOTE — Progress Notes (Signed)
[facility] [phone]   Fax:([phone]  OFFICE PROGRESS NOTE  [name], MD [place] [address]  DIAGNOSIS: CLL/small lymphocytic lymphoma diagnosed in 2010 and has been in observation for several years with no treatment. 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 The cytogenetics were negative for P 17.   PRIOR THERAPY: None  CURRENT THERAPY: Obinutuzumab 1,000 mg IV. First dose on 08/10/2019. He will also begin acalabrutinib 100 mg BID starting on 08/19/2019.  Status post 14 cycles.  Obinutuzumab was discontinued after cycle #12 and the patient is currently on the oral acalabrutinib 100 mg p.o. twice daily.  INTERVAL HISTORY: Larry Lam 50 y.o. male returns to the clinic today for follow-up visit accompanied by his son.  The patient is doing fine today with no concerning complaints except for mild fatigue.  He has been tolerating his treatment with acalabrutinib fairly well.  He denied having any current chest pain, shortness of breath, cough or hemoptysis.  He denied having any fever or chills.  He has no nausea, vomiting, diarrhea or constipation.  He has no headache or visual changes.  He is here today for evaluation and repeat blood work.  The patient reported that he ate 2 bananas earlier today in addition to 4 energy drink including ghost and monster.  He denied having any current fever or chills.  He has no weight loss or night sweats.   MEDICAL HISTORY: Past Medical History:  Diagnosis Date  . Asthma   . cll dx'd 04/2019  . COPD (chronic obstructive pulmonary disease) ([place])   . Diabetes mellitus without complication ([place])   . Hypertension   . Seizures ([place])    3-4 years ago. Single episode.  . Tobacco use     ALLERGIES:  is allergic to amlodipine besylate-valsartan and hydrochlorothiazide.  MEDICATIONS:  Current Outpatient Medications  Medication Sig  Dispense Refill  . acalabrutinib (CALQUENCE) 100 MG capsule TAKE 1 CAPSULE (100 MG TOTAL) BY MOUTH 2 (TWO) TIMES DAILY. 60 capsule 2  . albuterol (PROVENTIL) (2.5 MG/3ML) 0.083% nebulizer solution Take 1 mL by nebulization as needed. Every 4-6 hours as needed    . amLODipine (NORVASC) 10 MG tablet Take 10 mg by mouth daily.    . ASPIRIN 81 PO Take 81 mg by mouth daily.     Marland Kitchen atorvastatin (LIPITOR) 40 MG tablet Take 40 mg by mouth at bedtime.    . carvedilol (COREG) 12.5 MG tablet Take 12.5 mg by mouth 2 (two) times daily.    Marland Kitchen EASY TOUCH PEN NEEDLES 31G X 6 MM MISC Inject 1 application into the skin daily.    Marland Kitchen escitalopram (LEXAPRO) 20 MG tablet Take 20 mg by mouth daily.    . fluticasone furoate-vilanterol (BREO ELLIPTA) 200-25 MCG/INH AEPB Inhale 1 puff into the lungs daily. 28 each 1  . HUMALOG KWIKPEN 100 UNIT/ML KwikPen Inject 15 Units into the skin as directed.    . Insulin Detemir (LEVEMIR) 100 UNIT/ML Pen Inject 30 Units into the skin daily. (Patient taking differently: Inject 46 Units into the skin daily. ) 18 mL 0  . Insulin Pen Needle (PEN NEEDLES 3/16") 31G X 5 MM MISC Use as directed with insulin pen 100 each 0  . ipratropium-albuterol (DUONEB) 0.5-2.5 (3) MG/3ML SOLN Take 3 mLs by nebulization every 6 (six) hours as needed. J44.9 and J44.1 360 mL 0  . lisinopril (ZESTRIL) 40  MG tablet Take 40 mg by mouth daily.    . metFORMIN (GLUCOPHAGE) 1000 MG tablet Take 1,000 mg by mouth 2 (two) times daily.    . metoprolol succinate (TOPROL-XL) 25 MG 24 hr tablet Take 25 mg by mouth daily.    Marland Kitchen NOVOLOG FLEXPEN 100 UNIT/ML FlexPen Inject into the skin as directed.     . pantoprazole (PROTONIX) 40 MG tablet Take 1 tablet (40 mg total) by mouth daily. 30 tablet 11  . VENTOLIN HFA 108 (90 Base) MCG/ACT inhaler Inhale 1-2 puffs into the lungs as directed.    . vitamin B-12 (CYANOCOBALAMIN) 1000 MCG tablet Take 1 tablet (1,000 mcg total) by mouth daily. 90 tablet 0   No current  facility-administered medications for this visit.    SURGICAL HISTORY: History reviewed. No pertinent surgical history.  REVIEW OF SYSTEMS:  Constitutional: positive for fatigue Eyes: negative Ears, nose, mouth, throat, and face: negative Respiratory: negative Cardiovascular: negative Gastrointestinal: negative Genitourinary:negative Integument/breast: negative Hematologic/lymphatic: negative Musculoskeletal:negative Neurological: negative Behavioral/Psych: negative Endocrine: negative Allergic/Immunologic: negative   PHYSICAL EXAMINATION: General appearance: alert, cooperative, fatigued and no distress Head: Normocephalic, without obvious abnormality, atraumatic Neck: no adenopathy, no JVD, supple, symmetrical, trachea midline and thyroid not enlarged, symmetric, no tenderness/mass/nodules Lymph nodes: Cervical, supraclavicular, and axillary nodes normal. Resp: clear to auscultation bilaterally Back: symmetric, no curvature. ROM normal. No CVA tenderness. Cardio: regular rate and rhythm, S1, S2 normal, no murmur, click, rub or gallop GI: soft, non-tender; bowel sounds normal; no masses,  no organomegaly Extremities: extremities normal, atraumatic, no cyanosis or edema Neurologic: Alert and oriented X 3, normal strength and tone. Normal symmetric reflexes. Normal coordination and gait  ECOG PERFORMANCE STATUS: 1 - Symptomatic but completely ambulatory  Blood pressure 113/70, pulse 94, temperature 98 F (36.7 C), temperature source Tympanic, resp. rate 20, height 5\' 10"  (1.778 m), weight 214 lb 3.2 oz (97.2 kg), SpO2 97 %.  LABORATORY DATA: Lab Results  Component Value Date   WBC 17.9 (H) 01/16/2021   HGB 12.5 (L) 01/16/2021   HCT 39.8 01/16/2021   MCV 90.5 01/16/2021   PLT 275 01/16/2021      Chemistry      Component Value Date/Time   NA 133 (L) 01/16/2021 1422   NA 137 07/06/2012 0506   K 7.1 (HH) 01/16/2021 1422   K 4.0 07/06/2012 0506   CL 102 01/16/2021 1422    CL 104 07/06/2012 0506   CO2 24 01/16/2021 1422   CO2 25 07/06/2012 0506   BUN 24 (H) 01/16/2021 1422   BUN 12 07/06/2012 0506   CREATININE 1.53 (H) 01/16/2021 1422   CREATININE 0.62 07/06/2012 0506      Component Value Date/Time   CALCIUM 9.6 01/16/2021 1422   CALCIUM 8.6 07/06/2012 0506   ALKPHOS 75 01/16/2021 1422   ALKPHOS 96 07/06/2012 0506   AST 11 (L) 01/16/2021 1422   ALT 11 01/16/2021 1422   ALT 30 07/06/2012 0506   BILITOT 0.3 01/16/2021 1422       RADIOGRAPHIC STUDIES: No results found.  ASSESSMENT AND PLAN: This is a very pleasant 50 years old white male with CLL/small lymphocytic lymphoma diagnosed in 2010 and has been in observation for several years but the patient had significant disease progression and August 2020. He is currently undergoing treatment with obinutuzumab every 4 weeks in addition to acalabrutinib 100 mg p.o. twice daily.  Status post 15 cycles. Obinutuzumab was discontinued after cycle #12.He is currently on treatment with acalabrutinib 100 mg p.o.  twice daily. The patient has been tolerating this treatment well with no concerning adverse effects. Repeat blood work today is unremarkable except for significantly high potassium level of 7.1 and on repeat it is still elevated at 6.9.  This is likely to his significant dietary intake today with the energy drink as well as the bananas.  He is also on lisinopril. I definitely recommend for the patient to go immediately to the emergency department for evaluation and management of his hypercalcemia.  I do not feel it is safe for the patient to go home and treat him on outpatient basis. The patient will come back for follow-up visit in 1 months for evaluation and repeat blood work. He was advised to call immediately if he has any other concerning symptoms in the interval. The patient voices understanding of current disease status and treatment options and is in agreement with the current care plan.  All  questions were answered. The patient knows to call the clinic with any problems, questions or concerns. We can certainly see the patient much sooner if necessary. The total time spent in the appointment was 40 minutes.   Disclaimer: This note was dictated with voice recognition software. Similar sounding words can inadvertently be transcribed and may not be corrected upon review.

## 2021-01-16 NOTE — Progress Notes (Signed)
Lab rep: Ulice Dash reported  CRITICAL VALUE: Potassium 7.1 with no visible hemolysis.  DATE & TIME NOTIFIED: 01/16/2021 3:00pm  This nurse spoke with MD's nurse and made aware of critical value. Acknowledged understanding.  No further questions at this time.

## 2021-01-16 NOTE — ED Triage Notes (Signed)
Patient states that he was  At the [facility] getting blood work completed. Patient had a Potassium level-6.9. Patient reports that he has ad a total of 4 large cans of Energy drinks today and states he drank one prior to having his blood drawn. Patient states the last one had a large amount of potassium in it (Ghost energy drink)

## 2021-01-17 ENCOUNTER — Telehealth: Payer: Self-pay | Admitting: Internal Medicine

## 2021-01-17 NOTE — Telephone Encounter (Signed)
Scheduled per los. Called and spoke with patient. Confirmed appt 

## 2021-01-30 ENCOUNTER — Other Ambulatory Visit (HOSPITAL_COMMUNITY): Payer: Self-pay

## 2021-02-06 ENCOUNTER — Other Ambulatory Visit (HOSPITAL_COMMUNITY): Payer: Self-pay

## 2021-02-14 ENCOUNTER — Other Ambulatory Visit: Payer: Self-pay

## 2021-02-14 ENCOUNTER — Inpatient Hospital Stay: Payer: Medicare Other

## 2021-02-14 ENCOUNTER — Inpatient Hospital Stay: Payer: Medicare Other | Attending: Internal Medicine | Admitting: Internal Medicine

## 2021-02-14 ENCOUNTER — Encounter: Payer: Self-pay | Admitting: Internal Medicine

## 2021-02-14 VITALS — BP 128/58 | HR 90 | Temp 97.3°F | Resp 18 | Ht 70.0 in | Wt 210.9 lb

## 2021-02-14 DIAGNOSIS — E119 Type 2 diabetes mellitus without complications: Secondary | ICD-10-CM | POA: Insufficient documentation

## 2021-02-14 DIAGNOSIS — I10 Essential (primary) hypertension: Secondary | ICD-10-CM | POA: Insufficient documentation

## 2021-02-14 DIAGNOSIS — Z794 Long term (current) use of insulin: Secondary | ICD-10-CM | POA: Diagnosis not present

## 2021-02-14 DIAGNOSIS — J449 Chronic obstructive pulmonary disease, unspecified: Secondary | ICD-10-CM | POA: Diagnosis not present

## 2021-02-14 DIAGNOSIS — Z7951 Long term (current) use of inhaled steroids: Secondary | ICD-10-CM | POA: Insufficient documentation

## 2021-02-14 DIAGNOSIS — Z7982 Long term (current) use of aspirin: Secondary | ICD-10-CM | POA: Diagnosis not present

## 2021-02-14 DIAGNOSIS — C9110 Chronic lymphocytic leukemia of B-cell type not having achieved remission: Secondary | ICD-10-CM | POA: Diagnosis present

## 2021-02-14 DIAGNOSIS — Z79899 Other long term (current) drug therapy: Secondary | ICD-10-CM | POA: Diagnosis not present

## 2021-02-14 DIAGNOSIS — E875 Hyperkalemia: Secondary | ICD-10-CM | POA: Diagnosis not present

## 2021-02-14 DIAGNOSIS — Z5111 Encounter for antineoplastic chemotherapy: Secondary | ICD-10-CM

## 2021-02-14 LAB — CMP (CANCER CENTER ONLY)
ALT: 8 U/L (ref 0–44)
AST: 9 U/L — ABNORMAL LOW (ref 15–41)
Albumin: 3.8 g/dL (ref 3.5–5.0)
Alkaline Phosphatase: 79 U/L (ref 38–126)
Anion gap: 8 (ref 5–15)
BUN: 16 mg/dL (ref 6–20)
CO2: 18 mmol/L — ABNORMAL LOW (ref 22–32)
Calcium: 9.4 mg/dL (ref 8.9–10.3)
Chloride: 109 mmol/L (ref 98–111)
Creatinine: 1.42 mg/dL — ABNORMAL HIGH (ref 0.61–1.24)
GFR, Estimated: 60 mL/min — ABNORMAL LOW (ref >60)
Glucose, Bld: 116 mg/dL — ABNORMAL HIGH (ref 70–99)
Potassium: 5.5 mmol/L — ABNORMAL HIGH (ref 3.5–5.1)
Sodium: 135 mmol/L (ref 135–145)
Total Bilirubin: <0.2 mg/dL — ABNORMAL LOW (ref 0.3–1.2)
Total Protein: 6.4 g/dL — ABNORMAL LOW (ref 6.5–8.1)

## 2021-02-14 LAB — CBC WITH DIFFERENTIAL (CANCER CENTER ONLY)
Abs Immature Granulocytes: 0.1 10*3/uL — ABNORMAL HIGH (ref 0.00–0.07)
Basophils Absolute: 0.1 10*3/uL (ref 0.0–0.1)
Basophils Relative: 1 %
Eosinophils Absolute: 0.3 10*3/uL (ref 0.0–0.5)
Eosinophils Relative: 2 %
HCT: 36.8 % — ABNORMAL LOW (ref 39.0–52.0)
Hemoglobin: 12.3 g/dL — ABNORMAL LOW (ref 13.0–17.0)
Immature Granulocytes: 1 %
Lymphocytes Relative: 20 %
Lymphs Abs: 2.8 10*3/uL (ref 0.7–4.0)
MCH: 28.7 pg (ref 26.0–34.0)
MCHC: 33.4 g/dL (ref 30.0–36.0)
MCV: 86 fL (ref 80.0–100.0)
Monocytes Absolute: 0.9 10*3/uL (ref 0.1–1.0)
Monocytes Relative: 7 %
Neutro Abs: 9.9 10*3/uL — ABNORMAL HIGH (ref 1.7–7.7)
Neutrophils Relative %: 69 %
Platelet Count: 225 10*3/uL (ref 150–400)
RBC: 4.28 MIL/uL (ref 4.22–5.81)
RDW: 15 % (ref 11.5–15.5)
WBC Count: 14.1 10*3/uL — ABNORMAL HIGH (ref 4.0–10.5)
nRBC: 0 % (ref 0.0–0.2)

## 2021-02-14 LAB — URIC ACID: Uric Acid, Serum: 8.6 mg/dL (ref 3.7–8.6)

## 2021-02-14 LAB — LACTATE DEHYDROGENASE: LDH: 92 U/L — ABNORMAL LOW (ref 98–192)

## 2021-02-14 NOTE — Progress Notes (Signed)
[facility] [phone]   Fax:([phone]  OFFICE PROGRESS NOTE  [name], MD [place] [address]  DIAGNOSIS: CLL/small lymphocytic lymphoma diagnosed in 2010 and has been in observation for several years with no treatment.  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  The cytogenetics were negative for P 17.    PRIOR THERAPY: None   CURRENT THERAPY: Obinutuzumab 1,000 mg IV. First dose on 08/10/2019. He will also begin acalabrutinib 100 mg BID starting on 08/19/2019.  Status post 18 cycles.  Obinutuzumab was discontinued after cycle #12 and the patient is currently on the oral acalabrutinib 100 mg p.o. twice daily.  INTERVAL HISTORY: Larry Lam 50 y.o. male returns to the clinic today for follow-up visit.  The patient is feeling fine today with no concerning complaints.  He denied having any chest pain, shortness of breath, cough or hemoptysis.  He denied having any fever or chills.  He has no nausea, vomiting, diarrhea or constipation.  He has no headache or visual changes.  He continues to tolerate his treatment with acalabrutinib fairly well.  The patient is here today for evaluation and repeat blood work.   MEDICAL HISTORY: Past Medical History:  Diagnosis Date   Asthma    cll dx'd 04/2019   COPD (chronic obstructive pulmonary disease) ([facility])    Diabetes mellitus without complication ([place])    Hypertension    Seizures ([place])    3-4 years ago. Single episode.   Tobacco use     ALLERGIES:  is allergic to amlodipine besylate-valsartan and hydrochlorothiazide.  MEDICATIONS:  Current Outpatient Medications  Medication Sig Dispense Refill   acalabrutinib (CALQUENCE) 100 MG capsule TAKE 1 CAPSULE (100 MG TOTAL) BY MOUTH 2 (TWO) TIMES DAILY. 60 capsule 2   albuterol (PROVENTIL) (2.5 MG/3ML) 0.083% nebulizer solution Take 1 mL by nebulization as needed. Every 4-6 hours as  needed     amLODipine (NORVASC) 10 MG tablet Take 10 mg by mouth daily.     ASPIRIN 81 PO Take 81 mg by mouth daily.      atorvastatin (LIPITOR) 40 MG tablet Take 40 mg by mouth at bedtime.     carvedilol (COREG) 12.5 MG tablet Take 12.5 mg by mouth 2 (two) times daily.     EASY TOUCH PEN NEEDLES 31G X 6 MM MISC Inject 1 application into the skin daily.     escitalopram (LEXAPRO) 20 MG tablet Take 20 mg by mouth daily.     fluticasone furoate-vilanterol (BREO ELLIPTA) 200-25 MCG/INH AEPB Inhale 1 puff into the lungs daily. 28 each 1   HUMALOG KWIKPEN 100 UNIT/ML KwikPen Inject 15 Units into the skin as directed.     Insulin Pen Needle (PEN NEEDLES 3/16") 31G X 5 MM MISC Use as directed with insulin pen 100 each 0   ipratropium-albuterol (DUONEB) 0.5-2.5 (3) MG/3ML SOLN Take 3 mLs by nebulization every 6 (six) hours as needed. J44.9 and J44.1 360 mL 0   lisinopril (ZESTRIL) 40 MG tablet Take 40 mg by mouth daily.     metFORMIN (GLUCOPHAGE) 1000 MG tablet Take 1,000 mg by mouth 2 (two) times daily.     metoprolol succinate (TOPROL-XL) 25 MG 24 hr tablet Take 25 mg by mouth daily.     NOVOLOG FLEXPEN 100 UNIT/ML FlexPen Inject into the skin as directed.      VENTOLIN HFA 108 (90 Base) MCG/ACT inhaler Inhale  1-2 puffs into the lungs as directed.     vitamin B-12 (CYANOCOBALAMIN) 1000 MCG tablet Take 1 tablet (1,000 mcg total) by mouth daily. 90 tablet 0   Insulin Detemir (LEVEMIR) 100 UNIT/ML Pen Inject 30 Units into the skin daily. (Patient taking differently: Inject 46 Units into the skin daily. ) 18 mL 0   pantoprazole (PROTONIX) 40 MG tablet Take 1 tablet (40 mg total) by mouth daily. 30 tablet 11   No current facility-administered medications for this visit.    SURGICAL HISTORY: No past surgical history on file.  REVIEW OF SYSTEMS:  A comprehensive review of systems was negative.   PHYSICAL EXAMINATION: General appearance: alert, cooperative, and no distress Head: Normocephalic, without  obvious abnormality, atraumatic Neck: no adenopathy, no JVD, supple, symmetrical, trachea midline, and thyroid not enlarged, symmetric, no tenderness/mass/nodules Lymph nodes: Cervical, supraclavicular, and axillary nodes normal. Resp: clear to auscultation bilaterally Back: symmetric, no curvature. ROM normal. No CVA tenderness. Cardio: regular rate and rhythm, S1, S2 normal, no murmur, click, rub or gallop GI: soft, non-tender; bowel sounds normal; no masses,  no organomegaly Extremities: extremities normal, atraumatic, no cyanosis or edema  ECOG PERFORMANCE STATUS: 1 - Symptomatic but completely ambulatory  Blood pressure (!) 128/58, pulse 90, temperature (!) 97.3 F (36.3 C), temperature source Tympanic, resp. rate 18, height 5\' 10"  (1.778 m), weight 210 lb 14.4 oz (95.7 kg), SpO2 97 %.  LABORATORY DATA: Lab Results  Component Value Date   WBC 14.1 (H) 02/14/2021   HGB 12.3 (L) 02/14/2021   HCT 36.8 (L) 02/14/2021   MCV 86.0 02/14/2021   PLT 225 02/14/2021      Chemistry      Component Value Date/Time   NA 135 02/14/2021 1457   NA 137 07/06/2012 0506   K 5.5 (H) 02/14/2021 1457   K 4.0 07/06/2012 0506   CL 109 02/14/2021 1457   CL 104 07/06/2012 0506   CO2 18 (L) 02/14/2021 1457   CO2 25 07/06/2012 0506   BUN 16 02/14/2021 1457   BUN 12 07/06/2012 0506   CREATININE 1.42 (H) 02/14/2021 1457   CREATININE 0.62 07/06/2012 0506      Component Value Date/Time   CALCIUM 9.4 02/14/2021 1457   CALCIUM 8.6 07/06/2012 0506   ALKPHOS 79 02/14/2021 1457   ALKPHOS 96 07/06/2012 0506   AST 9 (L) 02/14/2021 1457   ALT 8 02/14/2021 1457   ALT 30 07/06/2012 0506   BILITOT <0.2 (L) 02/14/2021 1457       RADIOGRAPHIC STUDIES: No results found.  ASSESSMENT AND PLAN: This is a very pleasant 50 years old white male with CLL/small lymphocytic lymphoma diagnosed in 2010 and has been in observation for several years but the patient had significant disease progression and August  2020. He is currently undergoing treatment with obinutuzumab every 4 weeks in addition to acalabrutinib 100 mg p.o. twice daily.  Status post 18 cycles. Obinutuzumab was discontinued after cycle #12.He is currently on treatment with acalabrutinib 100 mg p.o. twice daily. The patient has been tolerating this treatment well with no concerning adverse effects. Repeat blood work today is unremarkable except for mild hyperkalemia but not as bad as it was during his last visit.  He was advised to cut on potassium rich diet. I recommended for him to continue his current treatment with acalabrutinib with the same dose. I will see the patient back for follow-up visit in 1 months for evaluation and repeat blood work. He was advised to call  immediately if he has any concerning symptoms in the interval. The patient voices understanding of current disease status and treatment options and is in agreement with the current care plan.  All questions were answered. The patient knows to call the clinic with any problems, questions or concerns. We can certainly see the patient much sooner if necessary. The total time spent in the appointment was 40 minutes.   Disclaimer: This note was dictated with voice recognition software. Similar sounding words can inadvertently be transcribed and may not be corrected upon review.

## 2021-02-19 ENCOUNTER — Telehealth: Payer: Self-pay | Admitting: Internal Medicine

## 2021-02-19 NOTE — Telephone Encounter (Signed)
Scheduled per los. Called and spoke with patient. Confirmed appt 

## 2021-03-01 ENCOUNTER — Other Ambulatory Visit (HOSPITAL_COMMUNITY): Payer: Self-pay

## 2021-03-05 ENCOUNTER — Other Ambulatory Visit: Payer: Self-pay | Admitting: Internal Medicine

## 2021-03-05 ENCOUNTER — Other Ambulatory Visit (HOSPITAL_COMMUNITY): Payer: Self-pay

## 2021-03-05 DIAGNOSIS — C9110 Chronic lymphocytic leukemia of B-cell type not having achieved remission: Secondary | ICD-10-CM

## 2021-03-05 MED ORDER — CALQUENCE 100 MG PO CAPS
ORAL_CAPSULE | Freq: Two times a day (BID) | ORAL | 2 refills | Status: DC
  Filled 2021-03-05: qty 60, 30d supply, fill #0
  Filled 2021-04-02: qty 60, 30d supply, fill #1
  Filled 2021-04-26: qty 60, 30d supply, fill #2

## 2021-03-06 ENCOUNTER — Other Ambulatory Visit (HOSPITAL_COMMUNITY): Payer: Self-pay

## 2021-03-07 ENCOUNTER — Other Ambulatory Visit (HOSPITAL_COMMUNITY): Payer: Self-pay

## 2021-03-13 ENCOUNTER — Inpatient Hospital Stay: Payer: Medicare Other | Attending: Internal Medicine | Admitting: Internal Medicine

## 2021-03-13 ENCOUNTER — Inpatient Hospital Stay: Payer: Medicare Other

## 2021-03-13 ENCOUNTER — Other Ambulatory Visit: Payer: Self-pay

## 2021-03-13 VITALS — BP 145/64 | HR 95 | Temp 97.4°F | Resp 20 | Ht 70.0 in | Wt 208.6 lb

## 2021-03-13 DIAGNOSIS — Z7982 Long term (current) use of aspirin: Secondary | ICD-10-CM | POA: Insufficient documentation

## 2021-03-13 DIAGNOSIS — Z7951 Long term (current) use of inhaled steroids: Secondary | ICD-10-CM | POA: Insufficient documentation

## 2021-03-13 DIAGNOSIS — E119 Type 2 diabetes mellitus without complications: Secondary | ICD-10-CM | POA: Insufficient documentation

## 2021-03-13 DIAGNOSIS — I10 Essential (primary) hypertension: Secondary | ICD-10-CM | POA: Insufficient documentation

## 2021-03-13 DIAGNOSIS — C9110 Chronic lymphocytic leukemia of B-cell type not having achieved remission: Secondary | ICD-10-CM | POA: Insufficient documentation

## 2021-03-13 DIAGNOSIS — Z794 Long term (current) use of insulin: Secondary | ICD-10-CM | POA: Insufficient documentation

## 2021-03-13 DIAGNOSIS — J449 Chronic obstructive pulmonary disease, unspecified: Secondary | ICD-10-CM | POA: Insufficient documentation

## 2021-03-13 DIAGNOSIS — Z79899 Other long term (current) drug therapy: Secondary | ICD-10-CM | POA: Diagnosis not present

## 2021-03-13 LAB — LACTATE DEHYDROGENASE: LDH: 99 U/L (ref 98–192)

## 2021-03-13 LAB — CMP (CANCER CENTER ONLY)
ALT: 9 U/L (ref 0–44)
AST: 10 U/L — ABNORMAL LOW (ref 15–41)
Albumin: 3.7 g/dL (ref 3.5–5.0)
Alkaline Phosphatase: 85 U/L (ref 38–126)
Anion gap: 8 (ref 5–15)
BUN: 19 mg/dL (ref 6–20)
CO2: 25 mmol/L (ref 22–32)
Calcium: 10.3 mg/dL (ref 8.9–10.3)
Chloride: 105 mmol/L (ref 98–111)
Creatinine: 1.35 mg/dL — ABNORMAL HIGH (ref 0.61–1.24)
GFR, Estimated: >60 mL/min (ref >60)
Glucose, Bld: 158 mg/dL — ABNORMAL HIGH (ref 70–99)
Potassium: 4.9 mmol/L (ref 3.5–5.1)
Sodium: 138 mmol/L (ref 135–145)
Total Bilirubin: 0.2 mg/dL — ABNORMAL LOW (ref 0.3–1.2)
Total Protein: 6.7 g/dL (ref 6.5–8.1)

## 2021-03-13 LAB — CBC WITH DIFFERENTIAL (CANCER CENTER ONLY)
Abs Immature Granulocytes: 0.22 10*3/uL — ABNORMAL HIGH (ref 0.00–0.07)
Basophils Absolute: 0.1 10*3/uL (ref 0.0–0.1)
Basophils Relative: 1 %
Eosinophils Absolute: 0.4 10*3/uL (ref 0.0–0.5)
Eosinophils Relative: 2 %
HCT: 41.2 % (ref 39.0–52.0)
Hemoglobin: 13.3 g/dL (ref 13.0–17.0)
Immature Granulocytes: 1 %
Lymphocytes Relative: 14 %
Lymphs Abs: 3 10*3/uL (ref 0.7–4.0)
MCH: 28.9 pg (ref 26.0–34.0)
MCHC: 32.3 g/dL (ref 30.0–36.0)
MCV: 89.6 fL (ref 80.0–100.0)
Monocytes Absolute: 1.4 10*3/uL — ABNORMAL HIGH (ref 0.1–1.0)
Monocytes Relative: 7 %
Neutro Abs: 15.9 10*3/uL — ABNORMAL HIGH (ref 1.7–7.7)
Neutrophils Relative %: 75 %
Platelet Count: 252 10*3/uL (ref 150–400)
RBC: 4.6 MIL/uL (ref 4.22–5.81)
RDW: 14.9 % (ref 11.5–15.5)
WBC Count: 21.1 10*3/uL — ABNORMAL HIGH (ref 4.0–10.5)
nRBC: 0 % (ref 0.0–0.2)

## 2021-03-13 NOTE — Progress Notes (Signed)
[facility] Telephone:([phone]   Fax:(336) [phone]  OFFICE PROGRESS NOTE  [name], MD [place] [address]  DIAGNOSIS: CLL/small lymphocytic lymphoma diagnosed in 2010 and has been in observation for several years with no treatment.  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  The cytogenetics were negative for P 17.    PRIOR THERAPY: None   CURRENT THERAPY: Obinutuzumab 1,000 mg IV. First dose on 08/10/2019. He will also begin acalabrutinib 100 mg BID starting on 08/19/2019.  Status post 18 cycles.  Obinutuzumab was discontinued after cycle #12 and the patient is currently on the oral acalabrutinib 100 mg p.o. twice daily.  INTERVAL HISTORY: Larry Lam Larry Lam 50 y.o. male returns to the clinic today for follow-up visit.  The patient is feeling fine today with no concerning complaints.  He denied having any current chest pain, shortness of breath, cough or hemoptysis.  He has no nausea, vomiting, diarrhea or constipation.  He has no weight loss or night sweats.  He continues to tolerate his treatment with acalabrutinib fairly well.  The patient is here today for evaluation and repeat blood work.  MEDICAL HISTORY: Past Medical History:  Diagnosis Date   Asthma    cll dx'd 04/2019   COPD (chronic obstructive pulmonary disease) ([facility])    Diabetes mellitus without complication ([place])    Hypertension    Seizures ([place])    3-4 years ago. Single episode.   Tobacco use     ALLERGIES:  is allergic to amlodipine besylate-valsartan and hydrochlorothiazide.  MEDICATIONS:  Current Outpatient Medications  Medication Sig Dispense Refill   acalabrutinib (CALQUENCE) 100 MG capsule TAKE 1 CAPSULE (100 MG TOTAL) BY MOUTH 2 (TWO) TIMES DAILY. 60 capsule 2   albuterol (PROVENTIL) (2.5 MG/3ML) 0.083% nebulizer solution Take 1 mL by nebulization as needed. Every 4-6 hours as needed     amLODipine  (NORVASC) 10 MG tablet Take 10 mg by mouth daily.     ASPIRIN 81 PO Take 81 mg by mouth daily.      atorvastatin (LIPITOR) 40 MG tablet Take 40 mg by mouth at bedtime.     carvedilol (COREG) 12.5 MG tablet Take 12.5 mg by mouth 2 (two) times daily.     EASY TOUCH PEN NEEDLES 31G X 6 MM MISC Inject 1 application into the skin daily.     escitalopram (LEXAPRO) 20 MG tablet Take 20 mg by mouth daily.     fluticasone furoate-vilanterol (BREO ELLIPTA) 200-25 MCG/INH AEPB Inhale 1 puff into the lungs daily. 28 each 1   HUMALOG KWIKPEN 100 UNIT/ML KwikPen Inject 15 Units into the skin as directed.     Insulin Pen Needle (PEN NEEDLES 3/16") 31G X 5 MM MISC Use as directed with insulin pen 100 each 0   ipratropium-albuterol (DUONEB) 0.5-2.5 (3) MG/3ML SOLN Take 3 mLs by nebulization every 6 (six) hours as needed. J44.9 and J44.1 360 mL 0   lisinopril (ZESTRIL) 40 MG tablet Take 40 mg by mouth daily.     metFORMIN (GLUCOPHAGE) 1000 MG tablet Take 1,000 mg by mouth 2 (two) times daily.     metoprolol succinate (TOPROL-XL) 25 MG 24 hr tablet Take 25 mg by mouth daily.     NOVOLOG FLEXPEN 100 UNIT/ML FlexPen Inject into the skin as directed.      VENTOLIN HFA 108 (90 Base) MCG/ACT inhaler Inhale 1-2 puffs into the lungs as directed.  vitamin B-12 (CYANOCOBALAMIN) 1000 MCG tablet Take 1 tablet (1,000 mcg total) by mouth daily. 90 tablet 0   Insulin Detemir (LEVEMIR) 100 UNIT/ML Pen Inject 30 Units into the skin daily. (Patient taking differently: Inject 46 Units into the skin daily. ) 18 mL 0   pantoprazole (PROTONIX) 40 MG tablet Take 1 tablet (40 mg total) by mouth daily. 30 tablet 11   No current facility-administered medications for this visit.    SURGICAL HISTORY: No past surgical history on file.  REVIEW OF SYSTEMS:  A comprehensive review of systems was negative.   PHYSICAL EXAMINATION: General appearance: alert, cooperative, and no distress Head: Normocephalic, without obvious abnormality,  atraumatic Neck: no adenopathy, no JVD, supple, symmetrical, trachea midline, and thyroid not enlarged, symmetric, no tenderness/mass/nodules Lymph nodes: Cervical, supraclavicular, and axillary nodes normal. Resp: clear to auscultation bilaterally Back: symmetric, no curvature. ROM normal. No CVA tenderness. Cardio: regular rate and rhythm, S1, S2 normal, no murmur, click, rub or gallop GI: soft, non-tender; bowel sounds normal; no masses,  no organomegaly Extremities: extremities normal, atraumatic, no cyanosis or edema  ECOG PERFORMANCE STATUS: 1 - Symptomatic but completely ambulatory  Blood pressure (!) 145/64, pulse 95, temperature (!) 97.4 F (36.3 C), temperature source Tympanic, resp. rate 20, height 5\' 10"  (1.778 m), weight 208 lb 9.6 oz (94.6 kg), SpO2 96 %.  LABORATORY DATA: Lab Results  Component Value Date   WBC 21.1 (H) 03/13/2021   HGB 13.3 03/13/2021   HCT 41.2 03/13/2021   MCV 89.6 03/13/2021   PLT 252 03/13/2021      Chemistry      Component Value Date/Time   NA 135 02/14/2021 1457   NA 137 07/06/2012 0506   K 5.5 (H) 02/14/2021 1457   K 4.0 07/06/2012 0506   CL 109 02/14/2021 1457   CL 104 07/06/2012 0506   CO2 18 (L) 02/14/2021 1457   CO2 25 07/06/2012 0506   BUN 16 02/14/2021 1457   BUN 12 07/06/2012 0506   CREATININE 1.42 (H) 02/14/2021 1457   CREATININE 0.62 07/06/2012 0506      Component Value Date/Time   CALCIUM 9.4 02/14/2021 1457   CALCIUM 8.6 07/06/2012 0506   ALKPHOS 79 02/14/2021 1457   ALKPHOS 96 07/06/2012 0506   AST 9 (L) 02/14/2021 1457   ALT 8 02/14/2021 1457   ALT 30 07/06/2012 0506   BILITOT <0.2 (L) 02/14/2021 1457       RADIOGRAPHIC STUDIES: No results found.  ASSESSMENT AND PLAN: This is a very pleasant 50 years old white male with CLL/small lymphocytic lymphoma diagnosed in 2010 and has been in observation for several years but the patient had significant disease progression and August 2020. He is currently undergoing  treatment with obinutuzumab every 4 weeks in addition to acalabrutinib 100 mg p.o. twice daily.  Status post 18 cycles. Obinutuzumab was discontinued after cycle #12.He is currently on treatment with acalabrutinib 100 mg p.o. twice daily. He has been tolerating his treatment with acalabrutinib fairly well. CBC today showed elevated white blood count of 21,000.  I recommended for the patient to continue his treatment with acalabrutinib and I will continue to monitor his blood count closely on upcoming blood work in 1 months. The patient was advised to call immediately if he has any other concerning symptoms in the interval. The patient voices understanding of current disease status and treatment options and is in agreement with the current care plan. All questions were answered. The patient knows to call the  clinic with any problems, questions or concerns. We can certainly see the patient much sooner if necessary.   Disclaimer: This note was dictated with voice recognition software. Similar sounding words can inadvertently be transcribed and may not be corrected upon review.

## 2021-03-15 ENCOUNTER — Telehealth: Payer: Self-pay | Admitting: Internal Medicine

## 2021-03-15 NOTE — Telephone Encounter (Signed)
Scheduled per los. Called and spoke with patient. Confirmed appt 

## 2021-03-21 ENCOUNTER — Other Ambulatory Visit (HOSPITAL_COMMUNITY): Payer: Self-pay

## 2021-03-23 ENCOUNTER — Other Ambulatory Visit (HOSPITAL_COMMUNITY): Payer: Self-pay

## 2021-04-02 ENCOUNTER — Other Ambulatory Visit (HOSPITAL_COMMUNITY): Payer: Self-pay

## 2021-04-16 ENCOUNTER — Ambulatory Visit (HOSPITAL_BASED_OUTPATIENT_CLINIC_OR_DEPARTMENT_OTHER): Payer: Medicare Other

## 2021-04-16 ENCOUNTER — Inpatient Hospital Stay: Payer: Medicare Other

## 2021-04-16 ENCOUNTER — Other Ambulatory Visit: Payer: Self-pay

## 2021-04-16 ENCOUNTER — Inpatient Hospital Stay: Payer: Medicare Other | Attending: Internal Medicine | Admitting: Internal Medicine

## 2021-04-16 ENCOUNTER — Encounter: Payer: Self-pay | Admitting: Internal Medicine

## 2021-04-16 VITALS — BP 135/64 | HR 96 | Temp 98.6°F | Resp 19 | Ht 70.0 in | Wt 212.0 lb

## 2021-04-16 DIAGNOSIS — C9110 Chronic lymphocytic leukemia of B-cell type not having achieved remission: Secondary | ICD-10-CM | POA: Insufficient documentation

## 2021-04-16 DIAGNOSIS — Z79899 Other long term (current) drug therapy: Secondary | ICD-10-CM | POA: Diagnosis not present

## 2021-04-16 DIAGNOSIS — Z794 Long term (current) use of insulin: Secondary | ICD-10-CM | POA: Diagnosis not present

## 2021-04-16 DIAGNOSIS — Z23 Encounter for immunization: Secondary | ICD-10-CM

## 2021-04-16 DIAGNOSIS — J449 Chronic obstructive pulmonary disease, unspecified: Secondary | ICD-10-CM | POA: Diagnosis not present

## 2021-04-16 DIAGNOSIS — Z7982 Long term (current) use of aspirin: Secondary | ICD-10-CM | POA: Diagnosis not present

## 2021-04-16 DIAGNOSIS — I10 Essential (primary) hypertension: Secondary | ICD-10-CM | POA: Diagnosis not present

## 2021-04-16 DIAGNOSIS — R599 Enlarged lymph nodes, unspecified: Secondary | ICD-10-CM | POA: Diagnosis not present

## 2021-04-16 DIAGNOSIS — E119 Type 2 diabetes mellitus without complications: Secondary | ICD-10-CM | POA: Diagnosis not present

## 2021-04-16 DIAGNOSIS — Z7951 Long term (current) use of inhaled steroids: Secondary | ICD-10-CM | POA: Diagnosis not present

## 2021-04-16 DIAGNOSIS — Z5111 Encounter for antineoplastic chemotherapy: Secondary | ICD-10-CM

## 2021-04-16 LAB — CBC WITH DIFFERENTIAL (CANCER CENTER ONLY)
Abs Immature Granulocytes: 0.1 10*3/uL — ABNORMAL HIGH (ref 0.00–0.07)
Basophils Absolute: 0.1 10*3/uL (ref 0.0–0.1)
Basophils Relative: 0 %
Eosinophils Absolute: 0.5 10*3/uL (ref 0.0–0.5)
Eosinophils Relative: 2 %
HCT: 41.6 % (ref 39.0–52.0)
Hemoglobin: 13 g/dL (ref 13.0–17.0)
Immature Granulocytes: 1 %
Lymphocytes Relative: 11 %
Lymphs Abs: 2.2 10*3/uL (ref 0.7–4.0)
MCH: 29.3 pg (ref 26.0–34.0)
MCHC: 31.3 g/dL (ref 30.0–36.0)
MCV: 93.7 fL (ref 80.0–100.0)
Monocytes Absolute: 1.4 10*3/uL — ABNORMAL HIGH (ref 0.1–1.0)
Monocytes Relative: 7 %
Neutro Abs: 15.9 10*3/uL — ABNORMAL HIGH (ref 1.7–7.7)
Neutrophils Relative %: 79 %
Platelet Count: 234 10*3/uL (ref 150–400)
RBC: 4.44 MIL/uL (ref 4.22–5.81)
RDW: 14.6 % (ref 11.5–15.5)
WBC Count: 20.2 10*3/uL — ABNORMAL HIGH (ref 4.0–10.5)
nRBC: 0 % (ref 0.0–0.2)

## 2021-04-16 LAB — CMP (CANCER CENTER ONLY)
ALT: 14 U/L (ref 0–44)
AST: 11 U/L — ABNORMAL LOW (ref 15–41)
Albumin: 3.6 g/dL (ref 3.5–5.0)
Alkaline Phosphatase: 87 U/L (ref 38–126)
Anion gap: 12 (ref 5–15)
BUN: 21 mg/dL — ABNORMAL HIGH (ref 6–20)
CO2: 24 mmol/L (ref 22–32)
Calcium: 9.4 mg/dL (ref 8.9–10.3)
Chloride: 102 mmol/L (ref 98–111)
Creatinine: 1.47 mg/dL — ABNORMAL HIGH (ref 0.61–1.24)
GFR, Estimated: 57 mL/min — ABNORMAL LOW (ref >60)
Glucose, Bld: 230 mg/dL — ABNORMAL HIGH (ref 70–99)
Potassium: 5.5 mmol/L — ABNORMAL HIGH (ref 3.5–5.1)
Sodium: 138 mmol/L (ref 135–145)
Total Bilirubin: 0.3 mg/dL (ref 0.3–1.2)
Total Protein: 6.5 g/dL (ref 6.5–8.1)

## 2021-04-16 LAB — LACTATE DEHYDROGENASE: LDH: 134 U/L (ref 98–192)

## 2021-04-16 LAB — URIC ACID: Uric Acid, Serum: 8.4 mg/dL (ref 3.7–8.6)

## 2021-04-16 NOTE — Progress Notes (Signed)
   Covid-19 Vaccination Clinic  Name:  Larry Lam    MRN: [id number] DOB: 12/10/1970  04/16/2021  [name] was observed post Covid-19 immunization for 15 minutes without incident. He was provided with Vaccine Information Sheet and instruction to access the V-Safe system.   [name] was instructed to call 911 with any severe reactions post vaccine: Difficulty breathing  Swelling of face and throat  A fast heartbeat  A bad rash all over body  Dizziness and weakness

## 2021-04-16 NOTE — Progress Notes (Signed)
[facility] [phone]   [phone]  OFFICE PROGRESS NOTE  [name], MD [place] [address]  DIAGNOSIS: CLL/small lymphocytic lymphoma diagnosed in 2010 and has been in observation for several years with no treatment.  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  The cytogenetics were negative for P 17.    PRIOR THERAPY: None   CURRENT THERAPY: Obinutuzumab 1,000 mg IV. First dose on 08/10/2019. He will also begin acalabrutinib 100 mg BID starting on 08/19/2019.  Status post 19 cycles.  Obinutuzumab was discontinued after cycle #12 and the patient is currently on the oral acalabrutinib 100 mg p.o. twice daily.  INTERVAL HISTORY: Larry Lam 50 y.o. male returns to the clinic today for follow-up visit.  The patient is feeling fine today with no concerning complaints.  He denied having any current chest pain, shortness of breath except with exertion with no cough or hemoptysis.  He has no nausea, vomiting, diarrhea or constipation.  He has no headache or visual changes.  He has no weight loss or night sweats.  He continues to tolerate his treatment with acalabrutinib fairly well.  He is here today for evaluation and repeat blood work.  MEDICAL HISTORY: Past Medical History:  Diagnosis Date   Asthma    cll dx'd 04/2019   COPD (chronic obstructive pulmonary disease) (HCC)    Diabetes mellitus without complication ([place])    Hypertension    Seizures ([place])    3-4 years ago. Single episode.   Tobacco use     ALLERGIES:  is allergic to amlodipine besylate-valsartan and hydrochlorothiazide.  MEDICATIONS:  Current Outpatient Medications  Medication Sig Dispense Refill   acalabrutinib (CALQUENCE) 100 MG capsule TAKE 1 CAPSULE (100 MG TOTAL) BY MOUTH 2 (TWO) TIMES DAILY. 60 capsule 2   albuterol (PROVENTIL) (2.5 MG/3ML) 0.083% nebulizer solution Take 1 mL by nebulization as  needed. Every 4-6 hours as needed     amLODipine (NORVASC) 10 MG tablet Take 10 mg by mouth daily.     ASPIRIN 81 PO Take 81 mg by mouth daily.      atorvastatin (LIPITOR) 40 MG tablet Take 40 mg by mouth at bedtime.     carvedilol (COREG) 12.5 MG tablet Take 12.5 mg by mouth 2 (two) times daily.     EASY TOUCH PEN NEEDLES 31G X 6 MM MISC Inject 1 application into the skin daily.     escitalopram (LEXAPRO) 20 MG tablet Take 20 mg by mouth daily.     fluticasone furoate-vilanterol (BREO ELLIPTA) 200-25 MCG/INH AEPB Inhale 1 puff into the lungs daily. 28 each 1   HUMALOG KWIKPEN 100 UNIT/ML KwikPen Inject 15 Units into the skin as directed.     Insulin Detemir (LEVEMIR) 100 UNIT/ML Pen Inject 30 Units into the skin daily. (Patient taking differently: Inject 46 Units into the skin daily. ) 18 mL 0   Insulin Pen Needle (PEN NEEDLES 3/16") 31G X 5 MM MISC Use as directed with insulin pen 100 each 0   ipratropium-albuterol (DUONEB) 0.5-2.5 (3) MG/3ML SOLN Take 3 mLs by nebulization every 6 (six) hours as needed. J44.9 and J44.1 360 mL 0   lisinopril (ZESTRIL) 40 MG tablet Take 40 mg by mouth daily.     metFORMIN (GLUCOPHAGE) 1000 MG tablet Take 1,000 mg by mouth 2 (two) times daily.     metoprolol succinate (TOPROL-XL) 25 MG 24 hr tablet  Take 25 mg by mouth daily.     NOVOLOG FLEXPEN 100 UNIT/ML FlexPen Inject into the skin as directed.      pantoprazole (PROTONIX) 40 MG tablet Take 1 tablet (40 mg total) by mouth daily. 30 tablet 11   VENTOLIN HFA 108 (90 Base) MCG/ACT inhaler Inhale 1-2 puffs into the lungs as directed.     vitamin B-12 (CYANOCOBALAMIN) 1000 MCG tablet Take 1 tablet (1,000 mcg total) by mouth daily. 90 tablet 0   No current facility-administered medications for this visit.    SURGICAL HISTORY: No past surgical history on file.  REVIEW OF SYSTEMS:  A comprehensive review of systems was negative except for: Respiratory: positive for dyspnea on exertion   PHYSICAL EXAMINATION:  General appearance: alert, cooperative, and no distress Head: Normocephalic, without obvious abnormality, atraumatic Neck: no adenopathy, no JVD, supple, symmetrical, trachea midline, and thyroid not enlarged, symmetric, no tenderness/mass/nodules Lymph nodes: Cervical, supraclavicular, and axillary nodes normal. Resp: clear to auscultation bilaterally Back: symmetric, no curvature. ROM normal. No CVA tenderness. Cardio: regular rate and rhythm, S1, S2 normal, no murmur, click, rub or gallop GI: soft, non-tender; bowel sounds normal; no masses,  no organomegaly Extremities: extremities normal, atraumatic, no cyanosis or edema  ECOG PERFORMANCE STATUS: 1 - Symptomatic but completely ambulatory  Blood pressure 135/64, pulse 96, temperature 98.6 F (37 C), temperature source Tympanic, resp. rate 19, height '5\' 10"'$  (1.778 m), weight 212 lb (96.2 kg), SpO2 97 %.  LABORATORY DATA: Lab Results  Component Value Date   WBC 20.2 (H) 04/16/2021   HGB 13.0 04/16/2021   HCT 41.6 04/16/2021   MCV 93.7 04/16/2021   PLT 234 04/16/2021      Chemistry      Component Value Date/Time   NA 138 04/16/2021 0835   NA 137 07/06/2012 0506   K 5.5 (H) 04/16/2021 0835   K 4.0 07/06/2012 0506   CL 102 04/16/2021 0835   CL 104 07/06/2012 0506   CO2 24 04/16/2021 0835   CO2 25 07/06/2012 0506   BUN 21 (H) 04/16/2021 0835   BUN 12 07/06/2012 0506   CREATININE 1.47 (H) 04/16/2021 0835   CREATININE 0.62 07/06/2012 0506      Component Value Date/Time   CALCIUM 9.4 04/16/2021 0835   CALCIUM 8.6 07/06/2012 0506   ALKPHOS 87 04/16/2021 0835   ALKPHOS 96 07/06/2012 0506   AST 11 (L) 04/16/2021 0835   ALT 14 04/16/2021 0835   ALT 30 07/06/2012 0506   BILITOT 0.3 04/16/2021 0835       RADIOGRAPHIC STUDIES: No results found.  ASSESSMENT AND PLAN: This is a very pleasant 50 years old white male with CLL/small lymphocytic lymphoma diagnosed in 2010 and has been in observation for several years but the  patient had significant disease progression and August 2020. He is currently undergoing treatment with obinutuzumab every 4 weeks in addition to acalabrutinib 100 mg p.o. twice daily.  Status post 19 cycles. Obinutuzumab was discontinued after cycle #12.He is currently on treatment with acalabrutinib 100 mg p.o. twice daily. The patient continues to tolerate his treatment with acalabrutinib fairly well. I recommended for the patient to continue his treatment with acalabrutinib with the same dose. I will see him back for follow-up visit in 1 month with repeat CT scan of the chest, abdomen pelvis for restaging of his disease. The patient was advised to call immediately if he has any concerning symptoms in the interval. The patient voices understanding of current disease status and  treatment options and is in agreement with the current care plan. All questions were answered. The patient knows to call the clinic with any problems, questions or concerns. We can certainly see the patient much sooner if necessary.   Disclaimer: This note was dictated with voice recognition software. Similar sounding words can inadvertently be transcribed and may not be corrected upon review.

## 2021-04-16 NOTE — Progress Notes (Signed)
   Covid-19 Vaccination Clinic  Name:  Larry Lam    MRN: [id number] DOB: 26-Dec-1970  04/16/2021  [name] was observed post Covid-19 immunization for 15 minutes without incident. He was provided with Vaccine Information Sheet and instruction to access the V-Safe system.   [name] was instructed to call 911 with any severe reactions post vaccine: Difficulty breathing  Swelling of face and throat  A fast heartbeat  A bad rash all over body  Dizziness and weakness

## 2021-04-19 ENCOUNTER — Telehealth: Payer: Self-pay | Admitting: Internal Medicine

## 2021-04-19 NOTE — Telephone Encounter (Signed)
Scheduled per los. Called and left msg. Mailed printout  °

## 2021-04-24 ENCOUNTER — Other Ambulatory Visit (HOSPITAL_COMMUNITY): Payer: Self-pay

## 2021-04-26 ENCOUNTER — Other Ambulatory Visit (HOSPITAL_COMMUNITY): Payer: Self-pay

## 2021-05-01 ENCOUNTER — Other Ambulatory Visit (HOSPITAL_COMMUNITY): Payer: Self-pay

## 2021-05-15 ENCOUNTER — Ambulatory Visit (HOSPITAL_COMMUNITY)
Admission: RE | Admit: 2021-05-15 | Discharge: 2021-05-15 | Disposition: A | Discharge status: Home / Self Care | Payer: Medicare Other | Source: Ambulatory Visit | Attending: Internal Medicine | Admitting: Internal Medicine

## 2021-05-15 ENCOUNTER — Ambulatory Visit: Payer: Medicare Other | Admitting: Internal Medicine

## 2021-05-15 ENCOUNTER — Other Ambulatory Visit: Payer: Self-pay

## 2021-05-15 ENCOUNTER — Inpatient Hospital Stay: Payer: Medicare Other | Attending: Internal Medicine

## 2021-05-15 ENCOUNTER — Encounter (HOSPITAL_COMMUNITY): Payer: Self-pay

## 2021-05-15 DIAGNOSIS — R599 Enlarged lymph nodes, unspecified: Secondary | ICD-10-CM | POA: Diagnosis not present

## 2021-05-15 DIAGNOSIS — E119 Type 2 diabetes mellitus without complications: Secondary | ICD-10-CM | POA: Insufficient documentation

## 2021-05-15 DIAGNOSIS — I10 Essential (primary) hypertension: Secondary | ICD-10-CM | POA: Insufficient documentation

## 2021-05-15 DIAGNOSIS — C9110 Chronic lymphocytic leukemia of B-cell type not having achieved remission: Secondary | ICD-10-CM | POA: Insufficient documentation

## 2021-05-15 DIAGNOSIS — Z7982 Long term (current) use of aspirin: Secondary | ICD-10-CM | POA: Insufficient documentation

## 2021-05-15 DIAGNOSIS — J449 Chronic obstructive pulmonary disease, unspecified: Secondary | ICD-10-CM | POA: Insufficient documentation

## 2021-05-15 DIAGNOSIS — Z79899 Other long term (current) drug therapy: Secondary | ICD-10-CM | POA: Insufficient documentation

## 2021-05-15 DIAGNOSIS — Z794 Long term (current) use of insulin: Secondary | ICD-10-CM | POA: Insufficient documentation

## 2021-05-15 DIAGNOSIS — Z7951 Long term (current) use of inhaled steroids: Secondary | ICD-10-CM | POA: Insufficient documentation

## 2021-05-15 LAB — CBC WITH DIFFERENTIAL (CANCER CENTER ONLY)
Abs Immature Granulocytes: 0.03 10*3/uL (ref 0.00–0.07)
Basophils Absolute: 0.1 10*3/uL (ref 0.0–0.1)
Basophils Relative: 1 %
Eosinophils Absolute: 0.3 10*3/uL (ref 0.0–0.5)
Eosinophils Relative: 4 %
HCT: 44 % (ref 39.0–52.0)
Hemoglobin: 13.7 g/dL (ref 13.0–17.0)
Immature Granulocytes: 0 %
Lymphocytes Relative: 25 %
Lymphs Abs: 2.2 10*3/uL (ref 0.7–4.0)
MCH: 28.7 pg (ref 26.0–34.0)
MCHC: 31.1 g/dL (ref 30.0–36.0)
MCV: 92.2 fL (ref 80.0–100.0)
Monocytes Absolute: 1.4 10*3/uL — ABNORMAL HIGH (ref 0.1–1.0)
Monocytes Relative: 16 %
Neutro Abs: 4.7 10*3/uL (ref 1.7–7.7)
Neutrophils Relative %: 54 %
Platelet Count: 218 10*3/uL (ref 150–400)
RBC: 4.77 MIL/uL (ref 4.22–5.81)
RDW: 14.1 % (ref 11.5–15.5)
WBC Count: 8.7 10*3/uL (ref 4.0–10.5)
nRBC: 0 % (ref 0.0–0.2)

## 2021-05-15 LAB — CMP (CANCER CENTER ONLY)
ALT: 17 U/L (ref 0–44)
AST: 13 U/L — ABNORMAL LOW (ref 15–41)
Albumin: 3.8 g/dL (ref 3.5–5.0)
Alkaline Phosphatase: 108 U/L (ref 38–126)
Anion gap: 9 (ref 5–15)
BUN: 20 mg/dL (ref 6–20)
CO2: 28 mmol/L (ref 22–32)
Calcium: 10.2 mg/dL (ref 8.9–10.3)
Chloride: 102 mmol/L (ref 98–111)
Creatinine: 1.36 mg/dL — ABNORMAL HIGH (ref 0.61–1.24)
GFR, Estimated: >60 mL/min (ref >60)
Glucose, Bld: 161 mg/dL — ABNORMAL HIGH (ref 70–99)
Potassium: 5.5 mmol/L — ABNORMAL HIGH (ref 3.5–5.1)
Sodium: 139 mmol/L (ref 135–145)
Total Bilirubin: 0.3 mg/dL (ref 0.3–1.2)
Total Protein: 7 g/dL (ref 6.5–8.1)

## 2021-05-15 LAB — LACTATE DEHYDROGENASE: LDH: 157 U/L (ref 98–192)

## 2021-05-15 LAB — URIC ACID: Uric Acid, Serum: 7 mg/dL (ref 3.7–8.6)

## 2021-05-15 MED ORDER — IOHEXOL 350 MG/ML SOLN
80.0000 mL | Freq: Once | INTRAVENOUS | Status: AC | PRN
  Administered 2021-05-15: 80 mL via INTRAVENOUS

## 2021-05-17 ENCOUNTER — Ambulatory Visit: Payer: Medicare Other | Admitting: Internal Medicine

## 2021-05-21 ENCOUNTER — Other Ambulatory Visit: Payer: Self-pay

## 2021-05-21 ENCOUNTER — Inpatient Hospital Stay (HOSPITAL_BASED_OUTPATIENT_CLINIC_OR_DEPARTMENT_OTHER): Payer: Medicare Other | Admitting: Internal Medicine

## 2021-05-21 VITALS — BP 158/58 | HR 118 | Temp 97.0°F | Resp 17 | Wt 211.2 lb

## 2021-05-21 DIAGNOSIS — C9110 Chronic lymphocytic leukemia of B-cell type not having achieved remission: Secondary | ICD-10-CM

## 2021-05-21 DIAGNOSIS — Z79899 Other long term (current) drug therapy: Secondary | ICD-10-CM | POA: Diagnosis not present

## 2021-05-21 DIAGNOSIS — Z794 Long term (current) use of insulin: Secondary | ICD-10-CM | POA: Diagnosis not present

## 2021-05-21 DIAGNOSIS — I10 Essential (primary) hypertension: Secondary | ICD-10-CM | POA: Diagnosis not present

## 2021-05-21 DIAGNOSIS — Z7982 Long term (current) use of aspirin: Secondary | ICD-10-CM | POA: Diagnosis not present

## 2021-05-21 DIAGNOSIS — E119 Type 2 diabetes mellitus without complications: Secondary | ICD-10-CM | POA: Diagnosis not present

## 2021-05-21 DIAGNOSIS — Z7951 Long term (current) use of inhaled steroids: Secondary | ICD-10-CM | POA: Diagnosis not present

## 2021-05-21 DIAGNOSIS — J449 Chronic obstructive pulmonary disease, unspecified: Secondary | ICD-10-CM | POA: Diagnosis not present

## 2021-05-21 NOTE — Progress Notes (Signed)
[facility] Telephone:(336) [phone]   Fax:(336) [phone]  OFFICE PROGRESS NOTE  Larry Coffin, MD [place] [address]  DIAGNOSIS: CLL/small lymphocytic lymphoma diagnosed in 2010 and has been in observation for several years with no treatment.  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  The cytogenetics were negative for P 17.    PRIOR THERAPY: None   CURRENT THERAPY: Obinutuzumab 1,000 mg IV. First dose on 08/10/2019. He will also begin acalabrutinib 100 mg BID starting on 08/19/2019.  Status post 20 cycles.  Obinutuzumab was discontinued after cycle #12 and the patient is currently on the oral acalabrutinib 100 mg p.o. twice daily.  INTERVAL HISTORY: Larry Lam 50 y.o. male returns to the clinic today for follow-up visit.  The patient is feeling fine today with no concerning complaints but he is recovering from some cold symptoms.  He denied having any current chest pain, shortness of breath, cough or hemoptysis.  He has no nausea, vomiting, diarrhea or constipation.  He has no headache or visual changes.  He has no fever or chills.  He has been tolerating his treatment with acalabrutinib fairly well.  The patient is here today for evaluation with repeat CT scan of the chest, abdomen pelvis for restaging of his disease.   MEDICAL HISTORY: Past Medical History:  Diagnosis Date   Asthma    cll dx'd 04/2019   COPD (chronic obstructive pulmonary disease) (HCC)    Diabetes mellitus without complication ([place])    Hypertension    Seizures ([place])    3-4 years ago. Single episode.   Tobacco use     ALLERGIES:  is allergic to amlodipine besylate-valsartan and hydrochlorothiazide.  MEDICATIONS:  Current Outpatient Medications  Medication Sig Dispense Refill   acalabrutinib (CALQUENCE) 100 MG capsule TAKE 1 CAPSULE (100 MG TOTAL) BY MOUTH 2 (TWO) TIMES DAILY. 60 capsule 2   albuterol  (PROVENTIL) (2.5 MG/3ML) 0.083% nebulizer solution Take 1 mL by nebulization as needed. Every 4-6 hours as needed     amLODipine (NORVASC) 10 MG tablet Take 10 mg by mouth daily.     ASPIRIN 81 PO Take 81 mg by mouth daily.      atorvastatin (LIPITOR) 40 MG tablet Take 40 mg by mouth at bedtime.     carvedilol (COREG) 12.5 MG tablet Take 12.5 mg by mouth 2 (two) times daily.     EASY TOUCH PEN NEEDLES 31G X 6 MM MISC Inject 1 application into the skin daily.     escitalopram (LEXAPRO) 20 MG tablet Take 20 mg by mouth daily.     fluticasone furoate-vilanterol (BREO ELLIPTA) 200-25 MCG/INH AEPB Inhale 1 puff into the lungs daily. 28 each 1   HUMALOG KWIKPEN 100 UNIT/ML KwikPen Inject 15 Units into the skin as directed.     INCRUSE ELLIPTA 62.5 MCG/INH AEPB 1 puff daily.     Insulin Detemir (LEVEMIR) 100 UNIT/ML Pen Inject 30 Units into the skin daily. (Patient taking differently: Inject 46 Units into the skin daily. ) 18 mL 0   Insulin Pen Needle (PEN NEEDLES 3/16") 31G X 5 MM MISC Use as directed with insulin pen 100 each 0   ipratropium-albuterol (DUONEB) 0.5-2.5 (3) MG/3ML SOLN Take 3 mLs by nebulization every 6 (six) hours as needed. J44.9 and J44.1 360 mL 0   lisinopril (ZESTRIL) 40 MG tablet Take 40 mg by mouth daily.     metFORMIN (  GLUCOPHAGE) 1000 MG tablet Take 1,000 mg by mouth 2 (two) times daily.     metoprolol succinate (TOPROL-XL) 25 MG 24 hr tablet Take 25 mg by mouth daily.     NOVOLOG FLEXPEN 100 UNIT/ML FlexPen Inject into the skin as directed.      pantoprazole (PROTONIX) 40 MG tablet Take 1 tablet (40 mg total) by mouth daily. 30 tablet 11   VENTOLIN HFA 108 (90 Base) MCG/ACT inhaler Inhale 1-2 puffs into the lungs as directed.     vitamin B-12 (CYANOCOBALAMIN) 1000 MCG tablet Take 1 tablet (1,000 mcg total) by mouth daily. 90 tablet 0   No current facility-administered medications for this visit.    SURGICAL HISTORY: No past surgical history on file.  REVIEW OF SYSTEMS:   Constitutional: negative Eyes: negative Ears, nose, mouth, throat, and face: negative Respiratory: negative Cardiovascular: negative Gastrointestinal: negative Genitourinary:negative Integument/breast: negative Hematologic/lymphatic: negative Musculoskeletal:negative Neurological: negative Behavioral/Psych: negative Endocrine: negative Allergic/Immunologic: negative   PHYSICAL EXAMINATION: General appearance: alert, cooperative, and no distress Head: Normocephalic, without obvious abnormality, atraumatic Neck: no adenopathy, no JVD, supple, symmetrical, trachea midline, and thyroid not enlarged, symmetric, no tenderness/mass/nodules Lymph nodes: Cervical, supraclavicular, and axillary nodes normal. Resp: clear to auscultation bilaterally Back: symmetric, no curvature. ROM normal. No CVA tenderness. Cardio: regular rate and rhythm, S1, S2 normal, no murmur, click, rub or gallop GI: soft, non-tender; bowel sounds normal; no masses,  no organomegaly Extremities: extremities normal, atraumatic, no cyanosis or edema Neurologic: Alert and oriented X 3, normal strength and tone. Normal symmetric reflexes. Normal coordination and gait  ECOG PERFORMANCE STATUS: 1 - Symptomatic but completely ambulatory  Blood pressure (!) 158/58, pulse (!) 118, temperature (!) 97 F (36.1 C), temperature source Oral, resp. rate 17, weight 211 lb 3 oz (95.8 kg), SpO2 95 %.  LABORATORY DATA: Lab Results  Component Value Date   WBC 8.7 05/15/2021   HGB 13.7 05/15/2021   HCT 44.0 05/15/2021   MCV 92.2 05/15/2021   PLT 218 05/15/2021      Chemistry      Component Value Date/Time   NA 139 05/15/2021 0807   NA 137 07/06/2012 0506   K 5.5 (H) 05/15/2021 0807   K 4.0 07/06/2012 0506   CL 102 05/15/2021 0807   CL 104 07/06/2012 0506   CO2 28 05/15/2021 0807   CO2 25 07/06/2012 0506   BUN 20 05/15/2021 0807   BUN 12 07/06/2012 0506   CREATININE 1.36 (H) 05/15/2021 0807   CREATININE 0.62 07/06/2012  0506      Component Value Date/Time   CALCIUM 10.2 05/15/2021 0807   CALCIUM 8.6 07/06/2012 0506   ALKPHOS 108 05/15/2021 0807   ALKPHOS 96 07/06/2012 0506   AST 13 (L) 05/15/2021 0807   ALT 17 05/15/2021 0807   ALT 30 07/06/2012 0506   BILITOT 0.3 05/15/2021 0807       RADIOGRAPHIC STUDIES: CT Chest W Contrast  Addendum Date: 05/21/2021   ADDENDUM REPORT: 05/21/2021 09:11 ADDENDUM: Corrected report: Original report was generated with an error, corrected as follows: "Uterus and bilateral adnexa are unremarkable" should be replaced with "prostate is unremarkable." Electronically Signed   By: Yetta Glassman M.D.   On: 05/21/2021 09:11   Result Date: 05/21/2021 CLINICAL DATA:  History of CLL EXAM: CT CHEST, ABDOMEN, AND PELVIS WITH CONTRAST TECHNIQUE: Multidetector CT imaging of the chest, abdomen and pelvis was performed following the standard protocol during bolus administration of intravenous contrast. CONTRAST:  54mL OMNIPAQUE IOHEXOL 350 MG/ML SOLN  COMPARISON:  None. CT chest, abdomen pelvis dated October 16, 2020 FINDINGS: CT CHEST FINDINGS Cardiovascular: Normal heart size. No pericardial effusion. No significant coronary artery calcifications periodic sclerotic disease of thoracic aorta. Mediastinum/Nodes: No pathologically enlarged lymph nodes seen in the chest. Esophagus is unremarkable. Lungs/Pleura: Central airways are patent. New centrilobular bilateral nodular and ground-glass opacities are seen throughout the lungs. New reference solid nodule of the left lower lobe measuring 5 mm on series 4, image 114. New solid nodule at the left upper lobe measuring up to 8 mm on image 48. Previously described clustered nodules of the left upper lobe have resolved. Musculoskeletal: No chest wall mass or suspicious bone lesions identified. CT ABDOMEN PELVIS FINDINGS Hepatobiliary: No focal liver abnormality is seen. No gallstones, gallbladder wall thickening, or biliary dilatation. Pancreas:  Unremarkable. No pancreatic ductal dilatation or surrounding inflammatory changes. Spleen: Normal in size without focal abnormality. Adrenals/Urinary Tract: Adrenal glands are unremarkable. Kidneys are normal, without renal calculi, focal lesion, or hydronephrosis. Bladder is unremarkable. Stomach/Bowel: Stomach is within normal limits. Appendix appears normal. No evidence of bowel wall thickening, distention, or inflammatory changes. Vascular/Lymphatic: Atherosclerotic disease of the thoracic aorta. No pathologically enlarged lymph nodes seen in the abdomen or pelvis. Reproductive: Uterus and bilateral adnexa are unremarkable. 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 No acute or significant osseous findings. IMPRESSION: No enlarged lymph nodes seen in the chest, abdomen or pelvis. Numerous new bilateral small solid and ground-glass nodular opacities are seen throughout the lungs, likely infectious or inflammatory. Recommend three-month follow-up chest CT to ensure resolution. Previously described clustered nodules of the left upper lobe have resolved. Aortic Atherosclerosis (ICD10-I70.0). Electronically Signed: By: Yetta Glassman M.D. On: 05/15/2021 15:10   CT Abdomen Pelvis W Contrast  Addendum Date: 05/21/2021   ADDENDUM REPORT: 05/21/2021 09:11 ADDENDUM: Corrected report: Original report was generated with an error, corrected as follows: "Uterus and bilateral adnexa are unremarkable" should be replaced with "prostate is unremarkable." Electronically Signed   By: Yetta Glassman M.D.   On: 05/21/2021 09:11   Result Date: 05/21/2021 CLINICAL DATA:  History of CLL EXAM: CT CHEST, ABDOMEN, AND PELVIS WITH CONTRAST TECHNIQUE: Multidetector CT imaging of the chest, abdomen and pelvis was performed following the standard protocol during bolus administration of intravenous contrast. CONTRAST:  2mL OMNIPAQUE IOHEXOL 350  MG/ML SOLN COMPARISON:  None. CT chest, abdomen pelvis dated October 16, 2020 FINDINGS: CT CHEST FINDINGS Cardiovascular: Normal heart size. No pericardial effusion. No significant coronary artery calcifications periodic sclerotic disease of thoracic aorta. Mediastinum/Nodes: No pathologically enlarged lymph nodes seen in the chest. Esophagus is unremarkable. Lungs/Pleura: Central airways are patent. New centrilobular bilateral nodular and ground-glass opacities are seen throughout the lungs. New reference solid nodule of the left lower lobe measuring 5 mm on series 4, image 114. New solid nodule at the left upper lobe measuring up to 8 mm on image 48. Previously described clustered nodules of the left upper lobe have resolved. Musculoskeletal: No chest wall mass or suspicious bone lesions identified. CT ABDOMEN PELVIS FINDINGS Hepatobiliary: No focal liver abnormality is seen. No gallstones, gallbladder wall thickening, or biliary dilatation. Pancreas: Unremarkable. No pancreatic ductal dilatation or surrounding inflammatory changes. Spleen: Normal in size without focal abnormality. Adrenals/Urinary Tract: Adrenal glands are unremarkable. Kidneys are normal, without renal calculi, focal lesion, or hydronephrosis. Bladder is unremarkable. Stomach/Bowel: Stomach is within normal limits. Appendix appears normal. No evidence of bowel wall thickening, distention, or inflammatory changes. Vascular/Lymphatic:  Atherosclerotic disease of the thoracic aorta. No pathologically enlarged lymph nodes seen in the abdomen or pelvis. Reproductive: Uterus and bilateral adnexa are unremarkable. 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 No acute or significant osseous findings. IMPRESSION: No enlarged lymph nodes seen in the chest, abdomen or pelvis. Numerous new bilateral small solid and ground-glass nodular opacities are seen throughout the  lungs, likely infectious or inflammatory. Recommend three-month follow-up chest CT to ensure resolution. Previously described clustered nodules of the left upper lobe have resolved. Aortic Atherosclerosis (ICD10-I70.0). Electronically Signed: By: Yetta Glassman M.D. On: 05/15/2021 15:10    ASSESSMENT AND PLAN: This is a very pleasant 50 years old white male with CLL/small lymphocytic lymphoma diagnosed in 2010 and has been in observation for several years but the patient had significant disease progression and August 2020. He is currently undergoing treatment with obinutuzumab every 4 weeks in addition to acalabrutinib 100 mg p.o. twice daily.  Status post 20 cycles. Obinutuzumab was discontinued after cycle #12.He is currently on treatment with acalabrutinib 100 mg p.o. twice daily. The patient has been tolerating his treatment with acalabrutinib fairly well with no concerning adverse effects. He had repeat CT scan of the chest, abdomen pelvis performed recently.  I personally and independently reviewed the scans and discussed the results with the patient today. His scan showed no concerning findings for disease recurrence or progression. I recommended for him to continue his current treatment with acalabrutinib with the same dose. I will see him back for follow-up visit in 1 months for evaluation and repeat blood work. He was advised to call immediately if he has any other concerning symptoms in the interval. The patient voices understanding of current disease status and treatment options and is in agreement with the current care plan. All questions were answered. The patient knows to call the clinic with any problems, questions or concerns. We can certainly see the patient much sooner if necessary.   Disclaimer: This note was dictated with voice recognition software. Similar sounding words can inadvertently be transcribed and may not be corrected upon review.

## 2021-05-24 ENCOUNTER — Other Ambulatory Visit (HOSPITAL_COMMUNITY): Payer: Self-pay

## 2021-05-28 ENCOUNTER — Other Ambulatory Visit: Payer: Self-pay | Admitting: Internal Medicine

## 2021-05-28 ENCOUNTER — Other Ambulatory Visit (HOSPITAL_COMMUNITY): Payer: Self-pay

## 2021-05-28 DIAGNOSIS — C9110 Chronic lymphocytic leukemia of B-cell type not having achieved remission: Secondary | ICD-10-CM

## 2021-05-29 ENCOUNTER — Other Ambulatory Visit (HOSPITAL_COMMUNITY): Payer: Self-pay

## 2021-05-29 ENCOUNTER — Encounter: Payer: Self-pay | Admitting: Oncology

## 2021-05-29 ENCOUNTER — Telehealth: Payer: Self-pay | Admitting: Pharmacist

## 2021-05-29 DIAGNOSIS — C9110 Chronic lymphocytic leukemia of B-cell type not having achieved remission: Secondary | ICD-10-CM

## 2021-05-29 MED ORDER — CALQUENCE 100 MG PO CAPS
ORAL_CAPSULE | Freq: Two times a day (BID) | ORAL | 2 refills | Status: DC
  Filled 2021-05-29: qty 60, 30d supply, fill #0

## 2021-05-29 MED ORDER — CALQUENCE 100 MG PO TABS
100.0000 mg | ORAL_TABLET | Freq: Two times a day (BID) | ORAL | 2 refills | Status: DC
  Filled 2021-05-29: qty 60, 30d supply, fill #0
  Filled 2021-07-02: qty 60, 30d supply, fill #1
  Filled 2021-08-03: qty 60, 30d supply, fill #2

## 2021-05-29 NOTE — Telephone Encounter (Signed)
Oral Chemotherapy Pharmacist Encounter    Dosage formulation change required for Calquence (acalabrutinib). Manufacturer is phasing out previous dosage forms of capsules and changing to tablet formulation. All future prescriptions sent will need to be tablet formulation due to inability to obtain capsules.    Patient's most recent prescription for Calquence (written 05/29/21 with 2 refills) has been switched to tablet formulation to be dispensed form the [facility].   Attempted to reach patient to inform patient about dosage form change. Left voicemail for call back.   Leron Croak, PharmD, BCPS Hematology/Oncology Clinical Pharmacist [facility] [phone] 05/29/2021 3:55 PM

## 2021-05-30 ENCOUNTER — Other Ambulatory Visit: Payer: Self-pay | Admitting: Medical Oncology

## 2021-05-30 ENCOUNTER — Telehealth: Payer: Self-pay

## 2021-05-30 ENCOUNTER — Telehealth: Payer: Self-pay | Admitting: Medical Oncology

## 2021-05-30 ENCOUNTER — Other Ambulatory Visit (HOSPITAL_COMMUNITY): Payer: Self-pay

## 2021-05-30 NOTE — Telephone Encounter (Signed)
05/29/21: Pt LM stating he is "still sick" afer being seen in the ER and given erythromycin. Pt request a call back.  05/29/21: I called the pt back and LVM in hope to obtain clarity on what he complains of.

## 2021-05-30 NOTE — Telephone Encounter (Signed)
   Per pt VM: Zpak started 09/29. He did not say for what and   He doesn't feel any better after starting zpak .   Would Mohamed call in another antibiotic ?  I LVM and instructed pt to finish ZPAK and to contact prescribing provider if he is not feeling any better. I will ask Dr. Julien Nordmann to advise

## 2021-06-04 ENCOUNTER — Other Ambulatory Visit (HOSPITAL_COMMUNITY): Payer: Self-pay

## 2021-06-25 ENCOUNTER — Inpatient Hospital Stay: Payer: Medicare Other | Attending: Internal Medicine

## 2021-06-25 ENCOUNTER — Inpatient Hospital Stay (HOSPITAL_BASED_OUTPATIENT_CLINIC_OR_DEPARTMENT_OTHER): Payer: Medicare Other | Admitting: Internal Medicine

## 2021-06-25 ENCOUNTER — Other Ambulatory Visit: Payer: Self-pay

## 2021-06-25 ENCOUNTER — Encounter: Payer: Self-pay | Admitting: Internal Medicine

## 2021-06-25 VITALS — BP 142/74 | HR 112 | Temp 98.6°F | Resp 20 | Ht 70.0 in | Wt 215.5 lb

## 2021-06-25 DIAGNOSIS — J449 Chronic obstructive pulmonary disease, unspecified: Secondary | ICD-10-CM | POA: Insufficient documentation

## 2021-06-25 DIAGNOSIS — C9110 Chronic lymphocytic leukemia of B-cell type not having achieved remission: Secondary | ICD-10-CM

## 2021-06-25 DIAGNOSIS — E119 Type 2 diabetes mellitus without complications: Secondary | ICD-10-CM | POA: Diagnosis not present

## 2021-06-25 DIAGNOSIS — Z7982 Long term (current) use of aspirin: Secondary | ICD-10-CM | POA: Diagnosis not present

## 2021-06-25 DIAGNOSIS — Z79899 Other long term (current) drug therapy: Secondary | ICD-10-CM | POA: Diagnosis not present

## 2021-06-25 DIAGNOSIS — Z7951 Long term (current) use of inhaled steroids: Secondary | ICD-10-CM | POA: Insufficient documentation

## 2021-06-25 DIAGNOSIS — I10 Essential (primary) hypertension: Secondary | ICD-10-CM | POA: Diagnosis not present

## 2021-06-25 DIAGNOSIS — Z794 Long term (current) use of insulin: Secondary | ICD-10-CM | POA: Insufficient documentation

## 2021-06-25 DIAGNOSIS — Z5111 Encounter for antineoplastic chemotherapy: Secondary | ICD-10-CM

## 2021-06-25 LAB — CBC WITH DIFFERENTIAL (CANCER CENTER ONLY)
Abs Immature Granulocytes: 0.06 10*3/uL (ref 0.00–0.07)
Basophils Absolute: 0.1 10*3/uL (ref 0.0–0.1)
Basophils Relative: 1 %
Eosinophils Absolute: 0.3 10*3/uL (ref 0.0–0.5)
Eosinophils Relative: 2 %
HCT: 45.2 % (ref 39.0–52.0)
Hemoglobin: 13.6 g/dL (ref 13.0–17.0)
Immature Granulocytes: 0 %
Lymphocytes Relative: 16 %
Lymphs Abs: 2.1 10*3/uL (ref 0.7–4.0)
MCH: 27.5 pg (ref 26.0–34.0)
MCHC: 30.1 g/dL (ref 30.0–36.0)
MCV: 91.3 fL (ref 80.0–100.0)
Monocytes Absolute: 1.1 10*3/uL — ABNORMAL HIGH (ref 0.1–1.0)
Monocytes Relative: 8 %
Neutro Abs: 9.8 10*3/uL — ABNORMAL HIGH (ref 1.7–7.7)
Neutrophils Relative %: 73 %
Platelet Count: 212 10*3/uL (ref 150–400)
RBC: 4.95 MIL/uL (ref 4.22–5.81)
RDW: 15.8 % — ABNORMAL HIGH (ref 11.5–15.5)
WBC Count: 13.4 10*3/uL — ABNORMAL HIGH (ref 4.0–10.5)
nRBC: 0 % (ref 0.0–0.2)

## 2021-06-25 LAB — URIC ACID: Uric Acid, Serum: 6.5 mg/dL (ref 3.7–8.6)

## 2021-06-25 LAB — CMP (CANCER CENTER ONLY)
ALT: 12 U/L (ref 0–44)
AST: 10 U/L — ABNORMAL LOW (ref 15–41)
Albumin: 3.5 g/dL (ref 3.5–5.0)
Alkaline Phosphatase: 106 U/L (ref 38–126)
Anion gap: 7 (ref 5–15)
BUN: 11 mg/dL (ref 6–20)
CO2: 30 mmol/L (ref 22–32)
Calcium: 8.7 mg/dL — ABNORMAL LOW (ref 8.9–10.3)
Chloride: 102 mmol/L (ref 98–111)
Creatinine: 1.39 mg/dL — ABNORMAL HIGH (ref 0.61–1.24)
GFR, Estimated: >60 mL/min (ref >60)
Glucose, Bld: 262 mg/dL — ABNORMAL HIGH (ref 70–99)
Potassium: 5.6 mmol/L — ABNORMAL HIGH (ref 3.5–5.1)
Sodium: 139 mmol/L (ref 135–145)
Total Bilirubin: 0.3 mg/dL (ref 0.3–1.2)
Total Protein: 6.4 g/dL — ABNORMAL LOW (ref 6.5–8.1)

## 2021-06-25 LAB — LACTATE DEHYDROGENASE: LDH: 120 U/L (ref 98–192)

## 2021-06-25 NOTE — Progress Notes (Signed)
[place] [phone]   [phone]) [phone]  OFFICE PROGRESS NOTE  [name] [id number]  DIAGNOSIS: CLL/small lymphocytic lymphoma diagnosed in 2010 and has been in observation for several years with no treatment.  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  The cytogenetics were negative for P 17.    PRIOR THERAPY: None   CURRENT THERAPY: Obinutuzumab 1,000 mg IV. First dose on 08/10/2019. He will also begin acalabrutinib 100 mg BID starting on 08/19/2019.  Status post 20 cycles.  Obinutuzumab was discontinued after cycle #12 and the patient is currently on the oral acalabrutinib 100 mg p.o. twice daily.  INTERVAL HISTORY: Larry Lam 50 y.o. male returns to the clinic today for follow-up visit.  The patient is feeling fine today with no concerning complaints except for mild cough.  He was seen recently at urgent care center and treated with Z-Pak initially for bronchitis with no improvement then he was treated with doxycycline and prednisone.  He denied having any current chest pain, shortness of breath or hemoptysis.  He denied having any fever or chills.  He has no nausea, vomiting, diarrhea or constipation.  He has no headache or visual changes.  He has no weight loss or night sweats.  He continues to tolerate his treatment with acalabrutinib fairly well.  The patient is here today for evaluation and repeat blood work.  MEDICAL HISTORY: Past Medical History:  Diagnosis Date   Asthma    cll dx'd 04/2019   COPD (chronic obstructive pulmonary disease) (HCC)    Diabetes mellitus without complication ([place])    Hypertension    Seizures ([place])    3-4 years ago. Single episode.   Tobacco use     ALLERGIES:  is allergic to amlodipine besylate-valsartan and hydrochlorothiazide.  MEDICATIONS:  Current Outpatient Medications  Medication Sig Dispense Refill    Acalabrutinib Maleate (CALQUENCE) 100 MG TABS Take 100 mg by mouth 2 (two) times daily. 60 tablet 2   albuterol (PROVENTIL) (2.5 MG/3ML) 0.083% nebulizer solution Take 1 mL by nebulization as needed. Every 4-6 hours as needed     amLODipine (NORVASC) 10 MG tablet Take 10 mg by mouth daily.     ASPIRIN 81 PO Take 81 mg by mouth daily.      atorvastatin (LIPITOR) 40 MG tablet Take 40 mg by mouth at bedtime.     azithromycin (ZITHROMAX) 250 MG tablet Take 250 mg by mouth as directed.     carvedilol (COREG) 12.5 MG tablet Take 12.5 mg by mouth 2 (two) times daily.     EASY TOUCH PEN NEEDLES 31G X 6 MM MISC Inject 1 application into the skin daily.     escitalopram (LEXAPRO) 20 MG tablet Take 20 mg by mouth daily.     fluticasone furoate-vilanterol (BREO ELLIPTA) 200-25 MCG/INH AEPB Inhale 1 puff into the lungs daily. 28 each 1   HUMALOG KWIKPEN 100 UNIT/ML KwikPen Inject 15 Units into the skin as directed.     INCRUSE ELLIPTA 62.5 MCG/INH AEPB 1 puff daily.     Insulin Detemir (LEVEMIR) 100 UNIT/ML Pen Inject 30 Units into the skin daily. (Patient taking differently: Inject 46 Units into the skin daily. ) 18 mL 0   Insulin Pen Needle (PEN NEEDLES 3/16") 31G X 5 MM MISC Use as directed with insulin pen 100 each 0   ipratropium-albuterol (DUONEB) 0.5-2.5 (3) MG/3ML SOLN  Take 3 mLs by nebulization every 6 (six) hours as needed. J44.9 and J44.1 360 mL 0   lisinopril (ZESTRIL) 40 MG tablet Take 40 mg by mouth daily.     metFORMIN (GLUCOPHAGE) 1000 MG tablet Take 1,000 mg by mouth 2 (two) times daily.     metoprolol succinate (TOPROL-XL) 25 MG 24 hr tablet Take 25 mg by mouth daily.     NOVOLOG FLEXPEN 100 UNIT/ML FlexPen Inject into the skin as directed.      pantoprazole (PROTONIX) 40 MG tablet Take 1 tablet (40 mg total) by mouth daily. 30 tablet 11   VENTOLIN HFA 108 (90 Base) MCG/ACT inhaler Inhale 1-2 puffs into the lungs as directed.     vitamin B-12 (CYANOCOBALAMIN) 1000 MCG tablet Take 1 tablet  (1,000 mcg total) by mouth daily. 90 tablet 0   No current facility-administered medications for this visit.    SURGICAL HISTORY: No past surgical history on file.  REVIEW OF SYSTEMS:  A comprehensive review of systems was negative except for: Respiratory: positive for cough   PHYSICAL EXAMINATION: General appearance: alert, cooperative, and no distress Head: Normocephalic, without obvious abnormality, atraumatic Neck: no adenopathy, no JVD, supple, symmetrical, trachea midline, and thyroid not enlarged, symmetric, no tenderness/mass/nodules Lymph nodes: Cervical, supraclavicular, and axillary nodes normal. Resp: clear to auscultation bilaterally Back: symmetric, no curvature. ROM normal. No CVA tenderness. Cardio: regular rate and rhythm, S1, S2 normal, no murmur, click, rub or gallop GI: soft, non-tender; bowel sounds normal; no masses,  no organomegaly Extremities: extremities normal, atraumatic, no cyanosis or edema  ECOG PERFORMANCE STATUS: 1 - Symptomatic but completely ambulatory  Blood pressure (!) 142/74, pulse (!) 112, temperature 98.6 F (37 C), temperature source Tympanic, resp. rate 20, height 5\' 10"  (1.778 m), weight 215 lb 8 oz (97.8 kg), SpO2 92 %.  LABORATORY DATA: Lab Results  Component Value Date   WBC 8.7 05/15/2021   HGB 13.7 05/15/2021   HCT 44.0 05/15/2021   MCV 92.2 05/15/2021   PLT 218 05/15/2021      Chemistry      Component Value Date/Time   NA 139 05/15/2021 0807   NA 137 07/06/2012 0506   K 5.5 (H) 05/15/2021 0807   K 4.0 07/06/2012 0506   CL 102 05/15/2021 0807   CL 104 07/06/2012 0506   CO2 28 05/15/2021 0807   CO2 25 07/06/2012 0506   BUN 20 05/15/2021 0807   BUN 12 07/06/2012 0506   CREATININE 1.36 (H) 05/15/2021 0807   CREATININE 0.62 07/06/2012 0506      Component Value Date/Time   CALCIUM 10.2 05/15/2021 0807   CALCIUM 8.6 07/06/2012 0506   ALKPHOS 108 05/15/2021 0807   ALKPHOS 96 07/06/2012 0506   AST 13 (L) 05/15/2021 0807    ALT 17 05/15/2021 0807   ALT 30 07/06/2012 0506   BILITOT 0.3 05/15/2021 0807       RADIOGRAPHIC STUDIES: No results found.  ASSESSMENT AND PLAN: This is a very pleasant 50 years old white male with CLL/small lymphocytic lymphoma diagnosed in 2010 and has been in observation for several years but the patient had significant disease progression and August 2020. He is currently undergoing treatment with obinutuzumab every 4 weeks in addition to acalabrutinib 100 mg p.o. twice daily.  Status post 21 cycles. Obinutuzumab was discontinued after cycle #12.He is currently on treatment with acalabrutinib 100 mg p.o. twice daily. He has been tolerating his treatment with acalabrutinib fairly well with no concerning adverse effects. The  patient is currently treated for bronchitis with doxycycline and prednisone.  This explain the elevation of his total white blood count today. I recommended for him to continue his current treatment with acalabrutinib with the same dose. He was advised to go to the emergency department immediately if he has any worsening symptoms of his respiratory tract or significant fever or chills. For the tachycardia, he drinks a lot of caffeine and this may be a contributing factor.  I advised him to cut down on his caffeine drinks. The patient will come back for follow-up visit in 1 months for evaluation and repeat blood work. He was advised to call immediately if he has any other concerning symptoms in the interval. The patient voices understanding of current disease status and treatment options and is in agreement with the current care plan. All questions were answered. The patient knows to call the clinic with any problems, questions or concerns. We can certainly see the patient much sooner if necessary.   Disclaimer: This note was dictated with voice recognition software. Similar sounding words can inadvertently be transcribed and may not be corrected upon review.

## 2021-06-28 ENCOUNTER — Other Ambulatory Visit (HOSPITAL_COMMUNITY): Payer: Self-pay

## 2021-07-02 ENCOUNTER — Other Ambulatory Visit (HOSPITAL_COMMUNITY): Payer: Self-pay

## 2021-07-03 ENCOUNTER — Other Ambulatory Visit (HOSPITAL_COMMUNITY): Payer: Self-pay

## 2021-07-05 ENCOUNTER — Other Ambulatory Visit (HOSPITAL_COMMUNITY): Payer: Self-pay

## 2021-08-03 ENCOUNTER — Other Ambulatory Visit (HOSPITAL_COMMUNITY): Payer: Self-pay

## 2021-09-04 ENCOUNTER — Other Ambulatory Visit (HOSPITAL_COMMUNITY): Payer: Self-pay

## 2021-09-04 ENCOUNTER — Other Ambulatory Visit: Payer: Self-pay | Admitting: Internal Medicine

## 2021-09-04 DIAGNOSIS — C9110 Chronic lymphocytic leukemia of B-cell type not having achieved remission: Secondary | ICD-10-CM

## 2021-09-04 MED ORDER — CALQUENCE 100 MG PO TABS
100.0000 mg | ORAL_TABLET | Freq: Two times a day (BID) | ORAL | 2 refills | Status: AC
  Filled 2021-09-12: qty 60, 30d supply, fill #0

## 2021-09-12 ENCOUNTER — Other Ambulatory Visit (HOSPITAL_COMMUNITY): Payer: Self-pay

## 2021-10-04 ENCOUNTER — Other Ambulatory Visit (HOSPITAL_COMMUNITY): Payer: Self-pay

## 2021-10-08 ENCOUNTER — Other Ambulatory Visit (HOSPITAL_COMMUNITY): Payer: Self-pay

## 2021-10-12 ENCOUNTER — Other Ambulatory Visit (HOSPITAL_COMMUNITY): Payer: Self-pay

## 2021-10-17 ENCOUNTER — Other Ambulatory Visit (HOSPITAL_COMMUNITY): Payer: Self-pay

## 2021-10-24 DEATH — deceased

## 2021-12-20 ENCOUNTER — Other Ambulatory Visit: Payer: Self-pay | Admitting: Physician Assistant

## 2021-12-20 NOTE — Progress Notes (Signed)
The patient is deceased as of October 14, 2021 per review of records.  ?

## 2022-08-29 ENCOUNTER — Other Ambulatory Visit (HOSPITAL_COMMUNITY): Payer: Self-pay

## 2022-09-06 ENCOUNTER — Other Ambulatory Visit (HOSPITAL_COMMUNITY): Payer: Self-pay
